# Patient Record
Sex: Female | Born: 1945
Health system: Southern US, Community
[De-identification: ages and names within clinical notes are randomized; demographics above are authoritative.]

## PROBLEM LIST (undated history)

## (undated) DIAGNOSIS — I10 Essential (primary) hypertension: Secondary | ICD-10-CM

## (undated) DIAGNOSIS — I2510 Atherosclerotic heart disease of native coronary artery without angina pectoris: Secondary | ICD-10-CM

## (undated) DIAGNOSIS — E785 Hyperlipidemia, unspecified: Secondary | ICD-10-CM

---

## 2013-09-20 DIAGNOSIS — J019 Acute sinusitis, unspecified: Secondary | ICD-10-CM | POA: Diagnosis not present

## 2014-11-05 DIAGNOSIS — Z08 Encounter for follow-up examination after completed treatment for malignant neoplasm: Secondary | ICD-10-CM | POA: Diagnosis not present

## 2014-11-05 DIAGNOSIS — Z85828 Personal history of other malignant neoplasm of skin: Secondary | ICD-10-CM | POA: Diagnosis not present

## 2015-05-12 DIAGNOSIS — H25043 Posterior subcapsular polar age-related cataract, bilateral: Secondary | ICD-10-CM | POA: Diagnosis not present

## 2019-11-11 DIAGNOSIS — M109 Gout, unspecified: Secondary | ICD-10-CM | POA: Diagnosis not present

## 2020-07-16 ENCOUNTER — Emergency Department (HOSPITAL_COMMUNITY): Payer: Medicare HMO

## 2020-07-16 ENCOUNTER — Other Ambulatory Visit: Payer: Self-pay

## 2020-07-16 ENCOUNTER — Inpatient Hospital Stay (HOSPITAL_COMMUNITY)
Admission: EM | Admit: 2020-07-16 | Discharge: 2020-07-25 | DRG: 232 | Disposition: A | Discharge status: Home / Self Care | Payer: Medicare HMO | Attending: Surgery | Admitting: Surgery

## 2020-07-16 ENCOUNTER — Encounter (HOSPITAL_COMMUNITY): Payer: Self-pay | Admitting: Emergency Medicine

## 2020-07-16 DIAGNOSIS — I248 Other forms of acute ischemic heart disease: Secondary | ICD-10-CM | POA: Diagnosis not present

## 2020-07-16 DIAGNOSIS — Z8249 Family history of ischemic heart disease and other diseases of the circulatory system: Secondary | ICD-10-CM

## 2020-07-16 DIAGNOSIS — E7800 Pure hypercholesterolemia, unspecified: Secondary | ICD-10-CM | POA: Diagnosis present

## 2020-07-16 DIAGNOSIS — Z4682 Encounter for fitting and adjustment of non-vascular catheter: Secondary | ICD-10-CM | POA: Diagnosis not present

## 2020-07-16 DIAGNOSIS — I2109 ST elevation (STEMI) myocardial infarction involving other coronary artery of anterior wall: Secondary | ICD-10-CM | POA: Diagnosis not present

## 2020-07-16 DIAGNOSIS — D62 Acute posthemorrhagic anemia: Secondary | ICD-10-CM | POA: Diagnosis not present

## 2020-07-16 DIAGNOSIS — I25118 Atherosclerotic heart disease of native coronary artery with other forms of angina pectoris: Secondary | ICD-10-CM | POA: Diagnosis present

## 2020-07-16 DIAGNOSIS — E8770 Fluid overload, unspecified: Secondary | ICD-10-CM | POA: Diagnosis not present

## 2020-07-16 DIAGNOSIS — I10 Essential (primary) hypertension: Secondary | ICD-10-CM | POA: Diagnosis not present

## 2020-07-16 DIAGNOSIS — E785 Hyperlipidemia, unspecified: Secondary | ICD-10-CM | POA: Diagnosis not present

## 2020-07-16 DIAGNOSIS — R079 Chest pain, unspecified: Secondary | ICD-10-CM | POA: Diagnosis not present

## 2020-07-16 DIAGNOSIS — I2510 Atherosclerotic heart disease of native coronary artery without angina pectoris: Secondary | ICD-10-CM | POA: Diagnosis not present

## 2020-07-16 DIAGNOSIS — E119 Type 2 diabetes mellitus without complications: Secondary | ICD-10-CM | POA: Diagnosis not present

## 2020-07-16 DIAGNOSIS — D72829 Elevated white blood cell count, unspecified: Secondary | ICD-10-CM | POA: Diagnosis not present

## 2020-07-16 DIAGNOSIS — Z951 Presence of aortocoronary bypass graft: Secondary | ICD-10-CM | POA: Diagnosis not present

## 2020-07-16 DIAGNOSIS — E7801 Familial hypercholesterolemia: Secondary | ICD-10-CM | POA: Diagnosis not present

## 2020-07-16 DIAGNOSIS — Z20822 Contact with and (suspected) exposure to covid-19: Secondary | ICD-10-CM | POA: Diagnosis present

## 2020-07-16 DIAGNOSIS — I214 Non-ST elevation (NSTEMI) myocardial infarction: Secondary | ICD-10-CM

## 2020-07-16 DIAGNOSIS — R0789 Other chest pain: Secondary | ICD-10-CM | POA: Diagnosis not present

## 2020-07-16 DIAGNOSIS — J90 Pleural effusion, not elsewhere classified: Secondary | ICD-10-CM | POA: Diagnosis not present

## 2020-07-16 DIAGNOSIS — J9811 Atelectasis: Secondary | ICD-10-CM | POA: Diagnosis not present

## 2020-07-16 DIAGNOSIS — I25119 Atherosclerotic heart disease of native coronary artery with unspecified angina pectoris: Secondary | ICD-10-CM | POA: Diagnosis not present

## 2020-07-16 DIAGNOSIS — I088 Other rheumatic multiple valve diseases: Secondary | ICD-10-CM | POA: Diagnosis not present

## 2020-07-16 DIAGNOSIS — I517 Cardiomegaly: Secondary | ICD-10-CM | POA: Diagnosis not present

## 2020-07-16 DIAGNOSIS — I959 Hypotension, unspecified: Secondary | ICD-10-CM | POA: Diagnosis not present

## 2020-07-16 DIAGNOSIS — I25110 Atherosclerotic heart disease of native coronary artery with unstable angina pectoris: Secondary | ICD-10-CM | POA: Diagnosis not present

## 2020-07-16 DIAGNOSIS — E781 Pure hyperglyceridemia: Secondary | ICD-10-CM | POA: Diagnosis present

## 2020-07-16 DIAGNOSIS — Z09 Encounter for follow-up examination after completed treatment for conditions other than malignant neoplasm: Secondary | ICD-10-CM

## 2020-07-16 DIAGNOSIS — I2102 ST elevation (STEMI) myocardial infarction involving left anterior descending coronary artery: Secondary | ICD-10-CM | POA: Diagnosis not present

## 2020-07-16 HISTORY — DX: Hyperlipidemia, unspecified: E78.5

## 2020-07-16 HISTORY — DX: Non-ST elevation (NSTEMI) myocardial infarction: I21.4

## 2020-07-16 LAB — COMPREHENSIVE METABOLIC PANEL
ALT: 30 U/L (ref 0–44)
AST: 30 U/L (ref 15–41)
Albumin: 3.7 g/dL (ref 3.5–5.0)
Alkaline Phosphatase: 117 U/L (ref 38–126)
Anion gap: 11 (ref 5–15)
BUN: 12 mg/dL (ref 8–23)
CO2: 21 mmol/L — ABNORMAL LOW (ref 22–32)
Calcium: 8.9 mg/dL (ref 8.9–10.3)
Chloride: 108 mmol/L (ref 98–111)
Creatinine, Ser: 0.94 mg/dL (ref 0.44–1.00)
GFR, Estimated: >60 mL/min (ref >60)
Glucose, Bld: 98 mg/dL (ref 70–99)
Potassium: 3.9 mmol/L (ref 3.5–5.1)
Sodium: 140 mmol/L (ref 135–145)
Total Bilirubin: 0.7 mg/dL (ref 0.3–1.2)
Total Protein: 7.2 g/dL (ref 6.5–8.1)

## 2020-07-16 LAB — TROPONIN I (HIGH SENSITIVITY)
Troponin I (High Sensitivity): 136 ng/L (ref <18)
Troponin I (High Sensitivity): 227 ng/L (ref <18)

## 2020-07-16 LAB — CBC WITH DIFFERENTIAL/PLATELET
Abs Immature Granulocytes: 0.02 10*3/uL (ref 0.00–0.07)
Basophils Absolute: 0 10*3/uL (ref 0.0–0.1)
Basophils Relative: 0 %
Eosinophils Absolute: 0.1 10*3/uL (ref 0.0–0.5)
Eosinophils Relative: 1 %
HCT: 42.9 % (ref 36.0–46.0)
Hemoglobin: 14.5 g/dL (ref 12.0–15.0)
Immature Granulocytes: 0 %
Lymphocytes Relative: 16 %
Lymphs Abs: 1.6 10*3/uL (ref 0.7–4.0)
MCH: 30.2 pg (ref 26.0–34.0)
MCHC: 33.8 g/dL (ref 30.0–36.0)
MCV: 89.4 fL (ref 80.0–100.0)
Monocytes Absolute: 0.6 10*3/uL (ref 0.1–1.0)
Monocytes Relative: 6 %
Neutro Abs: 7.2 10*3/uL (ref 1.7–7.7)
Neutrophils Relative %: 77 %
Platelets: 252 10*3/uL (ref 150–400)
RBC: 4.8 MIL/uL (ref 3.87–5.11)
RDW: 12.9 % (ref 11.5–15.5)
WBC: 9.5 10*3/uL (ref 4.0–10.5)
nRBC: 0 % (ref 0.0–0.2)

## 2020-07-16 LAB — RESP PANEL BY RT-PCR (FLU A&B, COVID) ARPGX2
Influenza A by PCR: NEGATIVE
Influenza B by PCR: NEGATIVE
SARS Coronavirus 2 by RT PCR: NEGATIVE

## 2020-07-16 MED ORDER — NITROGLYCERIN 0.4 MG SL SUBL
0.4000 mg | SUBLINGUAL_TABLET | SUBLINGUAL | Status: DC | PRN
  Administered 2020-07-17 – 2020-07-18 (×6): 0.4 mg via SUBLINGUAL
  Filled 2020-07-16 (×3): qty 1

## 2020-07-16 MED ORDER — NITROGLYCERIN 0.4 MG SL SUBL
0.4000 mg | SUBLINGUAL_TABLET | Freq: Once | SUBLINGUAL | Status: AC
  Administered 2020-07-16: 20:00:00 0.4 mg via SUBLINGUAL
  Filled 2020-07-16: qty 1

## 2020-07-16 MED ORDER — HEPARIN (PORCINE) 25000 UT/250ML-% IV SOLN
900.0000 [IU]/h | INTRAVENOUS | Status: DC
  Administered 2020-07-16 – 2020-07-17 (×2): 900 [IU]/h via INTRAVENOUS
  Filled 2020-07-16 (×2): qty 250

## 2020-07-16 MED ORDER — ACETAMINOPHEN 325 MG PO TABS
650.0000 mg | ORAL_TABLET | ORAL | Status: DC | PRN
  Administered 2020-07-17 (×2): 650 mg via ORAL
  Filled 2020-07-16 (×3): qty 2

## 2020-07-16 MED ORDER — HEPARIN BOLUS VIA INFUSION
4000.0000 [IU] | Freq: Once | INTRAVENOUS | Status: AC
  Administered 2020-07-16: 21:00:00 4000 [IU] via INTRAVENOUS
  Filled 2020-07-16: qty 4000

## 2020-07-16 MED ORDER — METOPROLOL TARTRATE 25 MG PO TABS
25.0000 mg | ORAL_TABLET | Freq: Two times a day (BID) | ORAL | Status: DC
  Administered 2020-07-17 – 2020-07-18 (×3): 25 mg via ORAL
  Filled 2020-07-16 (×4): qty 1

## 2020-07-16 MED ORDER — NITROGLYCERIN 2 % TD OINT
1.0000 [in_us] | TOPICAL_OINTMENT | Freq: Once | TRANSDERMAL | Status: AC
  Administered 2020-07-16: 18:00:00 1 [in_us] via TOPICAL
  Filled 2020-07-16: qty 1

## 2020-07-16 MED ORDER — ATORVASTATIN CALCIUM 80 MG PO TABS
80.0000 mg | ORAL_TABLET | Freq: Every day | ORAL | Status: DC
  Administered 2020-07-16 – 2020-07-25 (×10): 80 mg via ORAL
  Filled 2020-07-16: qty 1
  Filled 2020-07-16: qty 8
  Filled 2020-07-16 (×8): qty 1

## 2020-07-16 MED ORDER — ONDANSETRON HCL 4 MG/2ML IJ SOLN: 4.0000 mg | Freq: Four times a day (QID) | INTRAMUSCULAR | Status: DC | PRN

## 2020-07-16 MED ORDER — ASPIRIN EC 81 MG PO TBEC
81.0000 mg | DELAYED_RELEASE_TABLET | Freq: Every day | ORAL | Status: DC
  Administered 2020-07-17: 10:00:00 81 mg via ORAL
  Filled 2020-07-16: qty 1

## 2020-07-16 NOTE — ED Provider Notes (Signed)
[facility] EMERGENCY DEPARTMENT Provider Note   CSN: [id number] Arrival date & time: 07/16/20  1647     History Chief Complaint  Patient presents with  . Chest Pain    Brenda Wilkins is a 74 y.o. female who presents to the ED today via EMS with complaint of sudden onset, intermittent, left sided chest pain x 2-3 days with persistent chest pain that began around 2 PM today.  Patient reports that for the last 2 to 3 days she has noticed left-sided chest pain with exertion.  She states she went for a walk 2 to 3 days ago and was having some chest pain that dissipated minutes after she stopped walking.  Has pain has become more persistent with exertion however she did not think much of it until today.  She states that while at the grocery store she began having some chest pain and then while taking her groceries to her house the chest pain worsened.  She states that she did feel short of breath when the chest pain for started and had an episode of dry heaving prompting her to call her daughter who then called 911.  She also states she took 2 aspirin prior to EMS getting to her house.  She is unsure if they were baby aspirin or full drinks aspirin.  She was given 2 sublingual nitroglycerin with this and her pain improved to about a 1 out of 10.  Patient has never had issues with chest pain in the past.  She does report that her father passed away of an MI at the age of 42.  Patient is a never smoker.  She denies any history of DVT/PE.  No recent prolonged travel or immobilization.  No hemoptysis.  No active malignancy.  No exogenous hormone use.  Without any other complaints at this time.   The history is provided by the patient, medical records and the EMS personnel.       History reviewed. No pertinent past medical history.  There are no problems to display for this patient.    OB History   No obstetric history on file.     No family history on file.  Social History    Tobacco Use  . Smoking status: Never Smoker  . Smokeless tobacco: Never Used  Substance Use Topics  . Alcohol use: Never  . Drug use: Never    Home Medications Prior to Admission medications   Not on File    Allergies    Patient has no allergy information on record.  Review of Systems   Review of Systems  Constitutional: Negative for chills and fever.  Respiratory: Positive for shortness of breath. Negative for cough.   Cardiovascular: Positive for chest pain. Negative for palpitations and leg swelling.  Gastrointestinal: Positive for nausea.  All other systems reviewed and are negative.   Physical Exam Updated Vital Signs BP (!) 148/73 (BP Location: Right Arm)   Pulse 80   Temp 98.2 F (36.8 C) (Oral)   Resp 17   SpO2 98%   Physical Exam Vitals and nursing note reviewed.  Constitutional:      Appearance: She is not ill-appearing or diaphoretic.  HENT:     Head: Normocephalic and atraumatic.  Eyes:     Conjunctiva/sclera: Conjunctivae normal.  Cardiovascular:     Rate and Rhythm: Normal rate and regular rhythm.     Pulses:          Radial pulses are 2+ on the  right side and 2+ on the left side.       Dorsalis pedis pulses are 2+ on the right side and 2+ on the left side.  Pulmonary:     Effort: Pulmonary effort is normal.     Breath sounds: Normal breath sounds. No decreased breath sounds, wheezing, rhonchi or rales.  Chest:     Chest wall: No tenderness.  Abdominal:     Palpations: Abdomen is soft.     Tenderness: There is no abdominal tenderness. There is no guarding or rebound.  Musculoskeletal:     Cervical back: Neck supple.     Right lower leg: No edema.     Left lower leg: No edema.  Skin:    General: Skin is warm and dry.  Neurological:     Mental Status: She is alert.     ED Results / Procedures / Treatments   Labs (all labs ordered are listed, but only abnormal results are displayed) Labs Reviewed  COMPREHENSIVE METABOLIC PANEL -  Abnormal; Notable for the following components:      Result Value   CO2 21 (*)    All other components within normal limits  TROPONIN I (HIGH SENSITIVITY) - Abnormal; Notable for the following components:   Troponin I (High Sensitivity) 136 (*)    All other components within normal limits  RESP PANEL BY RT-PCR (FLU A&B, COVID) ARPGX2  CBC WITH DIFFERENTIAL/PLATELET  URINALYSIS, ROUTINE W REFLEX MICROSCOPIC  TROPONIN I (HIGH SENSITIVITY)    EKG EKG Interpretation  Date/Time:  Saturday July 16 2020 19:32:49 EST Ventricular Rate:  87 PR Interval:    QRS Duration: 83 QT Interval:  369 QTC Calculation: 444 R Axis:   -49 Text Interpretation: Sinus rhythm Probable left atrial enlargement Left anterior fascicular block Low voltage, precordial leads RSR' in V1 or V2, right VCD or RVH Consider anterior infarct No significant change since last tracing Confirmed by Gwyneth Sprout ([id number]) on 07/16/2020 7:37:05 PM   Radiology DG Chest Port 1 View  Result Date: 07/16/2020 CLINICAL DATA:  Chest pain. EXAM: PORTABLE CHEST 1 VIEW COMPARISON:  None. FINDINGS: The cardiomediastinal contours are normal. There is mild interstitial coarsening of unknown chronicity. Pulmonary vasculature is normal. No consolidation, pleural effusion, or pneumothorax. No acute osseous abnormalities are seen. IMPRESSION: Mild interstitial coarsening of unknown chronicity. This may be chronic or represent atypical infection. Electronically Signed   By: Narda Rutherford M.D.   On: 07/16/2020 17:58    Procedures .Critical Care Performed by: Tanda Rockers, PA-C Authorized by: Tanda Rockers, PA-C   Critical care provider statement:    Critical care time (minutes):  40   Critical care was necessary to treat or prevent imminent or life-threatening deterioration of the following conditions:  Cardiac failure   Critical care was time spent personally by me on the following activities:  Discussions with consultants,  evaluation of patient's response to treatment, examination of patient, ordering and performing treatments and interventions, ordering and review of laboratory studies, ordering and review of radiographic studies, pulse oximetry, re-evaluation of patient's condition, obtaining history from patient or surrogate and review of old charts   (including critical care time)  Medications Ordered in ED Medications  nitroGLYCERIN (NITROGLYN) 2 % ointment 1 inch (1 inch Topical Given 07/16/20 1811)  nitroGLYCERIN (NITROSTAT) SL tablet 0.4 mg (0.4 mg Sublingual Given 07/16/20 1951)    ED Course  I have reviewed the triage vital signs and the nursing notes.  Pertinent labs & imaging results that were  available during my care of the patient were reviewed by me and considered in my medical decision making (see chart for details).  Clinical Course as of 07/16/20 1953  Sat Jul 16, 2020  1930 Troponin I (High Sensitivity)(!!): 136 [MV]    Clinical Course User Index [MV] Tanda Rockers, [place]   MDM Rules/Calculators/A&P                          74 year old female presents to the ED via EMS for intermittent left-sided chest pain for the past 2 to 3 days with exertion, persistent since 2 PM today.  It took 2 aspirin at home without relief, she is unsure if they were baby aspirin or full-strength aspirin.  She was also given 2 sublingual nitroglycerin with relief from an 8 out of 10 to about a 1 out of 10.  No previous history of CAD.  Patient does report family history of CAD with her father passing away at the age of 70.  On arrival to the ED vitals are stable.  Patient is afebrile, nontachycardic nontachypneic and appears to be in no acute distress however she does have a concerning story at this time..  She is still having some pain will apply Nitropaste at this time.  She did not present with an EKG with EMS however an EKG was obtained here which does show some T wave inversions.  We unfortunately do not have  a previous to compare to.  Will work-up for ACS at this time with CBC, CMP, troponin, chest x-ray.    CXR c;ear CBC without leukocytosis. Hgb stable at 14.5.   CMP and troponin delayed - lab called and reports it should result soon. Will await.   Troponin has returned elevated at 136. On reevaluation pt reports her chest pain has increased to a 4/10. Repeat EKG essentially unchanged besides an increase in HR in the 80s. Will provide additional NTG at this time and consult cardiology for admission.   Discussed case with cardiology who will come evaluate patient and admit to their service. Heparin ordered.   This note was prepared using Dragon voice recognition software and may include unintentional dictation errors due to the inherent limitations of voice recognition software.  Final Clinical Impression(s) / ED Diagnoses Final diagnoses:  NSTEMI (non-ST elevated myocardial infarction) [facility])    Rx / DC Orders ED Discharge Orders    None       Tanda Rockers, PA-C 07/16/20 1953    Gwyneth Sprout, MD 07/16/20 2028

## 2020-07-16 NOTE — ED Notes (Signed)
admitting at bedside.  

## 2020-07-16 NOTE — ED Notes (Signed)
Provider at bedside

## 2020-07-16 NOTE — ED Triage Notes (Signed)
Pt to ED via [facility] from home with c/o left chest pain radiating into back with exertion x's 2-3 days.  St's she had pain this am that subsided but returned 3 hours ago and has been constant. Pt took ASA today at home.  EMS gave pt NTG SL x's 2 with relief.

## 2020-07-16 NOTE — ED Notes (Signed)
Attempted to give report, asked to call back.  

## 2020-07-16 NOTE — ED Notes (Signed)
At change of shift, pt teary eyed at bedside, calm, sinus rhythm on ccm. Pt reporting nitro not improving pain, otherwise stable. Call bell in reach, family at bedside, side rails up.

## 2020-07-16 NOTE — H&P (Signed)
Cardiology Admission History and Physical:   Patient ID: Brenda Wilkins MRN: [id number]; DOB: [birth year]   Admission date: 07/16/2020  Primary Care Provider: No primary care provider on file. Primary Cardiologist: No primary care provider on file.  Primary Electrophysiologist:  None   Chief Complaint:  Chest pain  Patient Profile:   Brenda Wilkins is a 74 y.o. female with no known past medical history who presents with typical angina and found to have NSTEMI.  History of Present Illness:   Brenda Wilkins is a 74 yo female with low medical contact in several years and no known medical history who presents today with progressive angina. She was brought in by EMS. Over the last 2-3 days she has developed chest pain that has worsened. It was very typical exertional left sided pain radiating to back and left shoulder. Nonsmoker and doesn't drink. Hasn't seen an MD in some years. Was told in her 35s that she had high cholesterol and was started on statin but made her legs weak and she stopped. Has strong family history of MI and CAD.  In the workup in the ED, she was found to have NSTEMI with 136 -> 227   History reviewed. No pertinent past medical history.  History reviewed. No pertinent surgical history.   Medications Prior to Admission: Prior to Admission medications   Not on File     Allergies:   Not on File  Social History:   Social History   Socioeconomic History  . Marital status: Unknown    Spouse name: Not on file  . Number of children: Not on file  . Years of education: Not on file  . Highest education level: Not on file  Occupational History  . Not on file  Tobacco Use  . Smoking status: Never Smoker  . Smokeless tobacco: Never Used  Substance and Sexual Activity  . Alcohol use: Never  . Drug use: Never  . Sexual activity: Not on file  Other Topics Concern  . Not on file  Social History Narrative  . Not on file   Social Determinants of Health   Financial  Resource Strain: Not on file  Food Insecurity: Not on file  Transportation Needs: Not on file  Physical Activity: Not on file  Stress: Not on file  Social Connections: Not on file  Intimate Partner Violence: Not on file    Family History:   The patient's father, brother, sister all with MI and CAD    Review of Systems: [y] = yes, [ ]  = no     General: Weight gain [ ] ; Weight loss [ ] ; Anorexia [ ] ; Fatigue [ ] ; Fever [ ] ; Chills [ ] ; Weakness [ ]    Cardiac: Chest pain/pressure [x ]; Resting SOB [x ]; Exertional SOB [x ]; Orthopnea [ ] ; Pedal Edema [ ] ; Palpitations [ ] ; Syncope [ ] ; Presyncope [ ] ; Paroxysmal nocturnal dyspnea[ ]    Pulmonary: Cough [ ] ; Wheezing[ ] ; Hemoptysis[ ] ; Sputum [ ] ; Snoring [ ]    GI: Vomiting[ ] ; Dysphagia[ ] ; Melena[ ] ; Hematochezia [ ] ; Heartburn[ ] ; Abdominal pain [ ] ; Constipation [ ] ; Diarrhea [ ] ; BRBPR [ ]    GU: Hematuria[ ] ; Dysuria [ ] ; Nocturia[ ]    Vascular: Pain in legs with walking [ ] ; Pain in feet with lying flat [ ] ; Non-healing sores [ ] ; Stroke [ ] ; TIA [ ] ; Slurred speech [ ] ;   Neuro: Headaches[ ] ; Vertigo[ ] ; Seizures[ ] ; Paresthesias[ ] ;Blurred vision [ ] ; Diplopia [ ] ;  Vision changes [ ]    Ortho/Skin: Arthritis [ ] ; Joint pain [ ] ; Muscle pain [ ] ; Joint swelling [ ] ; Back Pain [ ] ; Rash [ ]    Psych: Depression[ ] ; Anxiety[ ]    Heme: Bleeding problems [ ] ; Clotting disorders [ ] ; Anemia [ ]    Endocrine: Diabetes [ ] ; Thyroid dysfunction[ ]   Physical Exam/Data:   Vitals:   07/16/20 1745 07/16/20 1845 07/16/20 1900 07/16/20 1915  BP: 134/65 (!) 148/75 (!) 155/71 (!) 155/87  Pulse: 69 79 68 72  Resp: 20 19 18 17   Temp:      TempSrc:      SpO2: 97% 97% 96% 97%   No intake or output data in the 24 hours ending 07/16/20 1951 There were no vitals filed for this visit. There is no height or weight on file to calculate BMI.  General:  Elderly female nad HEENT: normal Lymph: no adenopathy Neck: no JVD Endocrine:  No  thryomegaly Vascular: No carotid bruits; FA pulses 2+ bilaterally without bruits  Cardiac:  normal S1, S2; RRR; no murmur  Lungs:  clear to auscultation bilaterally, no wheezing, rhonchi or rales  Abd: soft, nontender, no hepatomegaly  Ext: no edema Musculoskeletal:  No deformities, BUE and BLE strength normal and equal Skin: warm and dry  Neuro:  CNs 2-12 intact, no focal abnormalities noted Psych:  Normal affect    EKG:  The ECG that was done  was personally reviewed and demonstrates nonspecific ST-T wave changes   Relevant CV Studies: none  Laboratory Data:  Chemistry Recent Labs  Lab 07/16/20 1807  NA 140  K 3.9  CL 108  CO2 21*  GLUCOSE 98  BUN 12  CREATININE 0.94  CALCIUM 8.9  GFRNONAA >60  ANIONGAP 11    Recent Labs  Lab 07/16/20 1807  PROT 7.2  ALBUMIN 3.7  AST 30  ALT 30  ALKPHOS 117  BILITOT 0.7   Hematology Recent Labs  Lab 07/16/20 1807  WBC 9.5  RBC 4.80  HGB 14.5  HCT 42.9  MCV 89.4  MCH 30.2  MCHC 33.8  RDW 12.9  PLT 252   Cardiac EnzymesNo results for input(s): TROPONINI in the last 168 hours. No results for input(s): TROPIPOC in the last 168 hours.  BNPNo results for input(s): BNP, PROBNP in the last 168 hours.  DDimer No results for input(s): DDIMER in the last 168 hours.  Radiology/Studies:  DG Chest Port 1 View  Result Date: 07/16/2020 CLINICAL DATA:  Chest pain. EXAM: PORTABLE CHEST 1 VIEW COMPARISON:  None. FINDINGS: The cardiomediastinal contours are normal. There is mild interstitial coarsening of unknown chronicity. Pulmonary vasculature is normal. No consolidation, pleural effusion, or pneumothorax. No acute osseous abnormalities are seen. IMPRESSION: Mild interstitial coarsening of unknown chronicity. This may be chronic or represent atypical infection. Electronically Signed   By: M.D.   On: 07/16/2020 17:58    Assessment and Plan:   1. NSTEMI. Very typical angina that is progressive with elevated,  rising troponin. History of untreated hyperlipidemia and strong family history of CAD. Non smoker. Loaded with asa. Start statin, heparin. Metoprolol. Send lipid panel, a1c, tsh. Ordered echo. Plan for cath Monday.   Severity of Illness: The appropriate patient status for this patient is INPATIENT. Inpatient status is judged to be reasonable and necessary in order to provide the required intensity of service to ensure the patient's safety. The patient's presenting symptoms, physical exam findings, and initial radiographic and laboratory data in the  context of their chronic comorbidities is felt to place them at high risk for further clinical deterioration. Furthermore, it is not anticipated that the patient will be medically stable for discharge from the hospital within 2 midnights of admission. The following factors support the patient status of inpatient.   " The patient's presenting symptoms include chest pain. " The worrisome physical exam findings include chest pain. " The initial radiographic and laboratory data are worrisome because of nstemi. " The chronic co-morbidities include hld.   * I certify that at the point of admission it is my clinical judgment that the patient will require inpatient hospital care spanning beyond 2 midnights from the point of admission due to high intensity of service, high risk for further deterioration and high frequency of surveillance required.*    For questions or updates, please contact [facility] Please consult [email or web address] for contact info under        Signed, Joellen [place], MD  07/16/2020 7:51 PM

## 2020-07-16 NOTE — Progress Notes (Signed)
ANTICOAGULATION CONSULT NOTE - Initial Consult  Pharmacy Consult for heparin  Indication: chest pain/ACS   Vital Signs: Temp: 98.2 F (36.8 C) (12/11 1726) Temp Source: Oral (12/11 1726) BP: 131/63 (12/11 1951) Pulse Rate: 70 (12/11 1951)  Labs: Recent Labs    07/16/20 1807  HGB 14.5  HCT 42.9  PLT 252  CREATININE 0.94  TROPONINIHS 136*    CrCl cannot be calculated (Unknown ideal weight.).   Medical History: History reviewed. No pertinent past medical history.    Assessment: 74 yo female with CP to begin heparin for r/o ACS. No oral anticoagulants noted PTA.    Goal of Therapy:  Heparin level 0.3-0.7 units/ml Monitor platelets by anticoagulation protocol: Yes   Plan:  -Heparin bolus 4000 units IV followed by 900 units/hr  -Heparin level in 8 hours and daily wth CBC daily  Harland German, PharmD Clinical Pharmacist **Pharmacist phone directory can now be found on [email or web address] ([email or web address]).  Listed under [facility].

## 2020-07-17 ENCOUNTER — Inpatient Hospital Stay (HOSPITAL_COMMUNITY): Payer: Medicare HMO

## 2020-07-17 ENCOUNTER — Other Ambulatory Visit: Payer: Self-pay

## 2020-07-17 DIAGNOSIS — I214 Non-ST elevation (NSTEMI) myocardial infarction: Secondary | ICD-10-CM

## 2020-07-17 DIAGNOSIS — I248 Other forms of acute ischemic heart disease: Secondary | ICD-10-CM

## 2020-07-17 LAB — BASIC METABOLIC PANEL
Anion gap: 11 (ref 5–15)
BUN: 15 mg/dL (ref 8–23)
CO2: 21 mmol/L — ABNORMAL LOW (ref 22–32)
Calcium: 8.7 mg/dL — ABNORMAL LOW (ref 8.9–10.3)
Chloride: 107 mmol/L (ref 98–111)
Creatinine, Ser: 0.96 mg/dL (ref 0.44–1.00)
GFR, Estimated: >60 mL/min (ref >60)
Glucose, Bld: 119 mg/dL — ABNORMAL HIGH (ref 70–99)
Potassium: 4 mmol/L (ref 3.5–5.1)
Sodium: 139 mmol/L (ref 135–145)

## 2020-07-17 LAB — CBC
HCT: 42.3 % (ref 36.0–46.0)
Hemoglobin: 13.6 g/dL (ref 12.0–15.0)
MCH: 29 pg (ref 26.0–34.0)
MCHC: 32.2 g/dL (ref 30.0–36.0)
MCV: 90.2 fL (ref 80.0–100.0)
Platelets: 248 10*3/uL (ref 150–400)
RBC: 4.69 MIL/uL (ref 3.87–5.11)
RDW: 13.2 % (ref 11.5–15.5)
WBC: 10.1 10*3/uL (ref 4.0–10.5)
nRBC: 0 % (ref 0.0–0.2)

## 2020-07-17 LAB — LIPID PANEL
Cholesterol: 254 mg/dL — ABNORMAL HIGH (ref 0–200)
HDL: 40 mg/dL — ABNORMAL LOW (ref >40)
LDL Cholesterol: 183 mg/dL — ABNORMAL HIGH (ref 0–99)
Total CHOL/HDL Ratio: 6.4 RATIO
Triglycerides: 153 mg/dL — ABNORMAL HIGH (ref <150)
VLDL: 31 mg/dL (ref 0–40)

## 2020-07-17 LAB — HEMOGLOBIN A1C
Hgb A1c MFr Bld: 5.6 % (ref 4.8–5.6)
Mean Plasma Glucose: 114.02 mg/dL

## 2020-07-17 LAB — ECHOCARDIOGRAM COMPLETE
Area-P 1/2: 2.34 cm2
Height: 60 in
S' Lateral: 2.5 cm
Weight: 2452.8 oz

## 2020-07-17 LAB — HEPARIN LEVEL (UNFRACTIONATED): Heparin Unfractionated: 0.38 IU/mL (ref 0.30–0.70)

## 2020-07-17 LAB — PROTIME-INR
INR: 1 (ref 0.8–1.2)
Prothrombin Time: 12.9 seconds (ref 11.4–15.2)

## 2020-07-17 LAB — TSH: TSH: 2.699 u[IU]/mL (ref 0.350–4.500)

## 2020-07-17 LAB — TROPONIN I (HIGH SENSITIVITY)
Troponin I (High Sensitivity): 321 ng/L (ref <18)
Troponin I (High Sensitivity): 290 ng/L (ref <18)

## 2020-07-17 MED ORDER — SODIUM CHLORIDE 0.9 % WEIGHT BASED INFUSION
1.0000 mL/kg/h | INTRAVENOUS | Status: DC
  Administered 2020-07-18: 06:00:00 1 mL/kg/h via INTRAVENOUS

## 2020-07-17 MED ORDER — SODIUM CHLORIDE 0.9 % WEIGHT BASED INFUSION
3.0000 mL/kg/h | INTRAVENOUS | Status: AC
  Administered 2020-07-18: 04:00:00 3 mL/kg/h via INTRAVENOUS

## 2020-07-17 MED ORDER — ASPIRIN 81 MG PO CHEW
81.0000 mg | CHEWABLE_TABLET | ORAL | Status: AC
  Administered 2020-07-18: 06:00:00 81 mg via ORAL
  Filled 2020-07-17: qty 1

## 2020-07-17 MED ORDER — SODIUM CHLORIDE 0.9 % IV SOLN: 250.0000 mL | INTRAVENOUS | Status: DC | PRN

## 2020-07-17 NOTE — Progress Notes (Signed)
ANTICOAGULATION CONSULT NOTE  Pharmacy Consult for heparin  Indication: chest pain/ACS   Vital Signs: Temp: 97.8 F (36.6 C) (12/12 0234) Temp Source: Oral (12/12 0234) BP: 112/66 (12/12 0234) Pulse Rate: 78 (12/12 0234)  Labs: Recent Labs    07/16/20 1807 07/16/20 1934 07/17/20 1015  HGB 14.5  --  13.6  HCT 42.9  --  42.3  PLT 252  --  248  LABPROT  --   --  12.9  INR  --   --  1.0  HEPARINUNFRC  --   --  0.38  CREATININE 0.94  --  0.96  TROPONINIHS 136* 227*  --     Estimated Creatinine Clearance: 47.1 mL/min (by C-G formula based on SCr of 0.94 mg/dL).   Medical History: Past Medical History:  Diagnosis Date  . Hyperlipidemia       Assessment: 74 yo female with CP to begin heparin for r/o ACS. No oral anticoagulants noted PTA.   Heparin level is therapeutic at 0.38 on 900 units/hr. CBC WNL. No issues with infusion.    Goal of Therapy:  Heparin level 0.3-0.7 units/ml Monitor platelets by anticoagulation protocol: Yes   Plan:  Heparin 900 units/hr  Daily heparin level, CBC  Kinnie Feil, PharmD PGY1 Acute Care Pharmacy Resident Phone: [phone] 07/17/2020 8:23 AM  Please check [email or web address] for unit specific pharmacy phone numbers.

## 2020-07-17 NOTE — Significant Event (Signed)
  Patient is experiencing Back pain radiation to left arm and chest rating pain as 4 on a scale of 1-10. Gave  Nitro times one with pain subsiding within .

## 2020-07-17 NOTE — Progress Notes (Signed)
°   07/17/20 1820  Vitals  BP (!) 144/71  MAP (mmHg) 93  BP Method Automatic  Pulse Rate 75  Pulse Rate Source Monitor  MEWS COLOR  MEWS Score Color Green  Oxygen Therapy  SpO2 98 %  MEWS Score  MEWS Temp 0  MEWS Systolic 0  MEWS Pulse 0  MEWS RR 0  MEWS LOC 0  MEWS Score 0

## 2020-07-17 NOTE — Progress Notes (Signed)
  Echocardiogram 2D Echocardiogram has been performed.  Brenda Wilkins 07/17/2020, 11:51 AM

## 2020-07-17 NOTE — Progress Notes (Signed)
Progress Note  Patient Name: Brenda Wilkins Date of Encounter: 07/17/2020  Primary Cardiologist: New to [facility]  Subjective   74 yo F with remote history of HLD with a strong family history of MI who presents with NSTEMI 07/16/20.  ASA loaded and heparinized.  Patient notes that she is doing well presently with resolution of her angina; but with residual headache.  Relevant interval testing or therapy include an echocardiogram that is pending completion.  No chest pain or pressure.  No SOB and no PND.  No weight gain.  No palpitations.  Inpatient Medications    Scheduled Meds: . aspirin EC  81 mg Oral Daily  . atorvastatin  80 mg Oral Daily  . metoprolol tartrate  25 mg Oral BID   Continuous Infusions: . heparin 900 Units/hr (07/16/20 2100)   PRN Meds: acetaminophen, nitroGLYCERIN, ondansetron (ZOFRAN) IV   Vital Signs    Vitals:   07/16/20 2256 07/16/20 2256 07/17/20 0234 07/17/20 0654  BP:  134/74 112/66   Pulse:  77 78   Resp:  18 17   Temp:  98 F (36.7 C) 97.8 F (36.6 C)   TempSrc:  Oral Oral   SpO2:  100% 97%   Weight: 69 kg   69.5 kg  Height: 5' (1.524 m)       Intake/Output Summary (Last 24 hours) at 07/17/2020 1016 Last data filed at 07/17/2020 0343 Gross per 24 hour  Intake 337.49 ml  Output --  Net 337.49 ml   Filed Weights   07/16/20 2027 07/16/20 2256 07/17/20 0654  Weight: 70.3 kg 69 kg 69.5 kg    Telemetry    SR - Personally Reviewed  ECG    SR 87 LAE - Personally Reviewed  Physical Exam   GEN: No acute distress.   Neck: No JVD Cardiac: RRR, no murmurs, rubs, or gallops; bilateral radial and femoral +2 Respiratory: Clear to auscultation bilaterally. GI: Soft, nontender, non-distended  MS: No edema; No deformity. Neuro:  Nonfocal  Psych: Normal affect   Labs    Chemistry Recent Labs  Lab 07/16/20 1807  NA 140  K 3.9  CL 108  CO2 21*  GLUCOSE 98  BUN 12  CREATININE 0.94  CALCIUM 8.9  PROT 7.2  ALBUMIN 3.7  AST 30   ALT 30  ALKPHOS 117  BILITOT 0.7  GFRNONAA >60  ANIONGAP 11     Hematology Recent Labs  Lab 07/16/20 1807  WBC 9.5  RBC 4.80  HGB 14.5  HCT 42.9  MCV 89.4  MCH 30.2  MCHC 33.8  RDW 12.9  PLT 252   Cardiac Enzymes 136-> 227-> Pending  BNPNo results for input(s): BNP, PROBNP in the last 168 hours.   DDimer No results for input(s): DDIMER in the last 168 hours.   Radiology    DG Chest Port 1 View  Result Date: 07/16/2020 CLINICAL DATA:  Chest pain. EXAM: PORTABLE CHEST 1 VIEW COMPARISON:  None. FINDINGS: The cardiomediastinal contours are normal. There is mild interstitial coarsening of unknown chronicity. Pulmonary vasculature is normal. No consolidation, pleural effusion, or pneumothorax. No acute osseous abnormalities are seen. IMPRESSION: Mild interstitial coarsening of unknown chronicity. This may be chronic or represent atypical infection. Electronically Signed   By: Narda Rutherford M.D.   On: 07/16/2020 17:58    Cardiac Studies   None presently  Patient Profile     74 y.o. female HLD and NSTEMI  Assessment & Plan   NSTEMI  Coronary Artery Disease HLD -  symptomatic  - anatomy: unknown - continue ASA 81 mg; Continue heparin - continue statin, goal LDL < 70; pending TSH, A1c, and Lipids - continue BB  - continue nitrates; if repeated use in hospital, will start Imdur 30 mg PO Daily - post cath will consider low dose ACEi - discussed cardiac rehab  Risks and benefits of cardiac catheterization have been discussed with the patient.  These include bleeding, infection, kidney damage, stroke, heart attack, death.  The patient understands these risks and is willing to proceed.  For questions or updates, please contact [facility] Please consult [email or web address] for contact info under Cardiology/STEMI.      Signed, Christell Constant, MD  07/17/2020, 10:16 AM

## 2020-07-18 ENCOUNTER — Encounter (HOSPITAL_COMMUNITY): Payer: Self-pay | Admitting: Cardiovascular Disease

## 2020-07-18 ENCOUNTER — Inpatient Hospital Stay (HOSPITAL_COMMUNITY)
Admission: EM | Disposition: A | Discharge status: Home / Self Care | Payer: Self-pay | Source: Home / Self Care | Attending: Surgery

## 2020-07-18 ENCOUNTER — Inpatient Hospital Stay (HOSPITAL_COMMUNITY): Payer: Medicare HMO

## 2020-07-18 ENCOUNTER — Inpatient Hospital Stay (HOSPITAL_COMMUNITY): Payer: Medicare HMO | Admitting: Anesthesiology

## 2020-07-18 ENCOUNTER — Encounter (HOSPITAL_COMMUNITY)
Admission: EM | Disposition: A | Discharge status: Home / Self Care | Payer: Self-pay | Source: Home / Self Care | Attending: Surgery

## 2020-07-18 DIAGNOSIS — I2102 ST elevation (STEMI) myocardial infarction involving left anterior descending coronary artery: Secondary | ICD-10-CM

## 2020-07-18 DIAGNOSIS — Z951 Presence of aortocoronary bypass graft: Secondary | ICD-10-CM

## 2020-07-18 DIAGNOSIS — E7801 Familial hypercholesterolemia: Secondary | ICD-10-CM

## 2020-07-18 DIAGNOSIS — I2510 Atherosclerotic heart disease of native coronary artery without angina pectoris: Secondary | ICD-10-CM

## 2020-07-18 DIAGNOSIS — I25110 Atherosclerotic heart disease of native coronary artery with unstable angina pectoris: Secondary | ICD-10-CM

## 2020-07-18 HISTORY — PX: TEE WITHOUT CARDIOVERSION: SHX5443

## 2020-07-18 HISTORY — PX: CORONARY ARTERY BYPASS GRAFT: SHX141

## 2020-07-18 HISTORY — DX: Presence of aortocoronary bypass graft: Z95.1

## 2020-07-18 HISTORY — PX: CORONARY/GRAFT ACUTE MI REVASCULARIZATION: CATH118305

## 2020-07-18 HISTORY — PX: LEFT HEART CATH AND CORONARY ANGIOGRAPHY: CATH118249

## 2020-07-18 LAB — PROTIME-INR
INR: 1.3 — ABNORMAL HIGH (ref 0.8–1.2)
Prothrombin Time: 16.1 seconds — ABNORMAL HIGH (ref 11.4–15.2)

## 2020-07-18 LAB — POCT I-STAT 7, (LYTES, BLD GAS, ICA,H+H)
Acid-Base Excess: 1 mmol/L (ref 0.0–2.0)
Acid-Base Excess: 2 mmol/L (ref 0.0–2.0)
Acid-base deficit: 14 mmol/L — ABNORMAL HIGH (ref 0.0–2.0)
Acid-base deficit: 4 mmol/L — ABNORMAL HIGH (ref 0.0–2.0)
Acid-base deficit: 9 mmol/L — ABNORMAL HIGH (ref 0.0–2.0)
Acid-base deficit: 3 mmol/L — ABNORMAL HIGH (ref 0.0–2.0)
Acid-base deficit: 2 mmol/L (ref 0.0–2.0)
Acid-base deficit: 4 mmol/L — ABNORMAL HIGH (ref 0.0–2.0)
Bicarbonate: 14.5 mmol/L — ABNORMAL LOW (ref 20.0–28.0)
Bicarbonate: 22.1 mmol/L (ref 20.0–28.0)
Bicarbonate: 17.4 mmol/L — ABNORMAL LOW (ref 20.0–28.0)
Bicarbonate: 21.8 mmol/L (ref 20.0–28.0)
Bicarbonate: 22.6 mmol/L (ref 20.0–28.0)
Bicarbonate: 24.2 mmol/L (ref 20.0–28.0)
Bicarbonate: 27.6 mmol/L (ref 20.0–28.0)
Bicarbonate: 27.5 mmol/L (ref 20.0–28.0)
Calcium, Ion: 0.98 mmol/L — ABNORMAL LOW (ref 1.15–1.40)
Calcium, Ion: 1.19 mmol/L (ref 1.15–1.40)
Calcium, Ion: 1.32 mmol/L (ref 1.15–1.40)
Calcium, Ion: 1.03 mmol/L — ABNORMAL LOW (ref 1.15–1.40)
Calcium, Ion: 1.43 mmol/L — ABNORMAL HIGH (ref 1.15–1.40)
Calcium, Ion: 1.34 mmol/L (ref 1.15–1.40)
Calcium, Ion: 1.2 mmol/L (ref 1.15–1.40)
Calcium, Ion: 1.17 mmol/L (ref 1.15–1.40)
HCT: 45 % (ref 36.0–46.0)
HCT: 38 % (ref 36.0–46.0)
HCT: 24 % — ABNORMAL LOW (ref 36.0–46.0)
HCT: 25 % — ABNORMAL LOW (ref 36.0–46.0)
HCT: 26 % — ABNORMAL LOW (ref 36.0–46.0)
HCT: 36 % (ref 36.0–46.0)
HCT: 34 % — ABNORMAL LOW (ref 36.0–46.0)
HCT: 33 % — ABNORMAL LOW (ref 36.0–46.0)
Hemoglobin: 15.3 g/dL — ABNORMAL HIGH (ref 12.0–15.0)
Hemoglobin: 12.9 g/dL (ref 12.0–15.0)
Hemoglobin: 8.2 g/dL — ABNORMAL LOW (ref 12.0–15.0)
Hemoglobin: 8.5 g/dL — ABNORMAL LOW (ref 12.0–15.0)
Hemoglobin: 8.8 g/dL — ABNORMAL LOW (ref 12.0–15.0)
Hemoglobin: 12.2 g/dL (ref 12.0–15.0)
Hemoglobin: 11.6 g/dL — ABNORMAL LOW (ref 12.0–15.0)
Hemoglobin: 11.2 g/dL — ABNORMAL LOW (ref 12.0–15.0)
O2 Saturation: 91 %
O2 Saturation: 95 %
O2 Saturation: 100 %
O2 Saturation: 100 %
O2 Saturation: 100 %
O2 Saturation: 96 %
O2 Saturation: 99 %
O2 Saturation: 100 %
Patient temperature: 36.2
Patient temperature: 37.4
Patient temperature: 38.3
Potassium: 2.8 mmol/L — ABNORMAL LOW (ref 3.5–5.1)
Potassium: 4.2 mmol/L (ref 3.5–5.1)
Potassium: 3.9 mmol/L (ref 3.5–5.1)
Potassium: 4.3 mmol/L (ref 3.5–5.1)
Potassium: 4.1 mmol/L (ref 3.5–5.1)
Potassium: 4 mmol/L (ref 3.5–5.1)
Potassium: 4.9 mmol/L (ref 3.5–5.1)
Potassium: 5.1 mmol/L (ref 3.5–5.1)
Sodium: 103 mmol/L — CL (ref 135–145)
Sodium: 140 mmol/L (ref 135–145)
Sodium: 135 mmol/L (ref 135–145)
Sodium: 138 mmol/L (ref 135–145)
Sodium: 141 mmol/L (ref 135–145)
Sodium: 143 mmol/L (ref 135–145)
Sodium: 143 mmol/L (ref 135–145)
Sodium: 142 mmol/L (ref 135–145)
TCO2: 16 mmol/L — ABNORMAL LOW (ref 22–32)
TCO2: 24 mmol/L (ref 22–32)
TCO2: 19 mmol/L — ABNORMAL LOW (ref 22–32)
TCO2: 23 mmol/L (ref 22–32)
TCO2: 24 mmol/L (ref 22–32)
TCO2: 26 mmol/L (ref 22–32)
TCO2: 29 mmol/L (ref 22–32)
TCO2: 29 mmol/L (ref 22–32)
pCO2 arterial: 40.4 mmHg (ref 32.0–48.0)
pCO2 arterial: 45.7 mmHg (ref 32.0–48.0)
pCO2 arterial: 38.5 mmHg (ref 32.0–48.0)
pCO2 arterial: 36.8 mmHg (ref 32.0–48.0)
pCO2 arterial: 37.5 mmHg (ref 32.0–48.0)
pCO2 arterial: 58.6 mmHg — ABNORMAL HIGH (ref 32.0–48.0)
pCO2 arterial: 55.7 mmHg — ABNORMAL HIGH (ref 32.0–48.0)
pCO2 arterial: 50.4 mmHg — ABNORMAL HIGH (ref 32.0–48.0)
pH, Arterial: 7.164 — CL (ref 7.350–7.450)
pH, Arterial: 7.293 — ABNORMAL LOW (ref 7.350–7.450)
pH, Arterial: 7.263 — ABNORMAL LOW (ref 7.350–7.450)
pH, Arterial: 7.381 (ref 7.350–7.450)
pH, Arterial: 7.388 (ref 7.350–7.450)
pH, Arterial: 7.22 — ABNORMAL LOW (ref 7.350–7.450)
pH, Arterial: 7.305 — ABNORMAL LOW (ref 7.350–7.450)
pH, Arterial: 7.35 (ref 7.350–7.450)
pO2, Arterial: 78 mmHg — ABNORMAL LOW (ref 83.0–108.0)
pO2, Arterial: 82 mmHg — ABNORMAL LOW (ref 83.0–108.0)
pO2, Arterial: 357 mmHg — ABNORMAL HIGH (ref 83.0–108.0)
pO2, Arterial: 355 mmHg — ABNORMAL HIGH (ref 83.0–108.0)
pO2, Arterial: 343 mmHg — ABNORMAL HIGH (ref 83.0–108.0)
pO2, Arterial: 100 mmHg (ref 83.0–108.0)
pO2, Arterial: 145 mmHg — ABNORMAL HIGH (ref 83.0–108.0)
pO2, Arterial: 230 mmHg — ABNORMAL HIGH (ref 83.0–108.0)

## 2020-07-18 LAB — CBC
HCT: 40.6 % (ref 36.0–46.0)
HCT: 36.8 % (ref 36.0–46.0)
Hemoglobin: 13.4 g/dL (ref 12.0–15.0)
Hemoglobin: 11.6 g/dL — ABNORMAL LOW (ref 12.0–15.0)
MCH: 29.8 pg (ref 26.0–34.0)
MCH: 29.1 pg (ref 26.0–34.0)
MCHC: 33 g/dL (ref 30.0–36.0)
MCHC: 31.5 g/dL (ref 30.0–36.0)
MCV: 90.4 fL (ref 80.0–100.0)
MCV: 92.5 fL (ref 80.0–100.0)
Platelets: 251 10*3/uL (ref 150–400)
Platelets: 163 10*3/uL (ref 150–400)
RBC: 4.49 MIL/uL (ref 3.87–5.11)
RBC: 3.98 MIL/uL (ref 3.87–5.11)
RDW: 13.2 % (ref 11.5–15.5)
RDW: 13.1 % (ref 11.5–15.5)
WBC: 8.5 10*3/uL (ref 4.0–10.5)
WBC: 23.1 10*3/uL — ABNORMAL HIGH (ref 4.0–10.5)
nRBC: 0 % (ref 0.0–0.2)
nRBC: 0 % (ref 0.0–0.2)

## 2020-07-18 LAB — POCT I-STAT, CHEM 8
BUN: 14 mg/dL (ref 8–23)
BUN: 13 mg/dL (ref 8–23)
BUN: 13 mg/dL (ref 8–23)
BUN: 12 mg/dL (ref 8–23)
BUN: 12 mg/dL (ref 8–23)
Calcium, Ion: 1.17 mmol/L (ref 1.15–1.40)
Calcium, Ion: 1.05 mmol/L — ABNORMAL LOW (ref 1.15–1.40)
Calcium, Ion: 1.15 mmol/L (ref 1.15–1.40)
Calcium, Ion: 1.08 mmol/L — ABNORMAL LOW (ref 1.15–1.40)
Calcium, Ion: 1.41 mmol/L — ABNORMAL HIGH (ref 1.15–1.40)
Chloride: 106 mmol/L (ref 98–111)
Chloride: 107 mmol/L (ref 98–111)
Chloride: 102 mmol/L (ref 98–111)
Chloride: 103 mmol/L (ref 98–111)
Chloride: 105 mmol/L (ref 98–111)
Creatinine, Ser: 0.8 mg/dL (ref 0.44–1.00)
Creatinine, Ser: 0.6 mg/dL (ref 0.44–1.00)
Creatinine, Ser: 0.8 mg/dL (ref 0.44–1.00)
Creatinine, Ser: 0.7 mg/dL (ref 0.44–1.00)
Creatinine, Ser: 0.7 mg/dL (ref 0.44–1.00)
Glucose, Bld: 129 mg/dL — ABNORMAL HIGH (ref 70–99)
Glucose, Bld: 120 mg/dL — ABNORMAL HIGH (ref 70–99)
Glucose, Bld: 221 mg/dL — ABNORMAL HIGH (ref 70–99)
Glucose, Bld: 163 mg/dL — ABNORMAL HIGH (ref 70–99)
Glucose, Bld: 176 mg/dL — ABNORMAL HIGH (ref 70–99)
HCT: 42 % (ref 36.0–46.0)
HCT: 30 % — ABNORMAL LOW (ref 36.0–46.0)
HCT: 28 % — ABNORMAL LOW (ref 36.0–46.0)
HCT: 27 % — ABNORMAL LOW (ref 36.0–46.0)
HCT: 29 % — ABNORMAL LOW (ref 36.0–46.0)
Hemoglobin: 14.3 g/dL (ref 12.0–15.0)
Hemoglobin: 10.2 g/dL — ABNORMAL LOW (ref 12.0–15.0)
Hemoglobin: 9.5 g/dL — ABNORMAL LOW (ref 12.0–15.0)
Hemoglobin: 9.2 g/dL — ABNORMAL LOW (ref 12.0–15.0)
Hemoglobin: 9.9 g/dL — ABNORMAL LOW (ref 12.0–15.0)
Potassium: 4.2 mmol/L (ref 3.5–5.1)
Potassium: 4.2 mmol/L (ref 3.5–5.1)
Potassium: 4.5 mmol/L (ref 3.5–5.1)
Potassium: 4.1 mmol/L (ref 3.5–5.1)
Potassium: 4.4 mmol/L (ref 3.5–5.1)
Sodium: 141 mmol/L (ref 135–145)
Sodium: 141 mmol/L (ref 135–145)
Sodium: 137 mmol/L (ref 135–145)
Sodium: 139 mmol/L (ref 135–145)
Sodium: 140 mmol/L (ref 135–145)
TCO2: 21 mmol/L — ABNORMAL LOW (ref 22–32)
TCO2: 19 mmol/L — ABNORMAL LOW (ref 22–32)
TCO2: 23 mmol/L (ref 22–32)
TCO2: 21 mmol/L — ABNORMAL LOW (ref 22–32)
TCO2: 22 mmol/L (ref 22–32)

## 2020-07-18 LAB — TROPONIN I (HIGH SENSITIVITY): Troponin I (High Sensitivity): 174 ng/L (ref <18)

## 2020-07-18 LAB — POCT I-STAT EG7
Acid-base deficit: 5 mmol/L — ABNORMAL HIGH (ref 0.0–2.0)
Bicarbonate: 21.4 mmol/L (ref 20.0–28.0)
Calcium, Ion: 1.23 mmol/L (ref 1.15–1.40)
HCT: 24 % — ABNORMAL LOW (ref 36.0–46.0)
Hemoglobin: 8.2 g/dL — ABNORMAL LOW (ref 12.0–15.0)
O2 Saturation: 73 %
Potassium: 3.7 mmol/L (ref 3.5–5.1)
Sodium: 138 mmol/L (ref 135–145)
TCO2: 23 mmol/L (ref 22–32)
pCO2, Ven: 46.5 mmHg (ref 44.0–60.0)
pH, Ven: 7.27 (ref 7.250–7.430)
pO2, Ven: 44 mmHg (ref 32.0–45.0)

## 2020-07-18 LAB — POCT ACTIVATED CLOTTING TIME
Activated Clotting Time: 142 seconds
Activated Clotting Time: 226 seconds
Activated Clotting Time: 273 seconds

## 2020-07-18 LAB — GLUCOSE, CAPILLARY
Glucose-Capillary: 146 mg/dL — ABNORMAL HIGH (ref 70–99)
Glucose-Capillary: 134 mg/dL — ABNORMAL HIGH (ref 70–99)

## 2020-07-18 LAB — ABO/RH: ABO/RH(D): B POS

## 2020-07-18 LAB — PREPARE RBC (CROSSMATCH)

## 2020-07-18 LAB — APTT: aPTT: 37 seconds — ABNORMAL HIGH (ref 24–36)

## 2020-07-18 LAB — PLATELET COUNT: Platelets: 109 10*3/uL — ABNORMAL LOW (ref 150–400)

## 2020-07-18 LAB — HEMOGLOBIN AND HEMATOCRIT, BLOOD
HCT: 27.1 % — ABNORMAL LOW (ref 36.0–46.0)
Hemoglobin: 8.7 g/dL — ABNORMAL LOW (ref 12.0–15.0)

## 2020-07-18 LAB — MRSA PCR SCREENING: MRSA by PCR: NEGATIVE

## 2020-07-18 LAB — HEPARIN LEVEL (UNFRACTIONATED): Heparin Unfractionated: 0.32 IU/mL (ref 0.30–0.70)

## 2020-07-18 SURGERY — CORONARY ARTERY BYPASS GRAFTING (CABG)
Anesthesia: General | Site: Chest

## 2020-07-18 SURGERY — LEFT HEART CATH AND CORONARY ANGIOGRAPHY
Anesthesia: LOCAL

## 2020-07-18 SURGERY — CORONARY/GRAFT ACUTE MI REVASCULARIZATION
Anesthesia: LOCAL

## 2020-07-18 MED ORDER — MILRINONE LACTATE IN DEXTROSE 20-5 MG/100ML-% IV SOLN
0.3000 ug/kg/min | INTRAVENOUS | Status: DC
  Filled 2020-07-18: qty 100

## 2020-07-18 MED ORDER — MORPHINE SULFATE (PF) 2 MG/ML IV SOLN
1.0000 mg | INTRAVENOUS | Status: DC | PRN
  Administered 2020-07-18 – 2020-07-19 (×2): 2 mg via INTRAVENOUS
  Filled 2020-07-18 (×2): qty 1

## 2020-07-18 MED ORDER — VASOPRESSIN 20 UNIT/ML IV SOLN
INTRAVENOUS | Status: AC
  Filled 2020-07-18: qty 1

## 2020-07-18 MED ORDER — LABETALOL HCL 5 MG/ML IV SOLN: 10.0000 mg | INTRAVENOUS | Status: DC | PRN

## 2020-07-18 MED ORDER — 0.9 % SODIUM CHLORIDE (POUR BTL) OPTIME
TOPICAL | Status: DC | PRN
  Administered 2020-07-18: 16:00:00 5000 mL

## 2020-07-18 MED ORDER — SODIUM CHLORIDE 0.9 % IV SOLN
1.5000 g | INTRAVENOUS | Status: AC
  Administered 2020-07-18: 16:00:00 1.5 g via INTRAVENOUS
  Filled 2020-07-18: qty 1.5

## 2020-07-18 MED ORDER — SODIUM CHLORIDE 0.9 % IV SOLN
1.5000 g | Freq: Two times a day (BID) | INTRAVENOUS | Status: AC
  Administered 2020-07-18 – 2020-07-20 (×4): 1.5 g via INTRAVENOUS
  Filled 2020-07-18 (×4): qty 1.5

## 2020-07-18 MED ORDER — MIDAZOLAM HCL 2 MG/2ML IJ SOLN
2.0000 mg | INTRAMUSCULAR | Status: DC | PRN
  Administered 2020-07-18: 23:00:00 1 mg via INTRAVENOUS
  Filled 2020-07-18: qty 2

## 2020-07-18 MED ORDER — DEXTROSE 50 % IV SOLN: 0.0000 mL | INTRAVENOUS | Status: DC | PRN

## 2020-07-18 MED ORDER — SODIUM CHLORIDE 0.9 % WEIGHT BASED INFUSION
1.0000 mL/kg/h | INTRAVENOUS | Status: DC
  Administered 2020-07-18: 11:00:00 1 mL/kg/h via INTRAVENOUS

## 2020-07-18 MED ORDER — BISACODYL 5 MG PO TBEC
10.0000 mg | DELAYED_RELEASE_TABLET | Freq: Every day | ORAL | Status: DC
  Administered 2020-07-19 – 2020-07-22 (×4): 10 mg via ORAL
  Filled 2020-07-18 (×4): qty 2

## 2020-07-18 MED ORDER — MORPHINE SULFATE (PF) 2 MG/ML IV SOLN
1.0000 mg | Freq: Once | INTRAVENOUS | Status: AC
  Administered 2020-07-18: 1 mg via INTRAVENOUS

## 2020-07-18 MED ORDER — EPINEPHRINE HCL 5 MG/250ML IV SOLN IN NS
0.0000 ug/min | INTRAVENOUS | Status: DC
  Filled 2020-07-18: qty 250

## 2020-07-18 MED ORDER — CHLORHEXIDINE GLUCONATE 0.12 % MT SOLN
15.0000 mL | OROMUCOSAL | Status: AC
  Administered 2020-07-18: 21:00:00 15 mL via OROMUCOSAL

## 2020-07-18 MED ORDER — THROMBIN 20000 UNITS EX SOLR
OROMUCOSAL | Status: DC | PRN
  Administered 2020-07-18: 16:00:00 12 mL via TOPICAL

## 2020-07-18 MED ORDER — MORPHINE SULFATE (PF) 4 MG/ML IV SOLN: 4.0000 mg | Freq: Once | INTRAVENOUS | Status: AC

## 2020-07-18 MED ORDER — MAGNESIUM SULFATE 4 GM/100ML IV SOLN
4.0000 g | Freq: Once | INTRAVENOUS | Status: AC
  Administered 2020-07-18: 21:00:00 4 g via INTRAVENOUS
  Filled 2020-07-18: qty 100

## 2020-07-18 MED ORDER — NITROGLYCERIN IN D5W 200-5 MCG/ML-% IV SOLN
INTRAVENOUS | Status: AC
  Filled 2020-07-18: qty 250

## 2020-07-18 MED ORDER — FENTANYL CITRATE (PF) 250 MCG/5ML IJ SOLN
INTRAMUSCULAR | Status: AC
  Filled 2020-07-18: qty 30

## 2020-07-18 MED ORDER — SODIUM CHLORIDE 0.9 % IV SOLN: 250.0000 mL | INTRAVENOUS | Status: DC

## 2020-07-18 MED ORDER — METOPROLOL TARTRATE 25 MG/10 ML ORAL SUSPENSION: 12.5000 mg | Freq: Two times a day (BID) | ORAL | Status: DC

## 2020-07-18 MED ORDER — THROMBIN 20000 UNITS EX SOLR
CUTANEOUS | Status: DC | PRN
  Administered 2020-07-18: 16:00:00 20000 [IU] via TOPICAL

## 2020-07-18 MED ORDER — LACTATED RINGERS IV SOLN: INTRAVENOUS | Status: DC | PRN

## 2020-07-18 MED ORDER — CALCIUM CHLORIDE 10 % IV SOLN
INTRAVENOUS | Status: DC | PRN
  Administered 2020-07-18 (×4): 100 mg via INTRAVENOUS

## 2020-07-18 MED ORDER — DOPAMINE-DEXTROSE 3.2-5 MG/ML-% IV SOLN
INTRAVENOUS | Status: DC | PRN
  Administered 2020-07-18: 2 ug/kg/min via INTRAVENOUS

## 2020-07-18 MED ORDER — PROPOFOL 10 MG/ML IV BOLUS
INTRAVENOUS | Status: DC | PRN
  Administered 2020-07-18: 50 mg via INTRAVENOUS

## 2020-07-18 MED ORDER — PANTOPRAZOLE SODIUM 40 MG PO TBEC
40.0000 mg | DELAYED_RELEASE_TABLET | Freq: Every day | ORAL | Status: DC
  Administered 2020-07-20 – 2020-07-22 (×3): 40 mg via ORAL
  Filled 2020-07-18 (×4): qty 1

## 2020-07-18 MED ORDER — POTASSIUM CHLORIDE 2 MEQ/ML IV SOLN
80.0000 meq | INTRAVENOUS | Status: DC
  Filled 2020-07-18: qty 40

## 2020-07-18 MED ORDER — MIDAZOLAM HCL (PF) 10 MG/2ML IJ SOLN
INTRAMUSCULAR | Status: AC
  Filled 2020-07-18: qty 2

## 2020-07-18 MED ORDER — EPINEPHRINE 1 MG/10ML IJ SOSY
PREFILLED_SYRINGE | INTRAMUSCULAR | Status: DC | PRN
  Administered 2020-07-18 (×4): .1 mg via INTRAVENOUS

## 2020-07-18 MED ORDER — METOPROLOL TARTRATE 5 MG/5ML IV SOLN: 2.5000 mg | INTRAVENOUS | Status: DC | PRN

## 2020-07-18 MED ORDER — VERAPAMIL HCL 2.5 MG/ML IV SOLN
INTRAVENOUS | Status: DC | PRN
  Administered 2020-07-18: 09:00:00 10 mL via INTRA_ARTERIAL

## 2020-07-18 MED ORDER — SODIUM CHLORIDE 0.9 % IV SOLN
750.0000 mg | INTRAVENOUS | Status: AC
  Administered 2020-07-18: 19:00:00 750 mg via INTRAVENOUS
  Filled 2020-07-18: qty 750

## 2020-07-18 MED ORDER — PHENYLEPHRINE HCL-NACL 20-0.9 MG/250ML-% IV SOLN
30.0000 ug/min | INTRAVENOUS | Status: AC
  Administered 2020-07-18 (×2): 25 ug/min via INTRAVENOUS
  Filled 2020-07-18: qty 250

## 2020-07-18 MED ORDER — MORPHINE SULFATE (PF) 2 MG/ML IV SOLN
1.0000 mg | Freq: Once | INTRAVENOUS | Status: AC
  Administered 2020-07-18: 14:00:00 1 mg via INTRAVENOUS
  Filled 2020-07-18: qty 1

## 2020-07-18 MED ORDER — SODIUM CHLORIDE 0.9% FLUSH: 3.0000 mL | INTRAVENOUS | Status: DC | PRN

## 2020-07-18 MED ORDER — SODIUM CHLORIDE 0.45 % IV SOLN: INTRAVENOUS | Status: DC | PRN

## 2020-07-18 MED ORDER — IOHEXOL 350 MG/ML SOLN
INTRAVENOUS | Status: DC | PRN
  Administered 2020-07-18: 10:00:00 35 mL

## 2020-07-18 MED ORDER — VANCOMYCIN HCL 1250 MG/250ML IV SOLN
1250.0000 mg | INTRAVENOUS | Status: AC
  Administered 2020-07-18: 16:00:00 1500 mg via INTRAVENOUS
  Filled 2020-07-18: qty 250

## 2020-07-18 MED ORDER — VERAPAMIL HCL 2.5 MG/ML IV SOLN
INTRAVENOUS | Status: AC
  Filled 2020-07-18: qty 2

## 2020-07-18 MED ORDER — ONDANSETRON HCL 4 MG/2ML IJ SOLN
4.0000 mg | Freq: Four times a day (QID) | INTRAMUSCULAR | Status: DC | PRN
  Administered 2020-07-20: 13:00:00 4 mg via INTRAVENOUS
  Filled 2020-07-18 (×2): qty 2

## 2020-07-18 MED ORDER — PLASMA-LYTE 148 IV SOLN
INTRAVENOUS | Status: DC
  Filled 2020-07-18: qty 2.5

## 2020-07-18 MED ORDER — LIDOCAINE HCL (PF) 1 % IJ SOLN
INTRAMUSCULAR | Status: DC | PRN
  Administered 2020-07-18: 2 mL

## 2020-07-18 MED ORDER — THROMBIN (RECOMBINANT) 20000 UNITS EX SOLR
CUTANEOUS | Status: AC
  Filled 2020-07-18: qty 20000

## 2020-07-18 MED ORDER — DOCUSATE SODIUM 100 MG PO CAPS
200.0000 mg | ORAL_CAPSULE | Freq: Every day | ORAL | Status: DC
  Administered 2020-07-19 – 2020-07-22 (×4): 200 mg via ORAL
  Filled 2020-07-18 (×4): qty 2

## 2020-07-18 MED ORDER — SODIUM CHLORIDE 0.9 % IV SOLN
250.0000 mL | INTRAVENOUS | Status: DC | PRN
Start: 2020-07-18 — End: 2020-07-18

## 2020-07-18 MED ORDER — IOHEXOL 350 MG/ML SOLN
INTRAVENOUS | Status: DC | PRN
  Administered 2020-07-18: 15:00:00 100 mL

## 2020-07-18 MED ORDER — HEPARIN SODIUM (PORCINE) 1000 UNIT/ML IJ SOLN
INTRAMUSCULAR | Status: DC | PRN
  Administered 2020-07-18: 5000 [IU] via INTRAVENOUS
  Administered 2020-07-18: 3000 [IU] via INTRAVENOUS

## 2020-07-18 MED ORDER — HYDRALAZINE HCL 20 MG/ML IJ SOLN
10.0000 mg | INTRAMUSCULAR | Status: DC | PRN
Start: 2020-07-18 — End: 2020-07-18

## 2020-07-18 MED ORDER — MIDAZOLAM HCL 2 MG/2ML IJ SOLN
INTRAMUSCULAR | Status: AC
  Filled 2020-07-18: qty 2

## 2020-07-18 MED ORDER — SODIUM CHLORIDE 0.9% FLUSH: 3.0000 mL | Freq: Two times a day (BID) | INTRAVENOUS | Status: DC

## 2020-07-18 MED ORDER — MILRINONE LACTATE IN DEXTROSE 20-5 MG/100ML-% IV SOLN: 0.1250 ug/kg/min | INTRAVENOUS | Status: DC

## 2020-07-18 MED ORDER — ACETAMINOPHEN 650 MG RE SUPP: 650.0000 mg | Freq: Once | RECTAL | Status: AC

## 2020-07-18 MED ORDER — INSULIN REGULAR(HUMAN) IN NACL 100-0.9 UT/100ML-% IV SOLN: INTRAVENOUS | Status: DC

## 2020-07-18 MED ORDER — FENTANYL CITRATE (PF) 100 MCG/2ML IJ SOLN
INTRAMUSCULAR | Status: DC | PRN
  Administered 2020-07-18: 25 ug via INTRAVENOUS

## 2020-07-18 MED ORDER — TRAMADOL HCL 50 MG PO TABS: 50.0000 mg | ORAL_TABLET | ORAL | Status: DC | PRN

## 2020-07-18 MED ORDER — PHENYLEPHRINE HCL-NACL 20-0.9 MG/250ML-% IV SOLN
0.0000 ug/min | INTRAVENOUS | Status: DC
  Administered 2020-07-19: 12:00:00 25 ug/min via INTRAVENOUS
  Administered 2020-07-19: 03:00:00 40 ug/min via INTRAVENOUS
  Filled 2020-07-18 (×2): qty 250

## 2020-07-18 MED ORDER — METOPROLOL TARTRATE 12.5 MG HALF TABLET: 12.5000 mg | ORAL_TABLET | Freq: Two times a day (BID) | ORAL | Status: DC

## 2020-07-18 MED ORDER — HEPARIN SODIUM (PORCINE) 1000 UNIT/ML IJ SOLN
INTRAMUSCULAR | Status: DC | PRN
  Administered 2020-07-18: 4000 [IU] via INTRAVENOUS

## 2020-07-18 MED ORDER — INSULIN REGULAR(HUMAN) IN NACL 100-0.9 UT/100ML-% IV SOLN
INTRAVENOUS | Status: AC
  Administered 2020-07-18: 16:00:00 1.1 [IU]/h via INTRAVENOUS
  Filled 2020-07-18: qty 100

## 2020-07-18 MED ORDER — NOREPINEPHRINE 4 MG/250ML-% IV SOLN
0.0000 ug/min | INTRAVENOUS | Status: DC
  Filled 2020-07-18: qty 250

## 2020-07-18 MED ORDER — DEXMEDETOMIDINE HCL IN NACL 400 MCG/100ML IV SOLN
0.0000 ug/kg/h | INTRAVENOUS | Status: DC
  Administered 2020-07-19: 0.7 ug/kg/h via INTRAVENOUS
  Filled 2020-07-18: qty 100

## 2020-07-18 MED ORDER — LACTATED RINGERS IV SOLN: INTRAVENOUS | Status: DC

## 2020-07-18 MED ORDER — SODIUM CHLORIDE 0.9 % IV SOLN: INTRAVENOUS | Status: DC

## 2020-07-18 MED ORDER — NITROGLYCERIN 1 MG/10 ML FOR IR/CATH LAB
INTRA_ARTERIAL | Status: AC
  Filled 2020-07-18: qty 10

## 2020-07-18 MED ORDER — ALBUMIN HUMAN 5 % IV SOLN: INTRAVENOUS | Status: DC | PRN

## 2020-07-18 MED ORDER — FAMOTIDINE IN NACL 20-0.9 MG/50ML-% IV SOLN
20.0000 mg | Freq: Two times a day (BID) | INTRAVENOUS | Status: AC
  Administered 2020-07-18 – 2020-07-19 (×2): 20 mg via INTRAVENOUS
  Filled 2020-07-18 (×4): qty 50

## 2020-07-18 MED ORDER — SODIUM CHLORIDE 0.9% FLUSH
3.0000 mL | Freq: Two times a day (BID) | INTRAVENOUS | Status: DC
  Administered 2020-07-19 – 2020-07-22 (×6): 3 mL via INTRAVENOUS

## 2020-07-18 MED ORDER — SODIUM CHLORIDE 0.9 % IV SOLN: INTRAVENOUS | Status: DC | PRN

## 2020-07-18 MED ORDER — PHENYLEPHRINE 40 MCG/ML (10ML) SYRINGE FOR IV PUSH (FOR BLOOD PRESSURE SUPPORT)
PREFILLED_SYRINGE | INTRAVENOUS | Status: DC | PRN
  Administered 2020-07-18 (×3): 200 ug via INTRAVENOUS

## 2020-07-18 MED ORDER — ETOMIDATE 2 MG/ML IV SOLN
INTRAVENOUS | Status: AC
  Filled 2020-07-18: qty 10

## 2020-07-18 MED ORDER — OXYCODONE HCL 5 MG PO TABS
5.0000 mg | ORAL_TABLET | ORAL | Status: DC | PRN
  Administered 2020-07-19 – 2020-07-21 (×2): 10 mg via ORAL
  Filled 2020-07-18 (×2): qty 2

## 2020-07-18 MED ORDER — MIDAZOLAM HCL 5 MG/5ML IJ SOLN
INTRAMUSCULAR | Status: DC | PRN
  Administered 2020-07-18 (×3): 2 mg via INTRAVENOUS

## 2020-07-18 MED ORDER — CHLORHEXIDINE GLUCONATE CLOTH 2 % EX PADS
6.0000 | MEDICATED_PAD | Freq: Every day | CUTANEOUS | Status: DC
  Administered 2020-07-18 – 2020-07-21 (×4): 6 via TOPICAL

## 2020-07-18 MED ORDER — HEPARIN (PORCINE) IN NACL 1000-0.9 UT/500ML-% IV SOLN
INTRAVENOUS | Status: AC
  Filled 2020-07-18: qty 1000

## 2020-07-18 MED ORDER — TRANEXAMIC ACID 1000 MG/10ML IV SOLN
1.5000 mg/kg/h | INTRAVENOUS | Status: AC
  Administered 2020-07-18: 16:00:00 1.5 mg/kg/h via INTRAVENOUS
  Filled 2020-07-18: qty 25

## 2020-07-18 MED ORDER — HEPARIN (PORCINE) IN NACL 1000-0.9 UT/500ML-% IV SOLN
INTRAVENOUS | Status: DC | PRN
  Administered 2020-07-18 (×2): 500 mL

## 2020-07-18 MED ORDER — PLASMA-LYTE 148 IV SOLN
INTRAVENOUS | Status: DC | PRN
  Administered 2020-07-18: 16:00:00 500 mL

## 2020-07-18 MED ORDER — ROCURONIUM BROMIDE 10 MG/ML (PF) SYRINGE
PREFILLED_SYRINGE | INTRAVENOUS | Status: DC | PRN
  Administered 2020-07-18: 100 mg via INTRAVENOUS
  Administered 2020-07-18: 50 mg via INTRAVENOUS

## 2020-07-18 MED ORDER — FENTANYL CITRATE (PF) 100 MCG/2ML IJ SOLN
INTRAMUSCULAR | Status: AC
  Filled 2020-07-18: qty 2

## 2020-07-18 MED ORDER — LIDOCAINE HCL (PF) 1 % IJ SOLN
INTRAMUSCULAR | Status: DC | PRN
  Administered 2020-07-18: 15 mL

## 2020-07-18 MED ORDER — MORPHINE SULFATE (PF) 2 MG/ML IV SOLN
INTRAVENOUS | Status: AC
  Administered 2020-07-18: 02:00:00 2 mg via INTRAVENOUS
  Filled 2020-07-18: qty 1

## 2020-07-18 MED ORDER — NITROGLYCERIN IN D5W 200-5 MCG/ML-% IV SOLN: 0.0000 ug/min | INTRAVENOUS | Status: DC

## 2020-07-18 MED ORDER — VANCOMYCIN HCL IN DEXTROSE 1-5 GM/200ML-% IV SOLN
1000.0000 mg | Freq: Once | INTRAVENOUS | Status: AC
  Administered 2020-07-19: 07:00:00 1000 mg via INTRAVENOUS
  Filled 2020-07-18: qty 200

## 2020-07-18 MED ORDER — POTASSIUM CHLORIDE 10 MEQ/50ML IV SOLN
10.0000 meq | INTRAVENOUS | Status: AC
  Filled 2020-07-18: qty 50

## 2020-07-18 MED ORDER — LACTATED RINGERS IV SOLN: 500.0000 mL | Freq: Once | INTRAVENOUS | Status: DC | PRN

## 2020-07-18 MED ORDER — SODIUM CHLORIDE 0.9 % IV SOLN
INTRAVENOUS | Status: DC
  Filled 2020-07-18: qty 30

## 2020-07-18 MED ORDER — TRANEXAMIC ACID (OHS) PUMP PRIME SOLUTION
2.0000 mg/kg | INTRAVENOUS | Status: DC
  Filled 2020-07-18: qty 1.4

## 2020-07-18 MED ORDER — EPHEDRINE SULFATE-NACL 50-0.9 MG/10ML-% IV SOSY
PREFILLED_SYRINGE | INTRAVENOUS | Status: DC | PRN
  Administered 2020-07-18 (×3): 10 mg via INTRAVENOUS
  Administered 2020-07-18: 5 mg via INTRAVENOUS

## 2020-07-18 MED ORDER — HEPARIN SODIUM (PORCINE) 1000 UNIT/ML IJ SOLN
INTRAMUSCULAR | Status: AC
  Filled 2020-07-18: qty 1

## 2020-07-18 MED ORDER — ALBUMIN HUMAN 5 % IV SOLN
250.0000 mL | INTRAVENOUS | Status: AC | PRN
  Administered 2020-07-18 – 2020-07-19 (×2): 12.5 g via INTRAVENOUS

## 2020-07-18 MED ORDER — ROCURONIUM BROMIDE 10 MG/ML (PF) SYRINGE
PREFILLED_SYRINGE | INTRAVENOUS | Status: AC
  Filled 2020-07-18: qty 20

## 2020-07-18 MED ORDER — HEMOSTATIC AGENTS (NO CHARGE) OPTIME
TOPICAL | Status: DC | PRN
  Administered 2020-07-18: 1 via TOPICAL

## 2020-07-18 MED ORDER — NITROGLYCERIN IN D5W 200-5 MCG/ML-% IV SOLN
2.0000 ug/min | INTRAVENOUS | Status: AC
  Administered 2020-07-18: 15:00:00 30 ug/min via INTRAVENOUS

## 2020-07-18 MED ORDER — NITROGLYCERIN IN D5W 200-5 MCG/ML-% IV SOLN
0.0000 ug/min | INTRAVENOUS | Status: DC
  Administered 2020-07-18: 02:00:00 5 ug/min via INTRAVENOUS

## 2020-07-18 MED ORDER — FENTANYL CITRATE (PF) 250 MCG/5ML IJ SOLN
INTRAMUSCULAR | Status: DC | PRN
  Administered 2020-07-18: 250 ug via INTRAVENOUS
  Administered 2020-07-18: 100 ug via INTRAVENOUS
  Administered 2020-07-18: 150 ug via INTRAVENOUS
  Administered 2020-07-18: 300 ug via INTRAVENOUS
  Administered 2020-07-18: 200 ug via INTRAVENOUS

## 2020-07-18 MED ORDER — LIDOCAINE HCL (PF) 1 % IJ SOLN
INTRAMUSCULAR | Status: AC
  Filled 2020-07-18: qty 30

## 2020-07-18 MED ORDER — LIDOCAINE 2% (20 MG/ML) 5 ML SYRINGE
INTRAMUSCULAR | Status: DC | PRN
  Administered 2020-07-18: 100 mg via INTRAVENOUS

## 2020-07-18 MED ORDER — PHENYLEPHRINE 40 MCG/ML (10ML) SYRINGE FOR IV PUSH (FOR BLOOD PRESSURE SUPPORT)
PREFILLED_SYRINGE | INTRAVENOUS | Status: AC
  Filled 2020-07-18: qty 20

## 2020-07-18 MED ORDER — HEPARIN SODIUM (PORCINE) 1000 UNIT/ML IJ SOLN
INTRAMUSCULAR | Status: DC | PRN
  Administered 2020-07-18: 19000 [IU] via INTRAVENOUS

## 2020-07-18 MED ORDER — ONDANSETRON HCL 4 MG/2ML IJ SOLN
INTRAMUSCULAR | Status: DC | PRN
  Administered 2020-07-18: 4 mg via INTRAVENOUS

## 2020-07-18 MED ORDER — MILRINONE LACTATE IN DEXTROSE 20-5 MG/100ML-% IV SOLN
INTRAVENOUS | Status: DC | PRN
  Administered 2020-07-18: .125 ug/kg/min via INTRAVENOUS

## 2020-07-18 MED ORDER — PROTAMINE SULFATE 10 MG/ML IV SOLN
INTRAVENOUS | Status: DC | PRN
  Administered 2020-07-18: 160 mg via INTRAVENOUS
  Administered 2020-07-18: 30 mg via INTRAVENOUS

## 2020-07-18 MED ORDER — PROTAMINE SULFATE 10 MG/ML IV SOLN
INTRAVENOUS | Status: AC
  Filled 2020-07-18: qty 25

## 2020-07-18 MED ORDER — ARTIFICIAL TEARS OPHTHALMIC OINT
TOPICAL_OINTMENT | OPHTHALMIC | Status: AC
  Filled 2020-07-18: qty 3.5

## 2020-07-18 MED ORDER — ETOMIDATE 2 MG/ML IV SOLN
INTRAVENOUS | Status: DC | PRN
  Administered 2020-07-18: 16 mg via INTRAVENOUS

## 2020-07-18 MED ORDER — TRANEXAMIC ACID (OHS) BOLUS VIA INFUSION
15.0000 mg/kg | INTRAVENOUS | Status: AC
  Administered 2020-07-18: 16:00:00 1053 mg via INTRAVENOUS
  Filled 2020-07-18: qty 1053

## 2020-07-18 MED ORDER — SODIUM BICARBONATE 8.4 % IV SOLN
50.0000 meq | Freq: Once | INTRAVENOUS | Status: AC
  Administered 2020-07-18: 21:00:00 50 meq via INTRAVENOUS

## 2020-07-18 MED ORDER — MIDAZOLAM HCL 2 MG/2ML IJ SOLN
INTRAMUSCULAR | Status: DC | PRN
  Administered 2020-07-18: 2 mg via INTRAVENOUS

## 2020-07-18 MED ORDER — PROPOFOL 10 MG/ML IV BOLUS
INTRAVENOUS | Status: AC
  Filled 2020-07-18: qty 20

## 2020-07-18 MED ORDER — BISACODYL 10 MG RE SUPP: 10.0000 mg | Freq: Every day | RECTAL | Status: DC

## 2020-07-18 MED ORDER — ASPIRIN EC 325 MG PO TBEC
325.0000 mg | DELAYED_RELEASE_TABLET | Freq: Every day | ORAL | Status: DC
  Administered 2020-07-19 – 2020-07-22 (×3): 325 mg via ORAL
  Filled 2020-07-18 (×3): qty 1

## 2020-07-18 MED ORDER — SUCCINYLCHOLINE CHLORIDE 200 MG/10ML IV SOSY
PREFILLED_SYRINGE | INTRAVENOUS | Status: DC | PRN
  Administered 2020-07-18: 140 mg via INTRAVENOUS

## 2020-07-18 MED ORDER — ASPIRIN 81 MG PO CHEW
324.0000 mg | CHEWABLE_TABLET | Freq: Every day | ORAL | Status: DC
  Administered 2020-07-21: 11:00:00 324 mg
  Filled 2020-07-18: qty 4

## 2020-07-18 MED ORDER — EPHEDRINE 5 MG/ML INJ
INTRAVENOUS | Status: AC
  Filled 2020-07-18: qty 10

## 2020-07-18 MED ORDER — ACETAMINOPHEN 160 MG/5ML PO SOLN
650.0000 mg | Freq: Once | ORAL | Status: AC
  Administered 2020-07-18: 650 mg
  Filled 2020-07-18: qty 20.3

## 2020-07-18 MED ORDER — MAGNESIUM SULFATE 50 % IJ SOLN
40.0000 meq | INTRAMUSCULAR | Status: DC
  Filled 2020-07-18: qty 9.85

## 2020-07-18 MED ORDER — ONDANSETRON HCL 4 MG/2ML IJ SOLN
INTRAMUSCULAR | Status: AC
  Filled 2020-07-18: qty 2

## 2020-07-18 MED ORDER — DEXMEDETOMIDINE HCL IN NACL 400 MCG/100ML IV SOLN
0.1000 ug/kg/h | INTRAVENOUS | Status: AC
  Administered 2020-07-18: 16:00:00 .7 ug/kg/h via INTRAVENOUS
  Filled 2020-07-18: qty 100

## 2020-07-18 MED ORDER — DOPAMINE-DEXTROSE 3.2-5 MG/ML-% IV SOLN: 0.0000 ug/kg/min | INTRAVENOUS | Status: DC

## 2020-07-18 SURGICAL SUPPLY — 116 items
ADAPTER CARDIO PERF ANTE/RETRO (ADAPTER) ×4 IMPLANT
BAG DECANTER FOR FLEXI CONT (MISCELLANEOUS) ×4 IMPLANT
BLADE CLIPPER SURG (BLADE) ×4 IMPLANT
BLADE STERNUM SYSTEM 6 (BLADE) ×4 IMPLANT
BNDG ELASTIC 4X5.8 VLCR STR LF (GAUZE/BANDAGES/DRESSINGS) ×4 IMPLANT
BNDG ELASTIC 6X5.8 VLCR STR LF (GAUZE/BANDAGES/DRESSINGS) ×4 IMPLANT
BNDG GAUZE ELAST 4 BULKY (GAUZE/BANDAGES/DRESSINGS) ×4 IMPLANT
CANISTER SUCT 3000ML PPV (MISCELLANEOUS) ×4 IMPLANT
CANNULA GUNDRY RCSP 15FR (MISCELLANEOUS) ×4 IMPLANT
CATH HEART VENT LEFT (CATHETERS) ×2 IMPLANT
CATH ROBINSON RED A/P 18FR (CATHETERS) ×16 IMPLANT
CATH THORACIC 28FR (CATHETERS) ×4 IMPLANT
CATH THORACIC 36FR (CATHETERS) ×4 IMPLANT
CATH THORACIC 36FR RT ANG (CATHETERS) ×4 IMPLANT
CLIP VESOCCLUDE MED 24/CT (CLIP) IMPLANT
CLIP VESOCCLUDE SM WIDE 24/CT (CLIP) IMPLANT
CONN 3/8X3/8 GISH STERILE (MISCELLANEOUS) ×4 IMPLANT
DERMABOND ADVANCED (GAUZE/BANDAGES/DRESSINGS) ×2
DERMABOND ADVANCED .7 DNX12 (GAUZE/BANDAGES/DRESSINGS) ×2 IMPLANT
DRAPE CARDIOVASCULAR INCISE (DRAPES) ×2
DRAPE SLUSH/WARMER DISC (DRAPES) ×4 IMPLANT
DRAPE SRG 135X102X78XABS (DRAPES) ×2 IMPLANT
DRSG COVADERM 4X14 (GAUZE/BANDAGES/DRESSINGS) ×4 IMPLANT
DRSG TEGADERM 4X4.75 (GAUZE/BANDAGES/DRESSINGS) ×12 IMPLANT
ELECT CAUTERY BLADE 6.4 (BLADE) ×4 IMPLANT
ELECT REM PT RETURN 9FT ADLT (ELECTROSURGICAL) ×8
ELECTRODE REM PT RTRN 9FT ADLT (ELECTROSURGICAL) ×4 IMPLANT
FELT TEFLON 1X6 (MISCELLANEOUS) ×4 IMPLANT
GAUZE SPONGE 4X4 12PLY STRL (GAUZE/BANDAGES/DRESSINGS) ×8 IMPLANT
GAUZE SPONGE 4X4 12PLY STRL LF (GAUZE/BANDAGES/DRESSINGS) ×12 IMPLANT
GLOVE BIO SURGEON STRL SZ 6 (GLOVE) IMPLANT
GLOVE BIO SURGEON STRL SZ 6.5 (GLOVE) ×6 IMPLANT
GLOVE BIO SURGEON STRL SZ7 (GLOVE) IMPLANT
GLOVE BIO SURGEON STRL SZ7.5 (GLOVE) ×4 IMPLANT
GLOVE BIO SURGEONS STRL SZ 6.5 (GLOVE) ×2
GLOVE BIOGEL PI IND STRL 6 (GLOVE) IMPLANT
GLOVE BIOGEL PI IND STRL 6.5 (GLOVE) IMPLANT
GLOVE BIOGEL PI IND STRL 7.0 (GLOVE) IMPLANT
GLOVE BIOGEL PI IND STRL 7.5 (GLOVE) ×2 IMPLANT
GLOVE BIOGEL PI INDICATOR 6 (GLOVE)
GLOVE BIOGEL PI INDICATOR 6.5 (GLOVE)
GLOVE BIOGEL PI INDICATOR 7.0 (GLOVE)
GLOVE BIOGEL PI INDICATOR 7.5 (GLOVE) ×2
GLOVE ORTHO TXT STRL SZ7.5 (GLOVE) IMPLANT
GLOVE SURG UNDER POLY LF SZ6 (GLOVE) ×4 IMPLANT
GLOVE SURG UNDER POLY LF SZ6.5 (GLOVE) ×8 IMPLANT
GLOVE TRIUMPH SURG SIZE 7.0 (KITS) ×8 IMPLANT
GOWN STRL REUS W/ TWL LRG LVL3 (GOWN DISPOSABLE) ×8 IMPLANT
GOWN STRL REUS W/ TWL XL LVL3 (GOWN DISPOSABLE) ×2 IMPLANT
GOWN STRL REUS W/TWL LRG LVL3 (GOWN DISPOSABLE) ×8
GOWN STRL REUS W/TWL XL LVL3 (GOWN DISPOSABLE) ×2
HEMOSTAT POWDER SURGIFOAM 1G (HEMOSTASIS) ×12 IMPLANT
HEMOSTAT SURGICEL 2X14 (HEMOSTASIS) ×4 IMPLANT
INSERT FOGARTY 61MM (MISCELLANEOUS) IMPLANT
INSERT FOGARTY XLG (MISCELLANEOUS) IMPLANT
KIT BASIN OR (CUSTOM PROCEDURE TRAY) ×4 IMPLANT
KIT CATH CPB BARTLE (MISCELLANEOUS) ×4 IMPLANT
KIT SUCTION CATH 14FR (SUCTIONS) ×4 IMPLANT
KIT TURNOVER KIT B (KITS) ×4 IMPLANT
KIT VASOVIEW HEMOPRO 2 VH 4000 (KITS) ×4 IMPLANT
LINE VENT (MISCELLANEOUS) ×4 IMPLANT
NDL SUT 1 .5 CRC FRENCH EYE (NEEDLE) ×2 IMPLANT
NEEDLE FRENCH EYE (NEEDLE) ×2
NS IRRIG 1000ML POUR BTL (IV SOLUTION) ×20 IMPLANT
PACK E OPEN HEART (SUTURE) ×4 IMPLANT
PACK OPEN HEART (CUSTOM PROCEDURE TRAY) ×4 IMPLANT
PAD ARMBOARD 7.5X6 YLW CONV (MISCELLANEOUS) ×8 IMPLANT
PAD ELECT DEFIB RADIOL ZOLL (MISCELLANEOUS) ×4 IMPLANT
PENCIL BUTTON HOLSTER BLD 10FT (ELECTRODE) ×4 IMPLANT
POSITIONER HEAD DONUT 9IN (MISCELLANEOUS) ×4 IMPLANT
PUNCH AORTIC ROTATE 4.0MM (MISCELLANEOUS) IMPLANT
PUNCH AORTIC ROTATE 4.5MM 8IN (MISCELLANEOUS) ×4 IMPLANT
PUNCH AORTIC ROTATE 5MM 8IN (MISCELLANEOUS) IMPLANT
SET CARDIOPLEGIA MPS 5001102 (MISCELLANEOUS) ×4 IMPLANT
SPONGE INTESTINAL PEANUT (DISPOSABLE) IMPLANT
SPONGE LAP 18X18 RF (DISPOSABLE) ×4 IMPLANT
SPONGE LAP 4X18 RFD (DISPOSABLE) ×8 IMPLANT
SUPPORT HEART JANKE-BARRON (MISCELLANEOUS) ×4 IMPLANT
SUT BONE WAX W31G (SUTURE) ×4 IMPLANT
SUT ETHIBOND 2 0 SH (SUTURE) ×2
SUT ETHIBOND 2 0 SH 36X2 (SUTURE) ×2 IMPLANT
SUT MNCRL AB 4-0 PS2 18 (SUTURE) IMPLANT
SUT PROLENE 3 0 SH DA (SUTURE) IMPLANT
SUT PROLENE 3 0 SH1 36 (SUTURE) ×8 IMPLANT
SUT PROLENE 4 0 RB 1 (SUTURE) ×2
SUT PROLENE 4 0 SH DA (SUTURE) IMPLANT
SUT PROLENE 4-0 RB1 .5 CRCL 36 (SUTURE) ×2 IMPLANT
SUT PROLENE 5 0 C 1 36 (SUTURE) IMPLANT
SUT PROLENE 6 0 C 1 30 (SUTURE) ×4 IMPLANT
SUT PROLENE 7 0 BV 1 (SUTURE) IMPLANT
SUT PROLENE 7 0 BV1 MDA (SUTURE) ×8 IMPLANT
SUT SILK  1 MH (SUTURE)
SUT SILK 1 MH (SUTURE) IMPLANT
SUT SILK 2 0 SH (SUTURE) IMPLANT
SUT STEEL STERNAL CCS#1 18IN (SUTURE) IMPLANT
SUT STEEL SZ 6 DBL 3X14 BALL (SUTURE) ×12 IMPLANT
SUT VIC AB 1 CTX 36 (SUTURE) ×4
SUT VIC AB 1 CTX36XBRD ANBCTR (SUTURE) ×4 IMPLANT
SUT VIC AB 2-0 CT1 27 (SUTURE) ×2
SUT VIC AB 2-0 CT1 TAPERPNT 27 (SUTURE) ×2 IMPLANT
SUT VIC AB 2-0 CTX 27 (SUTURE) IMPLANT
SUT VIC AB 3-0 SH 27 (SUTURE)
SUT VIC AB 3-0 SH 27X BRD (SUTURE) IMPLANT
SUT VIC AB 3-0 X1 27 (SUTURE) IMPLANT
SUT VICRYL 4-0 PS2 18IN ABS (SUTURE) IMPLANT
SYSTEM SAHARA CHEST DRAIN ATS (WOUND CARE) ×4 IMPLANT
TAPE CLOTH SURG 4X10 WHT LF (GAUZE/BANDAGES/DRESSINGS) ×4 IMPLANT
TAPE PAPER 2X10 WHT MICROPORE (GAUZE/BANDAGES/DRESSINGS) ×4 IMPLANT
TOWEL GREEN STERILE (TOWEL DISPOSABLE) ×4 IMPLANT
TOWEL GREEN STERILE FF (TOWEL DISPOSABLE) ×4 IMPLANT
TRAY FOLEY SLVR 14FR TEMP STAT (SET/KITS/TRAYS/PACK) ×4 IMPLANT
TRAY FOLEY SLVR 16FR TEMP STAT (SET/KITS/TRAYS/PACK) IMPLANT
TUBING LAP HI FLOW INSUFFLATIO (TUBING) ×4 IMPLANT
UNDERPAD 30X36 HEAVY ABSORB (UNDERPADS AND DIAPERS) ×4 IMPLANT
VENT LEFT HEART 12002 (CATHETERS) ×4
WATER STERILE IRR 1000ML POUR (IV SOLUTION) ×8 IMPLANT

## 2020-07-18 SURGICAL SUPPLY — 16 items
BALLN SAPPHIRE 2.0X12 (BALLOONS) ×2
BALLOON SAPPHIRE 2.0X12 (BALLOONS) ×1 IMPLANT
CATH LAUNCHER 6FR EBU3.5 (CATHETERS) ×2 IMPLANT
ELECT DEFIB PAD ADLT CADENCE (PAD) ×2 IMPLANT
KIT ENCORE 26 ADVANTAGE (KITS) ×2 IMPLANT
KIT ESSENTIALS PG (KITS) ×2 IMPLANT
KIT HEART LEFT (KITS) ×2 IMPLANT
PACK CARDIAC CATHETERIZATION (CUSTOM PROCEDURE TRAY) ×2 IMPLANT
SHEATH PINNACLE 6F 10CM (SHEATH) ×2 IMPLANT
SHEATH PROBE COVER 6X72 (BAG) ×2 IMPLANT
TRANSDUCER W/STOPCOCK (MISCELLANEOUS) ×2 IMPLANT
TUBING CIL FLEX 10 FLL-RA (TUBING) ×2 IMPLANT
WIRE ASAHI PROWATER 180CM (WIRE) ×6 IMPLANT
WIRE EMERALD 3MM-J .035X150CM (WIRE) ×2 IMPLANT
WIRE HI TORQ WHISPER MS 190CM (WIRE) ×2 IMPLANT
WIRE PT2 MS 185 (WIRE) ×2 IMPLANT

## 2020-07-18 SURGICAL SUPPLY — 10 items
CATH 5FR JL3.5 JR4 ANG PIG MP (CATHETERS) ×1 IMPLANT
DEVICE RAD COMP TR BAND LRG (VASCULAR PRODUCTS) ×1 IMPLANT
ELECT DEFIB PAD ADLT CADENCE (PAD) ×1 IMPLANT
GLIDESHEATH SLEND SS 6F .021 (SHEATH) ×1 IMPLANT
GUIDEWIRE INQWIRE 1.5J.035X260 (WIRE) IMPLANT
INQWIRE 1.5J .035X260CM (WIRE) ×2
KIT HEART LEFT (KITS) ×2 IMPLANT
PACK CARDIAC CATHETERIZATION (CUSTOM PROCEDURE TRAY) ×2 IMPLANT
TRANSDUCER W/STOPCOCK (MISCELLANEOUS) ×2 IMPLANT
TUBING CIL FLEX 10 FLL-RA (TUBING) ×2 IMPLANT

## 2020-07-18 NOTE — Progress Notes (Signed)
Progress Note  Patient Name: Brenda Wilkins Date of Encounter: 07/18/2020  [facility] Cardiologist: No primary care provider on file. New  Subjective   Patient had recurrent chest pain last night radiating to back and into shoulder. Associated with SOB, sweating, and dry heaves. Pain is much better now but still slight tightness.   Inpatient Medications    Scheduled Meds: . aspirin EC  81 mg Oral Daily  . atorvastatin  80 mg Oral Daily  . metoprolol tartrate  25 mg Oral BID  . sodium chloride flush  3 mL Intravenous Q12H   Continuous Infusions: . sodium chloride    . sodium chloride 1 mL/kg/hr (07/18/20 0535)  . heparin 900 Units/hr (07/17/20 1656)  . nitroGLYCERIN 5 mcg/min (07/18/20 0207)   PRN Meds: sodium chloride, acetaminophen, nitroGLYCERIN, ondansetron (ZOFRAN) IV, sodium chloride flush   Vital Signs    Vitals:   07/18/20 0131 07/18/20 0147 07/18/20 0152 07/18/20 0400  BP: 134/75 (!) 144/80 139/70 116/63  Pulse: (!) 59 (!) 55 (!) 53 (!) 59  Resp:    15  Temp:    98.6 F (37 C)  TempSrc:    Oral  SpO2: 100% 100% 100% 96%  Weight:    70.2 kg  Height:        Intake/Output Summary (Last 24 hours) at 07/18/2020 0738 Last data filed at 07/18/2020 0700 Gross per 24 hour  Intake 123.45 ml  Output 1450 ml  Net -1326.55 ml   Last 3 Weights 07/18/2020 07/17/2020 07/16/2020  Weight (lbs) 154 lb 12.8 oz 153 lb 4.8 oz 152 lb 1.9 oz  Weight (kg) 70.217 kg 69.536 kg 69 kg      Telemetry    NSR - Personally Reviewed  ECG    Sinus brady. No acute change - Personally Reviewed  Physical Exam   GEN: No acute distress.   Neck: No JVD Cardiac: RRR, no murmurs, rubs, or gallops. No edema. Pulses 2+ throughout. Respiratory: Clear to auscultation bilaterally. GI: Soft, nontender, non-distended  MS: No edema; No deformity. Neuro:  Nonfocal  Psych: Normal affect   Labs    High Sensitivity Troponin:   Recent Labs  Lab 07/16/20 1807 07/16/20 1934  07/17/20 1015 07/17/20 1326 07/18/20 0235  TROPONINIHS 136* 227* 321* 290* 174*      Chemistry Recent Labs  Lab 07/16/20 1807 07/17/20 1015  NA 140 139  K 3.9 4.0  CL 108 107  CO2 21* 21*  GLUCOSE 98 119*  BUN 12 15  CREATININE 0.94 0.96  CALCIUM 8.9 8.7*  PROT 7.2  --   ALBUMIN 3.7  --   AST 30  --   ALT 30  --   ALKPHOS 117  --   BILITOT 0.7  --   GFRNONAA >60 >60  ANIONGAP 11 11     Hematology Recent Labs  Lab 07/16/20 1807 07/17/20 1015 07/18/20 0235  WBC 9.5 10.1 8.5  RBC 4.80 4.69 4.49  HGB 14.5 13.6 13.4  HCT 42.9 42.3 40.6  MCV 89.4 90.2 90.4  MCH 30.2 29.0 29.8  MCHC 33.8 32.2 33.0  RDW 12.9 13.2 13.2  PLT 252 248 251    BNPNo results for input(s): BNP, PROBNP in the last 168 hours.   DDimer No results for input(s): DDIMER in the last 168 hours.   Radiology    DG Chest Port 1 View  Result Date: 07/16/2020 CLINICAL DATA:  Chest pain. EXAM: PORTABLE CHEST 1 VIEW COMPARISON:  None. FINDINGS: The cardiomediastinal  contours are normal. There is mild interstitial coarsening of unknown chronicity. Pulmonary vasculature is normal. No consolidation, pleural effusion, or pneumothorax. No acute osseous abnormalities are seen. IMPRESSION: Mild interstitial coarsening of unknown chronicity. This may be chronic or represent atypical infection. Electronically Signed   By: Narda Rutherford M.D.   On: 07/16/2020 17:58   ECHOCARDIOGRAM COMPLETE  Result Date: 07/17/2020    ECHOCARDIOGRAM REPORT   Patient Name:   Brenda Wilkins Date of Exam: 07/17/2020 Medical Rec #:  [id number]     Height:       60.0 in Accession #:    [id number]    Weight:       153.3 lb Date of Birth:  [birth year]      BSA:          1.667 m Patient Age:    74 years      BP:           112/66 mmHg Patient Gender: F             HR:           71 bpm. Exam Location:  Inpatient Procedure: 2D Echo, Cardiac Doppler and Color Doppler Indications:    Acute Coronary Syndrome I24.9  History:        Patient has no  prior history of Echocardiogram examinations.                 Risk Factors:Dyslipidemia.  Sonographer:    Elmarie Shiley Dance Referring Phys: [id number] DENNIS NARCISSE JR IMPRESSIONS  1. Left ventricular ejection fraction, by estimation, is 55 to 60%. The left ventricle has normal function. The left ventricle has no regional wall motion abnormalities. There is moderate asymmetric left ventricular hypertrophy. Left ventricular diastolic parameters are consistent with Grade I diastolic dysfunction (impaired relaxation).  2. Right ventricular systolic function is normal. The right ventricular size is normal.  3. The mitral valve is grossly normal. No evidence of mitral valve regurgitation.  4. The aortic valve was not well visualized. Aortic valve regurgitation is not visualized. No aortic stenosis is present. Comparison(s): No prior Echocardiogram. Conclusion(s)/Recommendation(s): Normal biventricular function without evidence of hemodynamically significant valvular heart disease. FINDINGS  Left Ventricle: Left ventricular ejection fraction, by estimation, is 55 to 60%. The left ventricle has normal function. The left ventricle has no regional wall motion abnormalities. The left ventricular internal cavity size was small. There is moderate  asymmetric left ventricular hypertrophy. Left ventricular diastolic parameters are consistent with Grade I diastolic dysfunction (impaired relaxation). Right Ventricle: The right ventricular size is normal. No increase in right ventricular wall thickness. Right ventricular systolic function is normal. Left Atrium: Left atrial size was normal in size. Right Atrium: Right atrial size was normal in size. Pericardium: There is no evidence of pericardial effusion. Mitral Valve: The mitral valve is grossly normal. No evidence of mitral valve regurgitation. Tricuspid Valve: The tricuspid valve is grossly normal. Tricuspid valve regurgitation is not demonstrated. Aortic Valve: The aortic valve was  not well visualized. Aortic valve regurgitation is not visualized. No aortic stenosis is present. Pulmonic Valve: The pulmonic valve was grossly normal. Pulmonic valve regurgitation is not visualized. Aorta: The aortic root and ascending aorta are structurally normal, with no evidence of dilitation. IAS/Shunts: The atrial septum is grossly normal.  LEFT VENTRICLE PLAX 2D LVIDd:         3.60 cm  Diastology LVIDs:         2.50 cm  LV e' medial:    5.00  cm/s LV PW:         1.50 cm  LV E/e' medial:  12.0 LV IVS:        1.00 cm  LV e' lateral:   6.42 cm/s LVOT diam:     2.00 cm  LV E/e' lateral: 9.3 LV SV:         61 LV SV Index:   37 LVOT Area:     3.14 cm  RIGHT VENTRICLE             IVC RV Basal diam:  2.30 cm     IVC diam: 1.50 cm RV S prime:     14.10 cm/s TAPSE (M-mode): 1.7 cm LEFT ATRIUM             Index       RIGHT ATRIUM           Index LA diam:        2.80 cm 1.68 cm/m  RA Area:     10.80 cm LA Vol (A2C):   33.5 ml 20.09 ml/m RA Volume:   22.50 ml  13.50 ml/m LA Vol (A4C):   23.1 ml 13.86 ml/m LA Biplane Vol: 28.1 ml 16.86 ml/m  AORTIC VALVE LVOT Vmax:   90.50 cm/s LVOT Vmean:  60.700 cm/s LVOT VTI:    0.194 m  AORTA Ao Root diam: 2.70 cm Ao Asc diam:  3.20 cm MITRAL VALVE MV Area (PHT): 2.34 cm     SHUNTS MV Decel Time: 324 msec     Systemic VTI:  0.19 m MV E velocity: 60.00 cm/s   Systemic Diam: 2.00 cm MV A velocity: 105.00 cm/s MV E/A ratio:  0.57 Riley Lam MD Electronically signed by Riley Lam MD Signature Date/Time: 07/17/2020/12:09:10 PM    Final     Cardiac Studies   Echo see above  Patient Profile     74 y.o. female with HLD and family history of CAD presents with typical angina and NSTEMI  Assessment & Plan    1. NSTEMI. Peak troponin 321 with fairly flat trend. Ecg without acute ST-T changes. Echo showed normal LV function. Patient with recurrent pain last pm despite good medical therapy. Continue ASA, statin, metoprolol and IV Ntg. Plan cardiac cath this  am. The procedure and risks were reviewed including but not limited to death, myocardial infarction, stroke, arrythmias, bleeding, transfusion, emergency surgery, dye allergy, or renal dysfunction. The patient voices understanding and is agreeable to proceed.. 2. Hypercholesterolemia. Total cholesterol 254 with LDL 183. ? Familial hypercholesterolemia. On high dose statin now.  3. Family history of CAD  For questions or updates, please contact [facility] Please consult [email or web address] for contact info under        Signed, Kimberlea Schlag [place], MD  07/18/2020, 7:38 AM

## 2020-07-18 NOTE — Anesthesia Postprocedure Evaluation (Signed)
Anesthesia Post Note  Patient: Brenda Wilkins  Procedure(s) Performed: CORONARY ARTERY BYPASS GRAFTING (CABG), ON PUMP, TIMES THREE, USING ENDOSCOPICALLY HARVESTED RIGHT GREATER SAPHENOUS VEIN (N/A Chest) TRANSESOPHAGEAL ECHOCARDIOGRAM (TEE) (N/A )     Patient location during evaluation: SICU Anesthesia Type: General Level of consciousness: sedated Pain management: pain level controlled Vital Signs Assessment: post-procedure vital signs reviewed and stable Respiratory status: patient remains intubated per anesthesia plan Cardiovascular status: stable Postop Assessment: no apparent nausea or vomiting Anesthetic complications: no   No complications documented.  Last Vitals:  Vitals:   07/18/20 1455 07/18/20 1500  BP: (!) 150/84 (!) 151/86  Pulse: (!) 51 (!) 104  Resp: (!) 22 (!) 8  Temp:    SpO2: 95% 98%    Last Pain:  Vitals:   07/18/20 1501  TempSrc:   PainSc: 6                  Shelton Silvas

## 2020-07-18 NOTE — Anesthesia Preprocedure Evaluation (Addendum)
Anesthesia Evaluation  Patient identified by MRN, date of birth, ID band Patient awake    Reviewed: Allergy & Precautions, NPO status , Patient's Chart, lab work & pertinent test resultsPreop documentation limited or incomplete due to emergent nature of procedure.  History of Anesthesia Complications Negative for: history of anesthetic complications  Airway Mallampati: III  TM Distance: >3 FB Neck ROM: Full    Dental   Pulmonary neg pulmonary ROS,    breath sounds clear to auscultation       Cardiovascular + angina + Past MI   Rhythm:Regular Rate:Tachycardia  IMPRESSIONS    1. Left ventricular ejection fraction, by estimation, is 55 to 60%. The  left ventricle has normal function. The left ventricle has no regional  wall motion abnormalities. There is moderate asymmetric left ventricular  hypertrophy. Left ventricular  diastolic parameters are consistent with Grade I diastolic dysfunction  (impaired relaxation).  2. Right ventricular systolic function is normal. The right ventricular  size is normal.  3. The mitral valve is grossly normal. No evidence of mitral valve  regurgitation.  4. The aortic valve was not well visualized. Aortic valve regurgitation  is not visualized. No aortic stenosis is present.    Neuro/Psych negative neurological ROS     GI/Hepatic negative GI ROS, Neg liver ROS,   Endo/Other  negative endocrine ROS  Renal/GU negative Renal ROS     Musculoskeletal negative musculoskeletal ROS (+)   Abdominal   Peds  Hematology negative hematology ROS (+)   Anesthesia Other Findings   Reproductive/Obstetrics                            Anesthesia Physical Anesthesia Plan  ASA: IV and emergent  Anesthesia Plan: General   Post-op Pain Management:    Induction: Intravenous  PONV Risk Score and Plan: Ondansetron and Dexamethasone  Airway Management Planned: Oral  ETT  Additional Equipment: Arterial line, PA Cath, 3D TEE and Ultrasound Guidance Line Placement  Intra-op Plan:   Post-operative Plan: Post-operative intubation/ventilation  Informed Consent: I have reviewed the patients History and Physical, chart, labs and discussed the procedure including the risks, benefits and alternatives for the proposed anesthesia with the patient or authorized representative who has indicated his/her understanding and acceptance.       Plan Discussed with: Anesthesiologist, CRNA and Surgeon  Anesthesia Plan Comments:        Anesthesia Quick Evaluation

## 2020-07-18 NOTE — Brief Op Note (Addendum)
07/16/2020 - 07/18/2020  9:08 AM  PATIENT:  Brenda Wilkins  74 y.o. female  PRE-OPERATIVE DIAGNOSIS:  CHEST PAIN  POST-OPERATIVE DIAGNOSIS:  CHEST PAIN  PROCEDURE:  Procedure(s): CORONARY ARTERY BYPASS GRAFTING (CABG), ON PUMP, TIMES THREE, USING ENDOSCOPICALLY HARVESTED RIGHT GREATER SAPHENOUS VEIN (N/A) TRANSESOPHAGEAL ECHOCARDIOGRAM (TEE) (N/A)  SVG-DIAG SVG- DIAG-OM SEQ EVH 55 MINUTES SURGEON:  Surgeon(s) and Role:    * Bartle, Payton Doughty, MD - Primary  PHYSICIAN ASSISTANT: Myasia Sinatra PA-C  ANESTHESIA:   general  EBL:  766 mL   BLOOD ADMINISTERED:none  DRAINS: LEFT PLEURAL AND PERICARDIAL CHEST DRAINS   LOCAL MEDICATIONS USED:  NONE  SPECIMEN:  No Specimen  DISPOSITION OF SPECIMEN:  N/A  COUNTS:  YES  TOURNIQUET:  * No tourniquets in log *  DICTATION: .Dragon Dictation  PLAN OF CARE: Admit to inpatient   PATIENT DISPOSITION:  ICU - intubated and hemodynamically stable.   Delay start of Pharmacological VTE agent (>24hrs) due to surgical blood loss or risk of bleeding: yes

## 2020-07-18 NOTE — Consult Note (Signed)
[address]       [place] [address]             [phone]        Brenda Wilkins [facility] Medical Record #[id number] Date of Birth: 29-Aug-1945  Referring: No ref. provider found Primary Care: Patient, No Pcp Per Primary Cardiologist:No primary care provider on file.  Chief Complaint:    Chief Complaint  Patient presents with  . Chest Pain    History of Present Illness:    The patient is a 74 year old female who presented to the emergency department by EMS 07/17/2019 due  to a 2 to 3-day history of worsening chest pain.  The pain was exertional in nature and there was some radiation to the back and left shoulder.  The patient has not seen a medical care provider in several years.  She does reportedly have a history of hypercholesterolemia basis.  And at one time was on a statin but stopped this due to lower extremity muscular weakness.  She reportedly does have a strong family history of coronary artery disease.  In the emergency room she did rule in for non-STEMI.  Peak troponin was 321.  Her calculated LDL was 183 and she is also noted elevated triglycerides of 153.  Cardiology consultation was obtained and she was admitted for further evaluation and treatment.  She was started on aspirin and statin as well as heparin and metoprolol.  Cardiac catheterization was done on today's date and the full report is listed below.  She has significant LAD/diagonal as well as circumflex disease.  An echocardiogram has also been obtained and this shows normal left ejection fraction with estimated values at 55 to 60%.  Right ventricular function is normal and there is no noted significant valvular disease.  We are asked to see the patient in cardiothoracic surgical consultation for consideration of coronary artery surgical revascularization.  Current Activity/ Functional Status: Patient is independent with mobility/ambulation, transfers, ADL's, IADL's.   Zubrod Score: At the  time of surgery this patient's most appropriate activity status/level should be described as:     0    Normal activity, no symptoms     1    Restricted in physical strenuous activity but ambulatory, able to do out light work     2    Ambulatory and capable of self care, unable to do work activities, up and about                 more than 50%  Of the time                                3    Only limited self care, in bed greater than 50% of waking hours     4    Completely disabled, no self care, confined to bed or chair     5    Moribund  Past Medical History:  Diagnosis Date  . Hyperlipidemia     History reviewed. No pertinent surgical history.  Social History   Tobacco Use  Smoking Status Never Smoker  Smokeless Tobacco Never Used    Social History   Substance and Sexual Activity  Alcohol Use Never   PSH: basal cell CA from face Breast (L) lumpectomy    No Known Allergies  Current Facility-Administered Medications  Medication Dose Route Frequency Provider Last Rate Last Admin  .  0.9 %  sodium chloride infusion  250 mL Intravenous PRN Tonny Bollman, MD      . 0.9% sodium chloride infusion  1 mL/kg/hr Intravenous Continuous Tonny Bollman, MD 70.2 mL/hr at 07/18/20 1031 1 mL/kg/hr at 07/18/20 1031  . acetaminophen (TYLENOL) tablet 650 mg  650 mg Oral Q4H PRN Joellen Jersey., MD   650 mg at 07/17/20 1007  . aspirin EC tablet 81 mg  81 mg Oral Daily Joellen Jersey., MD   81 mg at 07/17/20 1007  . atorvastatin (LIPITOR) tablet 80 mg  80 mg Oral Daily Joellen Jersey., MD   80 mg at 07/18/20 1008  . heparin ADULT infusion 100 units/mL (25000 units/268mL sodium chloride 0.45%)  900 Units/hr Intravenous Continuous Joellen Jersey., MD   Stopped at 07/18/20 [phone]  . hydrALAZINE (APRESOLINE) injection 10 mg  10 mg Intravenous Q20 Min PRN Tonny Bollman, MD      . labetalol (NORMODYNE) injection 10 mg  10 mg Intravenous Q10 min PRN Tonny Bollman, MD       . metoprolol tartrate (LOPRESSOR) tablet 25 mg  25 mg Oral BID Joellen Jersey., MD   25 mg at 07/18/20 1008  . nitroGLYCERIN (NITROSTAT) SL tablet 0.4 mg  0.4 mg Sublingual Q5 Min x 3 PRN Joellen Jersey., MD   0.4 mg at 07/18/20 0118  . nitroGLYCERIN 50 mg in dextrose 5 % 250 mL (0.2 mg/mL) infusion  0-100 mcg/min Intravenous Titrated Clerance Lav, MD 3 mL/hr at 07/18/20 0942 10 mcg/min at 07/18/20 0942  . ondansetron (ZOFRAN) injection 4 mg  4 mg Intravenous Q6H PRN Joellen Jersey., MD      . sodium chloride flush (NS) 0.9 % injection 3 mL  3 mL Intravenous Q12H Tonny Bollman, MD      . sodium chloride flush (NS) 0.9 % injection 3 mL  3 mL Intravenous PRN Tonny Bollman, MD        Medications Prior to Admission  Medication Sig Dispense Refill Last Dose  . aspirin 325 MG EC tablet Take 650 mg by mouth once as needed (for sudden onset of chest pain).   07/16/2020 at 1400  . acetaminophen (TYLENOL) 325 MG tablet Take 325-650 mg by mouth every 6 (six) hours as needed for mild pain (or headaches).   More than a month at Unknown time    No family history on file.   Review of Systems:   Review of Systems  Constitutional: Positive for diaphoresis. Negative for chills, fever, malaise/fatigue and weight loss.  HENT: Negative for congestion, ear discharge, ear pain, hearing loss, nosebleeds, sinus pain, sore throat and tinnitus.   Eyes: Negative for blurred vision, double vision, photophobia, pain and discharge.  Respiratory: Negative for cough, hemoptysis, sputum production, shortness of breath, wheezing and stridor.   Cardiovascular: Positive for chest pain. Negative for palpitations, orthopnea, claudication, leg swelling and PND.  Gastrointestinal: Positive for abdominal pain. Negative for blood in stool, constipation, diarrhea, heartburn, melena, nausea and vomiting.  Genitourinary: Negative.   Musculoskeletal: Negative for back pain, falls, joint pain, myalgias and neck  pain.       A bout with gout 3-4 months ago  Skin: Negative.   Neurological: Negative for dizziness, tingling, tremors, sensory change, speech change, focal weakness, seizures, loss of consciousness, weakness and headaches.  Endo/Heme/Allergies: Negative for environmental allergies and polydipsia. Does not bruise/bleed easily.  Psychiatric/Behavioral: Negative for depression, hallucinations, memory loss, substance abuse and suicidal ideas. The patient is not  nervous/anxious and does not have insomnia.         Physical Exam: BP 134/75 (BP Location: Left Arm)   Pulse 76   Temp 98.6 F (37 C) (Oral)   Resp 17   Ht 5' (1.524 m)   Wt 70.2 kg   SpO2 96%   BMI 30.23 kg/m    Physical Exam  Constitutional: She appears healthy. No distress.  HENT:  Nose: No nasal discharge.  Mouth/Throat: No dental caries. Oropharynx is clear.  Eyes: Pupils are equal, round, and reactive to light.  Neck: Thyroid normal. No JVD present. No neck adenopathy. No thyromegaly present.  Cardiovascular: Regular rhythm, S1 normal, S2 normal, normal heart sounds, intact distal pulses and normal pulses. Exam reveals no gallop.  No murmur heard. Pulmonary/Chest: Effort normal and breath sounds normal. She has no wheezes. She has no rales. She exhibits no tenderness.  Abdominal: Soft. Bowel sounds are normal. She exhibits no distension and no mass. There is no hepatomegaly. There is no abdominal tenderness.  Musculoskeletal:        General: No tenderness, deformity or edema. Normal range of motion.     Cervical back: Normal range of motion and neck supple.  Neurological: She is alert and oriented to person, place, and time. She has normal motor skills.  Skin: Skin is warm and dry. Rash noted. No jaundice, pallor or plethora.    Diagnostic Studies & Laboratory data:     Recent Radiology Findings:   CARDIAC CATHETERIZATION  Result Date: 07/18/2020 1.  Critical stenosis of the proximal LAD/first diagonal that a  major bifurcation point 2.  Severe eccentric proximal left circumflex stenosis 3.  Patent right coronary artery with no significant stenosis (dominant vessel) 4.  Normal LV function with LVEF 55 to 65% Recommend: Cardiac surgical consultation for consideration of CABG.  If patient felt to be a poor candidate for CABG, would attempt bifurcation PCI of the LAD diagonal and PCI the proximal circumflex.  With normal LV function, good angiographic targets, and no major comorbidities I suspect she will be a suitable candidate for coronary bypass surgery.  DG Chest Port 1 View  Result Date: 07/16/2020 CLINICAL DATA:  Chest pain. EXAM: PORTABLE CHEST 1 VIEW COMPARISON:  None. FINDINGS: The cardiomediastinal contours are normal. There is mild interstitial coarsening of unknown chronicity. Pulmonary vasculature is normal. No consolidation, pleural effusion, or pneumothorax. No acute osseous abnormalities are seen. IMPRESSION: Mild interstitial coarsening of unknown chronicity. This may be chronic or represent atypical infection. Electronically Signed   By: Narda Rutherford M.D.   On: 07/16/2020 17:58   ECHOCARDIOGRAM COMPLETE  Result Date: 07/17/2020    ECHOCARDIOGRAM REPORT   Patient Name:   [facility] Date of Exam: 07/17/2020 Medical Rec #:  [id number]     Height:       60.0 in Accession #:    [id number]    Weight:       153.3 lb Date of Birth:  08/14/1945      BSA:          1.667 m Patient Age:    74 years      BP:           112/66 mmHg Patient Gender: F             HR:           71 bpm. Exam Location:  Inpatient Procedure: 2D Echo, Cardiac Doppler and Color Doppler Indications:    Acute Coronary Syndrome I24.9  History:        Patient has no prior history of Echocardiogram examinations.                 Risk Factors:Dyslipidemia.  Sonographer:    Elmarie Shileyiffany Dance Referring Phys: [id number] DENNIS NARCISSE JR IMPRESSIONS  1. Left ventricular ejection fraction, by estimation, is 55 to 60%. The left ventricle has normal  function. The left ventricle has no regional wall motion abnormalities. There is moderate asymmetric left ventricular hypertrophy. Left ventricular diastolic parameters are consistent with Grade I diastolic dysfunction (impaired relaxation).  2. Right ventricular systolic function is normal. The right ventricular size is normal.  3. The mitral valve is grossly normal. No evidence of mitral valve regurgitation.  4. The aortic valve was not well visualized. Aortic valve regurgitation is not visualized. No aortic stenosis is present. Comparison(s): No prior Echocardiogram. Conclusion(s)/Recommendation(s): Normal biventricular function without evidence of hemodynamically significant valvular heart disease. FINDINGS  Left Ventricle: Left ventricular ejection fraction, by estimation, is 55 to 60%. The left ventricle has normal function. The left ventricle has no regional wall motion abnormalities. The left ventricular internal cavity size was small. There is moderate  asymmetric left ventricular hypertrophy. Left ventricular diastolic parameters are consistent with Grade I diastolic dysfunction (impaired relaxation). Right Ventricle: The right ventricular size is normal. No increase in right ventricular wall thickness. Right ventricular systolic function is normal. Left Atrium: Left atrial size was normal in size. Right Atrium: Right atrial size was normal in size. Pericardium: There is no evidence of pericardial effusion. Mitral Valve: The mitral valve is grossly normal. No evidence of mitral valve regurgitation. Tricuspid Valve: The tricuspid valve is grossly normal. Tricuspid valve regurgitation is not demonstrated. Aortic Valve: The aortic valve was not well visualized. Aortic valve regurgitation is not visualized. No aortic stenosis is present. Pulmonic Valve: The pulmonic valve was grossly normal. Pulmonic valve regurgitation is not visualized. Aorta: The aortic root and ascending aorta are structurally normal, with  no evidence of dilitation. IAS/Shunts: The atrial septum is grossly normal.  LEFT VENTRICLE PLAX 2D LVIDd:         3.60 cm  Diastology LVIDs:         2.50 cm  LV e' medial:    5.00 cm/s LV PW:         1.50 cm  LV E/e' medial:  12.0 LV IVS:        1.00 cm  LV e' lateral:   6.42 cm/s LVOT diam:     2.00 cm  LV E/e' lateral: 9.3 LV SV:         61 LV SV Index:   37 LVOT Area:     3.14 cm  RIGHT VENTRICLE             IVC RV Basal diam:  2.30 cm     IVC diam: 1.50 cm RV S prime:     14.10 cm/s TAPSE (M-mode): 1.7 cm LEFT ATRIUM             Index       RIGHT ATRIUM           Index LA diam:        2.80 cm 1.68 cm/m  RA Area:     10.80 cm LA Vol (A2C):   33.5 ml 20.09 ml/m RA Volume:   22.50 ml  13.50 ml/m LA Vol (A4C):   23.1 ml 13.86 ml/m LA Biplane Vol: 28.1 ml 16.86 ml/m  AORTIC VALVE LVOT  Vmax:   90.50 cm/s LVOT Vmean:  60.700 cm/s LVOT VTI:    0.194 m  AORTA Ao Root diam: 2.70 cm Ao Asc diam:  3.20 cm MITRAL VALVE MV Area (PHT): 2.34 cm     SHUNTS MV Decel Time: 324 msec     Systemic VTI:  0.19 m MV E velocity: 60.00 cm/s   Systemic Diam: 2.00 cm MV A velocity: 105.00 cm/s MV E/A ratio:  0.57 Riley Lam MD Electronically signed by Riley Lam MD Signature Date/Time: 07/17/2020/12:09:10 PM    Final      I have independently reviewed the above radiologic studies and discussed with the patient   Recent Lab Findings: Lab Results  Component Value Date   WBC 8.5 07/18/2020   HGB 13.4 07/18/2020   HCT 40.6 07/18/2020   PLT 251 07/18/2020   GLUCOSE 119 (H) 07/17/2020   CHOL 254 (H) 07/17/2020   TRIG 153 (H) 07/17/2020   HDL 40 (L) 07/17/2020   LDLCALC 183 (H) 07/17/2020   ALT 30 07/16/2020   AST 30 07/16/2020   NA 139 07/17/2020   K 4.0 07/17/2020   CL 107 07/17/2020   CREATININE 0.96 07/17/2020   BUN 15 07/17/2020   CO2 21 (L) 07/17/2020   TSH 2.699 07/17/2020   INR 1.0 07/17/2020   HGBA1C 5.6 07/17/2020   Procedures  LEFT HEART CATH AND CORONARY ANGIOGRAPHY     Conclusion  1.  Critical stenosis of the proximal LAD/first diagonal that a major bifurcation point 2.  Severe eccentric proximal left circumflex stenosis 3.  Patent right coronary artery with no significant stenosis (dominant vessel) 4.  Normal LV function with LVEF 55 to 65%  Recommend: Cardiac surgical consultation for consideration of CABG.  If patient felt to be a poor candidate for CABG, would attempt bifurcation PCI of the LAD diagonal and PCI the proximal circumflex.  With normal LV function, good angiographic targets, and no major comorbidities I suspect she will be a suitable candidate for coronary bypass surgery.  Indications  Non-ST elevation (NSTEMI) myocardial infarction (HCC) [I21.4 (ICD-10-CM)]   Procedural Details  Technical Details INDICATION: NSTEMI  PROCEDURAL DETAILS: The right wrist is prepped, draped, and anesthetized with 1% lidocaine. Using the modified Seldinger technique, a 5/6 French Slender sheath is introduced into the right radial artery. 3 mg of verapamil is administered through the sheath, weight-based unfractionated heparin was administered intravenously. Standard Judkins catheters are used for selective coronary angiography. LV pressure is recorded and an aortic valve pullback gradient is measured.  Left ventriculography is performed.  Catheter exchanges are performed over an exchange length guidewire. There are no immediate procedural complications. A TR band is used for radial hemostasis at the completion of the procedure.  The patient was transferred to the post catheterization recovery area for further monitoring.    Estimated blood loss <50 mL.   During this procedure medications were administered to achieve and maintain moderate conscious sedation while the patient's heart rate, blood pressure, and oxygen saturation were continuously monitored and I was present face-to-face 100% of this time.   Medications (Filter: Administrations occurring  from [phone] to 0945 on 07/18/20) (important) Continuous medications are totaled by the amount administered until 07/18/20 0945.    midazolam (VERSED) injection (mg) Total dose:  2 mg  Date/Time Rate/Dose/Volume Action   07/18/20 0857 2 mg Given    fentaNYL (SUBLIMAZE) injection (mcg) Total dose:  25 mcg  Date/Time Rate/Dose/Volume Action   07/18/20 0857 25 mcg Given  lidocaine (PF) (XYLOCAINE) 1 % injection (mL) Total volume:  2 mL  Date/Time Rate/Dose/Volume Action   07/18/20 0911 2 mL Given    Heparin (Porcine) in NaCl 1000-0.9 UT/500ML-% SOLN (mL) Total volume:  1,000 mL  Date/Time Rate/Dose/Volume Action   07/18/20 0911 500 mL Given   0911 500 mL Given    Radial Cocktail/Verapamil only (mL) Total volume:  10 mL  Date/Time Rate/Dose/Volume Action   07/18/20 0912 10 mL Given    heparin sodium (porcine) injection (Units) Total dose:  4,000 Units  Date/Time Rate/Dose/Volume Action   07/18/20 0916 4,000 Units Given    iohexol (OMNIPAQUE) 350 MG/ML injection (mL) Total volume:  35 mL  Date/Time Rate/Dose/Volume Action   07/18/20 0936 35 mL Given    acetaminophen (TYLENOL) tablet 650 mg (mg) Total dose:  Cannot be calculated* Dosing weight:  70.3  *Administration dose not documented Date/Time Rate/Dose/Volume Action   07/18/20 0854 *Not included in total MAR Hold    aspirin EC tablet 81 mg (mg) Total dose:  Cannot be calculated* Dosing weight:  70.3  *Administration dose not documented Date/Time Rate/Dose/Volume Action   07/18/20 0854 *Not included in total MAR Hold    atorvastatin (LIPITOR) tablet 80 mg (mg) Total dose:  Cannot be calculated* Dosing weight:  70.3  *Administration dose not documented Date/Time Rate/Dose/Volume Action   07/18/20 0854 *Not included in total MAR Hold    metoprolol tartrate (LOPRESSOR) tablet 25 mg (mg) Total dose:  Cannot be calculated* Dosing weight:  70.3  *Administration dose not documented Date/Time  Rate/Dose/Volume Action   07/18/20 0854 *Not included in total MAR Hold    nitroGLYCERIN (NITROSTAT) SL tablet 0.4 mg (mg) Total dose:  Cannot be calculated* Dosing weight:  70.3  *Administration dose not documented Date/Time Rate/Dose/Volume Action   07/18/20 0854 *Not included in total MAR Hold    nitroGLYCERIN 50 mg in dextrose 5 % 250 mL (0.2 mg/mL) infusion (mcg/min) Total dose:  30.67 mcg Dosing weight:  69.5  Date/Time Rate/Dose/Volume Action   07/18/20 0854 *Not included in total MAR Hold   0907  Stopping Infusion hung by another clincian   0942 10 mcg/min - 3 mL/hr Restarted    ondansetron (ZOFRAN) injection 4 mg (mg) Total dose:  Cannot be calculated* Dosing weight:  70.3  *Administration dose not documented Date/Time Rate/Dose/Volume Action   07/18/20 0854 *Not included in total MAR Hold    Sedation Time  Sedation Time Physician-1: 36 minutes 31 seconds   Contrast  Medication Name Total Dose  iohexol (OMNIPAQUE) 350 MG/ML injection 35 mL    Radiation/Fluoro  Fluoro time: 2.7 (min) DAP: 10532 (mGycm2) Cumulative Air Kerma: 188 (mGy)   Coronary Findings   Diagnostic Dominance: Right  Left Anterior Descending  Prox LAD lesion is 95% stenosed with 95% stenosed side branch in 1st Diag. There is a complex lesion in the proximal LAD that involves the first diagonal branch. The first diagonal branch is large in caliber and supplies a large amount of myocardium. The proximal lesion is eccentric and 95% stenosed. It extends into the LAD beyond the diagonal branch with an acute angulation present  First Diagonal Branch  1st Diag lesion is 90% stenosed.  Left Circumflex  Prox Cx lesion is 75% stenosed. The lesion is eccentric. There is an eccentric 75% stenosis in the proximal circumflex. The first obtuse marginal branch is large in caliber and appears to be a suitable target for bypass grafting.  Right Coronary Artery  The vessel exhibits minimal  luminal  irregularities. Dominant vessel, no obstructive disease, patent throughout its course with minimal irregularity.   Intervention   No interventions have been documented.  Wall Motion  Resting                Left Heart  Left Ventricle The left ventricular size is normal. The left ventricular systolic function is normal. LV end diastolic pressure is normal. The left ventricular ejection fraction is 55-65% by visual estimate. No regional wall motion abnormalities.   Coronary Diagrams   Diagnostic Dominance: Right       Assessment / Plan: Severe two-vessel coronary artery disease. Non-STEMI Hyperlipidemia  She appears to be a suitable candidate to proceed with CABG.  Routine preoperative testing will be done.        I  spent 60 minutes counseling the patient face to face.   Rowe Clack, PA-C 07/18/2020 10:56 AM

## 2020-07-18 NOTE — Progress Notes (Signed)
Patient bagged to room after surgery by CRNA.  Upon arrival I placed patient on the ventilator and secured ET tube with a commercial tube holder for secure placement.

## 2020-07-18 NOTE — Anesthesia Procedure Notes (Signed)
Arterial Line Insertion Start/End12/13/2021 3:24 PM Performed by: Drema Pry, CRNA, CRNA  Patient location: OR. Preanesthetic checklist: patient identified, IV checked, site marked, risks and benefits discussed, surgical consent, monitors and equipment checked, pre-op evaluation, timeout performed and anesthesia consent Left, radial was placed Catheter size: 20 G Hand hygiene performed  and maximum sterile barriers used   Attempts: 1 Procedure performed without using ultrasound guided technique. Following insertion, dressing applied and Biopatch. Post procedure assessment: normal and unchanged  Patient tolerated the procedure well with no immediate complications.

## 2020-07-18 NOTE — Interval H&P Note (Signed)
Cath Lab Visit (complete for each Cath Lab visit)  Clinical Evaluation Leading to the Procedure:   ACS: Yes.    Non-ACS:    Anginal Classification: CCS IV  Anti-ischemic medical therapy: Minimal Therapy (1 class of medications)  Non-Invasive Test Results: No non-invasive testing performed  Prior CABG: No previous CABG      History and Physical Interval Note:  07/18/2020 8:51 AM  [name]  has presented today for surgery, with the diagnosis of NSTEMI.  The various methods of treatment have been discussed with the patient and family. After consideration of risks, benefits and other options for treatment, the patient has consented to  Procedure(s): LEFT HEART CATH AND CORONARY ANGIOGRAPHY (N/A) as a surgical intervention.  The patient's history has been reviewed, patient examined, no change in status, stable for surgery.  I have reviewed the patient's chart and labs.  Questions were answered to the patient's satisfaction.     Tonny Bollman

## 2020-07-18 NOTE — Progress Notes (Signed)
ANTICOAGULATION CONSULT NOTE  Pharmacy Consult for heparin  Indication: chest pain/ACS   Vital Signs: Temp: 98.6 F (37 C) (12/13 1000) Temp Source: Oral (12/13 1000) BP: 134/75 (12/13 1010) Pulse Rate: 76 (12/13 1010)  Labs: Recent Labs    07/16/20 1807 07/16/20 1934 07/17/20 1015 07/17/20 1326 07/18/20 0235  HGB 14.5  --  13.6  --  13.4  HCT 42.9  --  42.3  --  40.6  PLT 252  --  248  --  251  LABPROT  --   --  12.9  --   --   INR  --   --  1.0  --   --   HEPARINUNFRC  --   --  0.38  --  0.32  CREATININE 0.94  --  0.96  --   --   TROPONINIHS 136*   < > 321* 290* 174*   < > = values in this interval not displayed.    Estimated Creatinine Clearance: 46.3 mL/min (by C-G formula based on SCr of 0.96 mg/dL).   Medical History: Past Medical History:  Diagnosis Date  . Hyperlipidemia     Assessment: 74 yo female with CP on heparin for r/o ACS. No oral anticoagulants noted PTA.   Heparin level is therapeutic at 0.32 on 900 units/hr. CBC WNL.   Heparin turned off this AM for cath - now planning CABG consult.  Pharmacy asked to resume IV heparin 2 hrs after TR band removed.  Goal of Therapy:  Heparin level 0.3-0.7 units/ml Monitor platelets by anticoagulation protocol: Yes   Plan:  Resume heparin 2 hrs after TR band - just came back from cath lab with 10 cc air in band, will check back in a couple of hours to see when/if removed yet.  Reece Leader, Colon Flattery, [facility] Clinical Pharmacist  07/18/2020 10:49 AM   [facility] pharmacy phone numbers are listed on [email or web address]

## 2020-07-18 NOTE — H&P (View-Only) (Signed)
Progress Note  Patient Name: Brenda Wilkins Date of Encounter: 07/18/2020  [facility] Cardiologist: No primary care provider on file. New  Subjective   Patient had recurrent chest pain last night radiating to back and into shoulder. Associated with SOB, sweating, and dry heaves. Pain is much better now but still slight tightness.   Inpatient Medications    Scheduled Meds: . aspirin EC  81 mg Oral Daily  . atorvastatin  80 mg Oral Daily  . metoprolol tartrate  25 mg Oral BID  . sodium chloride flush  3 mL Intravenous Q12H   Continuous Infusions: . sodium chloride    . sodium chloride 1 mL/kg/hr (07/18/20 0535)  . heparin 900 Units/hr (07/17/20 1656)  . nitroGLYCERIN 5 mcg/min (07/18/20 0207)   PRN Meds: sodium chloride, acetaminophen, nitroGLYCERIN, ondansetron (ZOFRAN) IV, sodium chloride flush   Vital Signs    Vitals:   07/18/20 0131 07/18/20 0147 07/18/20 0152 07/18/20 0400  BP: 134/75 (!) 144/80 139/70 116/63  Pulse: (!) 59 (!) 55 (!) 53 (!) 59  Resp:    15  Temp:    98.6 F (37 C)  TempSrc:    Oral  SpO2: 100% 100% 100% 96%  Weight:    70.2 kg  Height:        Intake/Output Summary (Last 24 hours) at 07/18/2020 0738 Last data filed at 07/18/2020 0700 Gross per 24 hour  Intake 123.45 ml  Output 1450 ml  Net -1326.55 ml   Last 3 Weights 07/18/2020 07/17/2020 07/16/2020  Weight (lbs) 154 lb 12.8 oz 153 lb 4.8 oz 152 lb 1.9 oz  Weight (kg) 70.217 kg 69.536 kg 69 kg      Telemetry    NSR - Personally Reviewed  ECG    Sinus brady. No acute change - Personally Reviewed  Physical Exam   GEN: No acute distress.   Neck: No JVD Cardiac: RRR, no murmurs, rubs, or gallops. No edema. Pulses 2+ throughout. Respiratory: Clear to auscultation bilaterally. GI: Soft, nontender, non-distended  MS: No edema; No deformity. Neuro:  Nonfocal  Psych: Normal affect   Labs    High Sensitivity Troponin:   Recent Labs  Lab 07/16/20 1807 07/16/20 1934  07/17/20 1015 07/17/20 1326 07/18/20 0235  TROPONINIHS 136* 227* 321* 290* 174*      Chemistry Recent Labs  Lab 07/16/20 1807 07/17/20 1015  NA 140 139  K 3.9 4.0  CL 108 107  CO2 21* 21*  GLUCOSE 98 119*  BUN 12 15  CREATININE 0.94 0.96  CALCIUM 8.9 8.7*  PROT 7.2  --   ALBUMIN 3.7  --   AST 30  --   ALT 30  --   ALKPHOS 117  --   BILITOT 0.7  --   GFRNONAA >60 >60  ANIONGAP 11 11     Hematology Recent Labs  Lab 07/16/20 1807 07/17/20 1015 07/18/20 0235  WBC 9.5 10.1 8.5  RBC 4.80 4.69 4.49  HGB 14.5 13.6 13.4  HCT 42.9 42.3 40.6  MCV 89.4 90.2 90.4  MCH 30.2 29.0 29.8  MCHC 33.8 32.2 33.0  RDW 12.9 13.2 13.2  PLT 252 248 251    BNPNo results for input(s): BNP, PROBNP in the last 168 hours.   DDimer No results for input(s): DDIMER in the last 168 hours.   Radiology    DG Chest Port 1 View  Result Date: 07/16/2020 CLINICAL DATA:  Chest pain. EXAM: PORTABLE CHEST 1 VIEW COMPARISON:  None. FINDINGS: The cardiomediastinal  contours are normal. There is mild interstitial coarsening of unknown chronicity. Pulmonary vasculature is normal. No consolidation, pleural effusion, or pneumothorax. No acute osseous abnormalities are seen. IMPRESSION: Mild interstitial coarsening of unknown chronicity. This may be chronic or represent atypical infection. Electronically Signed   By: Narda Rutherford M.D.   On: 07/16/2020 17:58   ECHOCARDIOGRAM COMPLETE  Result Date: 07/17/2020    ECHOCARDIOGRAM REPORT   Patient Name:   Brenda Wilkins Date of Exam: 07/17/2020 Medical Rec #:  [id number]     Height:       60.0 in Accession #:    [id number]    Weight:       153.3 lb Date of Birth:  [birth year]      BSA:          1.667 m Patient Age:    74 years      BP:           112/66 mmHg Patient Gender: F             HR:           71 bpm. Exam Location:  Inpatient Procedure: 2D Echo, Cardiac Doppler and Color Doppler Indications:    Acute Coronary Syndrome I24.9  History:        Patient has no  prior history of Echocardiogram examinations.                 Risk Factors:Dyslipidemia.  Sonographer:    Elmarie Shiley Dance Referring Phys: [id number] DENNIS NARCISSE JR IMPRESSIONS  1. Left ventricular ejection fraction, by estimation, is 55 to 60%. The left ventricle has normal function. The left ventricle has no regional wall motion abnormalities. There is moderate asymmetric left ventricular hypertrophy. Left ventricular diastolic parameters are consistent with Grade I diastolic dysfunction (impaired relaxation).  2. Right ventricular systolic function is normal. The right ventricular size is normal.  3. The mitral valve is grossly normal. No evidence of mitral valve regurgitation.  4. The aortic valve was not well visualized. Aortic valve regurgitation is not visualized. No aortic stenosis is present. Comparison(s): No prior Echocardiogram. Conclusion(s)/Recommendation(s): Normal biventricular function without evidence of hemodynamically significant valvular heart disease. FINDINGS  Left Ventricle: Left ventricular ejection fraction, by estimation, is 55 to 60%. The left ventricle has normal function. The left ventricle has no regional wall motion abnormalities. The left ventricular internal cavity size was small. There is moderate  asymmetric left ventricular hypertrophy. Left ventricular diastolic parameters are consistent with Grade I diastolic dysfunction (impaired relaxation). Right Ventricle: The right ventricular size is normal. No increase in right ventricular wall thickness. Right ventricular systolic function is normal. Left Atrium: Left atrial size was normal in size. Right Atrium: Right atrial size was normal in size. Pericardium: There is no evidence of pericardial effusion. Mitral Valve: The mitral valve is grossly normal. No evidence of mitral valve regurgitation. Tricuspid Valve: The tricuspid valve is grossly normal. Tricuspid valve regurgitation is not demonstrated. Aortic Valve: The aortic valve was  not well visualized. Aortic valve regurgitation is not visualized. No aortic stenosis is present. Pulmonic Valve: The pulmonic valve was grossly normal. Pulmonic valve regurgitation is not visualized. Aorta: The aortic root and ascending aorta are structurally normal, with no evidence of dilitation. IAS/Shunts: The atrial septum is grossly normal.  LEFT VENTRICLE PLAX 2D LVIDd:         3.60 cm  Diastology LVIDs:         2.50 cm  LV e' medial:    5.00  cm/s LV PW:         1.50 cm  LV E/e' medial:  12.0 LV IVS:        1.00 cm  LV e' lateral:   6.42 cm/s LVOT diam:     2.00 cm  LV E/e' lateral: 9.3 LV SV:         61 LV SV Index:   37 LVOT Area:     3.14 cm  RIGHT VENTRICLE             IVC RV Basal diam:  2.30 cm     IVC diam: 1.50 cm RV S prime:     14.10 cm/s TAPSE (M-mode): 1.7 cm LEFT ATRIUM             Index       RIGHT ATRIUM           Index LA diam:        2.80 cm 1.68 cm/m  RA Area:     10.80 cm LA Vol (A2C):   33.5 ml 20.09 ml/m RA Volume:   22.50 ml  13.50 ml/m LA Vol (A4C):   23.1 ml 13.86 ml/m LA Biplane Vol: 28.1 ml 16.86 ml/m  AORTIC VALVE LVOT Vmax:   90.50 cm/s LVOT Vmean:  60.700 cm/s LVOT VTI:    0.194 m  AORTA Ao Root diam: 2.70 cm Ao Asc diam:  3.20 cm MITRAL VALVE MV Area (PHT): 2.34 cm     SHUNTS MV Decel Time: 324 msec     Systemic VTI:  0.19 m MV E velocity: 60.00 cm/s   Systemic Diam: 2.00 cm MV A velocity: 105.00 cm/s MV E/A ratio:  0.57 Riley Lam MD Electronically signed by Riley Lam MD Signature Date/Time: 07/17/2020/12:09:10 PM    Final     Cardiac Studies   Echo see above  Patient Profile     74 y.o. female with HLD and family history of CAD presents with typical angina and NSTEMI  Assessment & Plan    1. NSTEMI. Peak troponin 321 with fairly flat trend. Ecg without acute ST-T changes. Echo showed normal LV function. Patient with recurrent pain last pm despite good medical therapy. Continue ASA, statin, metoprolol and IV Ntg. Plan cardiac cath this  am. The procedure and risks were reviewed including but not limited to death, myocardial infarction, stroke, arrythmias, bleeding, transfusion, emergency surgery, dye allergy, or renal dysfunction. The patient voices understanding and is agreeable to proceed.. 2. Hypercholesterolemia. Total cholesterol 254 with LDL 183. ? Familial hypercholesterolemia. On high dose statin now.  3. Family history of CAD  For questions or updates, please contact [facility] Please consult [email or web address] for contact info under        Signed, Davie Claud [place], MD  07/18/2020, 7:38 AM

## 2020-07-18 NOTE — Transfer of Care (Signed)
Immediate Anesthesia Transfer of Care Note  Patient: Brenda Wilkins  Procedure(s) Performed: CORONARY ARTERY BYPASS GRAFTING (CABG), ON PUMP, TIMES THREE, USING ENDOSCOPICALLY HARVESTED RIGHT GREATER SAPHENOUS VEIN (N/A Chest) TRANSESOPHAGEAL ECHOCARDIOGRAM (TEE) (N/A )  Patient Location: ICU  Anesthesia Type:General  Level of Consciousness: Patient remains intubated per anesthesia plan  Airway & Oxygen Therapy: Patient remains intubated per anesthesia plan and Patient placed on Ventilator (see vital sign flow sheet for setting)  Post-op Assessment: Report given to RN and Post -op Vital signs reviewed and stable  Post vital signs: Reviewed and stable  Last Vitals:  Vitals Value Taken Time  BP 98/68   Temp    Pulse 114   Resp 15   SpO2 100     Last Pain:  Vitals:   07/18/20 1501  TempSrc:   PainSc: 6          Complications: No complications documented.

## 2020-07-18 NOTE — Progress Notes (Signed)
At 0105 patient c/o chest pain at level 9. Nitro Sl given x 3 with slight relief level 5. Ekg obtained. On call cardiology notified. Nitro gtt, 4mg  of MSO4 IV and repeat EKG ordered. Chest pain resolved and patient resting quietly.

## 2020-07-18 NOTE — Progress Notes (Signed)
Echocardiogram Echocardiogram Transesophageal has been performed.  Brenda Wilkins Brenda Wilkins 07/18/2020, 3:54 PM

## 2020-07-18 NOTE — Op Note (Addendum)
CARDIOVASCULAR SURGERY OPERATIVE NOTE  07/18/2020  Surgeon:  Alleen Borne, MD  First Assistant: Gershon Crane,  PA-C   Preoperative Diagnosis:  Severe two-vessel coronary artery disease with acute anterior STEMI due to LAD occlusion.   Postoperative Diagnosis:  Same   Procedure:  1. Emergent Median Sternotomy 2. Extracorporeal circulation 3.   Coronary artery bypass grafting x 3   Saphenous vein graft to the LAD  Sequential SVG to diagonal and OM.   4.   Endoscopic vein harvest from the right leg   Anesthesia:  General Endotracheal   Clinical History/Surgical Indication:  The patient is a 74 year old female who presented to emergency room on 07/17/2019 with a 2 to 3-day history of worsening chest pain.  She ruled in for non-ST segment elevation MI with a peak troponin of 321.  An echocardiogram showed normal left ventricular ejection fraction of 55 to 60%.  There was no significant valvular disease.  She was seen in the Cath Lab this morning which 95% LAD bifurcation stenosis at the takeoff of a large diagonal branch.  There is also about 75% proximal left circumflex stenosis.  The right coronary artery had no significant disease.  She was seen in consultation by one of our PAs earlier today and was doing well with no chest pain.  This afternoon she developed acute 10 out of 10 chest pain with acute EKG changes and was taken to Cath Lab urgently.  Cardiac catheterization showed occlusion of the LAD diagonal bifurcation.  It was difficult to get a wire to pass down through the stenosis but Dr. [place] was able to get a wire into the diagonal branch and restore antegrade flow down the LAD and diagonal branch.  The patient made hemodynamically stable and was taken the operating room with a wire down the diagonal branch.  I discussed the operative procedure with the patient and her daughter  including alternatives, benefits and risks; including but not limited to bleeding, blood transfusion, infection, stroke, myocardial infarction, graft failure, heart block requiring a permanent pacemaker, organ dysfunction, and death.  [name] understands and agrees to proceed.     Preparation:  The patient was taken directly to the operating room.  Preoperative antibiotics were given.  The patient was placed under general endotracheal anesthesia.  A pulmonary arterial line and radial arterial line were placed by the anesthesia team.  A foley catheter was placed. The neck, chest, abdomen, and both legs were prepped with betadine soap and solution and draped in the usual sterile manner. A surgical time-out was taken and the correct patient and operative procedure were confirmed with the nursing and anesthesia staff.  TEE: performed by Dr. Adonis Huguenin. This showed an EF of 40% with hypokinesis of the anteroseptal and anterior/apical walls. There was no significant valvular disease.   Cardiopulmonary Bypass:  A median sternotomy was performed. The pericardium was opened in the midline. Right ventricular function appeared normal. The ascending aorta was of normal size and had no palpable plaque. There were no contraindications to aortic cannulation or cross-clamping.  The left internal mammary artery and right greater saphenous vein were harvested as noted below.  At the conclusion of the left internal mammary harvest the patient was developing some hypotension and a widening of her ECG complexes.  This progressed to profound hypotension despite a bolus of epinephrine.  TEE showed the heart was not moving much at all.  The patient was fully systemically heparinized and the ACT was maintained > 400 sec.  The proximal aortic arch was quickly cannulated with a 20 F aortic cannula for arterial inflow. Venous cannulation was performed via the right atrial appendage using a two-staged venous cannula.  The  patient was placed on cardiopulmonary bypass.  I had to place cannulas and crash on pump as quickly as possible due to nonpulsatile blood pressure and no cardiac contraction seen on TEE.  An antegrade cardioplegia/vent cannula was inserted into the mid-ascending aorta.  A left ventricular vent was placed via the right superior pulmonary vein to decompress left ventricle since the PA pressures went up to properly.  A retrograde cardioplegia cannula was inserted through the right atrium into the coronary sinus.  Aortic occlusion was performed with a single cross-clamp. Systemic cooling to 32 degrees Centigrade and topical cooling of the heart with iced saline were used. Hyperkalemic antegrade cold blood cardioplegia was used to induce diastolic arrest followed by cold blood retrograde cardioplegia which was then given at about 20 minute intervals throughout the period of arrest to maintain myocardial temperature at or below 10 degrees centigrade. A temperature probe was inserted into the interventricular septum and an insulating pad was placed in the pericardium.   Left internal mammary artery harvest:  The left side of the sternum was retracted using the Rultract retractor. The left internal mammary artery was harvested as a pedicle graft. All side branches were clipped. It was a small-sized vessel of good quality with suboptimal blood flow.  I did not feel it was suitable to revascularize the LAD particularly under these adverse circumstances.  It was ligated distally and divided.  The proximal end was suture ligated and divided.   Endoscopic vein harvest:  The right greater saphenous vein was harvested endoscopically through a 2 cm incision medial to the right knee. It was harvested from the upper thigh to below the knee. It was a medium-sized vein of good quality. The side branches were all ligated with 4-0 silk ties.    Coronary arteries:  The coronary arteries were examined.   LAD: There was a  significant amount of ecchymosis over the course of the proximal and mid LAD which I suspect was due to the attempt to cross the complex LAD stenosis.  The vessel beyond this was a medium caliber vessel with no significant disease.  The diagonal branch was a medium caliber vessel with no distal disease.  LCX: The large obtuse marginal branch had no distal disease.  RCA: No disease on cath.   Grafts:  1. SVG to the LAD: 1.75 mm. It was sewn end to side using 7-0 prolene continuous suture. 2. Sequential SVG to diagonal:  1.75 mm. It was sewn sequential side to side using 7-0 prolene continuous suture. 3. Sequential SVG to OM:  2.0 mm. It was sewn end to side using 7-0 prolene continuous suture.   The proximal vein graft anastomoses were performed to the mid-ascending aorta using continuous 6-0 prolene suture. Graft markers were placed around the proximal anastomoses.   Completion:  The patient was rewarmed to 37 degrees Centigrade. The crossclamp was removed with a time of 68 minutes. There was spontaneous return of sinus rhythm. The distal and proximal anastomoses were checked for hemostasis. The position of the grafts was satisfactory. Two temporary epicardial pacing wires were placed on the right atrium and two on the right ventricle. The patient was weaned from CPB without difficulty on milrinone 0.25 and dop 3. CPB time was 103 minutes. Cardiac output was 5 LPM. TEE showed improved  LV systolic function. Heparin was fully reversed with protamine and the aortic and venous cannulas removed. Hemostasis was achieved. Mediastinal and left pleural drainage tubes were placed. The sternum was closed with double #6 stainless steel wires. The fascia was closed with continuous # 1 vicryl suture. The subcutaneous tissue was closed with 2-0 vicryl continuous suture. The skin was closed with 3-0 vicryl subcuticular suture. All sponge, needle, and instrument counts were reported correct at the end of the case.  Dry sterile dressings were placed over the incisions and around the chest tubes which were connected to pleurevac suction. The patient was then transported to the surgical intensive care unit in critical but stable condition.

## 2020-07-18 NOTE — Progress Notes (Signed)
    Called by RN with patient experiencing 7/10 chest pain while on IV nitro. Had also been given SL nitro. I came to the bedside and patient reporting worsening chest pain and diaphoresis, despite increasing IV nitro. EKG done per order showing subtle changes with slight ST elevation in v2-v3 and TWI with depression in inferior leads. Given IV morphine 1mg  x2. Attending called to the bedside. Dr. at the bedside with decision to take back to the cath lab.   Signed[place], NP-C 07/18/2020, 2:45 PM Pager: [phone]

## 2020-07-18 NOTE — Anesthesia Procedure Notes (Addendum)
Central Venous Catheter Insertion Performed by: Duane Boston, MD, anesthesiologist Start/End12/13/2021 3:26 PM, 07/18/2020 3:36 PM Patient location: Pre-op. Preanesthetic checklist: patient identified, IV checked, site marked, risks and benefits discussed, surgical consent, monitors and equipment checked, pre-op evaluation, timeout performed and anesthesia consent Position: Trendelenburg Lidocaine 1% used for infiltration and patient sedated Hand hygiene performed , maximum sterile barriers used  and Seldinger technique used Catheter size: 9 Fr Total catheter length 8. PA cath was placed.MAC introducer Swan type:thermodilution PA Cath depth:50 Procedure performed using ultrasound guided technique. Ultrasound Notes:anatomy identified, needle tip was noted to be adjacent to the nerve/plexus identified, no ultrasound evidence of intravascular and/or intraneural injection and image(s) printed for medical record Attempts: 1 Following insertion, line sutured and dressing applied. Post procedure assessment: free fluid flow, blood return through all ports and no air  Patient tolerated the procedure well with no immediate complications.

## 2020-07-18 NOTE — Anesthesia Procedure Notes (Addendum)
Procedure Name: Intubation Date/Time: 07/18/2020 3:23 PM Performed by: Rosiland Oz, CRNA Pre-anesthesia Checklist: Patient identified, Emergency Drugs available, Suction available and Patient being monitored Patient Re-evaluated:Patient Re-evaluated prior to induction Oxygen Delivery Method: Circle System Utilized Preoxygenation: Pre-oxygenation with 100% oxygen Induction Type: IV induction, Rapid sequence and Cricoid Pressure applied Laryngoscope Size: Miller and 2 Grade View: Grade II Tube type: Oral Tube size: 7.5 mm Number of attempts: 1 Airway Equipment and Method: Stylet Placement Confirmation: ETT inserted through vocal cords under direct vision,  positive ETCO2 and breath sounds checked- equal and bilateral Secured at: 22 cm Tube secured with: Tape Dental Injury: Teeth and Oropharynx as per pre-operative assessment

## 2020-07-19 ENCOUNTER — Encounter (HOSPITAL_COMMUNITY): Payer: Self-pay | Admitting: Cardiology

## 2020-07-19 ENCOUNTER — Inpatient Hospital Stay (HOSPITAL_COMMUNITY): Payer: Medicare HMO

## 2020-07-19 DIAGNOSIS — Z951 Presence of aortocoronary bypass graft: Secondary | ICD-10-CM

## 2020-07-19 LAB — GLUCOSE, CAPILLARY
Glucose-Capillary: 100 mg/dL — ABNORMAL HIGH (ref 70–99)
Glucose-Capillary: 101 mg/dL — ABNORMAL HIGH (ref 70–99)
Glucose-Capillary: 118 mg/dL — ABNORMAL HIGH (ref 70–99)
Glucose-Capillary: 119 mg/dL — ABNORMAL HIGH (ref 70–99)
Glucose-Capillary: 128 mg/dL — ABNORMAL HIGH (ref 70–99)
Glucose-Capillary: 128 mg/dL — ABNORMAL HIGH (ref 70–99)
Glucose-Capillary: 130 mg/dL — ABNORMAL HIGH (ref 70–99)
Glucose-Capillary: 132 mg/dL — ABNORMAL HIGH (ref 70–99)
Glucose-Capillary: 133 mg/dL — ABNORMAL HIGH (ref 70–99)
Glucose-Capillary: 137 mg/dL — ABNORMAL HIGH (ref 70–99)
Glucose-Capillary: 138 mg/dL — ABNORMAL HIGH (ref 70–99)
Glucose-Capillary: 138 mg/dL — ABNORMAL HIGH (ref 70–99)
Glucose-Capillary: 164 mg/dL — ABNORMAL HIGH (ref 70–99)
Glucose-Capillary: 164 mg/dL — ABNORMAL HIGH (ref 70–99)

## 2020-07-19 LAB — CBC
HCT: 31 % — ABNORMAL LOW (ref 36.0–46.0)
HCT: 30.3 % — ABNORMAL LOW (ref 36.0–46.0)
Hemoglobin: 10.3 g/dL — ABNORMAL LOW (ref 12.0–15.0)
Hemoglobin: 9.6 g/dL — ABNORMAL LOW (ref 12.0–15.0)
MCH: 29.9 pg (ref 26.0–34.0)
MCH: 29.3 pg (ref 26.0–34.0)
MCHC: 33.2 g/dL (ref 30.0–36.0)
MCHC: 31.7 g/dL (ref 30.0–36.0)
MCV: 90.1 fL (ref 80.0–100.0)
MCV: 92.4 fL (ref 80.0–100.0)
Platelets: 165 10*3/uL (ref 150–400)
Platelets: 157 10*3/uL (ref 150–400)
RBC: 3.44 MIL/uL — ABNORMAL LOW (ref 3.87–5.11)
RBC: 3.28 MIL/uL — ABNORMAL LOW (ref 3.87–5.11)
RDW: 13.2 % (ref 11.5–15.5)
RDW: 13.4 % (ref 11.5–15.5)
WBC: 17.1 10*3/uL — ABNORMAL HIGH (ref 4.0–10.5)
WBC: 14.9 10*3/uL — ABNORMAL HIGH (ref 4.0–10.5)
nRBC: 0 % (ref 0.0–0.2)
nRBC: 0 % (ref 0.0–0.2)

## 2020-07-19 LAB — POCT I-STAT 7, (LYTES, BLD GAS, ICA,H+H)
Acid-Base Excess: 2 mmol/L (ref 0.0–2.0)
Acid-Base Excess: 1 mmol/L (ref 0.0–2.0)
Acid-Base Excess: 0 mmol/L (ref 0.0–2.0)
Acid-base deficit: 1 mmol/L (ref 0.0–2.0)
Bicarbonate: 28 mmol/L (ref 20.0–28.0)
Bicarbonate: 26.2 mmol/L (ref 20.0–28.0)
Bicarbonate: 25.2 mmol/L (ref 20.0–28.0)
Bicarbonate: 24.7 mmol/L (ref 20.0–28.0)
Calcium, Ion: 1.15 mmol/L (ref 1.15–1.40)
Calcium, Ion: 1.14 mmol/L — ABNORMAL LOW (ref 1.15–1.40)
Calcium, Ion: 1.15 mmol/L (ref 1.15–1.40)
Calcium, Ion: 1.14 mmol/L — ABNORMAL LOW (ref 1.15–1.40)
HCT: 29 % — ABNORMAL LOW (ref 36.0–46.0)
HCT: 29 % — ABNORMAL LOW (ref 36.0–46.0)
HCT: 27 % — ABNORMAL LOW (ref 36.0–46.0)
HCT: 28 % — ABNORMAL LOW (ref 36.0–46.0)
Hemoglobin: 9.9 g/dL — ABNORMAL LOW (ref 12.0–15.0)
Hemoglobin: 9.9 g/dL — ABNORMAL LOW (ref 12.0–15.0)
Hemoglobin: 9.2 g/dL — ABNORMAL LOW (ref 12.0–15.0)
Hemoglobin: 9.5 g/dL — ABNORMAL LOW (ref 12.0–15.0)
O2 Saturation: 98 %
O2 Saturation: 98 %
O2 Saturation: 97 %
O2 Saturation: 96 %
Patient temperature: 37.8
Patient temperature: 37.9
Patient temperature: 37.9
Patient temperature: 37.8
Potassium: 4.2 mmol/L (ref 3.5–5.1)
Potassium: 4.4 mmol/L (ref 3.5–5.1)
Potassium: 4.2 mmol/L (ref 3.5–5.1)
Potassium: 4.2 mmol/L (ref 3.5–5.1)
Sodium: 144 mmol/L (ref 135–145)
Sodium: 144 mmol/L (ref 135–145)
Sodium: 142 mmol/L (ref 135–145)
Sodium: 142 mmol/L (ref 135–145)
TCO2: 29 mmol/L (ref 22–32)
TCO2: 27 mmol/L (ref 22–32)
TCO2: 26 mmol/L (ref 22–32)
TCO2: 26 mmol/L (ref 22–32)
pCO2 arterial: 48.5 mmHg — ABNORMAL HIGH (ref 32.0–48.0)
pCO2 arterial: 45.1 mmHg (ref 32.0–48.0)
pCO2 arterial: 42.2 mmHg (ref 32.0–48.0)
pCO2 arterial: 44.8 mmHg (ref 32.0–48.0)
pH, Arterial: 7.372 (ref 7.350–7.450)
pH, Arterial: 7.376 (ref 7.350–7.450)
pH, Arterial: 7.387 (ref 7.350–7.450)
pH, Arterial: 7.353 (ref 7.350–7.450)
pO2, Arterial: 116 mmHg — ABNORMAL HIGH (ref 83.0–108.0)
pO2, Arterial: 120 mmHg — ABNORMAL HIGH (ref 83.0–108.0)
pO2, Arterial: 97 mmHg (ref 83.0–108.0)
pO2, Arterial: 88 mmHg (ref 83.0–108.0)

## 2020-07-19 LAB — BASIC METABOLIC PANEL
Anion gap: 10 (ref 5–15)
BUN: 11 mg/dL (ref 8–23)
CO2: 24 mmol/L (ref 22–32)
Calcium: 8.1 mg/dL — ABNORMAL LOW (ref 8.9–10.3)
Chloride: 108 mmol/L (ref 98–111)
Creatinine, Ser: 0.95 mg/dL (ref 0.44–1.00)
GFR, Estimated: >60 mL/min (ref >60)
Glucose, Bld: 127 mg/dL — ABNORMAL HIGH (ref 70–99)
Potassium: 4.1 mmol/L (ref 3.5–5.1)
Sodium: 142 mmol/L (ref 135–145)

## 2020-07-19 LAB — BASIC METABOLIC PANEL WITH GFR
Anion gap: 8 (ref 5–15)
BUN: 11 mg/dL (ref 8–23)
CO2: 23 mmol/L (ref 22–32)
Calcium: 7.6 mg/dL — ABNORMAL LOW (ref 8.9–10.3)
Chloride: 108 mmol/L (ref 98–111)
Creatinine, Ser: 0.95 mg/dL (ref 0.44–1.00)
GFR, Estimated: >60 mL/min
Glucose, Bld: 152 mg/dL — ABNORMAL HIGH (ref 70–99)
Potassium: 4.2 mmol/L (ref 3.5–5.1)
Sodium: 139 mmol/L (ref 135–145)

## 2020-07-19 LAB — MAGNESIUM
Magnesium: 2.8 mg/dL — ABNORMAL HIGH (ref 1.7–2.4)
Magnesium: 2.3 mg/dL (ref 1.7–2.4)

## 2020-07-19 LAB — SURGICAL PCR SCREEN
MRSA, PCR: NEGATIVE
Staphylococcus aureus: NEGATIVE

## 2020-07-19 MED ORDER — MUPIROCIN 2 % EX OINT: 1.0000 "application " | TOPICAL_OINTMENT | Freq: Two times a day (BID) | CUTANEOUS | Status: DC

## 2020-07-19 MED ORDER — NOREPINEPHRINE 16 MG/250ML-% IV SOLN: 0.0000 ug/min | INTRAVENOUS | Status: DC

## 2020-07-19 MED ORDER — ORAL CARE MOUTH RINSE
15.0000 mL | OROMUCOSAL | Status: DC
  Administered 2020-07-19 (×2): 15 mL via OROMUCOSAL

## 2020-07-19 MED ORDER — ENOXAPARIN SODIUM 40 MG/0.4ML ~~LOC~~ SOLN
40.0000 mg | Freq: Every day | SUBCUTANEOUS | Status: DC
  Administered 2020-07-19: 21:00:00 40 mg via SUBCUTANEOUS
  Filled 2020-07-19: qty 0.4

## 2020-07-19 MED ORDER — SODIUM CHLORIDE 0.9% FLUSH
10.0000 mL | Freq: Two times a day (BID) | INTRAVENOUS | Status: DC
  Administered 2020-07-19 – 2020-07-21 (×6): 10 mL

## 2020-07-19 MED ORDER — ORAL CARE MOUTH RINSE
15.0000 mL | Freq: Two times a day (BID) | OROMUCOSAL | Status: DC
  Administered 2020-07-19 – 2020-07-25 (×7): 15 mL via OROMUCOSAL

## 2020-07-19 MED ORDER — CHLORHEXIDINE GLUCONATE 0.12% ORAL RINSE (MEDLINE KIT)
15.0000 mL | Freq: Two times a day (BID) | OROMUCOSAL | Status: DC
  Administered 2020-07-19: 08:00:00 15 mL via OROMUCOSAL

## 2020-07-19 MED ORDER — INSULIN ASPART 100 UNIT/ML ~~LOC~~ SOLN
0.0000 [IU] | SUBCUTANEOUS | Status: DC
  Administered 2020-07-19 – 2020-07-20 (×2): 2 [IU] via SUBCUTANEOUS

## 2020-07-19 MED ORDER — NOREPINEPHRINE 4 MG/250ML-% IV SOLN
INTRAVENOUS | Status: AC
  Filled 2020-07-19: qty 250

## 2020-07-19 MED ORDER — ATROPINE SULFATE 1 MG/10ML IJ SOSY
PREFILLED_SYRINGE | INTRAMUSCULAR | Status: AC
  Filled 2020-07-19: qty 10

## 2020-07-19 MED ORDER — NOREPINEPHRINE 4 MG/250ML-% IV SOLN
0.0000 ug/min | INTRAVENOUS | Status: DC
  Administered 2020-07-19 (×2): 1 ug/min via INTRAVENOUS
  Administered 2020-07-19: 07:00:00 3 ug/min via INTRAVENOUS
  Administered 2020-07-21: 03:00:00 2 ug/min via INTRAVENOUS
  Filled 2020-07-19 (×2): qty 250

## 2020-07-19 MED ORDER — SODIUM CHLORIDE 0.9% FLUSH: 10.0000 mL | INTRAVENOUS | Status: DC | PRN

## 2020-07-19 MED ORDER — INSULIN DETEMIR 100 UNIT/ML ~~LOC~~ SOLN
20.0000 [IU] | Freq: Every day | SUBCUTANEOUS | Status: DC
  Filled 2020-07-19: qty 0.2

## 2020-07-19 MED ORDER — TRAMADOL HCL 50 MG PO TABS
50.0000 mg | ORAL_TABLET | ORAL | Status: DC | PRN
Start: 2020-07-19 — End: 2020-07-22
  Administered 2020-07-19: 50 mg via ORAL
  Filled 2020-07-19: qty 1

## 2020-07-19 MED ORDER — INSULIN DETEMIR 100 UNIT/ML ~~LOC~~ SOLN
20.0000 [IU] | Freq: Every day | SUBCUTANEOUS | Status: DC
  Administered 2020-07-19: 11:00:00 20 [IU] via SUBCUTANEOUS
  Filled 2020-07-19 (×2): qty 0.2

## 2020-07-19 MED FILL — Magnesium Sulfate Inj 50%: INTRAMUSCULAR | Qty: 10 | Status: AC

## 2020-07-19 MED FILL — Lidocaine HCl Local Soln Prefilled Syringe 100 MG/5ML (2%): INTRAMUSCULAR | Qty: 5 | Status: AC

## 2020-07-19 MED FILL — Sodium Bicarbonate IV Soln 8.4%: INTRAVENOUS | Qty: 100 | Status: AC

## 2020-07-19 MED FILL — Potassium Chloride Inj 2 mEq/ML: INTRAVENOUS | Qty: 40 | Status: AC

## 2020-07-19 MED FILL — Albumin, Human Inj 5%: INTRAVENOUS | Qty: 250 | Status: AC

## 2020-07-19 MED FILL — Electrolyte-R (PH 7.4) Solution: INTRAVENOUS | Qty: 4000 | Status: AC

## 2020-07-19 MED FILL — Calcium Chloride Inj 10%: INTRAVENOUS | Qty: 10 | Status: AC

## 2020-07-19 MED FILL — Mannitol IV Soln 20%: INTRAVENOUS | Qty: 500 | Status: AC

## 2020-07-19 MED FILL — Thrombin (Recombinant) For Soln 20000 Unit: CUTANEOUS | Qty: 1 | Status: AC

## 2020-07-19 MED FILL — Heparin Sodium (Porcine) Inj 1000 Unit/ML: INTRAMUSCULAR | Qty: 30 | Status: AC

## 2020-07-19 MED FILL — Heparin Sodium (Porcine) Inj 1000 Unit/ML: INTRAMUSCULAR | Qty: 20 | Status: AC

## 2020-07-19 MED FILL — Sodium Chloride IV Soln 0.9%: INTRAVENOUS | Qty: 2000 | Status: AC

## 2020-07-19 NOTE — Progress Notes (Signed)
[facility] Evening Rounds  POD #1 s/p CABG Remains on DA, milrinone, NE. Stable hemodyn PE: BP (!) 90/47   Pulse 95   Temp 98.8 F (37.1 C) (Oral)   Resp 16   Ht 5' (1.524 m)   Wt 76.4 kg   SpO2 100%   BMI 32.89 kg/m  Alert CTA RRR   Intake/Output Summary (Last 24 hours) at 07/19/2020 1631 Last data filed at 07/19/2020 1500 Gross per 24 hour  Intake 6240.53 ml  Output 6146 ml  Net 94.53 ml    A/p: continue present management. Brenda Wilkins Z. Vickey Sages, MD [phone]

## 2020-07-19 NOTE — Hospital Course (Addendum)
History of Present Illness:    The patient is a 74 year old female who presented to the emergency department by EMS 07/17/2019 due  to a 2 to 3-day history of worsening chest pain.  The pain was exertional in nature and there was some radiation to the back and left shoulder.  The patient has not seen a medical care provider in several years.  She does reportedly have a history of hypercholesterolemia basis.  And at one time was on a statin but stopped this due to lower extremity muscular weakness.  She reportedly does have a strong family history of coronary artery disease.  In the emergency room she did rule in for non-STEMI.  Peak troponin was 321.  Her calculated LDL was 183 and she is also noted elevated triglycerides of 153.  Cardiology consultation was obtained and she was admitted for further evaluation and treatment.  She was started on aspirin and statin as well as heparin and metoprolol.  Cardiac catheterization was done on today's date and the full report is listed below.  She has significant LAD/diagonal as well as circumflex disease.  An echocardiogram has also been obtained and this shows normal left ejection fraction with estimated values at 55 to 60%.  Right ventricular function is normal and there is no noted significant valvular disease.  We are asked to see the patient in cardiothoracic surgical consultation for consideration of coronary artery surgical revascularization.   Hospital course: On the afternoon of 07/18/2020 the patient developed increased chest pain despite heparin and nitroglycerin.  He was also given additional sublingual nitroglycerin.  There were some new subtle changes on the EKG in V2 and V3 with T wave inversion as well as depression in the inferior leads.  Dr. [place] saw the patient at the bedside and a decision was made to take her back to the Cath Lab.  While in the Cath Lab she remained relatively unstable and recommendations were to proceed with emergent CABG.  She  was transferred to the operating room for the procedure.At the conclusion of the left internal mammary harvest the patient was developing some hypotension and a widening of her ECG complexes.  This progressed to profound hypotension despite a bolus of epinephrine.  TEE showed the heart was not moving much at all.  Dr. Laneta Simmers quickly placed the patient on cardiopulmonary bypass and proceed with the operation.  It is noted that the mammary artery was not a suitable bypass conduit as it was quite small.  At the conclusion of the procedure the patient was transferred to the surgical ICU in critical but stable condition.  Postoperative hospital course:  The patient has required inotropic support initially.  She was weaned from the ventilator on the morning of postoperative day #1.  PA pressures and cardiac indices are adequate and inotropes/vasopressors will be weaned over time.  She remains neurologically intact. She progressed fairly quickly in regard to weaning of vasosupportive agents and was able to be transferred to the 4 E. telemetry unit on 07/22/2020. All routine lines, monitors and drainage devices have been discontinued in standard fashion. Incisions are noted to be healing well without evidence of infection. She has an expected acute blood loss anemia and values are stabilized. Most recent hemoglobin hematocrit dated 07/24/2020 are 9.5 and 29.7 respectively. Renal function is within normal limits. She did have a mild postoperative leukocytosis but this has resolved and she is having no fevers. She remains in normal sinus rhythm. Blood pressure appears to be somewhat on  the low side and ACE inhibitor/ARB are not initiated as of yet. She continues aggressive pulmonary toilet and routine rehab modalities.

## 2020-07-19 NOTE — Progress Notes (Signed)
Progress Note  Patient Name: Brenda Wilkins Date of Encounter: 07/19/2020  [facility] Cardiologist: No primary care provider on file. New  Subjective   Intubated on vent but awake.   Inpatient Medications    Scheduled Meds: . aspirin EC  325 mg Oral Daily   Or  . aspirin  324 mg Per Tube Daily  . atorvastatin  80 mg Oral Daily  . bisacodyl  10 mg Oral Daily   Or  . bisacodyl  10 mg Rectal Daily  . chlorhexidine gluconate (MEDLINE KIT)  15 mL Mouth Rinse BID  . Chlorhexidine Gluconate Cloth  6 each Topical Daily  . docusate sodium  200 mg Oral Daily  . enoxaparin (LOVENOX) injection  40 mg Subcutaneous QHS  . insulin aspart  0-24 Units Subcutaneous Q4H  . insulin detemir  20 Units Subcutaneous Daily  . [START ON 07/20/2020] insulin detemir  20 Units Subcutaneous Daily  . mouth rinse  15 mL Mouth Rinse 10 times per day  . mupirocin ointment  1 application Nasal BID  . [START ON 07/20/2020] pantoprazole  40 mg Oral Daily  . sodium chloride flush  10-40 mL Intracatheter Q12H  . sodium chloride flush  3 mL Intravenous Q12H   Continuous Infusions: . sodium chloride 20 mL/hr at 07/18/20 2028  . sodium chloride    . sodium chloride 10 mL/hr at 07/18/20 2054  . albumin human 12.5 g (07/19/20 0512)  . cefUROXime (ZINACEF)  IV 1.5 g (07/18/20 2154)  . DOPamine 3 mcg/kg/min (07/18/20 2048)  . famotidine (PEPCID) IV 20 mg (07/18/20 2033)  . lactated ringers    . lactated ringers 20 mL/hr at 07/18/20 2052  . milrinone 0.125 mcg/kg/min (07/18/20 2050)  . nitroGLYCERIN 0 mcg/min (07/18/20 2047)  . norepinephrine    . norepinephrine (LEVOPHED) Adult infusion 3 mcg/min (07/19/20 0700)  . phenylephrine (NEO-SYNEPHRINE) Adult infusion 40 mcg/min (07/19/20 0230)  . vancomycin 1,000 mg (07/19/20 0702)   PRN Meds: sodium chloride, albumin human, morphine injection, ondansetron (ZOFRAN) IV, oxyCODONE, sodium chloride flush, sodium chloride flush, traMADol   Vital Signs     Vitals:   07/19/20 0535 07/19/20 0600 07/19/20 0700 07/19/20 0726  BP:  (!) 95/59 (!) 103/59   Pulse:      Resp: _0 Temp: 99.86 F (37.7 C) 99.68 F (37.6 C) 100.22 F (37.9 C)   TempSrc:      SpO2:    99%  Weight:      Height:        Intake/Output Summary (Last 24 hours) at 07/19/2020 0737 Last data filed at 07/19/2020 0700 Gross per 24 hour  Intake 5538.08 ml  Output 6681 ml  Net -1142.92 ml   Last 3 Weights 07/19/2020 07/18/2020 07/17/2020  Weight (lbs) 168 lb 6.9 oz 154 lb 12.8 oz 153 lb 4.8 oz  Weight (kg) 76.4 kg 70.217 kg 69.536 kg      Telemetry    NSR - Personally Reviewed  ECG    NSR with Q waves V1-2 and some persistent ST elevation in V2 - Personally Reviewed  Physical Exam   GEN: No acute distress. Intubated but awake and responsive   Neck: No JVD, IJ support lines in place Cardiac: RRR, no murmurs, rubs, or gallops.  Respiratory: Clear to auscultation anteriorly GI: Soft, nontender, non-distended  MS: No edema; No deformity. Right groin cath site without hematoma Neuro:  Nonfocal    Labs    High Sensitivity Troponin:   Recent  Labs  Lab 07/16/20 1807 07/16/20 1934 07/17/20 1015 07/17/20 1326 07/18/20 0235  TROPONINIHS 136* 227* 321* 290* 174*      Chemistry Recent Labs  Lab 07/16/20 1807 07/17/20 1015 07/18/20 1438 07/18/20 1830 07/18/20 1847 07/18/20 1850 07/18/20 2034 07/18/20 2315 07/19/20 0305 07/19/20 0358  NA 140 139   < > 139   < > 140   < > 142 144 142  K 3.9 4.0   < > 4.4   < > 4.1   < > 5.1 4.2 4.1  CL 108 107   < > 103  --  105  --   --   --  108  CO2 21* 21*  --   --   --   --   --   --   --  24  GLUCOSE 98 119*   < > 176*  --  163*  --   --   --  127*  BUN 12 15   < > 12  --  12  --   --   --  11  CREATININE 0.94 0.96   < > 0.70  --  0.70  --   --   --  0.95  CALCIUM 8.9 8.7*  --   --   --   --   --   --   --  8.1*  PROT 7.2  --   --   --   --   --   --   --   --   --   ALBUMIN 3.7  --   --   --   --    --   --   --   --   --   AST 30  --   --   --   --   --   --   --   --   --   ALT 30  --   --   --   --   --   --   --   --   --   ALKPHOS 117  --   --   --   --   --   --   --   --   --   BILITOT 0.7  --   --   --   --   --   --   --   --   --   GFRNONAA >60 >60  --   --   --   --   --   --   --  >60  ANIONGAP 11 11  --   --   --   --   --   --   --  10   < > = values in this interval not displayed.     Hematology Recent Labs  Lab 07/18/20 0235 07/18/20 1438 07/18/20 1805 07/18/20 1830 07/18/20 2036 07/18/20 2158 07/18/20 2315 07/19/20 0305 07/19/20 0358  WBC 8.5  --   --   --  23.1*  --   --   --  17.1*  RBC 4.49  --   --   --  3.98  --   --   --  3.44*  HGB 13.4   < > 8.7*   < > 11.6*   < > 11.2* 9.9* 10.3*  HCT 40.6   < > 27.1*   < > 36.8   < > 33.0* 29.0* 31.0*  MCV 90.4  --   --   --  92.5  --   --   --  90.1  MCH 29.8  --   --   --  29.1  --   --   --  29.9  MCHC 33.0  --   --   --  31.5  --   --   --  33.2  RDW 13.2  --   --   --  13.1  --   --   --  13.2  PLT 251  --  109*  --  163  --   --   --  165   < > = values in this interval not displayed.    BNPNo results for input(s): BNP, PROBNP in the last 168 hours.   DDimer No results for input(s): DDIMER in the last 168 hours.   Radiology    CARDIAC CATHETERIZATION  Result Date: 07/18/2020  1st Diag lesion is 90% stenosed.  Prox Cx lesion is 75% stenosed.  Prox LAD-1 lesion is 100% stenosed.  Prox LAD-2 lesion is 95% stenosed with 95% stenosed side branch in 1st Diag.  Post intervention, there is a 95% residual stenosis.  Post intervention, there is a 95% residual stenosis.  Post intervention, the side branch was reduced to 95% residual stenosis.  1. Acute occlusion of the proximal LAD at site of very complex bifurcation lesion. Successful restoration of antegrade flow with POBA into the first diagonal branch. Unable to cross the lesion in the LAD distal to the diagonal with a wire. Plan: emergent CABG.    CARDIAC CATHETERIZATION  Result Date: 07/18/2020 1.  Critical stenosis of the proximal LAD/first diagonal that a major bifurcation point 2.  Severe eccentric proximal left circumflex stenosis 3.  Patent right coronary artery with no significant stenosis (dominant vessel) 4.  Normal LV function with LVEF 55 to 65% Recommend: Cardiac surgical consultation for consideration of CABG.  If patient felt to be a poor candidate for CABG, would attempt bifurcation PCI of the LAD diagonal and PCI the proximal circumflex.  With normal LV function, good angiographic targets, and no major comorbidities I suspect she will be a suitable candidate for coronary bypass surgery.  DG Chest Port 1 View  Result Date: 07/19/2020 CLINICAL DATA:  Intubated.  Status post CABG. EXAM: PORTABLE CHEST 1 VIEW COMPARISON:  07/18/2020 FINDINGS: Sequelae of CABG are again identified. Endotracheal tube terminates just below the level of the clavicles, unchanged. An enteric tube terminates at the level of the GE junction, unchanged. A left chest tube and mediastinal drains remain in place. A right jugular Swan-Ganz catheter terminates over the main pulmonary artery, unchanged. Widened appearance of the mediastinum is unchanged as is asymmetric mild pleural/extrapleural density at the left lung apex. Left greater than right basilar lung opacities are unchanged. No sizable pleural effusion or pneumothorax is identified. IMPRESSION: 1. Unchanged support devices.  No pneumothorax. 2. Unchanged bibasilar atelectasis, likely atelectasis. Electronically Signed   By: Logan Bores M.D.   On: 07/19/2020 06:56   DG Chest Port 1 View  Result Date: 07/18/2020 CLINICAL DATA:  Status post coronary bypass graft. EXAM: PORTABLE CHEST 1 VIEW COMPARISON:  July 16, 2020. FINDINGS: Endotracheal tube is in good position. Distal tip of nasogastric tube is seen at the gastroesophageal junction. Right internal jugular Swan-Ganz catheter is noted with tip  in expected position of main pulmonary artery. Left-sided chest tube is noted without pneumothorax. Right lung is clear. Bony thorax is unremarkable. IMPRESSION: Left-sided chest tube is noted without pneumothorax. Distal  tip of nasogastric tube is seen at the gastroesophageal junction and advancement is recommended. Right internal jugular Swan-Ganz catheter tip is in expected position of main pulmonary artery. Electronically Signed   By: Marijo Conception M.D.   On: 07/18/2020 20:31   ECHOCARDIOGRAM COMPLETE  Result Date: 07/17/2020    ECHOCARDIOGRAM REPORT   Patient Name:   [facility] Date of Exam: 07/17/2020 Medical Rec #:  [id number]     Height:       60.0 in Accession #:    [id number]    Weight:       153.3 lb Date of Birth:  February 10, 1946      BSA:          1.667 m Patient Age:    24 years      BP:           112/66 mmHg Patient Gender: F             HR:           71 bpm. Exam Location:  Inpatient Procedure: 2D Echo, Cardiac Doppler and Color Doppler Indications:    Acute Coronary Syndrome I24.9  History:        Patient has no prior history of Echocardiogram examinations.                 Risk Factors:Dyslipidemia.  Sonographer:    Jonelle Sidle Dance Referring Phys: [id number] [place]  1. Left ventricular ejection fraction, by estimation, is 55 to 60%. The left ventricle has normal function. The left ventricle has no regional wall motion abnormalities. There is moderate asymmetric left ventricular hypertrophy. Left ventricular diastolic parameters are consistent with Grade I diastolic dysfunction (impaired relaxation).  2. Right ventricular systolic function is normal. The right ventricular size is normal.  3. The mitral valve is grossly normal. No evidence of mitral valve regurgitation.  4. The aortic valve was not well visualized. Aortic valve regurgitation is not visualized. No aortic stenosis is present. Comparison(s): No prior Echocardiogram. Conclusion(s)/Recommendation(s): Normal  biventricular function without evidence of hemodynamically significant valvular heart disease. FINDINGS  Left Ventricle: Left ventricular ejection fraction, by estimation, is 55 to 60%. The left ventricle has normal function. The left ventricle has no regional wall motion abnormalities. The left ventricular internal cavity size was small. There is moderate  asymmetric left ventricular hypertrophy. Left ventricular diastolic parameters are consistent with Grade I diastolic dysfunction (impaired relaxation). Right Ventricle: The right ventricular size is normal. No increase in right ventricular wall thickness. Right ventricular systolic function is normal. Left Atrium: Left atrial size was normal in size. Right Atrium: Right atrial size was normal in size. Pericardium: There is no evidence of pericardial effusion. Mitral Valve: The mitral valve is grossly normal. No evidence of mitral valve regurgitation. Tricuspid Valve: The tricuspid valve is grossly normal. Tricuspid valve regurgitation is not demonstrated. Aortic Valve: The aortic valve was not well visualized. Aortic valve regurgitation is not visualized. No aortic stenosis is present. Pulmonic Valve: The pulmonic valve was grossly normal. Pulmonic valve regurgitation is not visualized. Aorta: The aortic root and ascending aorta are structurally normal, with no evidence of dilitation. IAS/Shunts: The atrial septum is grossly normal.  LEFT VENTRICLE PLAX 2D LVIDd:         3.60 cm  Diastology LVIDs:         2.50 cm  LV e' medial:    5.00 cm/s LV PW:         1.50 cm  LV E/e' medial:  12.0 LV IVS:        1.00 cm  LV e' lateral:   6.42 cm/s LVOT diam:     2.00 cm  LV E/e' lateral: 9.3 LV SV:         61 LV SV Index:   37 LVOT Area:     3.14 cm  RIGHT VENTRICLE             IVC RV Basal diam:  2.30 cm     IVC diam: 1.50 cm RV S prime:     14.10 cm/s TAPSE (M-mode): 1.7 cm LEFT ATRIUM             Index       RIGHT ATRIUM           Index LA diam:        2.80 cm 1.68 cm/m   RA Area:     10.80 cm LA Vol (A2C):   33.5 ml 20.09 ml/m RA Volume:   22.50 ml  13.50 ml/m LA Vol (A4C):   23.1 ml 13.86 ml/m LA Biplane Vol: 28.1 ml 16.86 ml/m  AORTIC VALVE LVOT Vmax:   90.50 cm/s LVOT Vmean:  60.700 cm/s LVOT VTI:    0.194 m  AORTA Ao Root diam: 2.70 cm Ao Asc diam:  3.20 cm MITRAL VALVE MV Area (PHT): 2.34 cm     SHUNTS MV Decel Time: 324 msec     Systemic VTI:  0.19 m MV E velocity: 60.00 cm/s   Systemic Diam: 2.00 cm MV A velocity: 105.00 cm/s MV E/A ratio:  0.57 Rudean Haskell MD Electronically signed by Rudean Haskell MD Signature Date/Time: 07/17/2020/12:09:10 PM    Final     Cardiac Studies   Echo 07/17/20: IMPRESSIONS    1. Left ventricular ejection fraction, by estimation, is 55 to 60%. The  left ventricle has normal function. The left ventricle has no regional  wall motion abnormalities. There is moderate asymmetric left ventricular  hypertrophy. Left ventricular  diastolic parameters are consistent with Grade I diastolic dysfunction  (impaired relaxation).  2. Right ventricular systolic function is normal. The right ventricular  size is normal.  3. The mitral valve is grossly normal. No evidence of mitral valve  regurgitation.  4. The aortic valve was not well visualized. Aortic valve regurgitation  is not visualized. No aortic stenosis is present.   Comparison(s): No prior Echocardiogram.   Conclusion(s)/Recommendation(s): Normal biventricular function without  evidence of hemodynamically significant valvular heart disease.   Cardiac cath 07/19/11:  LEFT HEART CATH AND CORONARY ANGIOGRAPHY    Conclusion  1.  Critical stenosis of the proximal LAD/first diagonal that a major bifurcation point 2.  Severe eccentric proximal left circumflex stenosis 3.  Patent right coronary artery with no significant stenosis (dominant vessel) 4.  Normal LV function with LVEF 55 to 65%  Recommend: Cardiac surgical consultation for consideration  of CABG.  If patient felt to be a poor candidate for CABG, would attempt bifurcation PCI of the LAD diagonal and PCI the proximal circumflex.  With normal LV function, good angiographic targets, and no major comorbidities I suspect she will be a suitable candidate for coronary bypass surgery.  PCI 07/18/20:  Coronary/Graft Acute MI Revascularization    Conclusion    1st Diag lesion is 90% stenosed.  Prox Cx lesion is 75% stenosed.  Prox LAD-1 lesion is 100% stenosed.  Prox LAD-2 lesion is 95% stenosed with 95% stenosed side branch in 1st Diag.  Post intervention, there is a 95% residual stenosis.  Post intervention, there is a 95% residual stenosis.  Post intervention, the side branch was reduced to 95% residual stenosis.   1. Acute occlusion of the proximal LAD at site of very complex bifurcation lesion. Successful restoration of antegrade flow with POBA into the first diagonal branch. Unable to cross the lesion in the LAD distal to the diagonal with a wire.   Plan: emergent CABG.     Patient Profile     74 y.o. female 74 y.o. female with HLD and family history of CAD presents with typical angina and NSTEMI. Post cardiac cath progressed to STEMI with occlusion of the proximal LAD. Flow restored to LAD/diagonal with POBA of the proximal LAD into the diagonal. Unable to cross the mid LAD stenosis with wire. Emergent CABG on 07/18/20.  Assessment & Plan    1. NSTEMI progressing to STEMI post cardiac cath. Patient with critical bifurcation stenosis of LAD/first diagonal and 75% proximal LCx. Emergent POBA of diagonal with restoration of flow but unable to cross the LAD stenosis with a wire. Patient underwent emergent CABG by Dr Cyndia Bent. LIMA graft small with poor flow- not suitable for grafting. Had SVG to LAD and sequential SVG to diagonal and OM. Initial TEE intraop showed poor contractility of the anterior wall and septum but LV function had largely returned to normal by the end  of the case. Patient still on pressors with dopamine, neo, and milrinone. PA pressures are good and cardiac output is good. On ASA and high dose statin. Plan to wean pressors as tolerated today and wean vent.  2. Hypercholesterolemia. Possibly familial. Now on high dose statin.  3. Family history of CAD.       For questions or updates, please contact [place] Please consult [email or web address] for contact info under        Signed, Peter Martinique, MD  07/19/2020, 7:37 AM

## 2020-07-19 NOTE — Procedures (Signed)
Extubation Procedure Note  Patient Details:   Name: Brenda Wilkins DOB: 05/23/1946 MRN: [id number]   Airway Documentation:    Vent end date: 07/19/20 Vent end time: 0816   Evaluation  O2 sats: stable throughout Complications: No apparent complications Patient did tolerate procedure well. Bilateral Breath Sounds: Clear,Diminished   Yes, pt could speak post extubation.  Pt extubated to 4 l/m [place] per protocol.  Audrie Lia 07/19/2020, 8:17 AM

## 2020-07-19 NOTE — Plan of Care (Signed)
  Problem: Education: Goal: Will demonstrate proper wound care and an understanding of methods to prevent future damage Outcome: Progressing   Problem: Cardiac: Goal: Will achieve and/or maintain hemodynamic stability Outcome: Progressing   Problem: Respiratory: Goal: Respiratory status will improve Outcome: Progressing

## 2020-07-19 NOTE — Progress Notes (Signed)
1 Day Post-Op Procedure(s) (LRB): CORONARY ARTERY BYPASS GRAFTING (CABG), ON PUMP, TIMES THREE, USING ENDOSCOPICALLY HARVESTED RIGHT GREATER SAPHENOUS VEIN (N/A) TRANSESOPHAGEAL ECHOCARDIOGRAM (TEE) (N/A) Subjective: Intubated but alert and following commands  Objective: Vital signs in last 24 hours: Temp:  [96.8 F (36 C)-100.94 F (38.3 C)] 99.86 F (37.7 C) (12/14 0535) Pulse Rate:  [51-114] 95 (12/14 0431) Cardiac Rhythm: Sinus tachycardia (12/14 0100) Resp:  [7-22] 16 (12/14 0535) BP: (83-161)/(54-91) 90/59 (12/14 0431) SpO2:  [50 %-100 %] 100 % (12/14 0431) Arterial Line BP: (83-107)/(56-73) 89/58 (12/14 0535) FiO2 (%):  [50 %-100 %] 50 % (12/14 0431) Weight:  [76.4 kg] 76.4 kg (12/14 0526)  Hemodynamic parameters for last 24 hours: PAP: (27-35)/(15-22) 34/20 CVP:  [15 mmHg] 15 mmHg CO:  [3 L/min-3.7 L/min] 3.1 L/min CI:  [1.8 L/min/m2-2.2 L/min/m2] 1.8 L/min/m2  Intake/Output from previous day: 12/13 0701 - 12/14 0700 In: 5449.9 [P.O.:240; I.V.:3642.5; Blood:485; IV Piggyback:1082.4] Out: 4270 [WCBJS:2831; Blood:766; Chest Tube:250] Intake/Output this shift: Total I/O In: 3209.9 [I.V.:1742.5; Blood:485; IV Piggyback:982.4] Out: 4531 [Urine:3515; Blood:766; Chest Tube:250]  General appearance: alert and cooperative Neurologic: intact Heart: regular rate and rhythm, S1, S2 normal, no murmur Lungs: clear to auscultation bilaterally Extremities: edema mild, right groin site ok  Wound: dressing dry  Lab Results: Recent Labs    07/18/20 2036 07/18/20 2158 07/19/20 0305 07/19/20 0358  WBC 23.1*  --   --  17.1*  HGB 11.6*   < > 9.9* 10.3*  HCT 36.8   < > 29.0* 31.0*  PLT 163  --   --  165   < > = values in this interval not displayed.   BMET:  Recent Labs    07/17/20 1015 07/18/20 1438 07/18/20 1850 07/18/20 2034 07/19/20 0305 07/19/20 0358  NA 139   < > 140   < > 144 142  K 4.0   < > 4.1   < > 4.2 4.1  CL 107   < > 105  --   --  108  CO2 21*  --    --   --   --  24  GLUCOSE 119*   < > 163*  --   --  127*  BUN 15   < > 12  --   --  11  CREATININE 0.96   < > 0.70  --   --  0.95  CALCIUM 8.7*  --   --   --   --  8.1*   < > = values in this interval not displayed.    PT/INR:  Recent Labs    07/18/20 2036  LABPROT 16.1*  INR 1.3*   ABG    Component Value Date/Time   PHART 7.372 07/19/2020 0305   HCO3 28.0 07/19/2020 0305   TCO2 29 07/19/2020 0305   ACIDBASEDEF 4.0 (H) 07/18/2020 2034   O2SAT 98.0 07/19/2020 0305   CBG (last 3)  Recent Labs    07/19/20 0109 07/19/20 0207 07/19/20 0303  GLUCAP 128* 138* 128*   CXR: ok  ECG: sinus, anteroseptal infarct  Assessment/Plan: S/P Procedure(s) (LRB): CORONARY ARTERY BYPASS GRAFTING (CABG), ON PUMP, TIMES THREE, USING ENDOSCOPICALLY HARVESTED RIGHT GREATER SAPHENOUS VEIN (N/A) TRANSESOPHAGEAL ECHOCARDIOGRAM (TEE) (N/A)  POD 1 Hemodynamically stable but requiring 50 mcg neo to support BP. On milrinone 0.125 and dop 3 with adequate CI. Will add levophed for BP instead of going up on neo since she has not responded to that or volume. I suspect once she get extubated this  may not be a problem. Hold BB until off pressors.  Oxygen requirement improved and pulmonary edema resolved quickly on CXR. Will wean to extubate this am.  DC chest tubes later today after dangle.  DM: preop Hgb A1c was 5.6 on no meds. Will give some Levemir this am to get off insulin drip and start SSI.   LOS: 3 days    Brenda Wilkins 07/19/2020

## 2020-07-19 NOTE — Progress Notes (Signed)
ACT drawn prior to sheath pull was 142. Procedure was explained to patient and reinforced through out the procedure. Patient unable to learn now due to sedation. Sheath was pulled at 0114 and pressure held for to 0134. Site is a level 0 with a pressure dressing in place.

## 2020-07-19 NOTE — Discharge Instructions (Signed)
[facility] office [phone]   Endoscopic Saphenous Vein Harvesting, Care After This sheet gives you information about how to care for yourself after your procedure. Your health care provider may also give you more specific instructions. If you have problems or questions, contact your health care provider. What can I expect after the procedure? After the procedure, it is common to have:  Pain.  Bruising.  Swelling.  Numbness. Follow these instructions at home: Incision care   Follow instructions from your health care provider about how to take care of your incisions. Make sure you: ? Wash your hands with soap and water before and after you change your bandages (dressings). If soap and water are not available, use hand sanitizer. ? Change your dressings as told by your health care provider. ? Leave stitches (sutures), skin glue, or adhesive strips in place. These skin closures may need to stay in place for 2 weeks or longer. If adhesive strip edges start to loosen and curl up, you may trim the loose edges. Do not remove adhesive strips completely unless your health care provider tells you to do that.  Check your incision areas every day for signs of infection. Check for: ? More redness, swelling, or pain. ? Fluid or blood. ? Warmth. ? Pus or a bad smell. Medicines  Take over-the-counter and prescription medicines only as told by your health care provider.  Ask your health care provider if the medicine prescribed to you requires you to avoid driving or using heavy machinery. General instructions  Raise (elevate) your legs above the level of your heart while you are sitting or lying down.  Avoid crossing your legs.  Avoid sitting for long periods of time. Change positions every 30 minutes.  Do any exercises your health care providers have given you. These may include deep breathing, coughing, and walking exercises.  Do not take baths, swim, or use a hot tub until your health care  provider approves. Ask your health care provider if you may take showers. You may only be allowed to take sponge baths.  Wear compression stockings as told by your health care provider. These stockings help to prevent blood clots and reduce swelling in your legs.  Keep all follow-up visits as told by your health care provider. This is important. Contact a health care provider if:  Medicine does not help your pain.  Your pain gets worse.  You have new leg bruises or your leg bruises get bigger.  Your leg feels numb.  You have more redness, swelling, or pain around your incision.  You have fluid or blood coming from your incision.  Your incision feels warm to the touch.  You have pus or a bad smell coming from your incision.  You have a fever. Get help right away if:  Your pain is severe.  You develop pain, tenderness, warmth, redness, or swelling in any part of your leg.  You have chest pain.  You have trouble breathing. Summary  Raise (elevate) your legs above the level of your heart while you are sitting or lying down.  Wear compression stockings as told by your health care provider.  Make sure you know which symptoms should prompt you to contact your health care provider.  Keep all follow-up visits as told by your health care provider. This information is not intended to replace advice given to you by your health care provider. Make sure you discuss any questions you have with your health care provider. Document Revised: 06/30/2018 Document   Reviewed: 06/30/2018 Elsevier Patient Education  2020 [employer] Coronary Artery Bypass Grafting, Care After This sheet gives you information about how to care for yourself after your procedure. Your doctor may also give you more specific instructions. If you have problems or questions, call your doctor. What can I expect after the procedure? After the procedure, it is common to:  Feel sick to your stomach (nauseous).  Not  want to eat as much as normal (lack of appetite).  Have trouble pooping (constipation).  Have weakness and tiredness (fatigue).  Feel sad (depressed) or grouchy (irritable).  Have pain or discomfort around the cuts from surgery (incisions). Follow these instructions at home: Medicines  Take over-the-counter and prescription medicines only as told by your doctor. Do not stop taking medicines or start any new medicines unless your doctor says it is okay.  If you were prescribed an antibiotic medicine, take it as told by your doctor. Do not stop taking the antibiotic even if you start to feel better. Incision care   Follow instructions from your doctor about how to take care of your cuts from surgery. Make sure you: ? Wash your hands with soap and water before and after you change your bandage (dressing). If you cannot use soap and water, use hand sanitizer. ? Change your bandage as told by your doctor. ? Leave stitches (sutures), skin glue, or skin tape (adhesive) strips in place. They may need to stay in place for 2 weeks or longer. If tape strips get loose and curl up, you may trim the loose edges. Do not remove tape strips completely unless your doctor says it is okay.  Make sure the surgery cuts are clean, dry, and protected.  Check your cut areas every day for signs of infection. Check for: ? More redness, swelling, or pain. ? More fluid or blood. ? Warmth. ? Pus or a bad smell.  If cuts were made in your legs: ? Avoid crossing your legs. ? Avoid sitting for long periods of time. Change positions every 30 minutes. ? Raise (elevate) your legs when you are sitting. Bathing  Do not take baths, swim, or use a hot tub until your doctor says it is okay.  You may shower . Pat the surgery cuts dry. Do not rub the cuts to dry. Eating and drinking   Eat foods that are high in fiber, such as beans, nuts, whole grains, and raw fruits and vegetables. Any meats you eat should be lean  cut. Avoid canned, processed, and fried foods. This can help prevent trouble pooping. This is also a part of a heart-healthy diet.  Drink enough fluid to keep your pee (urine) pale yellow.  Do not drink alcohol until you are fully recovered. Ask your doctor when it is safe to drink alcohol. Activity  Rest and limit your activity as told by your doctor. You may be told to: ? Stop any activity right away if you have chest pain, shortness of breath, irregular heartbeats, or dizziness. Get help right away if you have any of these symptoms. ? Move around often for short periods or take short walks as told by your doctor. Slowly increase your activities. ? Avoid lifting, pushing, or pulling anything that is heavier than 10 lb (4.5 kg) for at least 6 weeks or as told by your doctor.  Do physical therapy or a cardiac rehab (cardiac rehabilitation) program as told by your doctor. ? Physical therapy involves doing exercises to maintain movement and build strength   and endurance. ? A cardiac rehab program includes:  Exercise training.  Education.  Counseling.  Do not drive until your doctor says it is okay.  Ask your doctor when you can go back to work.  Ask your doctor when you can be sexually active. General instructions  Do not drive or use heavy machinery while taking prescription pain medicine.  Do not use any products that contain nicotine or tobacco. These include cigarettes, e-cigarettes, and chewing tobacco. If you need help quitting, ask your doctor.  Take 2-3 deep breaths every few hours during the day while you get better. This helps expand your lungs and prevent problems.  If you were given a device called an incentive spirometer, use it several times a day to practice deep breathing. Support your chest with a pillow or your arms when you take deep breaths or cough.  Wear compression stockings as told by your doctor.  Weigh yourself every day. This helps to see if your body is  holding (retaining) fluid that may make your heart and lungs work harder.  Keep all follow-up visits as told by your doctor. This is important. Contact a doctor if:  You have more redness, swelling, or pain around any cut.  You have more fluid or blood coming from any cut.  Any cut feels warm to the touch.  You have pus or a bad smell coming from any cut.  You have a fever.  You have swelling in your ankles or legs.  You have pain in your legs.  You gain 2 lb (0.9 kg) or more a day.  You feel sick to your stomach or you throw up (vomit).  You have watery poop (diarrhea). Get help right away if:  You have chest pain that goes to your jaw or arms.  You are short of breath.  You have a fast or irregular heartbeat.  You notice a "clicking" in your breastbone (sternum) when you move.  You have any signs of a stroke. "BE FAST" is an easy way to remember the main warning signs: ? B - Balance. Signs are dizziness, sudden trouble walking, or loss of balance. ? E - Eyes. Signs are trouble seeing or a change in how you see. ? F - Face. Signs are sudden weakness or loss of feeling of the face, or the face or eyelid drooping on one side. ? A - Arms. Signs are weakness or loss of feeling in an arm. This happens suddenly and usually on one side of the body. ? S - Speech. Signs are sudden trouble speaking, slurred speech, or trouble understanding what people say. ? T - Time. Time to call emergency services. Write down what time symptoms started.  You have other signs of a stroke, such as: ? A sudden, very bad headache with no known cause. ? Feeling sick to your stomach. ? Throwing up. ? Jerky movements you cannot control (seizure). These symptoms may be an emergency. Do not wait to see if the symptoms will go away. Get medical help right away. Call your local emergency services (911 in the [place] Do not drive yourself to the hospital. Summary  After the procedure, it is common to  have pain or discomfort in the cuts from surgery (incisions).  Do not take baths, swim, or use a hot tub until your doctor says it is okay.  Slowly increase your activities. You may need physical therapy or cardiac rehab.  Weigh yourself every day. This helps to see if   your body is holding fluid. This information is not intended to replace advice given to you by your health care provider. Make sure you discuss any questions you have with your health care provider. Document Revised: 04/01/2018 Document Reviewed: 04/01/2018 Elsevier Patient Education  2020 Elsevier Inc[employer]  

## 2020-07-20 ENCOUNTER — Inpatient Hospital Stay (HOSPITAL_COMMUNITY): Payer: Medicare HMO

## 2020-07-20 ENCOUNTER — Encounter (HOSPITAL_COMMUNITY): Payer: Self-pay | Admitting: Surgery

## 2020-07-20 LAB — BASIC METABOLIC PANEL
Anion gap: 8 (ref 5–15)
BUN: 11 mg/dL (ref 8–23)
CO2: 25 mmol/L (ref 22–32)
Calcium: 7.7 mg/dL — ABNORMAL LOW (ref 8.9–10.3)
Chloride: 104 mmol/L (ref 98–111)
Creatinine, Ser: 0.9 mg/dL (ref 0.44–1.00)
GFR, Estimated: >60 mL/min (ref >60)
Glucose, Bld: 118 mg/dL — ABNORMAL HIGH (ref 70–99)
Potassium: 4.1 mmol/L (ref 3.5–5.1)
Sodium: 137 mmol/L (ref 135–145)

## 2020-07-20 LAB — GLUCOSE, CAPILLARY
Glucose-Capillary: 104 mg/dL — ABNORMAL HIGH (ref 70–99)
Glucose-Capillary: 107 mg/dL — ABNORMAL HIGH (ref 70–99)
Glucose-Capillary: 110 mg/dL — ABNORMAL HIGH (ref 70–99)
Glucose-Capillary: 114 mg/dL — ABNORMAL HIGH (ref 70–99)
Glucose-Capillary: 120 mg/dL — ABNORMAL HIGH (ref 70–99)
Glucose-Capillary: 124 mg/dL — ABNORMAL HIGH (ref 70–99)
Glucose-Capillary: 96 mg/dL (ref 70–99)

## 2020-07-20 LAB — CBC
HCT: 27.9 % — ABNORMAL LOW (ref 36.0–46.0)
Hemoglobin: 8.7 g/dL — ABNORMAL LOW (ref 12.0–15.0)
MCH: 29.4 pg (ref 26.0–34.0)
MCHC: 31.2 g/dL (ref 30.0–36.0)
MCV: 94.3 fL (ref 80.0–100.0)
Platelets: 100 10*3/uL — ABNORMAL LOW (ref 150–400)
RBC: 2.96 MIL/uL — ABNORMAL LOW (ref 3.87–5.11)
RDW: 13.5 % (ref 11.5–15.5)
WBC: 10.7 10*3/uL — ABNORMAL HIGH (ref 4.0–10.5)
nRBC: 0 % (ref 0.0–0.2)

## 2020-07-20 MED ORDER — FUROSEMIDE 10 MG/ML IJ SOLN
40.0000 mg | Freq: Once | INTRAMUSCULAR | Status: AC
  Administered 2020-07-20: 10:00:00 40 mg via INTRAVENOUS
  Filled 2020-07-20: qty 4

## 2020-07-20 MED ORDER — INSULIN ASPART 100 UNIT/ML ~~LOC~~ SOLN: 0.0000 [IU] | Freq: Three times a day (TID) | SUBCUTANEOUS | Status: DC

## 2020-07-20 MED ORDER — POTASSIUM CHLORIDE CRYS ER 20 MEQ PO TBCR
20.0000 meq | EXTENDED_RELEASE_TABLET | Freq: Two times a day (BID) | ORAL | Status: AC
  Administered 2020-07-20 (×2): 20 meq via ORAL
  Filled 2020-07-20 (×2): qty 1

## 2020-07-20 MED ORDER — MIDODRINE HCL 5 MG PO TABS
10.0000 mg | ORAL_TABLET | Freq: Three times a day (TID) | ORAL | Status: DC
  Administered 2020-07-20 – 2020-07-23 (×11): 10 mg via ORAL
  Filled 2020-07-20 (×12): qty 2

## 2020-07-20 MED ORDER — FE FUMARATE-B12-VIT C-FA-IFC PO CAPS
1.0000 | ORAL_CAPSULE | Freq: Two times a day (BID) | ORAL | Status: DC
  Administered 2020-07-20 – 2020-07-25 (×11): 1 via ORAL
  Filled 2020-07-20 (×11): qty 1

## 2020-07-20 NOTE — Plan of Care (Signed)

## 2020-07-20 NOTE — Progress Notes (Signed)
Progress Note  Patient Name: Brenda Wilkins Date of Encounter: 07/20/2020  [facility] Cardiologist: No primary care provider on file. New  Subjective   Feels sleepy. No pain. Breathing is OK  Inpatient Medications    Scheduled Meds: . aspirin EC  325 mg Oral Daily   Or  . aspirin  324 mg Per Tube Daily  . atorvastatin  80 mg Oral Daily  . bisacodyl  10 mg Oral Daily   Or  . bisacodyl  10 mg Rectal Daily  . Chlorhexidine Gluconate Cloth  6 each Topical Daily  . docusate sodium  200 mg Oral Daily  . ferrous fumarate-b12-vitamic C-folic acid  1 capsule Oral BID PC  . furosemide  40 mg Intravenous Once  . insulin aspart  0-24 Units Subcutaneous TID WC  . mouth rinse  15 mL Mouth Rinse BID  . midodrine  10 mg Oral TID WC  . pantoprazole  40 mg Oral Daily  . potassium chloride  20 mEq Oral BID  . sodium chloride flush  10-40 mL Intracatheter Q12H  . sodium chloride flush  3 mL Intravenous Q12H   Continuous Infusions: . sodium chloride Stopped (07/19/20 1132)  . sodium chloride    . cefUROXime (ZINACEF)  IV Stopped (07/19/20 2219)  . DOPamine 2.5 mcg/kg/min (07/20/20 0800)  . lactated ringers    . lactated ringers 20 mL/hr at 07/20/20 0800  . nitroGLYCERIN 0 mcg/min (07/18/20 2047)  . norepinephrine (LEVOPHED) Adult infusion Stopped (07/20/20 0655)   PRN Meds: sodium chloride, ondansetron (ZOFRAN) IV, oxyCODONE, sodium chloride flush, sodium chloride flush, traMADol   Vital Signs    Vitals:   07/20/20 0745 07/20/20 0800 07/20/20 0815 07/20/20 0830  BP:  (!) 99/50  (!) 96/49  Pulse: 91 95 95 93  Resp: 20 20 20 19   Temp:      TempSrc:      SpO2: 94% 91% 95% 95%  Weight:      Height:        Intake/Output Summary (Last 24 hours) at 07/20/2020 0905 Last data filed at 07/20/2020 0800 Gross per 24 hour  Intake 1184.75 ml  Output 748 ml  Net 436.75 ml   Last 3 Weights 07/20/2020 07/19/2020 07/18/2020  Weight (lbs) 165 lb 1.6 oz 168 lb 6.9 oz 154 lb 12.8 oz   Weight (kg) 74.889 kg 76.4 kg 70.217 kg      Telemetry    NSR - Personally Reviewed  ECG    NSR yesterday with Q waves V1-2 and some persistent ST elevation in V2 - Personally Reviewed  Physical Exam   GEN: No acute distress. Alert   Neck: No JVD, IJ support lines in place Cardiac: RRR, no murmurs, rubs, or gallops.  Respiratory: Clear to auscultation anteriorly GI: Soft, nontender, non-distended  MS: No edema; No deformity. Right groin cath site without hematoma Neuro:  Nonfocal    Labs    High Sensitivity Troponin:   Recent Labs  Lab 07/16/20 1807 07/16/20 1934 07/17/20 1015 07/17/20 1326 07/18/20 0235  TROPONINIHS 136* 227* 321* 290* 174*      Chemistry Recent Labs  Lab 07/16/20 1807 07/17/20 1015 07/19/20 0358 07/19/20 0707 07/19/20 0913 07/19/20 1636 07/20/20 0329  NA 140   < > 142   < > 142 139 137  K 3.9   < > 4.1   < > 4.2 4.2 4.1  CL 108   < > 108  --   --  108 104  CO2 21*   < >  24  --   --  23 25  GLUCOSE 98   < > 127*  --   --  152* 118*  BUN 12   < > 11  --   --  11 11  CREATININE 0.94   < > 0.95  --   --  0.95 0.90  CALCIUM 8.9   < > 8.1*  --   --  7.6* 7.7*  PROT 7.2  --   --   --   --   --   --   ALBUMIN 3.7  --   --   --   --   --   --   AST 30  --   --   --   --   --   --   ALT 30  --   --   --   --   --   --   ALKPHOS 117  --   --   --   --   --   --   BILITOT 0.7  --   --   --   --   --   --   GFRNONAA >60   < > >60  --   --  >60 >60  ANIONGAP 11   < > 10  --   --  8 8   < > = values in this interval not displayed.     Hematology Recent Labs  Lab 07/19/20 0358 07/19/20 0707 07/19/20 0913 07/19/20 1636 07/20/20 0329  WBC 17.1*  --   --  14.9* 10.7*  RBC 3.44*  --   --  3.28* 2.96*  HGB 10.3*   < > 9.5* 9.6* 8.7*  HCT 31.0*   < > 28.0* 30.3* 27.9*  MCV 90.1  --   --  92.4 94.3  MCH 29.9  --   --  29.3 29.4  MCHC 33.2  --   --  31.7 31.2  RDW 13.2  --   --  13.4 13.5  PLT 165  --   --  157 100*   < > = values in this  interval not displayed.    BNPNo results for input(s): BNP, PROBNP in the last 168 hours.   DDimer No results for input(s): DDIMER in the last 168 hours.   Radiology    CARDIAC CATHETERIZATION  Result Date: 07/18/2020  1st Diag lesion is 90% stenosed.  Prox Cx lesion is 75% stenosed.  Prox LAD-1 lesion is 100% stenosed.  Prox LAD-2 lesion is 95% stenosed with 95% stenosed side branch in 1st Diag.  Post intervention, there is a 95% residual stenosis.  Post intervention, there is a 95% residual stenosis.  Post intervention, the side branch was reduced to 95% residual stenosis.  1. Acute occlusion of the proximal LAD at site of very complex bifurcation lesion. Successful restoration of antegrade flow with POBA into the first diagonal branch. Unable to cross the lesion in the LAD distal to the diagonal with a wire. Plan: emergent CABG.   CARDIAC CATHETERIZATION  Result Date: 07/18/2020 1.  Critical stenosis of the proximal LAD/first diagonal that a major bifurcation point 2.  Severe eccentric proximal left circumflex stenosis 3.  Patent right coronary artery with no significant stenosis (dominant vessel) 4.  Normal LV function with LVEF 55 to 65% Recommend: Cardiac surgical consultation for consideration of CABG.  If patient felt to be a poor candidate for CABG, would attempt bifurcation PCI of the LAD diagonal and  PCI the proximal circumflex.  With normal LV function, good angiographic targets, and no major comorbidities I suspect she will be a suitable candidate for coronary bypass surgery.  DG Chest Port 1 View  Result Date: 07/20/2020 CLINICAL DATA:  Status post coronary bypass grafting EXAM: PORTABLE CHEST 1 VIEW COMPARISON:  07/19/2020 FINDINGS: Endotracheal tube and gastric catheter have been removed in the interval. Swan-Ganz catheter has also been removed. Right jugular sheath remains in place left-sided thoracostomy catheter, mediastinal drain and pericardial drain have all been  removed. No recurrent pneumothorax is noted. Minimal left basilar atelectasis is noted similar to that seen on the prior study. IMPRESSION: Left basilar atelectasis. Tubes and lines removed in the interval. Electronically Signed   By: Alcide CleverMark  Lukens M.D.   On: 07/20/2020 08:02   DG Chest Port 1 View  Result Date: 07/19/2020 CLINICAL DATA:  Intubated.  Status post CABG. EXAM: PORTABLE CHEST 1 VIEW COMPARISON:  07/18/2020 FINDINGS: Sequelae of CABG are again identified. Endotracheal tube terminates just below the level of the clavicles, unchanged. An enteric tube terminates at the level of the GE junction, unchanged. A left chest tube and mediastinal drains remain in place. A right jugular Swan-Ganz catheter terminates over the main pulmonary artery, unchanged. Widened appearance of the mediastinum is unchanged as is asymmetric mild pleural/extrapleural density at the left lung apex. Left greater than right basilar lung opacities are unchanged. No sizable pleural effusion or pneumothorax is identified. IMPRESSION: 1. Unchanged support devices.  No pneumothorax. 2. Unchanged bibasilar atelectasis, likely atelectasis. Electronically Signed   By: Sebastian AcheAllen  Grady M.D.   On: 07/19/2020 06:56   DG Chest Port 1 View  Result Date: 07/18/2020 CLINICAL DATA:  Status post coronary bypass graft. EXAM: PORTABLE CHEST 1 VIEW COMPARISON:  July 16, 2020. FINDINGS: Endotracheal tube is in good position. Distal tip of nasogastric tube is seen at the gastroesophageal junction. Right internal jugular Swan-Ganz catheter is noted with tip in expected position of main pulmonary artery. Left-sided chest tube is noted without pneumothorax. Right lung is clear. Bony thorax is unremarkable. IMPRESSION: Left-sided chest tube is noted without pneumothorax. Distal tip of nasogastric tube is seen at the gastroesophageal junction and advancement is recommended. Right internal jugular Swan-Ganz catheter tip is in expected position of main  pulmonary artery. Electronically Signed   By: Lupita RaiderJames  Green Jr M.D.   On: 07/18/2020 20:31    Cardiac Studies   Echo 07/17/20: IMPRESSIONS    1. Left ventricular ejection fraction, by estimation, is 55 to 60%. The  left ventricle has normal function. The left ventricle has no regional  wall motion abnormalities. There is moderate asymmetric left ventricular  hypertrophy. Left ventricular  diastolic parameters are consistent with Grade I diastolic dysfunction  (impaired relaxation).  2. Right ventricular systolic function is normal. The right ventricular  size is normal.  3. The mitral valve is grossly normal. No evidence of mitral valve  regurgitation.  4. The aortic valve was not well visualized. Aortic valve regurgitation  is not visualized. No aortic stenosis is present.   Comparison(s): No prior Echocardiogram.   Conclusion(s)/Recommendation(s): Normal biventricular function without  evidence of hemodynamically significant valvular heart disease.   Cardiac cath 07/19/11:  LEFT HEART CATH AND CORONARY ANGIOGRAPHY    Conclusion  1.  Critical stenosis of the proximal LAD/first diagonal that a major bifurcation point 2.  Severe eccentric proximal left circumflex stenosis 3.  Patent right coronary artery with no significant stenosis (dominant vessel) 4.  Normal LV function  with LVEF 55 to 65%  Recommend: Cardiac surgical consultation for consideration of CABG.  If patient felt to be a poor candidate for CABG, would attempt bifurcation PCI of the LAD diagonal and PCI the proximal circumflex.  With normal LV function, good angiographic targets, and no major comorbidities I suspect she will be a suitable candidate for coronary bypass surgery.  PCI 07/18/20:  Coronary/Graft Acute MI Revascularization    Conclusion    1st Diag lesion is 90% stenosed.  Prox Cx lesion is 75% stenosed.  Prox LAD-1 lesion is 100% stenosed.  Prox LAD-2 lesion is 95% stenosed with 95%  stenosed side branch in 1st Diag.  Post intervention, there is a 95% residual stenosis.  Post intervention, there is a 95% residual stenosis.  Post intervention, the side branch was reduced to 95% residual stenosis.   1. Acute occlusion of the proximal LAD at site of very complex bifurcation lesion. Successful restoration of antegrade flow with POBA into the first diagonal branch. Unable to cross the lesion in the LAD distal to the diagonal with a wire.   Plan: emergent CABG.     Patient Profile     74 y.o. female 74 y.o. female with HLD and family history of CAD presents with typical angina and NSTEMI. Post cardiac cath progressed to STEMI with occlusion of the proximal LAD. Flow restored to LAD/diagonal with POBA of the proximal LAD into the diagonal. Unable to cross the mid LAD stenosis with wire. Emergent CABG on 07/18/20.  Assessment & Plan    1. NSTEMI progressing to STEMI post cardiac cath. Patient with critical bifurcation stenosis of LAD/first diagonal and 75% proximal LCx. Emergent POBA of diagonal with restoration of flow but unable to cross the LAD stenosis with a wire. Patient underwent emergent CABG by Dr Laneta Simmers. LIMA graft small with poor flow- not suitable for grafting. Had SVG to LAD and sequential SVG to diagonal and OM. Initial TEE intraop showed poor contractility of the anterior wall and septum but LV function had largely returned to normal by the end of the case. Patient extubated yesterday am. Patient still on pressors with dopamine. BP soft. PA pressures are good and cardiac output is good. On ASA and high dose statin. Plan to wean pressors as tolerated today. Volume overloaded with increase weight. Diuresis as tolerated.   2. Hypercholesterolemia. Possibly familial. Now on high dose statin.  3. Family history of CAD.       For questions or updates, please contact [facility] Please consult [email or web address] for contact info under        Signed, Cody Albus [place],  MD  07/20/2020, 9:05 AM

## 2020-07-20 NOTE — Progress Notes (Signed)
[facility] BRIEF SICU PROGRESS NOTE  2 Days Post-Op  S/P Procedure(s) (LRB): CORONARY ARTERY BYPASS GRAFTING (CABG), ON PUMP, TIMES THREE, USING ENDOSCOPICALLY HARVESTED RIGHT GREATER SAPHENOUS VEIN (N/A) TRANSESOPHAGEAL ECHOCARDIOGRAM (TEE) (N/A)   Stable day NSR w/ stable BP Breathing comfortably on nasal cannula Diuresing well  Plan: Continue current plan  Purcell Nails, MD 07/20/2020 5:24 PM

## 2020-07-20 NOTE — Progress Notes (Addendum)
2 Days Post-Op Procedure(s) (LRB): CORONARY ARTERY BYPASS GRAFTING (CABG), ON PUMP, TIMES THREE, USING ENDOSCOPICALLY HARVESTED RIGHT GREATER SAPHENOUS VEIN (N/A) TRANSESOPHAGEAL ECHOCARDIOGRAM (TEE) (N/A) Subjective:  Only complaint is being sleepy. She ambulated 2 squares this am.  Objective: Vital signs in last 24 hours: Temp:  [98.8 F (37.1 C)-100.22 F (37.9 C)] 99 F (37.2 C) (12/15 0400) Pulse Rate:  [91-104] 93 (12/15 0545) Cardiac Rhythm: Atrial paced (12/15 0400) Resp:  [16-35] 20 (12/15 0545) BP: (81-106)/(39-68) 100/54 (12/15 0530) SpO2:  [90 %-100 %] 94 % (12/15 0545) Arterial Line BP: (49-122)/(43-74) 49/46 (12/14 2230) Weight:  [74.9 kg] 74.9 kg (12/15 0500)  Hemodynamic parameters for last 24 hours: PAP: (26-44)/(12-31) 33/17  Intake/Output from previous day: 12/14 0701 - 12/15 0700 In: 1498.6 [I.V.:1042.1; IV Piggyback:456.5] Out: 903 [Urine:753; Chest Tube:150] Intake/Output this shift: Total I/O In: -  Out: 25 [Urine:25]  General appearance: alert and cooperative Neurologic: intact Heart: regular rate and rhythm, S1, S2 normal, no murmur Lungs: clear to auscultation bilaterally Extremities: edema moderate Wound: dressing dry  Lab Results: Recent Labs    07/19/20 1636 07/20/20 0329  WBC 14.9* 10.7*  HGB 9.6* 8.7*  HCT 30.3* 27.9*  PLT 157 100*   BMET:  Recent Labs    07/19/20 1636 07/20/20 0329  NA 139 137  K 4.2 4.1  CL 108 104  CO2 23 25  GLUCOSE 152* 118*  BUN 11 11  CREATININE 0.95 0.90  CALCIUM 7.6* 7.7*    PT/INR:  Recent Labs    07/18/20 2036  LABPROT 16.1*  INR 1.3*   ABG    Component Value Date/Time   PHART 7.353 07/19/2020 0913   HCO3 24.7 07/19/2020 0913   TCO2 26 07/19/2020 0913   ACIDBASEDEF 1.0 07/19/2020 0913   O2SAT 96.0 07/19/2020 0913   CBG (last 3)  Recent Labs    07/20/20 0002 07/20/20 0438 07/20/20 0627  GLUCAP 124* 114* 110*   CXR: clear  Assessment/Plan: S/P Procedure(s)  (LRB): CORONARY ARTERY BYPASS GRAFTING (CABG), ON PUMP, TIMES THREE, USING ENDOSCOPICALLY HARVESTED RIGHT GREATER SAPHENOUS VEIN (N/A) TRANSESOPHAGEAL ECHOCARDIOGRAM (TEE) (N/A)  POD 2 Hemodynamically stable on dop 2.5. SBP 90's. Will wean dop as tolerated. Hold off on beta blocker. Start midodrine to support BP for a while.   Volume excess: Wt 10 lbs over preop and edematous. Start diuresis as tolerated.  DM: no hx and Hgb A1c normal, will stop Levemir and continue SSI today.  Ambulate, IS.   Plts dropped to 100 so will stop lovenox.   Expected acute postop blood loss anemia: Start iron.  Plan to send home on ASA and Plavix.  Discussed status and plans with son at bedside.   LOS: 4 days    Alleen Borne 07/20/2020

## 2020-07-20 NOTE — Plan of Care (Signed)
  Problem: Skin Integrity: Goal: Wound healing without signs and symptoms of infection Outcome: Progressing   Problem: Cardiovascular: Goal: Ability to achieve and maintain adequate cardiovascular perfusion will improve Outcome: Progressing Goal: Vascular access site(s) Level 0-1 will be maintained Outcome: Progressing   Problem: Education: Goal: Understanding of CV disease, CV risk reduction, and recovery process will improve Outcome: Progressing  Problem: Activity: Goal: Risk for activity intolerance will decrease Outcome: Progressing   Problem: Clinical Measurements: Goal: Ability to maintain clinical measurements within normal limits will improve Outcome: Progressing Goal: Will remain free from infection Outcome: Progressing Goal: Respiratory complications will improve Outcome: Progressing   Problem: Health Behavior/Discharge Planning: Goal: Ability to manage health-related needs will improve Outcome: Progressing

## 2020-07-21 LAB — GLUCOSE, CAPILLARY
Glucose-Capillary: 93 mg/dL (ref 70–99)
Glucose-Capillary: 100 mg/dL — ABNORMAL HIGH (ref 70–99)
Glucose-Capillary: 95 mg/dL (ref 70–99)
Glucose-Capillary: 89 mg/dL (ref 70–99)

## 2020-07-21 LAB — BASIC METABOLIC PANEL
Anion gap: 8 (ref 5–15)
BUN: 14 mg/dL (ref 8–23)
CO2: 25 mmol/L (ref 22–32)
Calcium: 8 mg/dL — ABNORMAL LOW (ref 8.9–10.3)
Chloride: 106 mmol/L (ref 98–111)
Creatinine, Ser: 1.02 mg/dL — ABNORMAL HIGH (ref 0.44–1.00)
GFR, Estimated: 58 mL/min — ABNORMAL LOW (ref >60)
Glucose, Bld: 104 mg/dL — ABNORMAL HIGH (ref 70–99)
Potassium: 4.5 mmol/L (ref 3.5–5.1)
Sodium: 139 mmol/L (ref 135–145)

## 2020-07-21 LAB — CBC
HCT: 29.5 % — ABNORMAL LOW (ref 36.0–46.0)
Hemoglobin: 9.1 g/dL — ABNORMAL LOW (ref 12.0–15.0)
MCH: 29.1 pg (ref 26.0–34.0)
MCHC: 30.8 g/dL (ref 30.0–36.0)
MCV: 94.2 fL (ref 80.0–100.0)
Platelets: 150 10*3/uL (ref 150–400)
RBC: 3.13 MIL/uL — ABNORMAL LOW (ref 3.87–5.11)
RDW: 13.5 % (ref 11.5–15.5)
WBC: 12 10*3/uL — ABNORMAL HIGH (ref 4.0–10.5)
nRBC: 0 % (ref 0.0–0.2)

## 2020-07-21 LAB — ECHO INTRAOPERATIVE TEE
Height: 60 in
S' Lateral: 2.45 cm
Weight: 2476.8 oz

## 2020-07-21 NOTE — Progress Notes (Signed)
CT surgery p.m. rounds  Patient feeling well sitting up in chair Walked in hallway earlier Sinus rhythm Stable day Blood pressure (!) 106/51, pulse 88, temperature 97.7 F (36.5 C), resp. rate (!) 23, height 5' (1.524 m), weight 73.1 kg, SpO2 95 %.

## 2020-07-21 NOTE — Plan of Care (Signed)
  Problem: Education: Goal: Knowledge of General Education information will improve Description: Including pain rating scale, medication(s)/side effects and non-pharmacologic comfort measures Outcome: Progressing   Problem: Health Behavior/Discharge Planning: Goal: Ability to manage health-related needs will improve Outcome: Progressing   Problem: Clinical Measurements: Goal: Ability to maintain clinical measurements within normal limits will improve Outcome: Progressing Goal: Will remain free from infection Outcome: Progressing Goal: Diagnostic test results will improve Outcome: Progressing Goal: Respiratory complications will improve Outcome: Progressing Goal: Cardiovascular complication will be avoided Outcome: Progressing   Problem: Activity: Goal: Risk for activity intolerance will decrease Outcome: Progressing   Problem: Nutrition: Goal: Adequate nutrition will be maintained Outcome: Progressing   Problem: Coping: Goal: Level of anxiety will decrease Outcome: Progressing   Problem: Elimination: Goal: Will not experience complications related to bowel motility Outcome: Progressing Goal: Will not experience complications related to urinary retention Outcome: Progressing   Problem: Pain Managment: Goal: General experience of comfort will improve Outcome: Progressing   Problem: Safety: Goal: Ability to remain free from injury will improve Outcome: Progressing   Problem: Skin Integrity: Goal: Risk for impaired skin integrity will decrease Outcome: Progressing   Problem: Education: Goal: Understanding of CV disease, CV risk reduction, and recovery process will improve Outcome: Progressing Goal: Individualized Educational Video(s) Outcome: Progressing   Problem: Activity: Goal: Ability to return to baseline activity level will improve Outcome: Progressing   Problem: Cardiovascular: Goal: Ability to achieve and maintain adequate cardiovascular perfusion  will improve Outcome: Progressing Goal: Vascular access site(s) Level 0-1 will be maintained Outcome: Progressing   Problem: Health Behavior/Discharge Planning: Goal: Ability to safely manage health-related needs after discharge will improve Outcome: Progressing   Problem: Education: Goal: Will demonstrate proper wound care and an understanding of methods to prevent future damage Outcome: Progressing Goal: Knowledge of disease or condition will improve Outcome: Progressing Goal: Knowledge of the prescribed therapeutic regimen will improve Outcome: Progressing Goal: Individualized Educational Video(s) Outcome: Progressing   Problem: Activity: Goal: Risk for activity intolerance will decrease Outcome: Progressing   Problem: Cardiac: Goal: Will achieve and/or maintain hemodynamic stability Outcome: Progressing   Problem: Clinical Measurements: Goal: Postoperative complications will be avoided or minimized Outcome: Progressing   Problem: Respiratory: Goal: Respiratory status will improve Outcome: Progressing   Problem: Skin Integrity: Goal: Wound healing without signs and symptoms of infection Outcome: Progressing Goal: Risk for impaired skin integrity will decrease Outcome: Progressing   Problem: Urinary Elimination: Goal: Ability to achieve and maintain adequate renal perfusion and functioning will improve Outcome: Progressing   

## 2020-07-21 NOTE — Progress Notes (Signed)
3 Days Post-Op Procedure(s) (LRB): CORONARY ARTERY BYPASS GRAFTING (CABG), ON PUMP, TIMES THREE, USING ENDOSCOPICALLY HARVESTED RIGHT GREATER SAPHENOUS VEIN (N/A) TRANSESOPHAGEAL ECHOCARDIOGRAM (TEE) (N/A) Subjective:  No complaints. Ambulated this am.  Objective: Vital signs in last 24 hours: Temp:  [97.4 F (36.3 C)-98.6 F (37 C)] 98.6 F (37 C) (12/16 0400) Pulse Rate:  [83-108] 96 (12/16 0700) Cardiac Rhythm: Normal sinus rhythm (12/16 0400) Resp:  [15-25] 21 (12/16 0700) BP: (89-114)/(45-64) 108/59 (12/16 0700) SpO2:  [90 %-100 %] 93 % (12/16 0700) Weight:  [73.1 kg] 73.1 kg (12/16 0615)  Hemodynamic parameters for last 24 hours:    Intake/Output from previous day: 12/15 0701 - 12/16 0700 In: 375.7 [I.V.:274.8; IV Piggyback:100.8] Out: 2550 [Urine:2550] Intake/Output this shift: No intake/output data recorded.  General appearance: alert and cooperative Neurologic: intact Heart: regular rate and rhythm, S1, S2 normal, no murmur Lungs: clear to auscultation bilaterally Extremities: edema mild Wound: dressings dry  Lab Results: Recent Labs    07/20/20 0329 07/21/20 0510  WBC 10.7* 12.0*  HGB 8.7* 9.1*  HCT 27.9* 29.5*  PLT 100* 150   BMET:  Recent Labs    07/20/20 0329 07/21/20 0510  NA 137 139  K 4.1 4.5  CL 104 106  CO2 25 25  GLUCOSE 118* 104*  BUN 11 14  CREATININE 0.90 1.02*  CALCIUM 7.7* 8.0*    PT/INR:  Recent Labs    07/18/20 2036  LABPROT 16.1*  INR 1.3*   ABG    Component Value Date/Time   PHART 7.353 07/19/2020 0913   HCO3 24.7 07/19/2020 0913   TCO2 26 07/19/2020 0913   ACIDBASEDEF 1.0 07/19/2020 0913   O2SAT 96.0 07/19/2020 0913   CBG (last 3)  Recent Labs    07/20/20 1543 07/20/20 1942 07/21/20 0652  GLUCAP 107* 104* 93    Assessment/Plan: S/P Procedure(s) (LRB): CORONARY ARTERY BYPASS GRAFTING (CABG), ON PUMP, TIMES THREE, USING ENDOSCOPICALLY HARVESTED RIGHT GREATER SAPHENOUS VEIN (N/A) TRANSESOPHAGEAL  ECHOCARDIOGRAM (TEE) (N/A)  POD 3 Hemodynamically stable with low normal BP on dop 2.5 and NE restarted overnight. On midodrine 10 tid. Will dc dopamine and wean levophed for SBP>95. Continue midodrine. No beta blocker at this time.  Volume excess: diuresed -2174 cc yesterday and wt down. Wt still 6 lbs over preop but will wait on further diuresis until off pressors.  Glucose under good control. DC CBG's and SSI.   DC foley.  Continue ambulation and IS.   LOS: 5 days    Alleen Borne 07/21/2020

## 2020-07-21 NOTE — Plan of Care (Signed)

## 2020-07-21 NOTE — Discharge Summary (Signed)
Physician Discharge Summary  Patient ID: Brenda Wilkins MRN: [id number] DOB/AGE: 07-Feb-1946 74 y.o.  Admit date: 07/16/2020 Discharge date: 07/25/2020  Admission Diagnoses:  Patient Active Problem List   Diagnosis Date Noted  . NSTEMI (non-ST elevated myocardial infarction) (HCC) 07/16/2020  . Hyperlipidemia 07/16/2020    Discharge Diagnoses:  Principal Problem:   NSTEMI (non-ST elevated myocardial infarction) [name]) Active Problems:   Hyperlipidemia   S/P CABG x 3  Patient Active Problem List   Diagnosis Date Noted  . 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 NSTEMI (non-ST elevated myocardial infarction) (HCC) 07/16/2020  . Hyperlipidemia 07/16/2020    History of Present Illness:    The patient is a 74 year old female who presented to the emergency department by EMS 07/17/2019 due  to a 2 to 3-day history of worsening chest pain.  The pain was exertional in nature and there was some radiation to the back and left shoulder.  The patient has not seen a medical care provider in several years.  She does reportedly have a history of hypercholesterolemia basis.  And at one time was on a statin but stopped this due to lower extremity muscular weakness.  She reportedly does have a strong family history of coronary artery disease.  In the emergency room she did rule in for non-STEMI.  Peak troponin was 321.  Her calculated LDL was 183 and she is also noted elevated triglycerides of 153.  Cardiology consultation was obtained and she was admitted for further evaluation and treatment.  She was started on aspirin and statin as well as heparin and metoprolol.  Cardiac catheterization was done on today's date and the full report is listed below.  She has significant LAD/diagonal as well as circumflex disease.  An echocardiogram has also been obtained and this shows normal left ejection fraction with estimated values at 55 to 60%.  Right ventricular function is normal and there is no noted significant valvular disease.   We are asked to see the patient in cardiothoracic surgical consultation for consideration of coronary artery surgical revascularization.   Hospital Course: On the afternoon of 07/18/2020 the patient developed increased chest pain despite heparin and nitroglycerin.  He was also given additional sublingual nitroglycerin.  There were some new subtle changes on the EKG in V2 and V3 with T wave inversion as well as depression in the inferior leads.  Dr. Swaziland saw the patient at the bedside and a decision was made to take her back to the Cath Lab.  While in the Cath Lab she remained relatively unstable and recommendations were to proceed with emergent CABG.  She was transferred to the operating room for the procedure.At the conclusion of the left internal mammary harvest the patient was developing some hypotension and a widening of her ECG complexes.  This progressed to profound hypotension despite a bolus of epinephrine.  TEE showed the heart was not moving much at all.  Dr. Laneta Simmers quickly placed the patient on cardiopulmonary bypass and proceed with the operation.  It is noted that the mammary artery was not a suitable bypass conduit as it was quite small.  At the conclusion of the procedure the patient was transferred to the surgical ICU in critical but stable condition.  Postoperative Hospital Course:  The patient has required inotropic support initially.  She was weaned from the ventilator on the morning of postoperative day #1.  PA pressures and cardiac indices are adequate and inotropes/vasopressors will be weaned over time.  She remains neurologically intact.  On postoperative day #2 she remained hemodynamically stable on low-dose dopamine with systolic blood pressure in the 90s.  We have held off on starting beta-blocker.  She was started on midodrine to support blood pressure.  She was noted to be volume overloaded with her weight approximately 10 pounds over her preoperative level and noted edema.   She was started on a course of diuretics.  Norepinephrine was needed to be restarted overnight due to persistent low blood pressure with plans to wean with systolic greater than 95.  She has responded well to diuretics thus far but are now on hold until off of pressor support.  She is tolerating gradually increasing activities using standard cardiac rehab protocols. She required Midodrine for a few days to support her blood pressure but this was stopped prior to discharge. Pacer wires were removed without complication. She regained independent mobility with no problems by the time of discharge.  Her incision healed with no sign of complication. She complained of toe pain on the morning of discharge that was similar to previous episodes of gout. She was given a prescription for colchicine 0.6mg  po BID for 3 days if needed for gout flare. Today, she is determined stable for discharge.    Discharged Condition: stable    Consults: None  Significant Diagnostic Studies:   CLINICAL DATA:  History of CABG  EXAM: CHEST - 2 VIEW  COMPARISON:  07/20/2020 chest radiograph.  FINDINGS: Intact sternotomy wires. Stable cardiomediastinal silhouette with mild cardiomegaly. No pneumothorax. Small bilateral pleural effusions, stable. No pulmonary edema. Mild bibasilar atelectasis, similar.  IMPRESSION: 1. Stable mild cardiomegaly without pulmonary edema. 2. Stable small bilateral pleural effusions and mild bibasilar atelectasis.   Electronically Signed   By: Delbert Phenix M.D.   On: 07/23/2020 10:04  Treatments:  CARDIOVASCULAR SURGERY OPERATIVE NOTE  07/18/2020  Surgeon:  Alleen Borne, MD  First Assistant: Gershon Crane,  PA-C   Preoperative Diagnosis:  Severe two-vessel coronary artery disease with acute anterior STEMI due to LAD occlusion.   Postoperative Diagnosis:  Same   Procedure:  1. Emergent Median Sternotomy 2. Extracorporeal circulation 3.   Coronary  artery bypass grafting x 3   Saphenous vein graft to the LAD  Sequential SVG to diagonal and OM.   4.   Endoscopic vein harvest from the right leg   Anesthesia:  General Endotracheal  Discharge Exam: Blood pressure 117/61, pulse 92, temperature 98.7 F (37.1 C), temperature source Oral, resp. rate 18, height 5' (1.524 m), weight 69.9 kg, SpO2 95 %.    General appearance: alert, cooperative and no distress Heart: regular rate and rhythm, S1, S2 normal, no murmur, click, rub or gallop Lungs: clear to auscultation bilaterally Abdomen: soft, non-tender; bowel sounds normal; no masses,  no organomegaly Extremities: extremities normal, atraumatic, no cyanosis or edema Wound: clean and dry  Disposition: Discharge disposition: 01-Home or Self Care       Discharge Instructions    Amb Referral to Cardiac Rehabilitation   Complete by: As directed    Diagnosis:  CABG Valve Replacement     Valve: Pulmonic   CABG X ___: 3 Comment - 07/18/2020   After initial evaluation and assessments completed: Virtual Based Care may be provided alone or in conjunction with Phase 2 Cardiac Rehab based on patient barriers.: Yes     Allergies as of 07/25/2020   No Known Allergies     Medication List    TAKE these medications   acetaminophen 325 MG tablet Commonly  known as: TYLENOL Take 325-650 mg by mouth every 6 (six) hours as needed for mild pain (or headaches).   aspirin 325 MG EC tablet Take 1 tablet (325 mg total) by mouth daily. What changed:   how much to take  when to take this  reasons to take this   atorvastatin 80 MG tablet Commonly known as: LIPITOR Take 1 tablet (80 mg total) by mouth daily.   ferrous fumarate-b12-vitamic C-folic acid capsule Commonly known as: TRINSICON / FOLTRIN Take 1 capsule by mouth 2 (two) times daily after a meal.   metoprolol tartrate 25 MG tablet Commonly known as: LOPRESSOR Take 0.5 tablets (12.5 mg total) by mouth 2 (two) times  daily.   Mitigare 0.6 MG Caps Generic drug: Colchicine Take 0.6 mg by mouth 2 (two) times daily as needed for up to 3 days (For symptoms of gout).   traMADol 50 MG tablet Commonly known as: ULTRAM Take 1 tablet (50 mg total) by mouth every 6 (six) hours as needed for up to 7 days for moderate pain. Notes to patient: As needed       Follow-up Information    Swaziland, Peter M, MD Follow up.   Specialty: Cardiology Why: Please see discharge paperwork for follow-up appointment with cardiology Contact information: [address] [place] [address] [phone]        Alleen Borne, MD Follow up.   Specialty: Cardiothoracic Surgery Why: Please see discharge paperwork for follow-up appointment for suture removal as well as appointment with surgeon.  On the date you see the surgeon please obtain a chest x-ray at [facility] 1/2-hour prior to appointment (in same office complex). Contact information: [address] [employer] [address] [place] [address] [phone]              The patient has been discharged on:   1.Beta Blocker:  Yes Mahler.Beck   ]                              No   [   ]                              If No, reason:  2.Ace Inhibitor/ARB: Yes [   ]                                     No  [ no   ]                                     If No, reason: on midodrine  3.Statin:   Yes [ yes  ]                  No  [   ]                  If No, reason:  4.Ecasa:  Yes  Mahler.Beck   ]                  No   [   ]                  If No, reason:  Signed: Sharlene Dory 07/25/2020,  1:00 PM

## 2020-07-22 LAB — BPAM RBC
Blood Product Expiration Date: 202201122359
Blood Product Expiration Date: 202201122359
Blood Product Expiration Date: 202201132359
Blood Product Expiration Date: 202201132359
Blood Product Expiration Date: 202201132359
Blood Product Expiration Date: 202201132359
ISSUE DATE / TIME: 202112131631
ISSUE DATE / TIME: 202112131631
ISSUE DATE / TIME: 202112131631
ISSUE DATE / TIME: 202112131631
Unit Type and Rh: 7300
Unit Type and Rh: 7300
Unit Type and Rh: 7300
Unit Type and Rh: 7300
Unit Type and Rh: 7300
Unit Type and Rh: 7300

## 2020-07-22 LAB — TYPE AND SCREEN
ABO/RH(D): B POS
Antibody Screen: NEGATIVE
Unit division: 0
Unit division: 0
Unit division: 0
Unit division: 0
Unit division: 0
Unit division: 0

## 2020-07-22 MED ORDER — TRAMADOL HCL 50 MG PO TABS
50.0000 mg | ORAL_TABLET | ORAL | Status: DC | PRN
  Administered 2020-07-25: 50 mg via ORAL
  Filled 2020-07-22: qty 1

## 2020-07-22 MED ORDER — SODIUM CHLORIDE 0.9 % IV SOLN: 250.0000 mL | INTRAVENOUS | Status: DC | PRN

## 2020-07-22 MED ORDER — CHLORHEXIDINE GLUCONATE CLOTH 2 % EX PADS
6.0000 | MEDICATED_PAD | Freq: Every day | CUTANEOUS | Status: DC
  Administered 2020-07-22 – 2020-07-25 (×3): 6 via TOPICAL

## 2020-07-22 MED ORDER — ~~LOC~~ CARDIAC SURGERY, PATIENT & FAMILY EDUCATION: Freq: Once | Status: AC

## 2020-07-22 MED ORDER — ACETAMINOPHEN 325 MG PO TABS: 650.0000 mg | ORAL_TABLET | Freq: Four times a day (QID) | ORAL | Status: DC | PRN

## 2020-07-22 MED ORDER — POTASSIUM CHLORIDE CRYS ER 20 MEQ PO TBCR
20.0000 meq | EXTENDED_RELEASE_TABLET | Freq: Every day | ORAL | Status: DC
  Administered 2020-07-22 – 2020-07-23 (×2): 20 meq via ORAL
  Filled 2020-07-22 (×2): qty 1

## 2020-07-22 MED ORDER — SODIUM CHLORIDE 0.9% FLUSH: 3.0000 mL | INTRAVENOUS | Status: DC | PRN

## 2020-07-22 MED ORDER — ONDANSETRON HCL 4 MG PO TABS: 4.0000 mg | ORAL_TABLET | Freq: Four times a day (QID) | ORAL | Status: DC | PRN

## 2020-07-22 MED ORDER — SODIUM CHLORIDE 0.9% FLUSH
3.0000 mL | Freq: Two times a day (BID) | INTRAVENOUS | Status: DC
  Administered 2020-07-23 (×2): 3 mL via INTRAVENOUS

## 2020-07-22 MED ORDER — SENNOSIDES-DOCUSATE SODIUM 8.6-50 MG PO TABS: 1.0000 | ORAL_TABLET | Freq: Two times a day (BID) | ORAL | Status: DC | PRN

## 2020-07-22 MED ORDER — ONDANSETRON HCL 4 MG/2ML IJ SOLN: 4.0000 mg | Freq: Four times a day (QID) | INTRAMUSCULAR | Status: DC | PRN

## 2020-07-22 MED ORDER — FUROSEMIDE 40 MG PO TABS
40.0000 mg | ORAL_TABLET | Freq: Every day | ORAL | Status: DC
  Administered 2020-07-22 – 2020-07-23 (×2): 40 mg via ORAL
  Filled 2020-07-22 (×2): qty 1

## 2020-07-22 MED ORDER — ASPIRIN EC 325 MG PO TBEC
325.0000 mg | DELAYED_RELEASE_TABLET | Freq: Every day | ORAL | Status: DC
  Administered 2020-07-23 – 2020-07-25 (×3): 325 mg via ORAL
  Filled 2020-07-22 (×3): qty 1

## 2020-07-22 MED ORDER — PANTOPRAZOLE SODIUM 40 MG PO TBEC
40.0000 mg | DELAYED_RELEASE_TABLET | Freq: Every day | ORAL | Status: DC
  Administered 2020-07-23 – 2020-07-25 (×3): 40 mg via ORAL
  Filled 2020-07-22 (×3): qty 1

## 2020-07-22 MED FILL — Gelatin Absorbable MT Powder: OROMUCOSAL | Qty: 1 | Status: AC

## 2020-07-22 NOTE — Plan of Care (Signed)
  Problem: Health Behavior/Discharge Planning: Goal: Ability to manage health-related needs will improve Outcome: Progressing   Problem: Clinical Measurements: Goal: Will remain free from infection Outcome: Progressing Goal: Respiratory complications will improve Outcome: Progressing Goal: Cardiovascular complication will be avoided Outcome: Progressing   

## 2020-07-22 NOTE — Progress Notes (Signed)
Patient ID: Brenda Wilkins, female   DOB: [birth year], 74 y.o.   MRN: [id number] EVENING ROUNDS NOTE :     [address]       [place] [address]             [phone]                 4 Days Post-Op Procedure(s) (LRB): CORONARY ARTERY BYPASS GRAFTING (CABG), ON PUMP, TIMES THREE, USING ENDOSCOPICALLY HARVESTED RIGHT GREATER SAPHENOUS VEIN (N/A) TRANSESOPHAGEAL ECHOCARDIOGRAM (TEE) (N/A)  Total Length of Stay:  LOS: 6 days  BP (!) 98/46   Pulse 87   Temp 98.5 F (36.9 C) (Oral)   Resp 17   Ht 5' (1.524 m)   Wt 72.7 kg   SpO2 93%   BMI 31.30 kg/m   .Intake/Output      12/16 0701 12/17 0700 12/17 0701 12/18 0700   P.O. 627 120   I.V. (mL/kg) 32.8 (0.5)    IV Piggyback     Total Intake(mL/kg) 659.8 (9.1) 120 (1.7)   Urine (mL/kg/hr) 625 (0.4)    Total Output 625    Net +34.8 +120        Urine Occurrence 1 x 1 x   Stool Occurrence  1 x     . sodium chloride       Lab Results  Component Value Date   WBC 12.0 (H) 07/21/2020   HGB 9.1 (L) 07/21/2020   HCT 29.5 (L) 07/21/2020   PLT 150 07/21/2020   GLUCOSE 104 (H) 07/21/2020   CHOL 254 (H) 07/17/2020   TRIG 153 (H) 07/17/2020   HDL 40 (L) 07/17/2020   LDLCALC 183 (H) 07/17/2020   ALT 30 07/16/2020   AST 30 07/16/2020   NA 139 07/21/2020   K 4.5 07/21/2020   CL 106 07/21/2020   CREATININE 1.02 (H) 07/21/2020   BUN 14 07/21/2020   CO2 25 07/21/2020   TSH 2.699 07/17/2020   INR 1.3 (H) 07/18/2020   HGBA1C 5.6 07/17/2020   Stable day Sinus  waiting for transfer to 4e   Delight Ovens MD  [address]eeper [phone] Office [phone] 07/22/2020 4:21 PM

## 2020-07-22 NOTE — Progress Notes (Signed)
4 Days Post-Op Procedure(s) (LRB): CORONARY ARTERY BYPASS GRAFTING (CABG), ON PUMP, TIMES THREE, USING ENDOSCOPICALLY HARVESTED RIGHT GREATER SAPHENOUS VEIN (N/A) TRANSESOPHAGEAL ECHOCARDIOGRAM (TEE) (N/A) Subjective: Feels better. Ambulated this am.  Objective: Vital signs in last 24 hours: Temp:  [97.7 F (36.5 C)-100 F (37.8 C)] 98.1 F (36.7 C) (12/17 0742) Pulse Rate:  [84-98] 89 (12/17 0700) Cardiac Rhythm: Normal sinus rhythm (12/17 0400) Resp:  [14-25] 19 (12/17 0700) BP: (93-121)/(44-66) 94/66 (12/17 0700) SpO2:  [88 %-100 %] 100 % (12/17 0700) Weight:  [72.7 kg] 72.7 kg (12/17 0600)  Hemodynamic parameters for last 24 hours:    Intake/Output from previous day: 12/16 0701 - 12/17 0700 In: 659.8 [P.O.:627; I.V.:32.8] Out: 625 [Urine:625] Intake/Output this shift: No intake/output data recorded.  General appearance: alert and cooperative Neurologic: intact Heart: regular rate and rhythm, S1, S2 normal, no murmur Lungs: clear to auscultation bilaterally Extremities: edema mild Wound: incision ok  Lab Results: Recent Labs    07/20/20 0329 07/21/20 0510  WBC 10.7* 12.0*  HGB 8.7* 9.1*  HCT 27.9* 29.5*  PLT 100* 150   BMET:  Recent Labs    07/20/20 0329 07/21/20 0510  NA 137 139  K 4.1 4.5  CL 104 106  CO2 25 25  GLUCOSE 118* 104*  BUN 11 14  CREATININE 0.90 1.02*  CALCIUM 7.7* 8.0*    PT/INR: No results for input(s): LABPROT, INR in the last 72 hours. ABG    Component Value Date/Time   PHART 7.353 07/19/2020 0913   HCO3 24.7 07/19/2020 0913   TCO2 26 07/19/2020 0913   ACIDBASEDEF 1.0 07/19/2020 0913   O2SAT 96.0 07/19/2020 0913   CBG (last 3)  Recent Labs    07/21/20 1125 07/21/20 1200 07/21/20 1544  GLUCAP 100* 95 89    Assessment/Plan: S/P Procedure(s) (LRB): CORONARY ARTERY BYPASS GRAFTING (CABG), ON PUMP, TIMES THREE, USING ENDOSCOPICALLY HARVESTED RIGHT GREATER SAPHENOUS VEIN (N/A) TRANSESOPHAGEAL ECHOCARDIOGRAM (TEE)  (N/A)  POD 4 Hemodynamically stable off IV pressors. Still on midodrine 10 tid with SBP 100-105. Decrease to 5 tid tomorrow if BP stable and home on 5 bid. I will stop in the office if BP remains stable. No beta blocker for now.  Volume excess: wt gradually coming down but 5 lbs over preop. Will give daily lasix for 3 days with K+.  DC pacing wires.  Plan ASA and Plavix at discharge.  Transfer to 4E and continue ambulation and IS.   Probably home Sunday if no changes.  Discussed status at bedside with son.   LOS: 6 days    Alleen Borne 07/22/2020

## 2020-07-22 NOTE — Progress Notes (Signed)
CARDIAC REHAB PHASE I   PRE:  Rate/Rhythm: 92 SR    BP: sitting 95/46    SaO2: 97 RA  MODE:  Ambulation: 370 ft   POST:  Rate/Rhythm: 110 ST    BP: sitting 121/57     SaO2: 97 RA  Pt able to get out of bed fairly independent with head of bed in upright position. Walked with RW in hall, min assist to help steer RW. Some SOB but no major c/o. Son present and supportive, will be staying in town for a while. Daughter is also in town. Pt return to bed as recliner hurts her rear. Practiced IS, 750 mL. Encouraged more IS and walking. Pt receptive. [phone]   Harriet Masson CES, ACSM 07/22/2020 3:43 PM

## 2020-07-23 ENCOUNTER — Inpatient Hospital Stay (HOSPITAL_COMMUNITY): Payer: Medicare HMO

## 2020-07-23 MED ORDER — METOPROLOL TARTRATE 12.5 MG HALF TABLET
12.5000 mg | ORAL_TABLET | Freq: Two times a day (BID) | ORAL | Status: DC
  Administered 2020-07-23 – 2020-07-25 (×5): 12.5 mg via ORAL
  Filled 2020-07-23 (×5): qty 1

## 2020-07-23 NOTE — Progress Notes (Signed)
11:15 - 12:18  Provided OHS/CABG education to patient and daughter. Pt has OHS booklet. Reviewed "move in the tube" concept for sternal precautions. Discussed bathing, wound care, activity restrictions, precautions, daily weights, S/S to report to MD, activity progression, continued use of I/S, heart healthy and low Na diet, medication compliance, follow-up with MD(s), and the psychological/emotional impact of surgery. Pt will be referred to the [facility] CRP2 program. Stressed mask use and good hand hygiene s/p surgery in the context of rising COVID-19 cases.  Pt's daughter and son will care for her upon discharge home. Pt and daughter verbalized understanding of the discharge education.   Lorin Picket MS, ACSM-EP-C, CCRP

## 2020-07-23 NOTE — Progress Notes (Signed)
Patient ambulated in hallway with nursing staff and front wheel walker. Patient standby assist. Ambulated 450 feet. Back in bed call bell within reach. Will monitor.Koree Staheli, Randall An RN

## 2020-07-23 NOTE — Progress Notes (Signed)
Mobility Specialist - Progress Note   07/23/20 1311  Mobility  Activity Ambulated in hall  Level of Assistance Minimal assist, patient does 75% or more  Assistive Device Front wheel walker  Distance Ambulated (ft) 450 ft  Mobility Response Tolerated well  Mobility performed by Mobility specialist  $Mobility charge 1 Mobility   Pre-mobility: 88 HR During mobility: 108 HR Post-mobility: 84 HR  Pt walked 225 ft w/ RW and 225 ft w/o an AD. Asx throughout. To recliner after walk to eat lunch.   Mamie Levers Mobility Specialist Mobility Specialist Phone: [phone]

## 2020-07-23 NOTE — Progress Notes (Addendum)
[address]       [place] [address]             [phone]      5 Days Post-Op Procedure(s) (LRB): CORONARY ARTERY BYPASS GRAFTING (CABG), ON PUMP, TIMES THREE, USING ENDOSCOPICALLY HARVESTED RIGHT GREATER SAPHENOUS VEIN (N/A) TRANSESOPHAGEAL ECHOCARDIOGRAM (TEE) (N/A) Subjective: Doing well  Objective: Vital signs in last 24 hours: Temp:  [98.2 F (36.8 C)-99.4 F (37.4 C)] 98.5 F (36.9 C) (12/18 0349) Pulse Rate:  [87-98] 93 (12/18 0349) Cardiac Rhythm: Normal sinus rhythm (12/17 1900) Resp:  [17-24] 19 (12/18 0349) BP: (95-117)/(46-64) 107/55 (12/18 0349) SpO2:  [92 %-100 %] 95 % (12/18 0349) FiO2 (%):  [22 %] 22 % (12/17 1026) Weight:  [70.4 kg] 70.4 kg (12/18 0500)  Hemodynamic parameters for last 24 hours:    Intake/Output from previous day: 12/17 0701 - 12/18 0700 In: 120 [P.O.:120] Out: -  Intake/Output this shift: No intake/output data recorded.  General appearance: alert, cooperative and no distress Heart: regular rate and rhythm Lungs: clear to auscultation bilaterally Abdomen: benign Extremities: minor edema Wound: incis healing well  Lab Results: Recent Labs    07/21/20 0510  WBC 12.0*  HGB 9.1*  HCT 29.5*  PLT 150   BMET:  Recent Labs    07/21/20 0510  NA 139  K 4.5  CL 106  CO2 25  GLUCOSE 104*  BUN 14  CREATININE 1.02*  CALCIUM 8.0*    PT/INR: No results for input(s): LABPROT, INR in the last 72 hours. ABG    Component Value Date/Time   PHART 7.353 07/19/2020 0913   HCO3 24.7 07/19/2020 0913   TCO2 26 07/19/2020 0913   ACIDBASEDEF 1.0 07/19/2020 0913   O2SAT 96.0 07/19/2020 0913   CBG (last 3)  Recent Labs    07/21/20 1125 07/21/20 1200 07/21/20 1544  GLUCAP 100* 95 89    Meds Scheduled Meds: . aspirin EC  325 mg Oral Daily  . atorvastatin  80 mg Oral Daily  . Chlorhexidine Gluconate Cloth  6 each Topical Daily  . ferrous fumarate-b12-vitamic C-folic acid  1 capsule Oral BID PC  .  furosemide  40 mg Oral Daily  . mouth rinse  15 mL Mouth Rinse BID  . midodrine  10 mg Oral TID WC  . pantoprazole  40 mg Oral QAC breakfast  . potassium chloride  20 mEq Oral Daily  . sodium chloride flush  3 mL Intravenous Q12H   Continuous Infusions: . sodium chloride     PRN Meds:.sodium chloride, acetaminophen, ondansetron **OR** ondansetron (ZOFRAN) IV, senna-docusate, sodium chloride flush, traMADol  Xrays No results found.  Assessment/Plan: S/P Procedure(s) (LRB): CORONARY ARTERY BYPASS GRAFTING (CABG), ON PUMP, TIMES THREE, USING ENDOSCOPICALLY HARVESTED RIGHT GREATER SAPHENOUS VEIN (N/A) TRANSESOPHAGEAL ECHOCARDIOGRAM (TEE) (N/A)  1 afeb, VSS, some low BP readings- on midodrine 2 sinus rhythm, very short burst of afib, will add low dose beta blocker 3 sats good on 1 liter/RA currently 4 good UOP, weight at about preop, cont gentle diuresis- stop soon 5 BS ok- no new labs, very stable yesterday 6 CXR -very small effus L>R 7 cont rehab and pulm toilet 8 poss home 1-2 days  LOS: 7 days    Rowe Clack PA-C Pager [phone] 07/23/2020 Progressing well Home poss Monday I have seen and examined Brenda Wilkins and agree with the above assessment  and plan.  Delight Ovens MD Beeper [phone] Office [phone] 07/23/2020 1:22  PM   

## 2020-07-24 LAB — CBC
HCT: 29.7 % — ABNORMAL LOW (ref 36.0–46.0)
Hemoglobin: 9.5 g/dL — ABNORMAL LOW (ref 12.0–15.0)
MCH: 29.6 pg (ref 26.0–34.0)
MCHC: 32 g/dL (ref 30.0–36.0)
MCV: 92.5 fL (ref 80.0–100.0)
Platelets: 285 10*3/uL (ref 150–400)
RBC: 3.21 MIL/uL — ABNORMAL LOW (ref 3.87–5.11)
RDW: 14.4 % (ref 11.5–15.5)
WBC: 10 10*3/uL (ref 4.0–10.5)
nRBC: 0 % (ref 0.0–0.2)

## 2020-07-24 LAB — BASIC METABOLIC PANEL
Anion gap: 8 (ref 5–15)
BUN: 14 mg/dL (ref 8–23)
CO2: 24 mmol/L (ref 22–32)
Calcium: 7.8 mg/dL — ABNORMAL LOW (ref 8.9–10.3)
Chloride: 106 mmol/L (ref 98–111)
Creatinine, Ser: 0.92 mg/dL (ref 0.44–1.00)
GFR, Estimated: >60 mL/min (ref >60)
Glucose, Bld: 106 mg/dL — ABNORMAL HIGH (ref 70–99)
Potassium: 3.6 mmol/L (ref 3.5–5.1)
Sodium: 138 mmol/L (ref 135–145)

## 2020-07-24 MED ORDER — MIDODRINE HCL 5 MG PO TABS
5.0000 mg | ORAL_TABLET | Freq: Three times a day (TID) | ORAL | Status: DC
Start: 2020-07-24 — End: 2020-07-25
  Filled 2020-07-24: qty 1

## 2020-07-24 NOTE — Progress Notes (Addendum)
[address]       [place] [address]             [phone]      6 Days Post-Op Procedure(s) (LRB): CORONARY ARTERY BYPASS GRAFTING (CABG), ON PUMP, TIMES THREE, USING ENDOSCOPICALLY HARVESTED RIGHT GREATER SAPHENOUS VEIN (N/A) TRANSESOPHAGEAL ECHOCARDIOGRAM (TEE) (N/A) Subjective: Feels well, no specific c/o  Objective: Vital signs in last 24 hours: Temp:  [97.7 F (36.5 C)-99.7 F (37.6 C)] 98.7 F (37.1 C) (12/19 0752) Pulse Rate:  [86-100] 100 (12/19 0752) Cardiac Rhythm: Normal sinus rhythm (12/19 0701) Resp:  [16-19] 16 (12/19 0752) BP: (101-132)/(50-69) 132/69 (12/19 0752) SpO2:  [91 %-99 %] 91 % (12/19 0752) Weight:  [70.3 kg] 70.3 kg (12/19 0542)  Hemodynamic parameters for last 24 hours:    Intake/Output from previous day: 12/18 0701 - 12/19 0700 In: 480 [P.O.:480] Out: 2 [Urine:2] Intake/Output this shift: No intake/output data recorded.  General appearance: alert, cooperative and no distress Heart: regular rate and rhythm Lungs: clear to auscultation bilaterally Abdomen: benign Extremities: no edema Wound: incis healing well  Lab Results: Recent Labs    07/24/20 0048  WBC 10.0  HGB 9.5*  HCT 29.7*  PLT 285   BMET:  Recent Labs    07/24/20 0048  NA 138  K 3.6  CL 106  CO2 24  GLUCOSE 106*  BUN 14  CREATININE 0.92  CALCIUM 7.8*    PT/INR: No results for input(s): LABPROT, INR in the last 72 hours. ABG    Component Value Date/Time   PHART 7.353 07/19/2020 0913   HCO3 24.7 07/19/2020 0913   TCO2 26 07/19/2020 0913   ACIDBASEDEF 1.0 07/19/2020 0913   O2SAT 96.0 07/19/2020 0913   CBG (last 3)  Recent Labs    07/21/20 1125 07/21/20 1200 07/21/20 1544  GLUCAP 100* 95 89    Meds Scheduled Meds: . aspirin EC  325 mg Oral Daily  . atorvastatin  80 mg Oral Daily  . Chlorhexidine Gluconate Cloth  6 each Topical Daily  . ferrous fumarate-b12-vitamic C-folic acid  1 capsule Oral BID PC  . furosemide  40  mg Oral Daily  . mouth rinse  15 mL Mouth Rinse BID  . metoprolol tartrate  12.5 mg Oral BID  . midodrine  10 mg Oral TID WC  . pantoprazole  40 mg Oral QAC breakfast  . potassium chloride  20 mEq Oral Daily  . sodium chloride flush  3 mL Intravenous Q12H   Continuous Infusions: . sodium chloride     PRN Meds:.sodium chloride, acetaminophen, ondansetron **OR** ondansetron (ZOFRAN) IV, senna-docusate, sodium chloride flush, traMADol  Xrays DG Chest 2 View  Result Date: 07/23/2020 CLINICAL DATA:  History of CABG EXAM: CHEST - 2 VIEW COMPARISON:  07/20/2020 chest radiograph. FINDINGS: Intact sternotomy wires. Stable cardiomediastinal silhouette with mild cardiomegaly. No pneumothorax. Small bilateral pleural effusions, stable. No pulmonary edema. Mild bibasilar atelectasis, similar. IMPRESSION: 1. Stable mild cardiomegaly without pulmonary edema. 2. Stable small bilateral pleural effusions and mild bibasilar atelectasis. Electronically Signed   By: Delbert Phenix M.D.   On: 07/23/2020 10:04    Assessment/Plan: S/P Procedure(s) (LRB): CORONARY ARTERY BYPASS GRAFTING (CABG), ON PUMP, TIMES THREE, USING ENDOSCOPICALLY HARVESTED RIGHT GREATER SAPHENOUS VEIN (N/A) TRANSESOPHAGEAL ECHOCARDIOGRAM (TEE) (N/A)  1 Tmax 99.7, VSS, sinus rhythm, BO adeq controlled- prob too low to initiate ACE-I/ARB - cont to monitor 2 sats ok on RA 3 leukocytosis resolved 4 ABLA stable 5 renal fxn  normal, weight at preop, will d/c lasix 6 cont rehab modalities/pulm  toilet 7 prob home in am   LOS: 8 days    Rowe Clack PA-C Pager [phone] 07/24/2020  continues to improve poss home in am Decrease midodrine to 5 mg tid  I have seen and examined Brenda Wilkins and agree with the above assessment  and plan.  Delight Ovens MD Beeper [phone] Office [phone] 07/24/2020 12:41 PM

## 2020-07-24 NOTE — Progress Notes (Signed)
Mobility Specialist - Progress Note   07/24/20 1129  Mobility  Activity Ambulated in hall  Level of Assistance Standby assist, set-up cues, supervision of patient - no hands on  Assistive Device Front wheel walker  Distance Ambulated (ft) 470 ft  Mobility Response Tolerated well  Mobility performed by Mobility specialist;Family member  $Mobility charge 1 Mobility   Pre-mobility: 90 HR During mobility: 106 HR Post-mobility: 89 HR  Accompanied by pt's son. She walked to the end of the hallway w/ the RW and walked back w/o it. Pt lost balance once when ambulating w/o the RW but was able to correct herself w/o assistance. Sitting on edge of bed after walk, encouraged continued ambulation with her son.   Mamie Levers Mobility Specialist Mobility Specialist Phone: [phone]

## 2020-07-24 NOTE — Plan of Care (Signed)
  Problem: Clinical Measurements: Goal: Will remain free from infection Outcome: Progressing   Problem: Education: Goal: Knowledge of General Education information will improve Description: Including pain rating scale, medication(s)/side effects and non-pharmacologic comfort measures Outcome: Adequate for Discharge   Problem: Clinical Measurements: Goal: Ability to maintain clinical measurements within normal limits will improve Outcome: Adequate for Discharge Goal: Diagnostic test results will improve Outcome: Adequate for Discharge

## 2020-07-24 NOTE — Plan of Care (Signed)
  Problem: Education: Goal: Knowledge of General Education information will improve Description: Including pain rating scale, medication(s)/side effects and non-pharmacologic comfort measures Outcome: Progressing   Problem: Health Behavior/Discharge Planning: Goal: Ability to manage health-related needs will improve Outcome: Progressing   Problem: Clinical Measurements: Goal: Will remain free from infection Outcome: Progressing   

## 2020-07-25 MED ORDER — TRAMADOL HCL 50 MG PO TABS: 50.0000 mg | ORAL_TABLET | Freq: Four times a day (QID) | ORAL | 0 refills | Status: AC | PRN

## 2020-07-25 MED ORDER — FE FUMARATE-B12-VIT C-FA-IFC PO CAPS: 1.0000 | ORAL_CAPSULE | Freq: Two times a day (BID) | ORAL | 1 refills | Status: DC

## 2020-07-25 MED ORDER — ASPIRIN 325 MG PO TBEC: 325.0000 mg | DELAYED_RELEASE_TABLET | Freq: Every day | ORAL | Status: DC

## 2020-07-25 MED ORDER — MIDODRINE HCL 5 MG PO TABS: 5.0000 mg | ORAL_TABLET | Freq: Three times a day (TID) | ORAL | 1 refills | Status: DC

## 2020-07-25 MED ORDER — METOPROLOL TARTRATE 25 MG PO TABS: 12.5000 mg | ORAL_TABLET | Freq: Two times a day (BID) | ORAL | 1 refills | Status: DC

## 2020-07-25 MED ORDER — MITIGARE 0.6 MG PO CAPS: 0.6000 mg | ORAL_CAPSULE | Freq: Two times a day (BID) | ORAL | 0 refills | Status: DC | PRN

## 2020-07-25 MED ORDER — ATORVASTATIN CALCIUM 80 MG PO TABS: 80.0000 mg | ORAL_TABLET | Freq: Every day | ORAL | 1 refills | Status: DC

## 2020-07-25 NOTE — Progress Notes (Addendum)
      [address]       [place] [address]             [phone]      7 Days Post-Op Procedure(s) (LRB): CORONARY ARTERY BYPASS GRAFTING (CABG), ON PUMP, TIMES THREE, USING ENDOSCOPICALLY HARVESTED RIGHT GREATER SAPHENOUS VEIN (N/A) TRANSESOPHAGEAL ECHOCARDIOGRAM (TEE) (N/A) Subjective: Feels good this morning. Ready to go home.   Objective: Vital signs in last 24 hours: Temp:  [98 F (36.7 C)-99.2 F (37.3 C)] 98.2 F (36.8 C) (12/20 0453) Pulse Rate:  [83-100] 88 (12/20 0450) Cardiac Rhythm: Normal sinus rhythm (12/19 1916) Resp:  [16-18] 18 (12/19 2330) BP: (111-132)/(56-69) 114/63 (12/20 0450) SpO2:  [91 %-99 %] 98 % (12/20 0450) Weight:  [69.9 kg] 69.9 kg (12/20 0450)    Intake/Output from previous day: 12/19 0701 - 12/20 0700 In: 800 [P.O.:800] Out: 4 [Urine:4] Intake/Output this shift: No intake/output data recorded.  General appearance: alert, cooperative and no distress Heart: regular rate and rhythm, S1, S2 normal, no murmur, click, rub or gallop Lungs: clear to auscultation bilaterally Abdomen: soft, non-tender; bowel sounds normal; no masses,  no organomegaly Extremities: extremities normal, atraumatic, no cyanosis or edema Wound: clean and dry  Lab Results: Recent Labs    07/24/20 0048  WBC 10.0  HGB 9.5*  HCT 29.7*  PLT 285   BMET:  Recent Labs    07/24/20 0048  NA 138  K 3.6  CL 106  CO2 24  GLUCOSE 106*  BUN 14  CREATININE 0.92  CALCIUM 7.8*    PT/INR: No results for input(s): LABPROT, INR in the last 72 hours. ABG    Component Value Date/Time   PHART 7.353 07/19/2020 0913   HCO3 24.7 07/19/2020 0913   TCO2 26 07/19/2020 0913   ACIDBASEDEF 1.0 07/19/2020 0913   O2SAT 96.0 07/19/2020 0913   CBG (last 3)  No results for input(s): GLUCAP in the last 72 hours.  Assessment/Plan: S/P Procedure(s) (LRB): CORONARY ARTERY BYPASS GRAFTING (CABG), ON PUMP, TIMES THREE, USING ENDOSCOPICALLY HARVESTED RIGHT GREATER  SAPHENOUS VEIN (N/A) TRANSESOPHAGEAL ECHOCARDIOGRAM (TEE) (N/A)  1. CV-NSR in the 80s, BP well controlled. Contine asa, statin, and BB 2. Pulm-CXR over the weekend shows small bilateral pleural effusions and mild bibasilar atelectasis.  3. Renal- creatinine 0.92, Potassium 3.6 yesterday and does not seem like it was replaced. Lasix discontinued.  4. H and H stable 9.5/29.7 5. Pain is well controlled  Plan: Feels okay this morning. Hopefully can go home today. Rhythm has been stable, off oxygen.    LOS: 9 days    Sharlene Dory 07/25/2020   Chart reviewed, patient examined, agree with above. She looks good. Only complaint is of some pain in great toe that just started today and is mild with a hx of a prior gout episode in that toe treated with colchicine. Her toes looks fine but will send her home with a prescription for a few doses of colchicine to take if she has gout develop. She looks good for discharge today with son.

## 2020-07-25 NOTE — Progress Notes (Signed)
Checked with pt. Doing well, no questions regarding education. Eager to d/c. Ethelda Chick CES, ACSM 10:04 AM 07/25/2020 [phone]

## 2020-07-25 NOTE — Progress Notes (Signed)
D/C instructions given to patient. Medications and wound care reviewed. All questions answered. IV removed, clean and intact. Son to escort pt home.  Versie Starks, RN

## 2020-07-27 ENCOUNTER — Telehealth (HOSPITAL_COMMUNITY): Payer: Self-pay

## 2020-07-27 NOTE — Telephone Encounter (Signed)
Attempted to call patient in regards to Cardiac Rehab - LM on VM 

## 2020-07-28 ENCOUNTER — Other Ambulatory Visit: Payer: Self-pay

## 2020-07-28 ENCOUNTER — Ambulatory Visit (INDEPENDENT_AMBULATORY_CARE_PROVIDER_SITE_OTHER): Payer: Self-pay

## 2020-07-28 DIAGNOSIS — Z4802 Encounter for removal of sutures: Secondary | ICD-10-CM

## 2020-07-28 NOTE — Progress Notes (Signed)
Patient arrived for nurse visit to remove 3 sutures post- procedure CABG with Dr. Laneta Simmers 07/18/20.  Sutures removed with no signs/ symptoms of infection noted.  Patient tolerated procedure well.  Patient/ family instructed to keep the incision sites clean and dry.  Patient/ family acknowledged instructions given.   Patient stated that she has not taken any pain medication since she has been home from the hospital.  Seems to be recovering well. She is aware of her follow-up appointments and knows she needs to call her Cardiology office for a follow-up after discharge from the hospital.

## 2020-08-07 ENCOUNTER — Encounter (HOSPITAL_COMMUNITY): Payer: Self-pay | Admitting: Emergency Medicine

## 2020-08-07 ENCOUNTER — Inpatient Hospital Stay (HOSPITAL_COMMUNITY)
Admission: EM | Admit: 2020-08-07 | Discharge: 2020-08-09 | DRG: 281 | Disposition: A | Discharge status: Home / Self Care | Payer: Medicare HMO | Attending: Cardiology | Admitting: Cardiology

## 2020-08-07 ENCOUNTER — Other Ambulatory Visit: Payer: Self-pay

## 2020-08-07 ENCOUNTER — Inpatient Hospital Stay (HOSPITAL_COMMUNITY): Payer: Medicare HMO

## 2020-08-07 DIAGNOSIS — Y832 Surgical operation with anastomosis, bypass or graft as the cause of abnormal reaction of the patient, or of later complication, without mention of misadventure at the time of the procedure: Secondary | ICD-10-CM | POA: Diagnosis present

## 2020-08-07 DIAGNOSIS — I2510 Atherosclerotic heart disease of native coronary artery without angina pectoris: Secondary | ICD-10-CM | POA: Diagnosis not present

## 2020-08-07 DIAGNOSIS — Z951 Presence of aortocoronary bypass graft: Secondary | ICD-10-CM

## 2020-08-07 DIAGNOSIS — T82868A Thrombosis of vascular prosthetic devices, implants and grafts, initial encounter: Secondary | ICD-10-CM | POA: Diagnosis not present

## 2020-08-07 DIAGNOSIS — I222 Subsequent non-ST elevation (NSTEMI) myocardial infarction: Secondary | ICD-10-CM | POA: Diagnosis not present

## 2020-08-07 DIAGNOSIS — Z7982 Long term (current) use of aspirin: Secondary | ICD-10-CM

## 2020-08-07 DIAGNOSIS — Z20822 Contact with and (suspected) exposure to covid-19: Secondary | ICD-10-CM | POA: Diagnosis not present

## 2020-08-07 DIAGNOSIS — I214 Non-ST elevation (NSTEMI) myocardial infarction: Principal | ICD-10-CM | POA: Diagnosis present

## 2020-08-07 DIAGNOSIS — E785 Hyperlipidemia, unspecified: Secondary | ICD-10-CM | POA: Diagnosis not present

## 2020-08-07 DIAGNOSIS — I25810 Atherosclerosis of coronary artery bypass graft(s) without angina pectoris: Secondary | ICD-10-CM | POA: Diagnosis not present

## 2020-08-07 DIAGNOSIS — Z79899 Other long term (current) drug therapy: Secondary | ICD-10-CM | POA: Diagnosis not present

## 2020-08-07 HISTORY — DX: Atherosclerotic heart disease of native coronary artery without angina pectoris: I25.10

## 2020-08-07 LAB — CBC
HCT: 39.5 % (ref 36.0–46.0)
Hemoglobin: 12 g/dL (ref 12.0–15.0)
MCH: 28.5 pg (ref 26.0–34.0)
MCHC: 30.4 g/dL (ref 30.0–36.0)
MCV: 93.8 fL (ref 80.0–100.0)
Platelets: 400 10*3/uL (ref 150–400)
RBC: 4.21 MIL/uL (ref 3.87–5.11)
RDW: 13.9 % (ref 11.5–15.5)
WBC: 9.1 10*3/uL (ref 4.0–10.5)
nRBC: 0 % (ref 0.0–0.2)

## 2020-08-07 LAB — RESP PANEL BY RT-PCR (FLU A&B, COVID) ARPGX2
Influenza A by PCR: NEGATIVE
Influenza B by PCR: NEGATIVE
SARS Coronavirus 2 by RT PCR: NEGATIVE

## 2020-08-07 LAB — TROPONIN I (HIGH SENSITIVITY)
Troponin I (High Sensitivity): 31 ng/L — ABNORMAL HIGH (ref <18)
Troponin I (High Sensitivity): 101 ng/L (ref <18)

## 2020-08-07 LAB — BASIC METABOLIC PANEL
Anion gap: 11 (ref 5–15)
BUN: 18 mg/dL (ref 8–23)
CO2: 19 mmol/L — ABNORMAL LOW (ref 22–32)
Calcium: 9 mg/dL (ref 8.9–10.3)
Chloride: 109 mmol/L (ref 98–111)
Creatinine, Ser: 0.83 mg/dL (ref 0.44–1.00)
GFR, Estimated: >60 mL/min (ref >60)
Glucose, Bld: 131 mg/dL — ABNORMAL HIGH (ref 70–99)
Potassium: 4.2 mmol/L (ref 3.5–5.1)
Sodium: 139 mmol/L (ref 135–145)

## 2020-08-07 MED ORDER — FE FUMARATE-B12-VIT C-FA-IFC PO CAPS
1.0000 | ORAL_CAPSULE | Freq: Two times a day (BID) | ORAL | Status: DC
  Administered 2020-08-09: 1 via ORAL
  Filled 2020-08-07 (×4): qty 1

## 2020-08-07 MED ORDER — ACETAMINOPHEN 325 MG PO TABS: 650.0000 mg | ORAL_TABLET | ORAL | Status: DC | PRN

## 2020-08-07 MED ORDER — ASPIRIN EC 81 MG PO TBEC: 81.0000 mg | DELAYED_RELEASE_TABLET | Freq: Every day | ORAL | Status: DC

## 2020-08-07 MED ORDER — HEPARIN BOLUS VIA INFUSION
3650.0000 [IU] | Freq: Once | INTRAVENOUS | Status: AC
  Administered 2020-08-07: 3650 [IU] via INTRAVENOUS
  Filled 2020-08-07: qty 3650

## 2020-08-07 MED ORDER — HEPARIN (PORCINE) 25000 UT/250ML-% IV SOLN
950.0000 [IU]/h | INTRAVENOUS | Status: DC
  Administered 2020-08-07: 750 [IU]/h via INTRAVENOUS
  Filled 2020-08-07: qty 250

## 2020-08-07 MED ORDER — NITROGLYCERIN 0.4 MG SL SUBL: 0.4000 mg | SUBLINGUAL_TABLET | SUBLINGUAL | Status: DC | PRN

## 2020-08-07 MED ORDER — ONDANSETRON HCL 4 MG/2ML IJ SOLN: 4.0000 mg | Freq: Four times a day (QID) | INTRAMUSCULAR | Status: DC | PRN

## 2020-08-07 MED ORDER — ASPIRIN EC 325 MG PO TBEC: 325.0000 mg | DELAYED_RELEASE_TABLET | Freq: Every day | ORAL | Status: DC

## 2020-08-07 MED ORDER — IOHEXOL 350 MG/ML SOLN: 75.0000 mL | Freq: Once | INTRAVENOUS | Status: DC | PRN

## 2020-08-07 MED ORDER — ACETAMINOPHEN 325 MG PO TABS: 650.0000 mg | ORAL_TABLET | Freq: Four times a day (QID) | ORAL | Status: DC | PRN

## 2020-08-07 MED ORDER — ATORVASTATIN CALCIUM 80 MG PO TABS
80.0000 mg | ORAL_TABLET | Freq: Every day | ORAL | Status: DC
  Administered 2020-08-08 – 2020-08-09 (×2): 80 mg via ORAL
  Filled 2020-08-07 (×2): qty 1

## 2020-08-07 MED ORDER — METOPROLOL TARTRATE 12.5 MG HALF TABLET
12.5000 mg | ORAL_TABLET | Freq: Two times a day (BID) | ORAL | Status: DC
Start: 2020-08-07 — End: 2020-08-09
  Administered 2020-08-07 – 2020-08-09 (×4): 12.5 mg via ORAL
  Filled 2020-08-07 (×5): qty 1

## 2020-08-07 NOTE — ED Provider Notes (Signed)
[facility] EMERGENCY DEPARTMENT Provider Note   CSN: [id number] Arrival date & time: 08/07/20  1529     History Chief Complaint  Patient presents with  . Chest Pain    Brenda Wilkins is a 75 y.o. female.  Presents to ER with concern for episode of chest pain.  Patient reports around 2:00 this afternoon she had an episode of pain in her upper back as well as her center of her chest.  Mild to moderate.  Not associated with exertion, no shortness of breath.  Felt like indigestion.  Lasted approximately 1 hour.  No recurrence since that time.  No missed doses of her medications, took full aspirin this morning.  States that she had been doing very well ever since discharge.  Per chart review, recent admission for NSTEMI, underwent CABG x3.  HPI     Past Medical History:  Diagnosis Date  . Hyperlipidemia   . NSTEMI (non-ST elevated myocardial infarction) (HCC) 07/16/2020  . 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 NSTEMI (non-ST elevated myocardial infarction) (HCC) 07/16/2020  . Hyperlipidemia 07/16/2020    Past Surgical History:  Procedure Laterality Date  . CORONARY ARTERY BYPASS GRAFT N/A 07/18/2020   Procedure: CORONARY ARTERY BYPASS GRAFTING (CABG), ON PUMP, TIMES THREE, USING ENDOSCOPICALLY HARVESTED RIGHT GREATER SAPHENOUS VEIN;  Surgeon: Alleen Borne, MD;  Location: [facility] OR;  Service: Open Heart Surgery;  Laterality: N/A;  . CORONARY/GRAFT ACUTE MI REVASCULARIZATION N/A 07/18/2020   Procedure: Coronary/Graft Acute MI Revascularization;  Surgeon: Swazil[place], Peter M, MD;  Location: [facility] INVASIVE CV LAB;  Service: Cardiovascular;  Laterality: N/A;  . LEFT HEART CATH AND CORONARY ANGIOGRAPHY N/A 07/18/2020   Procedure: LEFT HEART CATH AND CORONARY ANGIOGRAPHY;  Surgeon: Tonny Bollman, MD;  Location: [facility] INVASIVE CV LAB;  Service: Cardiovascular;  Laterality: N/A;  . TEE WITHOUT CARDIOVERSION  N/A 07/18/2020   Procedure: TRANSESOPHAGEAL ECHOCARDIOGRAM (TEE);  Surgeon: Alleen Borne, MD;  Location: [facility] OR;  Service: Open Heart Surgery;  Laterality: N/A;     OB History   No obstetric history on file.     No family history on file.  Social History   Tobacco Use  . Smoking status: Never Smoker  . Smokeless tobacco: Never Used  Substance Use Topics  . Alcohol use: Never  . Drug use: Never    Home Medications Prior to Admission medications   Medication Sig Start Date End Date Taking? Authorizing Provider  acetaminophen (TYLENOL) 325 MG tablet Take 325-650 mg by mouth every 6 (six) hours as needed for mild pain (or headaches).    [provider]  aspirin EC 325 MG EC tablet Take 1 tablet (325 mg total) by mouth daily. 07/25/20   Sharlene Dory, PA-C  atorvastatin (LIPITOR) 80 MG tablet Take 1 tablet (80 mg total) by mouth daily. 07/25/20   Sharlene Dory, PA-C  Colchicine (MITIGARE) 0.6 MG CAPS Take 0.6 mg by mouth 2 (two) times daily as needed for up to 3 days (For symptoms of gout). 07/25/20 07/28/20  Leary Roca, PA-C  ferrous fumarate-b12-vitamic C-folic acid (TRINSICON / FOLTRIN) capsule Take 1 capsule by mouth 2 (two) times daily after a meal. 07/25/20   Sharlene Dory, PA-C  metoprolol tartrate (LOPRESSOR) 25 MG tablet Take 0.5 tablets (12.5 mg total) by mouth 2 (two) times daily. 07/25/20   Sharlene Dory, PA-C  Allergies    Patient has no known allergies.  Review of Systems   Review of Systems  Constitutional: Negative for chills and fever.  HENT: Negative for ear pain and sore throat.   Eyes: Negative for pain and visual disturbance.  Respiratory: Negative for cough and shortness of breath.   Cardiovascular: Positive for chest pain. Negative for palpitations.  Gastrointestinal: Negative for abdominal pain and vomiting.  Genitourinary: Negative for dysuria and hematuria.  Musculoskeletal: Positive for back pain. Negative for arthralgias.   Skin: Negative for color change and rash.  Neurological: Negative for seizures and syncope.  All other systems reviewed and are negative.   Physical Exam Updated Vital Signs BP (!) 126/59   Pulse 81   Temp 97.7 F (36.5 C) (Oral)   Resp 13   SpO2 99%   Physical Exam Vitals and nursing note reviewed.  Constitutional:      General: She is not in acute distress.    Appearance: She is well-developed and well-nourished.  HENT:     Head: Normocephalic and atraumatic.  Eyes:     Conjunctiva/sclera: Conjunctivae normal.  Cardiovascular:     Rate and Rhythm: Normal rate and regular rhythm.     Heart sounds: No murmur heard.   Pulmonary:     Effort: Pulmonary effort is normal. No respiratory distress.     Breath sounds: Normal breath sounds.  Abdominal:     Palpations: Abdomen is soft.     Tenderness: There is no abdominal tenderness.  Musculoskeletal:        General: No edema.     Cervical back: Neck supple.  Skin:    General: Skin is warm and dry.  Neurological:     Mental Status: She is alert.  Psychiatric:        Mood and Affect: Mood and affect normal.     ED Results / Procedures / Treatments   Labs (all labs ordered are listed, but only abnormal results are displayed) Labs Reviewed  BASIC METABOLIC PANEL - Abnormal; Notable for the following components:      Result Value   CO2 19 (*)    Glucose, Bld 131 (*)    All other components within normal limits  TROPONIN I (HIGH SENSITIVITY) - Abnormal; Notable for the following components:   Troponin I (High Sensitivity) 31 (*)    All other components within normal limits  TROPONIN I (HIGH SENSITIVITY) - Abnormal; Notable for the following components:   Troponin I (High Sensitivity) 101 (*)    All other components within normal limits  RESP PANEL BY RT-PCR (FLU A&B, COVID) [id number]  CBC    EKG EKG Interpretation  Date/Time:  Sunday August 07 2020 19:00:32 EST Ventricular Rate:  79 PR Interval:  142 QRS  Duration: 87 QT Interval:  404 QTC Calculation: 464 R Axis:   100 Text Interpretation: Sinus rhythm Anteroseptal infarct, age indeterminate Lateral leads are also involved Confirmed by Marianna Fuss ([id number]) on 08/07/2020 8:13:48 PM   Radiology No results found.  Procedures .Critical Care Performed by: Milagros Loll, MD Authorized by: Milagros Loll, MD   Critical care provider statement:    Critical care time (minutes):  43   Critical care was necessary to treat or prevent imminent or life-threatening deterioration of the following conditions:  Cardiac failure   Critical care was time spent personally by me on the following activities:  Discussions with consultants, evaluation of patient's response to treatment, examination of patient, ordering and performing treatments  and interventions, ordering and review of laboratory studies, ordering and review of radiographic studies, pulse oximetry, re-evaluation of patient's condition, obtaining history from patient or surrogate and review of old charts   (including critical care time)  Medications Ordered in ED Medications - No data to display  ED Course  I have reviewed the triage vital signs and the nursing notes.  Pertinent labs & imaging results that were available during my care of the patient were reviewed by me and considered in my medical decision making (see chart for details).    MDM Rules/Calculators/A&P                          75 year old lady presents to ER with concern for episode of chest/back pain.  History of coronary artery disease, recently underwent coronary artery bypass surgery on 12/13.  On my evaluation, patient was well-appearing and is currently pain-free.  No acute ST elevation MI on my review of EKG.  Her initial troponin was mildly elevated but repeat troponin was more elevated.  Raises concern for possible acute coronary syndrome, NSTEMI.  Reviewed with cardiology.  They will admit, recommended  starting heparin drip for now.  Final Clinical Impression(s) / ED Diagnoses Final diagnoses:  NSTEMI (non-ST elevated myocardial infarction) [facility])    Rx / DC Orders ED Discharge Orders    None       Lucrezia Starch, MD 08/07/20 2018

## 2020-08-07 NOTE — H&P (Signed)
CARDIOLOGY ADMISSION NOTE  Patient ID: Brenda Wilkins MRN: [id number] DOB/AGE: Aug 14, 1945 75 y.o.  Admit date: 08/07/2020 Primary Physician   None Primary Cardiologist  None Chief Complaint    Chest Pain  HPI:   Ms. Welcome is a 75 year old female with CAD s/p recent 3V-CABG on 07/18/20 (SVG to LAD, sequential SVG to Diag and OM) and HLD who presents to the ED with acute onset chest pain and was found to have an NSTEMI (troponin 31 > 101).   Patient was discharged on 07/25/20 from her CABG and reports feeling overall well since then. She had been slowly regaining her strength and ambulating for 15 min at a time twice per day without issue. On the day of her admission, she reports developing sudden onset back pain with radiation to her chest around 1500. She thought that it was perhaps a muscle spasm and so went for her 15 minute walk, but all the while her pain persisted. She rates the pain as 4-5/10 in severity. There were no clear exacerbating or alleviating factors that she noticed. Aside from a strong pulsing sensation in her head, she denies any other associated symptoms.  Given her continued discomfort, she had her son drive her to the ED for further evaluation. Patient was hemodynamically stable and afebrile on arrival. Labs were notable for uptrending troponin elevation. At the time of interview in the ED, patient states that she is comfortable and denies any active chest pain, exertional chest pressure/discomfort, dyspnea/tachypnea, paroxysmal nocturnal dyspnea/orthopnea, irregular heart beat/palpitations, presyncope/syncope, or lower extremity edema.     Past Medical History:  Diagnosis Date  . Hyperlipidemia   . NSTEMI (non-ST elevated myocardial infarction) (HCC) 07/16/2020  . 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 CORONARY ARTERY BYPASS GRAFT N/A 07/18/2020   Procedure: CORONARY ARTERY BYPASS GRAFTING (CABG), ON PUMP, TIMES THREE, USING  ENDOSCOPICALLY HARVESTED RIGHT GREATER SAPHENOUS VEIN;  Surgeon: Alleen Borne, MD;  Location: [facility] OR;  Service: Open Heart Surgery;  Laterality: N/A;  . CORONARY/GRAFT ACUTE MI REVASCULARIZATION N/A 07/18/2020   Procedure: Coronary/Graft Acute MI Revascularization;  Surgeon: Swaz[place], Peter M, MD;  Location: [facility] INVASIVE CV LAB;  Service: Cardiovascular;  Laterality: N/A;  . LEFT HEART CATH AND CORONARY ANGIOGRAPHY N/A 07/18/2020   Procedure: LEFT HEART CATH AND CORONARY ANGIOGRAPHY;  Surgeon: Tonny Bollman, MD;  Location: [facility] INVASIVE CV LAB;  Service: Cardiovascular;  Laterality: N/A;  . TEE WITHOUT CARDIOVERSION N/A 07/18/2020   Procedure: TRANSESOPHAGEAL ECHOCARDIOGRAM (TEE);  Surgeon: Alleen Borne, MD;  Location: [facility] OR;  Service: Open Heart Surgery;  Laterality: N/A;    No Known Allergies No current facility-administered medications on file prior to encounter.   Current Outpatient Medications on File Prior to Encounter  Medication Sig Dispense Refill  . acetaminophen (TYLENOL) 325 MG tablet Take 325-650 mg by mouth every 6 (six) hours as needed for mild pain (or headaches).    Marland Kitchen aspirin EC 325 MG EC tablet Take 1 tablet (325 mg total) by mouth daily.    Marland Kitchen atorvastatin (LIPITOR) 80 MG tablet Take 1 tablet (80 mg total) by mouth daily. 30 tablet 1  . Colchicine (MITIGARE) 0.6 MG CAPS Take 0.6 mg by mouth 2 (two) times daily as needed for up to 3 days (For symptoms of gout). 6 capsule 0  . ferrous fumarate-b12-vitamic C-folic acid (TRINSICON / FOLTRIN) capsule Take 1 capsule by mouth 2 (two) times daily after a meal. 60 capsule 1  .  metoprolol tartrate (LOPRESSOR) 25 MG tablet Take 0.5 tablets (12.5 mg total) by mouth 2 (two) times daily. 30 tablet 1   Social History   Socioeconomic History  . Marital status: Unknown    Spouse name: Not on file  . Number of children: Not on file  . Years of education: Not on file  . Highest education level: Not on file  Occupational History  . Not  on file  Tobacco Use  . Smoking status: Never Smoker  . Smokeless tobacco: Never Used  Substance and Sexual Activity  . Alcohol use: Never  . Drug use: Never  . Sexual activity: Not on file  Other Topics Concern  . Not on file  Social History Narrative  . Not on file   Social Determinants of Health   Financial Resource Strain: Not on file  Food Insecurity: Not on file  Transportation Needs: Not on file  Physical Activity: Not on file  Stress: Not on file  Social Connections: Not on file  Intimate Partner Violence: Not on file    No family history on file.   Review of Systems: [y] = yes, [ ]  = no       General: Weight gain [] ; Weight loss [ ] ; Anorexia [ ] ; Fatigue [ ] ; Fever [ ] ; Chills [ ] ; Weakness [ ]     Cardiac: as reported in HPI  Pulmonary: Cough [ ] ; Wheezing[ ] ; Hemoptysis[ ] ; Sputum [ ] ; Snoring [ ]     GI: Vomiting[ ] ; Dysphagia[ ] ; Melena[ ] ; Hematochezia [ ] ; Heartburn[ ] ; Abdominal pain [ ] ; Constipation [ ] ; Diarrhea [ ] ; BRBPR [ ]     GU: Hematuria[ ] ; Dysuria [ ] ; Nocturia[ ]   Vascular: Pain in legs with walking [ ] ; Pain in feet with lying flat [ ] ; Non-healing sores [ ] ; Stroke [ ] ; TIA [ ] ; Slurred speech [ ] ;    Neuro: Headaches[ ] ; Vertigo[ ] ; Seizures[ ] ; Paresthesias[ ] ;Blurred vision [ ] ; Diplopia [ ] ; Vision changes [ ]     Ortho/Skin: Arthritis [ ] ; Joint pain [ ] ; Muscle pain [ ] ; Joint swelling [ ] ; Back Pain [ ] ; Rash [ ]     Psych: Depression[ ] ; Anxiety[ ]     Heme: Bleeding problems [ ] ; Clotting disorders [ ] ; Anemia [ ]     Endocrine: Diabetes [ ] ; Thyroid dysfunction[ ]   Physical Exam: Blood pressure (!) 138/58, pulse 81, temperature 97.7 F (36.5 C), temperature source Oral, resp. rate 17, height 5' (1.524 m), weight 69.9 kg, SpO2 97 %.   GENERAL: Patient is afebrile, Vital signs reviewed, Well appearing, Patient appears comfortable, Alert and lucid. EYES: Normal inspection. HEENT:  normocephalic, atraumatic , normal ENT  inspection. ORAL:  Moist NECK:  supple , normal inspection. CARD:  regular rate and rhythm, heart sounds normal. RESP:  no respiratory distress, breath sounds normal. ABD: soft, nontender to palpation, nondistended . BACK: non-tender. No CVA tenderness. MUSC:  normal ROM, non-tender , no pedal edema . SKIN: color normal, no rash, warm, dry . NEURO: awake & alert, lucid, no motor/sensory deficit. Gait stable. PSYCH: mood/affect normal.   Labs: Lab Results  Component Value Date   BUN 18 08/07/2020   Lab Results  Component Value Date   CREATININE 0.83 08/07/2020   Lab Results  Component Value Date   NA 139 08/07/2020   K 4.2 08/07/2020   CL 109 08/07/2020   CO2 19 (L) 08/07/2020   No results found for: TROPONINI Lab Results  Component Value Date  WBC 9.1 08/07/2020   HGB 12.0 08/07/2020   HCT 39.5 08/07/2020   MCV 93.8 08/07/2020   PLT 400 08/07/2020   Lab Results  Component Value Date   CHOL 254 (H) 07/17/2020   HDL 40 (L) 07/17/2020   LDLCALC 183 (H) 07/17/2020   TRIG 153 (H) 07/17/2020   CHOLHDL 6.4 07/17/2020   Lab Results  Component Value Date   ALT 30 07/16/2020   AST 30 07/16/2020   ALKPHOS 117 07/16/2020   BILITOT 0.7 07/16/2020      Radiology:  CT-PE pending  EKG:  NSR, stable Q waves in the anteroseptal leads, no dynamic ST-T changes  ASSESSMENT AND PLAN:   Ms. Mccomber is a 75 year old female with CAD s/p recent 3V-CABG on 07/18/20 (SVG to LAD, sequential SVG to Diag and OM) and HLD who presents to the ED with acute onset chest pain and was found to have an NSTEMI (troponin 31 > 101).    # NSTEMI # HLD - Tentative plan for diagnostic LHC in AM  - Patient to be NPO except for meds - Start heparin IV infusion protocol - Symptom management with SLNTG - Trend trop and serial ECGs; monitor on telemetry - Continue aggressive risk factor modification   - ASA 325mg  daily per discharge recommendations  - Metoprolol 12.5mg  BID  - Atorvastatin 80mg    - Low risk group for PE (Wells score: 1.3% chance of PE in an ED population.)    Signed: 08/07/2020, 9:30 PM

## 2020-08-07 NOTE — ED Notes (Signed)
Cards provider called back and informed RN that he had cancelled test due to low risk and no need for additional radiation.  RN updated both pt and son.  They were satisfied w/ answer.    Pt placed on hospital bed for comfort and son given a recliner to sleep in.

## 2020-08-07 NOTE — Progress Notes (Signed)
ANTICOAGULATION CONSULT NOTE  Pharmacy Consult for Heparin Indication: chest pain/ACS  No Known Allergies  Patient Measurements: Height: 5' (152.4 cm) Weight: 69.9 kg (154 lb 1.6 oz) IBW/kg (Calculated) : 45.5 Heparin Dosing Weight: 60.8 kg  Vital Signs: Temp: 97.7 F (36.5 C) (01/02 1712) Temp Source: Oral (01/02 1712) BP: 126/59 (01/02 2000) Pulse Rate: 81 (01/02 2000)  Labs: Recent Labs    08/07/20 1731 08/07/20 1918  HGB 12.0  --   HCT 39.5  --   PLT 400  --   CREATININE 0.83  --   TROPONINIHS 31* 101*    Estimated Creatinine Clearance: 53.5 mL/min (by C-G formula based on SCr of 0.83 mg/dL).   Medical History: Past Medical History:  Diagnosis Date  . Hyperlipidemia   . NSTEMI (non-ST elevated myocardial infarction) (HCC) 07/16/2020  . 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 aspirin  325 mg Oral Daily  . atorvastatin  80 mg Oral Daily  . [START ON 08/08/2020] ferrous fumarate-b12-vitamic C-folic acid  1 capsule Oral BID PC  . heparin  3,650 Units Intravenous Once  . metoprolol tartrate  12.5 mg Oral BID    Assessment: Patient is a 74 yomf that presents to the ED with CP. The patient was found to have elevated trops that are trending upward. Pharmacy has been asked to dose heparin at this time for ACS.  Goal of Therapy:  Heparin level 0.3-0.7 units/ml Monitor platelets by anticoagulation protocol: Yes   Plan:  - Heparin bolus 3600 units IV x 1 dose - Heparin drip @ 750  units/hr - Heparin level in ~ 8 hours  - Monitor patient for s/s of bleeding and CBC while on heparin   Joaquim Lai PharmD. BCPS  08/07/2020,8:21 PM

## 2020-08-07 NOTE — ED Notes (Signed)
Paged Cardiology at 10:37PM for RN Victorino Dike

## 2020-08-07 NOTE — ED Notes (Signed)
Pt brought back from CT w/o getting CT.  Tec stated that Cards provider called while pt was on table canceling test.  Pt has questions why.  RN requested that provider be paged.

## 2020-08-07 NOTE — ED Notes (Signed)
Paged [facility] (floor coverage) for RN Victorino Dike at 10:33 pm

## 2020-08-07 NOTE — ED Triage Notes (Signed)
Pt reports thoracic back pain since 2pm this afternoon that resolved.  Reports pain to L chest that started just PTA.  Denies SOB, nausea, and vomiting.  CABG 07/18/20.

## 2020-08-08 ENCOUNTER — Other Ambulatory Visit (HOSPITAL_COMMUNITY): Payer: Medicare HMO

## 2020-08-08 ENCOUNTER — Encounter (HOSPITAL_COMMUNITY)
Admission: EM | Disposition: A | Discharge status: Home / Self Care | Payer: Self-pay | Source: Home / Self Care | Attending: Cardiology

## 2020-08-08 ENCOUNTER — Encounter (HOSPITAL_COMMUNITY): Payer: Self-pay | Admitting: Internal Medicine

## 2020-08-08 ENCOUNTER — Inpatient Hospital Stay (HOSPITAL_COMMUNITY): Payer: Medicare HMO

## 2020-08-08 DIAGNOSIS — I214 Non-ST elevation (NSTEMI) myocardial infarction: Secondary | ICD-10-CM | POA: Diagnosis not present

## 2020-08-08 DIAGNOSIS — I25810 Atherosclerosis of coronary artery bypass graft(s) without angina pectoris: Secondary | ICD-10-CM | POA: Diagnosis not present

## 2020-08-08 DIAGNOSIS — I2510 Atherosclerotic heart disease of native coronary artery without angina pectoris: Secondary | ICD-10-CM

## 2020-08-08 HISTORY — PX: CARDIAC CATHETERIZATION: SHX172

## 2020-08-08 HISTORY — PX: LEFT HEART CATH AND CORS/GRAFTS ANGIOGRAPHY: CATH118250

## 2020-08-08 LAB — ECHOCARDIOGRAM LIMITED
Area-P 1/2: 4.39 cm2
Calc EF: 68.8 %
Height: 60 in
S' Lateral: 2.7 cm
Single Plane A2C EF: 79.8 %
Single Plane A4C EF: 43 %
Weight: 2336 oz

## 2020-08-08 LAB — TROPONIN I (HIGH SENSITIVITY)
Troponin I (High Sensitivity): 570 ng/L (ref <18)
Troponin I (High Sensitivity): 358 ng/L (ref <18)

## 2020-08-08 LAB — HEPARIN LEVEL (UNFRACTIONATED): Heparin Unfractionated: <0.1 IU/mL — ABNORMAL LOW (ref 0.30–0.70)

## 2020-08-08 LAB — BASIC METABOLIC PANEL
Anion gap: 10 (ref 5–15)
BUN: 14 mg/dL (ref 8–23)
CO2: 23 mmol/L (ref 22–32)
Calcium: 8.8 mg/dL — ABNORMAL LOW (ref 8.9–10.3)
Chloride: 108 mmol/L (ref 98–111)
Creatinine, Ser: 0.83 mg/dL (ref 0.44–1.00)
GFR, Estimated: >60 mL/min (ref >60)
Glucose, Bld: 97 mg/dL (ref 70–99)
Potassium: 4.3 mmol/L (ref 3.5–5.1)
Sodium: 141 mmol/L (ref 135–145)

## 2020-08-08 SURGERY — LEFT HEART CATH AND CORS/GRAFTS ANGIOGRAPHY
Anesthesia: LOCAL

## 2020-08-08 MED ORDER — LIDOCAINE HCL (PF) 1 % IJ SOLN
INTRAMUSCULAR | Status: AC
  Filled 2020-08-08: qty 30

## 2020-08-08 MED ORDER — MIDAZOLAM HCL 2 MG/2ML IJ SOLN
INTRAMUSCULAR | Status: DC | PRN
  Administered 2020-08-08: 2 mg via INTRAVENOUS

## 2020-08-08 MED ORDER — ACETAMINOPHEN 325 MG PO TABS
ORAL_TABLET | ORAL | Status: AC
  Filled 2020-08-08: qty 2

## 2020-08-08 MED ORDER — ASPIRIN EC 81 MG PO TBEC
81.0000 mg | DELAYED_RELEASE_TABLET | Freq: Every day | ORAL | Status: DC
  Administered 2020-08-08 – 2020-08-09 (×2): 81 mg via ORAL
  Filled 2020-08-08 (×2): qty 1

## 2020-08-08 MED ORDER — ACETAMINOPHEN 325 MG PO TABS
650.0000 mg | ORAL_TABLET | ORAL | Status: DC | PRN
  Administered 2020-08-08: 650 mg via ORAL

## 2020-08-08 MED ORDER — SODIUM CHLORIDE 0.9% FLUSH: 3.0000 mL | Freq: Two times a day (BID) | INTRAVENOUS | Status: DC

## 2020-08-08 MED ORDER — IOHEXOL 350 MG/ML SOLN
INTRAVENOUS | Status: AC
  Filled 2020-08-08: qty 1

## 2020-08-08 MED ORDER — LABETALOL HCL 5 MG/ML IV SOLN: 10.0000 mg | INTRAVENOUS | Status: AC | PRN

## 2020-08-08 MED ORDER — VERAPAMIL HCL 2.5 MG/ML IV SOLN
INTRAVENOUS | Status: AC
  Filled 2020-08-08: qty 2

## 2020-08-08 MED ORDER — IOHEXOL 350 MG/ML SOLN
INTRAVENOUS | Status: DC | PRN
  Administered 2020-08-08: 70 mL

## 2020-08-08 MED ORDER — HEPARIN (PORCINE) IN NACL 1000-0.9 UT/500ML-% IV SOLN
INTRAVENOUS | Status: AC
  Filled 2020-08-08: qty 1000

## 2020-08-08 MED ORDER — SODIUM CHLORIDE 0.9 % IV SOLN: 250.0000 mL | INTRAVENOUS | Status: DC | PRN

## 2020-08-08 MED ORDER — ISOSORBIDE MONONITRATE ER 30 MG PO TB24
30.0000 mg | ORAL_TABLET | Freq: Every day | ORAL | Status: DC
  Administered 2020-08-08 – 2020-08-09 (×2): 30 mg via ORAL
  Filled 2020-08-08 (×2): qty 1

## 2020-08-08 MED ORDER — ENOXAPARIN SODIUM 40 MG/0.4ML ~~LOC~~ SOLN: 40.0000 mg | SUBCUTANEOUS | Status: DC

## 2020-08-08 MED ORDER — SODIUM CHLORIDE 0.9 % IV SOLN: INTRAVENOUS | Status: DC

## 2020-08-08 MED ORDER — SODIUM CHLORIDE 0.9% FLUSH: 3.0000 mL | INTRAVENOUS | Status: DC | PRN

## 2020-08-08 MED ORDER — LIDOCAINE HCL (PF) 1 % IJ SOLN
INTRAMUSCULAR | Status: DC | PRN
  Administered 2020-08-08: 2 mL

## 2020-08-08 MED ORDER — NITROGLYCERIN 1 MG/10 ML FOR IR/CATH LAB
INTRA_ARTERIAL | Status: AC
  Filled 2020-08-08: qty 10

## 2020-08-08 MED ORDER — FENTANYL CITRATE (PF) 100 MCG/2ML IJ SOLN
INTRAMUSCULAR | Status: DC | PRN
  Administered 2020-08-08: 25 ug via INTRAVENOUS

## 2020-08-08 MED ORDER — NITROGLYCERIN 1 MG/10 ML FOR IR/CATH LAB
INTRA_ARTERIAL | Status: DC | PRN
  Administered 2020-08-08: 200 ug

## 2020-08-08 MED ORDER — SODIUM CHLORIDE 0.9% FLUSH
3.0000 mL | Freq: Two times a day (BID) | INTRAVENOUS | Status: DC
  Administered 2020-08-09: 3 mL via INTRAVENOUS

## 2020-08-08 MED ORDER — HEPARIN SODIUM (PORCINE) 1000 UNIT/ML IJ SOLN
INTRAMUSCULAR | Status: AC
  Filled 2020-08-08: qty 1

## 2020-08-08 MED ORDER — DIAZEPAM 2 MG PO TABS: 5.0000 mg | ORAL_TABLET | Freq: Four times a day (QID) | ORAL | Status: DC | PRN

## 2020-08-08 MED ORDER — MIDAZOLAM HCL 2 MG/2ML IJ SOLN
INTRAMUSCULAR | Status: AC
  Filled 2020-08-08: qty 2

## 2020-08-08 MED ORDER — ONDANSETRON HCL 4 MG/2ML IJ SOLN
4.0000 mg | Freq: Four times a day (QID) | INTRAMUSCULAR | Status: DC | PRN
  Administered 2020-08-08: 4 mg via INTRAVENOUS
  Filled 2020-08-08: qty 2

## 2020-08-08 MED ORDER — HEPARIN BOLUS VIA INFUSION
2000.0000 [IU] | Freq: Once | INTRAVENOUS | Status: AC
  Administered 2020-08-08: 2000 [IU] via INTRAVENOUS
  Filled 2020-08-08: qty 2000

## 2020-08-08 MED ORDER — HYDRALAZINE HCL 20 MG/ML IJ SOLN: 10.0000 mg | INTRAMUSCULAR | Status: AC | PRN

## 2020-08-08 MED ORDER — ATORVASTATIN CALCIUM 80 MG PO TABS: 80.0000 mg | ORAL_TABLET | Freq: Every day | ORAL | Status: DC

## 2020-08-08 MED ORDER — HEPARIN SODIUM (PORCINE) 1000 UNIT/ML IJ SOLN
INTRAMUSCULAR | Status: DC | PRN
  Administered 2020-08-08: 3500 [IU] via INTRAVENOUS

## 2020-08-08 MED ORDER — PERFLUTREN LIPID MICROSPHERE
1.0000 mL | INTRAVENOUS | Status: AC | PRN
Start: 2020-08-08 — End: 2020-08-08
  Administered 2020-08-08: 2 mL via INTRAVENOUS
  Filled 2020-08-08: qty 10

## 2020-08-08 MED ORDER — VERAPAMIL HCL 2.5 MG/ML IV SOLN
INTRAVENOUS | Status: DC | PRN
  Administered 2020-08-08: 10 mL via INTRA_ARTERIAL

## 2020-08-08 MED ORDER — FENTANYL CITRATE (PF) 100 MCG/2ML IJ SOLN
INTRAMUSCULAR | Status: AC
  Filled 2020-08-08: qty 2

## 2020-08-08 MED ORDER — HEPARIN (PORCINE) IN NACL 1000-0.9 UT/500ML-% IV SOLN
INTRAVENOUS | Status: DC | PRN
  Administered 2020-08-08 (×2): 500 mL

## 2020-08-08 MED ORDER — ASPIRIN 81 MG PO CHEW: 81.0000 mg | CHEWABLE_TABLET | Freq: Every day | ORAL | Status: DC

## 2020-08-08 MED ORDER — TICAGRELOR 90 MG PO TABS
180.0000 mg | ORAL_TABLET | Freq: Once | ORAL | Status: AC
  Administered 2020-08-08: 180 mg via ORAL
  Filled 2020-08-08: qty 2

## 2020-08-08 MED ORDER — TICAGRELOR 90 MG PO TABS
90.0000 mg | ORAL_TABLET | Freq: Two times a day (BID) | ORAL | Status: DC
  Administered 2020-08-08 – 2020-08-09 (×2): 90 mg via ORAL
  Filled 2020-08-08 (×2): qty 1

## 2020-08-08 SURGICAL SUPPLY — 10 items
CATH INFINITI JR4 5F (CATHETERS) ×2 IMPLANT
CATH OPTITORQUE TIG 4.0 5F (CATHETERS) ×2 IMPLANT
GLIDESHEATH SLEND SS 6F .021 (SHEATH) ×2 IMPLANT
GUIDEWIRE INQWIRE 1.5J.035X260 (WIRE) ×1 IMPLANT
INQWIRE 1.5J .035X260CM (WIRE) ×2
KIT HEART LEFT (KITS) ×2 IMPLANT
PACK CARDIAC CATHETERIZATION (CUSTOM PROCEDURE TRAY) ×2 IMPLANT
SHEATH PROBE COVER 6X72 (BAG) ×2 IMPLANT
TRANSDUCER W/STOPCOCK (MISCELLANEOUS) ×2 IMPLANT
TUBING CIL FLEX 10 FLL-RA (TUBING) ×2 IMPLANT

## 2020-08-08 NOTE — ED Notes (Signed)
To cath lab NOW 

## 2020-08-08 NOTE — Progress Notes (Addendum)
 Progress Note  Patient Name: Brenda Wilkins Date of Encounter: 08/08/2020  [facility] Cardiologist: No primary care provider on file.   Subjective   Brief HPI: 74 year old female with history of recent NSTEMI in early December found to have multivessel CAD on cath 07/17/20 s/p 3V CABG on 07/18/20 (SVG to LAD, sequential SVG to Diag and OM) and HLD who re-presented to ED with acute onset substernal chest pain found to have elevated troponin consistent with NSTEMI. Now awaiting LHC.   Subjective: Chest pain free this morning. Continues on heparin gtt. Trop 31-->101-->570 Plan for LHC today  Inpatient Medications    Scheduled Meds: . aspirin  325 mg Oral Daily  . atorvastatin  80 mg Oral Daily  . ferrous fumarate-b12-vitamic C-folic acid  1 capsule Oral BID PC  . metoprolol tartrate  12.5 mg Oral BID   Continuous Infusions: . heparin 950 Units/hr (08/08/20 0643)   PRN Meds: acetaminophen, iohexol, nitroGLYCERIN, ondansetron (ZOFRAN) IV   Vital Signs    Vitals:   08/08/20 0540 08/08/20 0549 08/08/20 0700 08/08/20 0725  BP: 128/65  (!) 110/57   Pulse: 73  74   Resp: 18  14   Temp:  98.6 F (37 C)  98 F (36.7 C)  TempSrc:    Oral  SpO2: 97%  95%   Weight:      Height:       No intake or output data in the 24 hours ending 08/08/20 0732 Last 3 Weights 08/07/2020 08/07/2020 07/25/2020  Weight (lbs) 146 lb 154 lb 1.6 oz 154 lb 1.6 oz  Weight (kg) 66.225 kg 69.9 kg 69.9 kg      Telemetry    NSR - Personally Reviewed  ECG    NSR with q waves in V1-V3 and new TWI in I and aVL - Personally Reviewed  Physical Exam   GEN: No acute distress.   Neck: No JVD Cardiac: RRR, no murmurs, rubs, or gallops.  Respiratory: Clear to auscultation bilaterally. GI: Soft, nontender, non-distended  MS: No edema; No deformity. Neuro:  Nonfocal  Psych: Normal affect   Labs    High Sensitivity Troponin:   Recent Labs  Lab 07/17/20 1326 07/18/20 0235 08/07/20 1731  08/07/20 1918 08/08/20 0547  TROPONINIHS 290* 174* 31* 101* 570*      Chemistry Recent Labs  Lab 08/07/20 1731 08/08/20 0547  NA 139 141  K 4.2 4.3  CL 109 108  CO2 19* 23  GLUCOSE 131* 97  BUN 18 14  CREATININE 0.83 0.83  CALCIUM 9.0 8.8*  GFRNONAA >60 >60  ANIONGAP 11 10     Hematology Recent Labs  Lab 08/07/20 1731  WBC 9.1  RBC 4.21  HGB 12.0  HCT 39.5  MCV 93.8  MCH 28.5  MCHC 30.4  RDW 13.9  PLT 400    BNPNo results for input(s): BNP, PROBNP in the last 168 hours.   DDimer No results for input(s): DDIMER in the last 168 hours.   Radiology    No results found.  Cardiac Studies   Cath 07/18/20:   1st Diag lesion is 90% stenosed.  Prox Cx lesion is 75% stenosed.  Prox LAD-1 lesion is 100% stenosed.  Prox LAD-2 lesion is 95% stenosed with 95% stenosed side branch in 1st Diag.  Post intervention, there is a 95% residual stenosis.  Post intervention, there is a 95% residual stenosis.  Post intervention, the side branch was reduced to 95% residual stenosis.   1. Acute   occlusion of the proximal LAD at site of very complex bifurcation lesion. Successful restoration of antegrade flow with POBA into the first diagonal branch. Unable to cross the lesion in the LAD distal to the diagonal with a wire.   Plan: emergent CABG.   Diagnostic Dominance: Right    Intervention      TTE 07/17/20: IMPRESSIONS  1. Left ventricular ejection fraction, by estimation, is 55 to 60%. The  left ventricle has normal function. The left ventricle has no regional  wall motion abnormalities. There is moderate asymmetric left ventricular  hypertrophy. Left ventricular  diastolic parameters are consistent with Grade I diastolic dysfunction  (impaired relaxation).  2. Right ventricular systolic function is normal. The right ventricular  size is normal.  3. The mitral valve is grossly normal. No evidence of mitral valve  regurgitation.  4. The aortic  valve was not well visualized. Aortic valve regurgitation  is not visualized. No aortic stenosis is present.   Comparison(s): No prior Echocardiogram.   Conclusion(s)/Recommendation(s): Normal biventricular function without  evidence of hemodynamically significant valvular heart disease.   FINDINGS  Left Ventricle: Left ventricular ejection fraction, by estimation, is 55  to 60%. The left ventricle has normal function. The left ventricle has no  regional wall motion abnormalities. The left ventricular internal cavity  size was small. There is moderate  asymmetric left ventricular hypertrophy. Left ventricular diastolic  parameters are consistent with Grade I diastolic dysfunction (impaired  relaxation).   Right Ventricle: The right ventricular size is normal. No increase in  right ventricular wall thickness. Right ventricular systolic function is  normal.   Left Atrium: Left atrial size was normal in size.   Right Atrium: Right atrial size was normal in size.   Pericardium: There is no evidence of pericardial effusion.   Mitral Valve: The mitral valve is grossly normal. No evidence of mitral  valve regurgitation.   Tricuspid Valve: The tricuspid valve is grossly normal. Tricuspid valve  regurgitation is not demonstrated.   Aortic Valve: The aortic valve was not well visualized. Aortic valve  regurgitation is not visualized. No aortic stenosis is present.   Pulmonic Valve: The pulmonic valve was grossly normal. Pulmonic valve  regurgitation is not visualized.   Aorta: The aortic root and ascending aorta are structurally normal, with  no evidence of dilitation.   IAS/Shunts: The atrial septum is grossly normal.   07/18/20: CABG Op note: Procedure:  1. Emergent Median Sternotomy 2. Extracorporeal circulation 3.   Coronary artery bypass grafting x 3   Saphenous vein graft to the LAD  Sequential SVG to diagonal and OM.   4.   Endoscopic vein harvest from the  right leg  Patient Profile     75 y.o. 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 Now awaiting LHC.   Assessment & Plan    #NSTEMI #Multivessel CAD s/p CABG on 07/18/20 with SVG to LAD, sequential SVG to Diag and OM Patient with recent admission for NSTEMI found to have multivessel CAD on cath on 07/17/20 prompting emergent CABG on 07/18/20 as detailed above. LIMA deemed not a suitable bypass conduit due to small size. TTE on 07/17/20 with preserved LVEF 55-60%, no WMA, normal RV function. Post-operatively, she required inotropic support  initially but was weaned off and was able to go home on 07/25/20. She was doing well until yesterday when she developed acute onset back pain radiating to her chest. She presented to the ED where trop up-trended from 30-->500 and ECG with new TWI in the lateral leads (chronic q waves in V1-V3). She was started on heparin gtt with plans for [facility] today. Chest/back pain free this morning. -Plan for LHC today; patient currently NPO -Continue heparin gtt -Change to ASA 81mg  daily -Will need P2Y12 inhibitor post-cath given history of NSTEMI on prior presentation. Will need DAPT for 1 year for ACS treatment -Continue atorvastatin 80mg  daily -Continue metop 12.5mg  BID---change to XL prior to discharge -Add ACE/ARB prior to discharge pending BP -Repeat limited TTE to assess LVEF and wall motion  INFORMED CONSENT: I have reviewed the risks, indications, and alternatives to cardiac catheterization, possible angioplasty, and stenting with the patient. Risks include but are not limited to bleeding, infection, vascular injury, stroke, myocardial infection, arrhythmia, kidney injury, radiation-related injury in the case of  prolonged fluoroscopy use, emergency cardiac surgery, and death. The patient understands the risks of serious complication is 1-2 in 1000 with diagnostic cardiac cath and 1-2% or less with angioplasty/stenting.    For questions or updates, please contact [facility] Please consult [email or web address] for contact info under        Signed, , MD  08/08/2020, 7:32 AM

## 2020-08-08 NOTE — Interval H&P Note (Signed)
Cath Lab Visit (complete for each Cath Lab visit)  Clinical Evaluation Leading to the Procedure:   ACS: Yes.    Non-ACS:    Anginal Classification: CCS IV  Anti-ischemic medical therapy: Minimal Therapy (1 class of medications)  Non-Invasive Test Results: No non-invasive testing performed  Prior CABG: Previous CABG      History and Physical Interval Note:  08/08/2020 11:07 AM  Brenda Wilkins  has presented today for surgery, with the diagnosis of chest pain.  The various methods of treatment have been discussed with the patient and family. After consideration of risks, benefits and other options for treatment, the patient has consented to  Procedure(s): LEFT HEART CATH AND CORS/GRAFTS ANGIOGRAPHY (N/A) as a surgical intervention.  The patient's history has been reviewed, patient examined, no change in status, stable for surgery.  I have reviewed the patient's chart and labs.  Questions were answered to the patient's satisfaction.     Nicki Guadalajara

## 2020-08-08 NOTE — Progress Notes (Signed)
ANTICOAGULATION CONSULT NOTE Pharmacy Consult for Heparin Indication: chest pain/ACS  No Known Allergies  Patient Measurements: Height: 5' (152.4 cm) Weight: 66.2 kg (146 lb) IBW/kg (Calculated) : 45.5 Heparin Dosing Weight: 60.8 kg  Vital Signs: Temp: 98.6 F (37 C) (01/03 0549) BP: 128/65 (01/03 0540) Pulse Rate: 73 (01/03 0540)  Labs: Recent Labs    08/07/20 1731 08/07/20 1918 08/08/20 0547  HGB 12.0  --   --   HCT 39.5  --   --   PLT 400  --   --   HEPARINUNFRC  --   --  <0.10*  CREATININE 0.83  --   --   TROPONINIHS 31* 101*  --     Estimated Creatinine Clearance: 52 mL/min (by C-G formula based on SCr of 0.83 mg/dL).  Assessment: 75 y.o. female with chest pain for heparin  Goal of Therapy:  Heparin level 0.3-0.7 units/ml Monitor platelets by anticoagulation protocol: Yes   Plan:  Heparin 2000 units IV bolus, then increase heparin 950 units/hr Check heparin level in 8 hours.  Lakaisha Danish, Gary Fleet PharmD. BCPS  08/08/2020,6:32 AM

## 2020-08-08 NOTE — Progress Notes (Signed)
  Echocardiogram 2D Echocardiogram limited with definity has been performed.  Leta Jungling M 08/08/2020, 1:06 PM

## 2020-08-08 NOTE — H&P (View-Only) (Signed)
Progress Note  Patient Name: Brenda Wilkins Date of Encounter: 08/08/2020  [facility] Cardiologist: No primary care provider on file.   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 Now awaiting LHC.   Subjective: Chest pain free this morning. Continues on heparin gtt. Trop 31-->101-->570 Plan for [facility] today  Inpatient Medications    Scheduled Meds: . aspirin  325 mg Oral Daily  . atorvastatin  80 mg Oral Daily  . ferrous [id number] C-folic acid  1 capsule Oral BID PC  . metoprolol tartrate  12.5 mg Oral BID   Continuous Infusions: . heparin 950 Units/hr (08/08/20 0643)   PRN Meds: acetaminophen, iohexol, nitroGLYCERIN, ondansetron (ZOFRAN) IV   Vital Signs    Vitals:   08/08/20 0540 08/08/20 0549 08/08/20 0700 08/08/20 0725  BP: 128/65  (!) 110/57   Pulse: 73  74   Resp: 18  14   Temp:  98.6 F (37 C)  98 F (36.7 C)  TempSrc:    Oral  SpO2: 97%  95%   Weight:      Height:       No intake or output data in the 24 hours ending 08/08/20 0732 Last 3 Weights 08/07/2020 08/07/2020 07/25/2020  Weight (lbs) 146 lb 154 lb 1.6 oz 154 lb 1.6 oz  Weight (kg) 66.225 kg 69.9 kg 69.9 kg      Telemetry    NSR - Personally Reviewed  ECG    NSR with q waves in V1-V3 and new TWI in I and aVL - Personally Reviewed  Physical Exam   GEN: No acute distress.   Neck: No JVD Cardiac: RRR, no murmurs, rubs, or gallops.  Respiratory: Clear to auscultation bilaterally. GI: Soft, nontender, non-distended  MS: No edema; No deformity. Neuro:  Nonfocal  Psych: Normal affect   Labs    High Sensitivity Troponin:   Recent Labs  Lab 07/17/20 1326 07/18/20 0235 08/07/20 1731  08/07/20 1918 08/08/20 0547  TROPONINIHS 290* 174* 31* 101* 570*      Chemistry Recent Labs  Lab 08/07/20 1731 08/08/20 0547  NA 139 141  K 4.2 4.3  CL 109 108  CO2 19* 23  GLUCOSE 131* 97  BUN 18 14  CREATININE 0.83 0.83  CALCIUM 9.0 8.8*  GFRNONAA >60 >60  ANIONGAP 11 10     Hematology Recent Labs  Lab 08/07/20 1731  WBC 9.1  RBC 4.21  HGB 12.0  HCT 39.5  MCV 93.8  MCH 28.5  MCHC 30.4  RDW 13.9  PLT 400    BNPNo results for input(s): BNP, PROBNP in the last 168 hours.   DDimer No results for input(s): DDIMER in the last 168 hours.   Radiology    No results found.  Cardiac Studies   Cath 07/18/20:   1st Diag lesion is 90% stenosed.  Prox Cx lesion is 75% stenosed.  Prox LAD-1 lesion is 100% stenosed.  Prox LAD-2 lesion is 95% stenosed with 95% stenosed side branch in 1st Diag.  Post intervention, there is a 95% residual stenosis.  Post intervention, there is a 95% residual stenosis.  Post intervention, the side branch was reduced to 95% residual stenosis.   1. Acute  occlusion of the proximal LAD at site of very complex bifurcation lesion. Successful restoration of antegrade flow with POBA into the first diagonal branch. Unable to cross the lesion in the LAD distal to the diagonal with a wire.   Plan: emergent CABG.   Diagnostic Dominance: Right    Intervention      TTE 07/17/20: IMPRESSIONS  1. Left ventricular ejection fraction, by estimation, is 55 to 60%. The  left ventricle has normal function. The left ventricle has no regional  wall motion abnormalities. There is moderate asymmetric left ventricular  hypertrophy. Left ventricular  diastolic parameters are consistent with Grade I diastolic dysfunction  (impaired relaxation).  2. Right ventricular systolic function is normal. The right ventricular  size is normal.  3. The mitral valve is grossly normal. No evidence of mitral valve  regurgitation.  4. The aortic  valve was not well visualized. Aortic valve regurgitation  is not visualized. No aortic stenosis is present.   Comparison(s): No prior Echocardiogram.   Conclusion(s)/Recommendation(s): Normal biventricular function without  evidence of hemodynamically significant valvular heart disease.   FINDINGS  Left Ventricle: Left ventricular ejection fraction, by estimation, is 55  to 60%. The left ventricle has normal function. The left ventricle has no  regional wall motion abnormalities. The left ventricular internal cavity  size was small. There is moderate  asymmetric left ventricular hypertrophy. Left ventricular diastolic  parameters are consistent with Grade I diastolic dysfunction (impaired  relaxation).   Right Ventricle: The right ventricular size is normal. No increase in  right ventricular wall thickness. Right ventricular systolic function is  normal.   Left Atrium: Left atrial size was normal in size.   Right Atrium: Right atrial size was normal in size.   Pericardium: There is no evidence of pericardial effusion.   Mitral Valve: The mitral valve is grossly normal. No evidence of mitral  valve regurgitation.   Tricuspid Valve: The tricuspid valve is grossly normal. Tricuspid valve  regurgitation is not demonstrated.   Aortic Valve: The aortic valve was not well visualized. Aortic valve  regurgitation is not visualized. No aortic stenosis is present.   Pulmonic Valve: The pulmonic valve was grossly normal. Pulmonic valve  regurgitation is not visualized.   Aorta: The aortic root and ascending aorta are structurally normal, with  no evidence of dilitation.   IAS/Shunts: The atrial septum is grossly normal.   07/18/20: CABG Op note: Procedure:  1. Emergent Median Sternotomy 2. Extracorporeal circulation 3.   Coronary artery bypass grafting x 3   Saphenous vein graft to the LAD  Sequential SVG to diagonal and OM.   4.   Endoscopic vein harvest from the  right leg  Patient Profile     75 y.o. 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 Now awaiting LHC.   Assessment & Plan    #NSTEMI #Multivessel CAD s/p CABG on 07/18/20 with SVG to LAD, sequential SVG to Diag and OM Patient with recent admission for NSTEMI found to have multivessel CAD on cath on 07/17/20 prompting emergent CABG on 07/18/20 as detailed above. LIMA deemed not a suitable bypass conduit due to small size. TTE on 07/17/20 with preserved LVEF 55-60%, no WMA, normal RV function. Post-operatively, she required inotropic support  initially but was weaned off and was able to go home on 07/25/20. She was doing well until yesterday when she developed acute onset back pain radiating to her chest. She presented to the ED where trop up-trended from 30-->500 and ECG with new TWI in the lateral leads (chronic q waves in V1-V3). She was started on heparin gtt with plans for [facility] today. Chest/back pain free this morning. -Plan for LHC today; patient currently NPO -Continue heparin gtt -Change to ASA 81mg  daily -Will need P2Y12 inhibitor post-cath given history of NSTEMI on prior presentation. Will need DAPT for 1 year for ACS treatment -Continue atorvastatin 80mg  daily -Continue metop 12.5mg  BID---change to XL prior to discharge -Add ACE/ARB prior to discharge pending BP -Repeat limited TTE to assess LVEF and wall motion  INFORMED CONSENT: I have reviewed the risks, indications, and alternatives to cardiac catheterization, possible angioplasty, and stenting with the patient. Risks include but are not limited to bleeding, infection, vascular injury, stroke, myocardial infection, arrhythmia, kidney injury, radiation-related injury in the case of  prolonged fluoroscopy use, emergency cardiac surgery, and death. The patient understands the risks of serious complication is 1-2 in 1000 with diagnostic cardiac cath and 1-2% or less with angioplasty/stenting.    For questions or updates, please contact [facility] Please consult [email or web address] for contact info under        Signed, , MD  08/08/2020, 7:32 AM

## 2020-08-09 ENCOUNTER — Other Ambulatory Visit (HOSPITAL_COMMUNITY): Payer: Self-pay | Admitting: Medical

## 2020-08-09 ENCOUNTER — Ambulatory Visit: Payer: Medicare HMO | Admitting: Internal Medicine

## 2020-08-09 DIAGNOSIS — I214 Non-ST elevation (NSTEMI) myocardial infarction: Secondary | ICD-10-CM | POA: Diagnosis not present

## 2020-08-09 DIAGNOSIS — Z951 Presence of aortocoronary bypass graft: Secondary | ICD-10-CM | POA: Diagnosis not present

## 2020-08-09 DIAGNOSIS — I2510 Atherosclerotic heart disease of native coronary artery without angina pectoris: Secondary | ICD-10-CM

## 2020-08-09 LAB — CBC
HCT: 32.8 % — ABNORMAL LOW (ref 36.0–46.0)
Hemoglobin: 10.5 g/dL — ABNORMAL LOW (ref 12.0–15.0)
MCH: 29.7 pg (ref 26.0–34.0)
MCHC: 32 g/dL (ref 30.0–36.0)
MCV: 92.9 fL (ref 80.0–100.0)
Platelets: 332 10*3/uL (ref 150–400)
RBC: 3.53 MIL/uL — ABNORMAL LOW (ref 3.87–5.11)
RDW: 14 % (ref 11.5–15.5)
WBC: 7.7 10*3/uL (ref 4.0–10.5)
nRBC: 0 % (ref 0.0–0.2)

## 2020-08-09 LAB — BASIC METABOLIC PANEL
Anion gap: 9 (ref 5–15)
BUN: 16 mg/dL (ref 8–23)
CO2: 23 mmol/L (ref 22–32)
Calcium: 8.6 mg/dL — ABNORMAL LOW (ref 8.9–10.3)
Chloride: 107 mmol/L (ref 98–111)
Creatinine, Ser: 0.9 mg/dL (ref 0.44–1.00)
GFR, Estimated: >60 mL/min (ref >60)
Glucose, Bld: 104 mg/dL — ABNORMAL HIGH (ref 70–99)
Potassium: 4.3 mmol/L (ref 3.5–5.1)
Sodium: 139 mmol/L (ref 135–145)

## 2020-08-09 MED ORDER — TICAGRELOR 90 MG PO TABS
90.0000 mg | ORAL_TABLET | Freq: Two times a day (BID) | ORAL | 0 refills | Status: DC
Start: 2020-08-09 — End: 2020-08-30

## 2020-08-09 MED ORDER — ISOSORBIDE MONONITRATE ER 30 MG PO TB24: 30.0000 mg | ORAL_TABLET | Freq: Every day | ORAL | 3 refills | Status: DC

## 2020-08-09 MED ORDER — NITROGLYCERIN 0.4 MG SL SUBL: 0.4000 mg | SUBLINGUAL_TABLET | SUBLINGUAL | 3 refills | Status: DC | PRN

## 2020-08-09 MED ORDER — TICAGRELOR 90 MG PO TABS: 90.0000 mg | ORAL_TABLET | Freq: Two times a day (BID) | ORAL | 3 refills | Status: DC

## 2020-08-09 MED ORDER — ASPIRIN 81 MG PO TBEC: 81.0000 mg | DELAYED_RELEASE_TABLET | Freq: Every day | ORAL | 0 refills | Status: DC

## 2020-08-09 MED ORDER — METOPROLOL SUCCINATE ER 25 MG PO TB24: 12.5000 mg | ORAL_TABLET | Freq: Every day | ORAL | 3 refills | Status: DC

## 2020-08-09 MED ORDER — METOPROLOL SUCCINATE ER 25 MG PO TB24
12.5000 mg | ORAL_TABLET | Freq: Every day | ORAL | Status: DC
  Administered 2020-08-09: 12.5 mg via ORAL
  Filled 2020-08-09: qty 1

## 2020-08-09 MED ORDER — ISOSORBIDE MONONITRATE ER 30 MG PO TB24: 30.0000 mg | ORAL_TABLET | Freq: Every day | ORAL | 0 refills | Status: DC

## 2020-08-09 MED ORDER — ASPIRIN 81 MG PO TBEC: 81.0000 mg | DELAYED_RELEASE_TABLET | Freq: Every day | ORAL | 3 refills | Status: DC

## 2020-08-09 MED ORDER — NITROGLYCERIN 0.4 MG SL SUBL: 0.4000 mg | SUBLINGUAL_TABLET | SUBLINGUAL | 0 refills | Status: DC | PRN

## 2020-08-09 MED ORDER — METOPROLOL SUCCINATE ER 25 MG PO TB24: 12.5000 mg | ORAL_TABLET | Freq: Every day | ORAL | 0 refills | Status: DC

## 2020-08-09 MED FILL — ISOSORBIDE MN ER 30 MG TAB: 30 | 30 days supply | Qty: 30 | Fill #0

## 2020-08-09 MED FILL — BRILINTA 90 MG TABLET: 90 | 30 days supply | Qty: 60 | Fill #0

## 2020-08-09 MED FILL — ASPIRIN LOW DOSE 81 MG TBEC: 81 | 30 days supply | Qty: 30 | Fill #0

## 2020-08-09 MED FILL — NITROGLYCERIN 0.4 MG TAB SL: 0.4 | 8 days supply | Qty: 25 | Fill #0

## 2020-08-09 MED FILL — METOPROLOL SUCCINATE ER 25: 25 | 30 days supply | Qty: 15 | Fill #0

## 2020-08-09 NOTE — Plan of Care (Signed)

## 2020-08-09 NOTE — Progress Notes (Signed)
   Prescriptions for 90 day supplies sent to primary pharmacy. Additional 30 day supply prescriptions sent to [facility] pharmacy for delivery to bedside.   Beatriz Stallion, PA-C 08/09/20; 4:11 PM

## 2020-08-09 NOTE — Discharge Instructions (Signed)
PLEASE REMEMBER TO BRING ALL OF YOUR MEDICATIONS TO EACH OF YOUR FOLLOW-UP OFFICE VISITS.  PLEASE ATTEND ALL SCHEDULED FOLLOW-UP APPOINTMENTS.   Activity: Increase activity slowly as tolerated. You may shower, but no soaking baths (or swimming) for 1 week. No driving for 24 hours. No lifting over 5 lbs for 1 week. No sexual activity for 1 week.   Wound Care: You may wash cath site gently with soap and water. Keep cath site clean and dry. If you notice pain, swelling, bleeding or pus at your cath site, please call [phone].   Medication changes: - STOP aspirin 325mg  daily and START a baby aspirin 81mg  daily - STOP metoprolol tartrate 12.5 mg twice daily and START metoprolol succinate 12.5mg  daily - START brilinta (ticagrelor) 90mg  twice daily which works to prevent platelets from sticking together and forming new blockages - START imdur (isosorbide mononitrate) 30mg  daily which works to dilate the blood vessels and minimize chest pains

## 2020-08-09 NOTE — Progress Notes (Signed)
CARDIAC REHAB PHASE I   PRE:  Rate/Rhythm: 87 SR    BP: sitting 99/52    SaO2:   MODE:  Ambulation: 400 ft   POST:  Rate/Rhythm: 108 ST    BP: sitting 139/58     SaO2:   Pt ambulated independently at slow pace. No angina or SOB, feels well, just slightly weak. Discussed MI, restrictions, Brilinta, diet, exercise, NTG and CRPII with pt and son. Very receptive, good questions. She had been progressing well with her diet and exercise PTA. Plans to attend CRPII [place] when ready. Will update referral.  [phone]   Harriet Masson CES, ACSM 08/09/2020 9:56 AM

## 2020-08-09 NOTE — Progress Notes (Signed)
Progress Note  Patient Name: Brenda Wilkins Date of Encounter: 08/09/2020  [facility] Cardiologist: No primary care provider on file.   Subjective  Patient feels well this morning. No chest pain or SOB. Radial access site c/d/i.  Cath showed occluded SVG-LAD graft but continued TIMI 3 flow to LAD from SVG-diag graft. Given concern for significant thrombus in occluded graft and good LAD flow from diag graft, Brenda decision was made to continue aggressive medical therapy.  TTE with LVEF 50-55% with abnormal septal motion.   Inpatient Medications    Scheduled Meds: . aspirin  81 mg Oral Daily  . atorvastatin  80 mg Oral Daily  . enoxaparin (LOVENOX) injection  40 mg Subcutaneous Q24H  . ferrous fumarate-b12-vitamic C-folic acid  1 capsule Oral BID PC  . isosorbide mononitrate  30 mg Oral Daily  . metoprolol tartrate  12.5 mg Oral BID  . sodium chloride flush  3 mL Intravenous Q12H  . sodium chloride flush  3 mL Intravenous Q12H  . ticagrelor  90 mg Oral BID   Continuous Infusions: . sodium chloride    . sodium chloride Stopped (08/09/20 0054)  . sodium chloride     PRN Meds: sodium chloride, sodium chloride, acetaminophen, diazepam, iohexol, nitroGLYCERIN, ondansetron (ZOFRAN) IV, sodium chloride flush, sodium chloride flush   Vital Signs    Vitals:   08/08/20 1658 08/08/20 2032 08/08/20 2146 08/08/20 2352  BP: (!) 99/55 (!) 114/58 109/60 99/60  Pulse: 75  89   Resp: 18 17  18   Temp: 98.7 F (37.1 C) 98.3 F (36.8 C)  97.8 F (36.6 C)  TempSrc: Oral Oral  Oral  SpO2:  98%  97%  Weight:      Height:        Intake/Output Summary (Last 24 hours) at 08/09/2020 0809 Last data filed at 08/08/2020 2359 Gross per 24 hour  Intake 1159.5 ml  Output --  Net 1159.5 ml   Last 3 Weights 08/07/2020 08/07/2020 07/25/2020  Weight (lbs) 146 lb 154 lb 1.6 oz 154 lb 1.6 oz  Weight (kg) 66.225 kg 69.9 kg 69.9 kg      Telemetry    NSR - Personally Reviewed  ECG    NSR with q  waves V1-V4 (unchanged) - Personally Reviewed  Physical Exam   GEN: No acute distress.   Neck: No JVD Cardiac: RRR, no murmurs, rubs, or gallops.  Respiratory: Clear to auscultation bilaterally. GI: Soft, nontender, non-distended  MS: No edema; right wrist access site c/d/i Neuro:  Nonfocal  Psych: Normal affect   Labs    High Sensitivity Troponin:   Recent Labs  Lab 07/18/20 0235 08/07/20 1731 08/07/20 1918 08/08/20 0547 08/08/20 1427  TROPONINIHS 174* 31* 101* 570* 358*      Chemistry Recent Labs  Lab 08/07/20 1731 08/08/20 0547 08/09/20 0333  NA 139 141 139  K 4.2 4.3 4.3  CL 109 108 107  CO2 19* 23 23  GLUCOSE 131* 97 104*  BUN 18 14 16   CREATININE 0.83 0.83 0.90  CALCIUM 9.0 8.8* 8.6*  GFRNONAA >60 >60 >60  ANIONGAP 11 10 9      Hematology Recent Labs  Lab 08/07/20 1731 08/09/20 0333  WBC 9.1 7.7  RBC 4.21 3.53*  HGB 12.0 10.5*  HCT 39.5 32.8*  MCV 93.8 92.9  MCH 28.5 29.7  MCHC 30.4 32.0  RDW 13.9 14.0  PLT 400 332    BNPNo results for input(s): BNP, PROBNP in Brenda last 168 hours.  DDimer No results for input(s): DDIMER in Brenda last 168 hours.   Radiology    CARDIAC CATHETERIZATION  Result Date: 08/08/2020  Prox Cx lesion is 80% stenosed.  Prox LAD to Mid LAD lesion is 100% stenosed.  Mid LAD lesion is 80% stenosed.  Origin to Prox Graft lesion is 100% stenosed.  Ost RCA lesion is 40% stenosed.  There is mild to moderate left ventricular systolic dysfunction.  LV end diastolic pressure is mildly elevated.  Mild LV dysfunction with mild anterolateral hypocontractility, EF estimate at 40% with LVEDP at 20 mmHg. Significant native CAD with total occlusion of Brenda LAD immediately proximal to Brenda takeoff of Brenda diagonal vessel. Normal bifurcating ramus intermediate vessel. Large left circumflex coronary artery with 80% proximal stenosis and competitive filling of a large OM 3 vessel. Smooth ostial narrowing of Brenda RCA of approximately 30 to 40%.   However with catheter engagement there was significant catheter dampening which improved following IC nitroglycerin administration. Occluded SVG which had supplied Brenda LAD. Patent sequential SVG supplying Brenda diagonal vessel which then fills Brenda LAD with previously noted 80% stenosis on an angle in Brenda LAD immediately after Brenda diagonal takeoff, and sequential graft supplying a large OM 3 vessel of Brenda left circumflex coronary artery. RECOMMENDATION: Brenda patient's non-ST segment elevation myocardial infarction most likely is due to acute occlusion of Brenda vein graft which had supplied Brenda LAD.  This graft is 84 weeks old.  I suspect this occlusion may have contributed by Brenda fact that Brenda LAD is supplied by Brenda diagonal vessel which is grafted and there is TIMI-3 flow down Brenda LAD system.  There continues to be a high-grade 80+ percent stenosis on an angle in Brenda LAD arising from Brenda diagonal vessel which 3 weeks ago was not able to be entered with a wire during attempted acute angioplasty.  Since Brenda patient is pain-free and there is a high likelihood of thrombus in Brenda vein graft which has occluded plan initial aggressive medical management.  Will initiate DAPT with aspirin/Brilinta.  Continue beta-blocker therapy, initiate nitrate therapy, and continue aggressive lipid-lowering therapy.  If patient continues to have symptoms, consider addition of ranolazine and possible future intervention to Brenda native LAD after an aggressive medical therapy trial.   ECHOCARDIOGRAM LIMITED  Result Date: 08/08/2020    ECHOCARDIOGRAM LIMITED REPORT   Patient Name:   Brenda Wilkins Date of Exam: 08/08/2020 Medical Rec #:  [id number]     Height:       60.0 in Accession #:    [id number]    Weight:       146.0 lb Date of Birth:  [birth year]      BSA:          1.633 m Patient Age:    75 years      BP:           113/57 mmHg Patient Gender: F             HR:           71 bpm. Exam Location:  Inpatient Procedure: Limited Echo, Limited Color  Doppler, Cardiac Doppler and Intracardiac            Opacification Agent Indications:    NSTEMI I21.4  History:        Patient has prior history of Echocardiogram examinations, most                 recent 07/21/2020. CAD, Prior CABG; Risk Factors:Dyslipidemia.  Sonographer:  Leta Jungling RDCS Referring Phys: [id number] Obe Ahlers E Amahd Morino IMPRESSIONS  1. Septal hypokinesis consistent with post-operative state. Left ventricular ejection fraction, by estimation, is 50 to 55%. Brenda left ventricle has low normal function. Brenda left ventricle demonstrates regional wall motion abnormalities (see scoring diagram/findings for description). Left ventricular diastolic parameters are consistent with Grade I diastolic dysfunction (impaired relaxation).  2. Right ventricular systolic function is normal. Brenda right ventricular size is normal.  3. Brenda mitral valve is normal in structure. Trivial mitral valve regurgitation. No evidence of mitral stenosis.  4. Brenda aortic valve is tricuspid. Aortic valve regurgitation is not visualized. No aortic stenosis is present.  5. Brenda inferior vena cava is normal in size with greater than 50% respiratory variability, suggesting right atrial pressure of 3 mmHg. FINDINGS  Left Ventricle: Septal hypokinesis consistent with post-operative state. Left ventricular ejection fraction, by estimation, is 50 to 55%. Brenda left ventricle has low normal function. Brenda left ventricle demonstrates regional wall motion abnormalities. Definity contrast agent was given IV to delineate Brenda left ventricular endocardial borders. Brenda left ventricular internal cavity size was normal in size. There is no left ventricular hypertrophy. Left ventricular diastolic parameters are consistent with Grade I diastolic dysfunction (impaired relaxation). Normal left ventricular filling pressure. Right Ventricle: Brenda right ventricular size is normal. No increase in right ventricular wall thickness. Right ventricular systolic function  is normal. Left Atrium: Left atrial size was normal in size. Right Atrium: Right atrial size was normal in size. Pericardium: There is no evidence of pericardial effusion. Mitral Valve: Brenda mitral valve is normal in structure. Trivial mitral valve regurgitation. No evidence of mitral valve stenosis. Tricuspid Valve: Brenda tricuspid valve is normal in structure. Tricuspid valve regurgitation is not demonstrated. No evidence of tricuspid stenosis. Aortic Valve: Brenda aortic valve is tricuspid. Aortic valve regurgitation is not visualized. No aortic stenosis is present. Pulmonic Valve: Brenda pulmonic valve was normal in structure. Pulmonic valve regurgitation is not visualized. No evidence of pulmonic stenosis. Aorta: Brenda aortic root is normal in size and structure. Venous: Brenda inferior vena cava is normal in size with greater than 50% respiratory variability, suggesting right atrial pressure of 3 mmHg. IAS/Shunts: No atrial level shunt detected by color flow Doppler. LEFT VENTRICLE PLAX 2D LVIDd:         3.50 cm     Diastology LVIDs:         2.70 cm     LV e' medial:    5.98 cm/s LV PW:         0.70 cm     LV E/e' medial:  8.7 LV IVS:        1.00 cm     LV e' lateral:   7.72 cm/s LVOT diam:     1.50 cm     LV E/e' lateral: 6.8 LV SV:         35 LV SV Index:   21 LVOT Area:     1.77 cm  LV Volumes (MOD) LV vol d, MOD A2C: 89.4 ml LV vol d, MOD A4C: 74.9 ml LV vol s, MOD A2C: 18.1 ml LV vol s, MOD A4C: 42.7 ml LV SV MOD A2C:     71.3 ml LV SV MOD A4C:     74.9 ml LV SV MOD BP:      61.8 ml LEFT ATRIUM         Index LA diam:    2.60 cm 1.59 cm/m  AORTIC VALVE LVOT Vmax:  89.40 cm/s LVOT Vmean:  65.800 cm/s LVOT VTI:    0.197 m MITRAL VALVE MV Area (PHT): 4.39 cm    SHUNTS MV Decel Time: 173 msec    Systemic VTI:  0.20 m MV E velocity: 52.30 cm/s  Systemic Diam: 1.50 cm MV A velocity: 78.80 cm/s MV E/A ratio:  0.66 Chilton Siiffany Arnold MD Electronically signed by Chilton Siiffany Lovejoy MD Signature Date/Time: 08/08/2020/5:00:22 PM     Final     Cardiac Studies   Cath 08/09/19:  Prox Cx lesion is 80% stenosed.  Prox LAD to Mid LAD lesion is 100% stenosed.  Mid LAD lesion is 80% stenosed.  Origin to Prox Graft lesion is 100% stenosed.  Ost RCA lesion is 40% stenosed.  There is mild to moderate left ventricular systolic dysfunction.  LV end diastolic pressure is mildly elevated.   Mild LV dysfunction with mild anterolateral hypocontractility, EF estimate at 40% with LVEDP at 20 mmHg.  Significant native CAD with total occlusion of Brenda LAD immediately proximal to Brenda takeoff of Brenda diagonal vessel.  Normal bifurcating ramus intermediate vessel.  Large left circumflex coronary artery with 80% proximal stenosis and competitive filling of a large OM 3 vessel.  Smooth ostial narrowing of Brenda RCA of approximately 30 to 40%.  However with catheter engagement there was significant catheter dampening which improved following IC nitroglycerin administration.  Occluded SVG which had supplied Brenda LAD.  Patent sequential SVG supplying Brenda diagonal vessel which then fills Brenda LAD with previously noted 80% stenosis on an angle in Brenda LAD immediately after Brenda diagonal takeoff, and sequential graft supplying a large OM 3 vessel of Brenda left circumflex coronary artery.  RECOMMENDATION: Brenda patient's non-ST segment elevation myocardial infarction most likely is due to acute occlusion of Brenda vein graft which had supplied Brenda LAD.  This graft is 743 weeks old.  I suspect this occlusion may have contributed by Brenda fact that Brenda LAD is supplied by Brenda diagonal vessel which is grafted and there is TIMI-3 flow down Brenda LAD system.  There continues to be a high-grade 80+ percent stenosis on an angle in Brenda LAD arising from Brenda diagonal vessel which 3 weeks ago was not able to be entered with a wire during attempted acute angioplasty.  Since Brenda patient is pain-free and there is a high likelihood of thrombus in Brenda vein graft which has  occluded plan initial aggressive medical management.  Will initiate DAPT with aspirin/Brilinta.  Continue beta-blocker therapy, initiate nitrate therapy, and continue aggressive lipid-lowering therapy.  If patient continues to have symptoms, consider addition of ranolazine and possible future intervention to Brenda native LAD after an aggressive medical therapy trial.   Diagnostic Dominance: Right    Intervention   TTE 08/08/20: IMPRESSIONS  1. Septal hypokinesis consistent with post-operative state. Left  ventricular ejection fraction, by estimation, is 50 to 55%. Brenda left  ventricle has low normal function. Brenda left ventricle demonstrates  regional wall motion abnormalities (see scoring  diagram/findings for description). Left ventricular diastolic parameters  are consistent with Grade I diastolic dysfunction (impaired relaxation).  2. Right ventricular systolic function is normal. Brenda right ventricular  size is normal.  3. Brenda mitral valve is normal in structure. Trivial mitral valve  regurgitation. No evidence of mitral stenosis.  4. Brenda aortic valve is tricuspid. Aortic valve regurgitation is not  visualized. No aortic stenosis is present.  5. Brenda inferior vena cava is normal in size with greater than 50%  respiratory variability, suggesting right  atrial pressure of 3 mmHg.   Cath 07/18/20:   1st Diag lesion is 90% stenosed.  Prox Cx lesion is 75% stenosed.  Prox LAD-1 lesion is 100% stenosed.  Prox LAD-2 lesion is 95% stenosed with 95% stenosed side branch in 1st Diag.  Post intervention, there is a 95% residual stenosis.  Post intervention, there is a 95% residual stenosis.  Post intervention, Brenda side branch was reduced to 95% residual stenosis.  1. Acute occlusion of Brenda proximal LAD at site of very complex bifurcation lesion. Successful restoration of antegrade flow with POBA into Brenda first diagonal branch. Unable to cross Brenda lesion in Brenda LAD distal to Brenda  diagonal with a wire.   Plan: emergent CABG.   Diagnostic Dominance: Right    Intervention      TTE 07/17/20: IMPRESSIONS  1. Left ventricular ejection fraction, by estimation, is 55 to 60%. Brenda  left ventricle has normal function. Brenda left ventricle has no regional  wall motion abnormalities. There is moderate asymmetric left ventricular  hypertrophy. Left ventricular  diastolic parameters are consistent with Grade I diastolic dysfunction  (impaired relaxation).  2. Right ventricular systolic function is normal. Brenda right ventricular  size is normal.  3. Brenda mitral valve is grossly normal. No evidence of mitral valve  regurgitation.  4. Brenda aortic valve was not well visualized. Aortic valve regurgitation  is not visualized. No aortic stenosis is present.   Comparison(s): No prior Echocardiogram.   Conclusion(s)/Recommendation(s): Normal biventricular function without  evidence of hemodynamically significant valvular heart disease.   FINDINGS  Left Ventricle: Left ventricular ejection fraction, by estimation, is 55  to 60%. Brenda left ventricle has normal function. Brenda left ventricle has no  regional wall motion abnormalities. Brenda left ventricular internal cavity  size was small. There is moderate  asymmetric left ventricular hypertrophy. Left ventricular diastolic  parameters are consistent with Grade I diastolic dysfunction (impaired  relaxation).   Right Ventricle: Brenda right ventricular size is normal. No increase in  right ventricular wall thickness. Right ventricular systolic function is  normal.   Left Atrium: Left atrial size was normal in size.   Right Atrium: Right atrial size was normal in size.   Pericardium: There is no evidence of pericardial effusion.   Mitral Valve: Brenda mitral valve is grossly normal. No evidence of mitral  valve regurgitation.   Tricuspid Valve: Brenda tricuspid valve is grossly normal. Tricuspid valve  regurgitation  is not demonstrated.   Aortic Valve: Brenda aortic valve was not well visualized. Aortic valve  regurgitation is not visualized. No aortic stenosis is present.   Pulmonic Valve: Brenda pulmonic valve was grossly normal. Pulmonic valve  regurgitation is not visualized.   Aorta: Brenda aortic root and ascending aorta are structurally normal, with  no evidence of dilitation.   IAS/Shunts: Brenda atrial septum is grossly normal.   07/18/20: CABG Op note: Procedure:  1. [facility] Sternotomy 2. Extracorporeal circulation 3. Coronary artery bypass grafting x 3   Saphenous veingraft to Brenda LAD  Sequential SVG to diagonal and OM.   4. Endoscopic vein harvest from Brenda rightleg  Patient Profile     75 y.o. 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 Cath with occluded SVG-LAD graft but TIMI  3 flow down LAD from SVG-Diag graft with plans for medical management.  Assessment & Plan     #NSTEMI secondary to newly occluded SVG-to-LAD graft #Multivessel CAD s/p CABG on 07/18/20 with SVG to LAD, sequential SVG to Diag and OM Patient with recent admission for NSTEMI found to have multivessel CAD on cath on 07/17/20 prompting emergent CABG on 07/18/20 as detailed above. LIMA deemed not a suitable bypass conduit due to small size. TTE on 07/17/20 with preserved LVEF 55-60%, no WMA, normal RV function. Post-operatively, she required inotropic support initially but was weaned off and was able to go home on 07/25/20. She was doing well until 01/02 when she developed acute onset back pain radiating to her chest. She presented to [facility] ED where trop up-trended from 30-->500 and ECG with new TWI in Brenda lateral leads (chronic q waves in V1-V3). Underwent  coronary angiography which revealed occluded SVG-LAD graft. Suspect occlusion may have contributed by Brenda fact that Brenda LAD is supplied by Brenda diagonal vessel which is grafted and there is TIMI-3 flow down Brenda LAD system. Given concern for significant thrombus in Brenda SVG-LAD graft and TIMI 3 flow down Brenda LAD from SVG-Diag graft, Brenda decision was made to pursue medical management at this time. If fails, can attempt to intervene on proximal, native LAD lesion. -Continue aggressive medical management with ASA, brilinta -Add imdur 30mg  daily -Continue atorvastatin 80mg  daily -Change metop to 25mg  XL daily -Add ACE/ARB as out-patient if able pending blood pressure -If fails medical management, can pursue intervention on native LAD  Ambulate patient this morning and if stable and tolerating medications, can discharge home with follow-up with Cardiology in 1-2 weeks.  For questions or updates, please contact [facility] Please consult [email or web address] for contact info under        Signed, , MD  08/09/2020, 8:09 AM

## 2020-08-09 NOTE — TOC Transition Note (Signed)
Transition of Care [facility]) - CM/SW Discharge Note   Patient Details  Name: Brenda Wilkins MRN: [id number] Date of Birth: [birth year]  [facility]) CM/SW Contact:  Leone Haven, RN Phone Number: 08/09/2020, 4:02 PM   Clinical Narrative:    NCM spoke with patient, informed her that her copay for refills is around 45.00.  NCM informed the Staff RN to inform MD to send the 30 day brilinta to the [facility] pharmacy.    Final next level of care: Home/Self Care Barriers to Discharge: No Barriers Identified   Patient Goals and CMS Choice        Discharge Placement                       Discharge Plan and Services                                     Social Determinants of Health (SDOH) Interventions     Readmission Risk Interventions No flowsheet data found.

## 2020-08-09 NOTE — TOC Benefit Eligibility Note (Signed)
Transition of Care [facility]) Benefit Eligibility Note    Patient Details  Name: Brenda Wilkins MRN: [id number] Date of Birth: February 17, 1946   Medication/Dose: Marden Noble 90 MG BID     Tier: 3 Drug  Prescription Coverage Preferred Pharmacy: [facility]  and   Lindaann Slough with Person/Company/Phone Number:: YESENIA  @  [employer] # [phone]  Co-Pay: $45.00  Prior Approval: No  Deductible:  (NO DEDUCTIBLE)       Mardene Sayer Phone Number: 08/09/2020, 1:50 PM

## 2020-08-09 NOTE — Discharge Summary (Addendum)
Discharge Summary    Patient ID: Brenda Wilkins MRN: [id number]; DOB: 23-Sep-1945  Admit date: 08/07/2020 Discharge date: 08/09/2020  Primary Care Provider: Patient, No Pcp Per  Primary Cardiologist: Meriam Sprague, MD  Primary Electrophysiologist:  None   Discharge Diagnoses    Principal Problem:   NSTEMI (non-ST elevated myocardial infarction) [facility]) Active Problems:   Hyperlipidemia   S/P CABG x 3   Coronary artery disease    Diagnostic Studies/Procedures    Echocardiogram 08/08/20: 1. Septal hypokinesis consistent with post-operative state. Left  ventricular ejection fraction, by estimation, is 50 to 55%. The left  ventricle has low normal function. The left ventricle demonstrates  regional wall motion abnormalities (see scoring  diagram/findings for description). Left ventricular diastolic parameters  are consistent with Grade I diastolic dysfunction (impaired relaxation).   2. Right ventricular systolic function is normal. The right ventricular  size is normal.   3. The mitral valve is normal in structure. Trivial mitral valve  regurgitation. No evidence of mitral stenosis.   4. The aortic valve is tricuspid. Aortic valve regurgitation is not  visualized. No aortic stenosis is present.   5. The inferior vena cava is normal in size with greater than 50%  respiratory variability, suggesting right atrial pressure of 3 mmHg.    Left heart catheterization 08/08/20: Prox Cx lesion is 80% stenosed. Prox LAD to Mid LAD lesion is 100% stenosed. Mid LAD lesion is 80% stenosed. Origin to Prox Graft lesion is 100% stenosed. Ost RCA lesion is 40% stenosed. There is mild to moderate left ventricular systolic dysfunction. LV end diastolic pressure is mildly elevated.   Mild LV dysfunction with mild anterolateral hypocontractility, EF estimate at 40% with LVEDP at 20 mmHg.   Significant native CAD with total occlusion of the LAD immediately proximal to the takeoff of the diagonal  vessel.   Normal bifurcating ramus intermediate vessel.   Large left circumflex coronary artery with 80% proximal stenosis and competitive filling of a large OM 3 vessel.   Smooth ostial narrowing of the RCA of approximately 30 to 40%.  However with catheter engagement there was significant catheter dampening which improved following IC nitroglycerin administration.   Occluded SVG which had supplied the LAD.   Patent sequential SVG supplying the diagonal vessel which then fills the LAD with previously noted 80% stenosis on an angle in the LAD immediately after the diagonal takeoff, and sequential graft supplying a large OM 3 vessel of the left circumflex coronary artery.   RECOMMENDATION: The patient's non-ST segment elevation myocardial infarction most likely is due to acute occlusion of the vein graft which had supplied the LAD.  This graft is 49 weeks old.  I suspect this occlusion may have contributed by the fact that the LAD is supplied by the diagonal vessel which is grafted and there is TIMI-3 flow down the LAD system.  There continues to be a high-grade 80+ percent stenosis on an angle in the LAD arising from the diagonal vessel which 3 weeks ago was not able to be entered with a wire during attempted acute angioplasty.  Since the patient is pain-free and there is a high likelihood of thrombus in the vein graft which has occluded plan initial aggressive medical management.  Will initiate DAPT with aspirin/Brilinta.  Continue beta-blocker therapy, initiate nitrate therapy, and continue aggressive lipid-lowering therapy.  If patient continues to have symptoms, consider addition of ranolazine and possible future intervention to the native LAD after an aggressive medical therapy trial.  Diagnostic Dominance: Right      _____________   History of Present Illness     Brenda Wilkins is a 75 y.o. female with history of recent NSTEMI in early December found to have multivessel CAD on cath  07/17/20 s/p 3V CABG on 07/18/20 (SVG to LAD, sequential SVG to Diag and OM), HLD, and gout, who re-presented to ED with acute onset substernal chest pain found to have elevated troponin consistent with NSTEMI.   Hospital Course     Consultants: None   1. NSTEMI in patient with CAD s/p CABG 07/18/20: patient recently underwent emergent CABG 07/18/20 for management of multivessel CAD with SVG to LAD and SVG to diagonal/OM; LIMA deemed not suitable bypass conduit due to small size. She was discharge home 07/25/20. She was doing well until 08/07/20 when she developed acute onset back pain radiating to her chest. HsTrop uptrended from 30>500 with new lateral TWI on EKG. She underwent LHC 08/08/20 which showed occluded SVG-LAD graft. It was suspected that her occlusion may have been contributed to by the fact that the LAD is supplied by the diagonal vessel which is grafted and there is TIMI-3 flow down the LAD system. Given concern for significant thrombus in the SVG-LAD graft and TIMI 3 flow down the LAD from SVG-Diag graft, the decision was made to pursue medical management at this time with consideration for intervention to native pLAD lesion in the future if refractory symptoms. She was started on brilinta in addition to low dose aspirin. She was started on imdur 30mg  daily. Her symptoms improved and she was deemed stable for discharge.  - Continue aspirin and brilinta - Continue statin - Continue BBlocker - transitioned from tartrate to succinate this admission. - Continue Imdur  - Consider addition of ACEi/ARB outpatient if room in BP  2. HLD: LDL 183 07/17/20 - Continue atorvastatin - Will need FLP/LFTs in 4 weeks for close monitoring.   Did the patient have an acute coronary syndrome (MI, NSTEMI, STEMI, etc) this admission?:  Yes                               AHA/ACC Clinical Performance & Quality Measures: Aspirin prescribed? - Yes ADP Receptor Inhibitor (Plavix/Clopidogrel, Brilinta/Ticagrelor  or Effient/Prasugrel) prescribed (includes medically managed patients)? - Yes Beta Blocker prescribed? - Yes High Intensity Statin (Lipitor 40-80mg  or Crestor 20-40mg ) prescribed? - Yes EF assessed during THIS hospitalization? - Yes For EF <40%, was ACEI/ARB prescribed? - Not Applicable (EF >/= 40%) For EF <40%, Aldosterone Antagonist (Spironolactone or Eplerenone) prescribed? - Not Applicable (EF >/= 40%) Cardiac Rehab Phase II ordered (including medically managed patients)? - Yes       _____________  Discharge Vitals Blood pressure (!) 100/54, pulse 84, temperature 98.8 F (37.1 C), temperature source Oral, resp. rate 17, height 5' (1.524 m), weight 66.2 kg, SpO2 96 %.  Filed Weights   08/07/20 2000 08/07/20 2345  Weight: 69.9 kg 66.2 kg    Labs & Radiologic Studies    CBC Recent Labs    08/07/20 1731 08/09/20 0333  WBC 9.1 7.7  HGB 12.0 10.5*  HCT 39.5 32.8*  MCV 93.8 92.9  PLT 400 332   Basic Metabolic Panel Recent Labs    10/07/20 0547 08/09/20 0333  NA 141 139  K 4.3 4.3  CL 108 107  CO2 23 23  GLUCOSE 97 104*  BUN 14 16  CREATININE 0.83 0.90  CALCIUM 8.8*  8.6*   Liver Function Tests No results for input(s): AST, ALT, ALKPHOS, BILITOT, PROT, ALBUMIN in the last 72 hours. No results for input(s): LIPASE, AMYLASE in the last 72 hours. High Sensitivity Troponin:   Recent Labs  Lab 07/18/20 0235 08/07/20 1731 08/07/20 1918 08/08/20 0547 08/08/20 1427  TROPONINIHS 174* 31* 101* 570* 358*    BNP Invalid input(s): POCBNP D-Dimer No results for input(s): DDIMER in the last 72 hours. Hemoglobin A1C No results for input(s): HGBA1C in the last 72 hours. Fasting Lipid Panel No results for input(s): CHOL, HDL, LDLCALC, TRIG, CHOLHDL, LDLDIRECT in the last 72 hours. Thyroid Function Tests No results for input(s): TSH, T4TOTAL, T3FREE, THYROIDAB in the last 72 hours.  Invalid input(s): FREET3 _____________  DG Chest 2 View  Result Date:  07/23/2020 CLINICAL DATA:  History of CABG EXAM: CHEST - 2 VIEW COMPARISON:  07/20/2020 chest radiograph. FINDINGS: Intact sternotomy wires. Stable cardiomediastinal silhouette with mild cardiomegaly. No pneumothorax. Small bilateral pleural effusions, stable. No pulmonary edema. Mild bibasilar atelectasis, similar. IMPRESSION: 1. Stable mild cardiomegaly without pulmonary edema. 2. Stable small bilateral pleural effusions and mild bibasilar atelectasis. Electronically Signed   By: Delbert Phenix M.D.   On: 07/23/2020 10:04   CARDIAC CATHETERIZATION  Result Date: 08/08/2020  Prox Cx lesion is 80% stenosed.  Prox LAD to Mid LAD lesion is 100% stenosed.  Mid LAD lesion is 80% stenosed.  Origin to Prox Graft lesion is 100% stenosed.  Ost RCA lesion is 40% stenosed.  There is mild to moderate left ventricular systolic dysfunction.  LV end diastolic pressure is mildly elevated.  Mild LV dysfunction with mild anterolateral hypocontractility, EF estimate at 40% with LVEDP at 20 mmHg. Significant native CAD with total occlusion of the LAD immediately proximal to the takeoff of the diagonal vessel. Normal bifurcating ramus intermediate vessel. Large left circumflex coronary artery with 80% proximal stenosis and competitive filling of a large OM 3 vessel. Smooth ostial narrowing of the RCA of approximately 30 to 40%.  However with catheter engagement there was significant catheter dampening which improved following IC nitroglycerin administration. Occluded SVG which had supplied the LAD. Patent sequential SVG supplying the diagonal vessel which then fills the LAD with previously noted 80% stenosis on an angle in the LAD immediately after the diagonal takeoff, and sequential graft supplying a large OM 3 vessel of the left circumflex coronary artery. RECOMMENDATION: The patient's non-ST segment elevation myocardial infarction most likely is due to acute occlusion of the vein graft which had supplied the LAD.  This  graft is 97 weeks old.  I suspect this occlusion may have contributed by the fact that the LAD is supplied by the diagonal vessel which is grafted and there is TIMI-3 flow down the LAD system.  There continues to be a high-grade 80+ percent stenosis on an angle in the LAD arising from the diagonal vessel which 3 weeks ago was not able to be entered with a wire during attempted acute angioplasty.  Since the patient is pain-free and there is a high likelihood of thrombus in the vein graft which has occluded plan initial aggressive medical management.  Will initiate DAPT with aspirin/Brilinta.  Continue beta-blocker therapy, initiate nitrate therapy, and continue aggressive lipid-lowering therapy.  If patient continues to have symptoms, consider addition of ranolazine and possible future intervention to the native LAD after an aggressive medical therapy trial.   CARDIAC CATHETERIZATION  Result Date: 07/18/2020  1st Diag lesion is 90% stenosed.  Prox Cx lesion  is 75% stenosed.  Prox LAD-1 lesion is 100% stenosed.  Prox LAD-2 lesion is 95% stenosed with 95% stenosed side branch in 1st Diag.  Post intervention, there is a 95% residual stenosis.  Post intervention, there is a 95% residual stenosis.  Post intervention, the side branch was reduced to 95% residual stenosis.  1. Acute occlusion of the proximal LAD at site of very complex bifurcation lesion. Successful restoration of antegrade flow with POBA into the first diagonal branch. Unable to cross the lesion in the LAD distal to the diagonal with a wire. Plan: emergent CABG.   CARDIAC CATHETERIZATION  Result Date: 07/18/2020 1.  Critical stenosis of the proximal LAD/first diagonal that a major bifurcation point 2.  Severe eccentric proximal left circumflex stenosis 3.  Patent right coronary artery with no significant stenosis (dominant vessel) 4.  Normal LV function with LVEF 55 to 65% Recommend: Cardiac surgical consultation for consideration of CABG.   If patient felt to be a poor candidate for CABG, would attempt bifurcation PCI of the LAD diagonal and PCI the proximal circumflex.  With normal LV function, good angiographic targets, and no major comorbidities I suspect she will be a suitable candidate for coronary bypass surgery.  DG Chest Port 1 View  Result Date: 07/20/2020 CLINICAL DATA:  Status post coronary bypass grafting EXAM: PORTABLE CHEST 1 VIEW COMPARISON:  07/19/2020 FINDINGS: Endotracheal tube and gastric catheter have been removed in the interval. Swan-Ganz catheter has also been removed. Right jugular sheath remains in place left-sided thoracostomy catheter, mediastinal drain and pericardial drain have all been removed. No recurrent pneumothorax is noted. Minimal left basilar atelectasis is noted similar to that seen on the prior study. IMPRESSION: Left basilar atelectasis. Tubes and lines removed in the interval. Electronically Signed   By: Alcide CleverMark  Lukens M.D.   On: 07/20/2020 08:02   DG Chest Port 1 View  Result Date: 07/19/2020 CLINICAL DATA:  Intubated.  Status post CABG. EXAM: PORTABLE CHEST 1 VIEW COMPARISON:  07/18/2020 FINDINGS: Sequelae of CABG are again identified. Endotracheal tube terminates just below the level of the clavicles, unchanged. An enteric tube terminates at the level of the GE junction, unchanged. A left chest tube and mediastinal drains remain in place. A right jugular Swan-Ganz catheter terminates over the main pulmonary artery, unchanged. Widened appearance of the mediastinum is unchanged as is asymmetric mild pleural/extrapleural density at the left lung apex. Left greater than right basilar lung opacities are unchanged. No sizable pleural effusion or pneumothorax is identified. IMPRESSION: 1. Unchanged support devices.  No pneumothorax. 2. Unchanged bibasilar atelectasis, likely atelectasis. Electronically Signed   By: Sebastian AcheAllen  Grady M.D.   On: 07/19/2020 06:56   DG Chest Port 1 View  Result Date:  07/18/2020 CLINICAL DATA:  Status post coronary bypass graft. EXAM: PORTABLE CHEST 1 VIEW COMPARISON:  July 16, 2020. FINDINGS: Endotracheal tube is in good position. Distal tip of nasogastric tube is seen at the gastroesophageal junction. Right internal jugular Swan-Ganz catheter is noted with tip in expected position of main pulmonary artery. Left-sided chest tube is noted without pneumothorax. Right lung is clear. Bony thorax is unremarkable. IMPRESSION: Left-sided chest tube is noted without pneumothorax. Distal tip of nasogastric tube is seen at the gastroesophageal junction and advancement is recommended. Right internal jugular Swan-Ganz catheter tip is in expected position of main pulmonary artery. Electronically Signed   By: Lupita RaiderJames  Green Jr M.D.   On: 07/18/2020 20:31   DG Chest Port 1 View  Result Date: 07/16/2020 CLINICAL  DATA:  Chest pain. EXAM: PORTABLE CHEST 1 VIEW COMPARISON:  None. FINDINGS: The cardiomediastinal contours are normal. There is mild interstitial coarsening of unknown chronicity. Pulmonary vasculature is normal. No consolidation, pleural effusion, or pneumothorax. No acute osseous abnormalities are seen. IMPRESSION: Mild interstitial coarsening of unknown chronicity. This may be chronic or represent atypical infection. Electronically Signed   By: Narda Rutherford M.D.   On: 07/16/2020 17:58   ECHOCARDIOGRAM COMPLETE  Result Date: 07/17/2020    ECHOCARDIOGRAM REPORT   Patient Name:   [facility] Stemler Date of Exam: 07/17/2020 Medical Rec #:  [id number]     Height:       60.0 in Accession #:    [id number]    Weight:       153.3 lb Date of Birth:  [birth year]      BSA:          1.667 m Patient Age:    74 years      BP:           112/66 mmHg Patient Gender: F             HR:           71 bpm. Exam Location:  Inpatient Procedure: 2D Echo, Cardiac Doppler and Color Doppler Indications:    Acute Coronary Syndrome I24.9  History:        Patient has no prior history of Echocardiogram  examinations.                 Risk Factors:Dyslipidemia.  Sonographer:    Elmarie Shiley Dance Referring Phys: [id number] DENNIS NARCISSE JR IMPRESSIONS  1. Left ventricular ejection fraction, by estimation, is 55 to 60%. The left ventricle has normal function. The left ventricle has no regional wall motion abnormalities. There is moderate asymmetric left ventricular hypertrophy. Left ventricular diastolic parameters are consistent with Grade I diastolic dysfunction (impaired relaxation).  2. Right ventricular systolic function is normal. The right ventricular size is normal.  3. The mitral valve is grossly normal. No evidence of mitral valve regurgitation.  4. The aortic valve was not well visualized. Aortic valve regurgitation is not visualized. No aortic stenosis is present. Comparison(s): No prior Echocardiogram. Conclusion(s)/Recommendation(s): Normal biventricular function without evidence of hemodynamically significant valvular heart disease. FINDINGS  Left Ventricle: Left ventricular ejection fraction, by estimation, is 55 to 60%. The left ventricle has normal function. The left ventricle has no regional wall motion abnormalities. The left ventricular internal cavity size was small. There is moderate  asymmetric left ventricular hypertrophy. Left ventricular diastolic parameters are consistent with Grade I diastolic dysfunction (impaired relaxation). Right Ventricle: The right ventricular size is normal. No increase in right ventricular wall thickness. Right ventricular systolic function is normal. Left Atrium: Left atrial size was normal in size. Right Atrium: Right atrial size was normal in size. Pericardium: There is no evidence of pericardial effusion. Mitral Valve: The mitral valve is grossly normal. No evidence of mitral valve regurgitation. Tricuspid Valve: The tricuspid valve is grossly normal. Tricuspid valve regurgitation is not demonstrated. Aortic Valve: The aortic valve was not well visualized. Aortic  valve regurgitation is not visualized. No aortic stenosis is present. Pulmonic Valve: The pulmonic valve was grossly normal. Pulmonic valve regurgitation is not visualized. Aorta: The aortic root and ascending aorta are structurally normal, with no evidence of dilitation. IAS/Shunts: The atrial septum is grossly normal.  LEFT VENTRICLE PLAX 2D LVIDd:         3.60 cm  Diastology LVIDs:  2.50 cm  LV e' medial:    5.00 cm/s LV PW:         1.50 cm  LV E/e' medial:  12.0 LV IVS:        1.00 cm  LV e' lateral:   6.42 cm/s LVOT diam:     2.00 cm  LV E/e' lateral: 9.3 LV SV:         61 LV SV Index:   37 LVOT Area:     3.14 cm  RIGHT VENTRICLE             IVC RV Basal diam:  2.30 cm     IVC diam: 1.50 cm RV S prime:     14.10 cm/s TAPSE (M-mode): 1.7 cm LEFT ATRIUM             Index       RIGHT ATRIUM           Index LA diam:        2.80 cm 1.68 cm/m  RA Area:     10.80 cm LA Vol (A2C):   33.5 ml 20.09 ml/m RA Volume:   22.50 ml  13.50 ml/m LA Vol (A4C):   23.1 ml 13.86 ml/m LA Biplane Vol: 28.1 ml 16.86 ml/m  AORTIC VALVE LVOT Vmax:   90.50 cm/s LVOT Vmean:  60.700 cm/s LVOT VTI:    0.194 m  AORTA Ao Root diam: 2.70 cm Ao Asc diam:  3.20 cm MITRAL VALVE MV Area (PHT): 2.34 cm     SHUNTS MV Decel Time: 324 msec     Systemic VTI:  0.19 m MV E velocity: 60.00 cm/s   Systemic Diam: 2.00 cm MV A velocity: 105.00 cm/s MV E/A ratio:  0.57 Riley LamMahesh Chandrasekhar MD Electronically signed by Riley LamMahesh Chandrasekhar MD Signature Date/Time: 07/17/2020/12:09:10 PM    Final    ECHO INTRAOPERATIVE TEE  Result Date: 07/21/2020  *INTRAOPERATIVE TRANSESOPHAGEAL REPORT *  [facility]  Date of Exam: 07/18/2020 Medical Rec #:  [id number]      Height:       60.0 in Accession #:    [id number]     Weight:       154.8 lb Date of Birth:  02/11/1946       BSA:          1.67 m Patient Age:    74 years       BP:           151/86 mmHg Patient Gender: F              HR:           104 bpm. Exam Location:  Anesthesiology  Transesophogeal exam was perform intraoperatively during surgical procedure. Patient was closely monitored under general anesthesia during the entirety of examination. Indications:     CAD Native Vessel i25.0 Sonographer:     Irving BurtonEmily Senior RDCS Performing Phys: [id number] Alleen BorneBRYAN K BARTLE Diagnosing Phys: Aayana Reinertsen RobertsJames Singer MD Complications: No known complications during this procedure. POST-OP IMPRESSIONS - Comments: After initial exam pt developed acute global LV depression and subsequent Ventricular Fib. The patient was quickly placed on bypass. Post bypass images demonstrated improved LVFx with less anterior septal hypokenesis compared to the intitial exam. PRE-OP FINDINGS  Left Ventricle: The left ventricle has mild-moderately reduced systolic function, with an ejection fraction of 40-45% 41%. The cavity size was normal. There is no increase in left ventricular wall thickness. Right Ventricle: The right ventricle has  normal systolic function. The cavity was normal. There is no increase in right ventricular wall thickness. Left Atrium: Left atrial size was normal in size. Right Atrium: Right atrial size was normal in size. Interatrial Septum: No atrial level shunt detected by color flow Doppler. Pericardium: There is no evidence of pericardial effusion. Mitral Valve: The mitral valve is normal in structure. Mild thickening of the mitral valve leaflet. Mild calcification of the mitral valve leaflet. There is moderate mitral annular calcification present. Mitral valve regurgitation is mild by color flow Doppler. The MR jet is centrally-directed. There is No evidence of mitral stenosis. Tricuspid Valve: The tricuspid valve was normal in structure. Tricuspid valve regurgitation is mild by color flow Doppler. Aortic Valve: The aortic valve is tricuspid There is mild thickening of the aortic valve and There is mild calcification of the aortic valve Aortic valve regurgitation is trivial by color flow Doppler. There is no stenosis of  the aortic valve. Pulmonic Valve: The pulmonic valve was normal in structure. Pulmonic valve regurgitation is trivial by color flow Doppler. Aorta: The ascending aorta was not well visualized. The aortic arch was not well visualized. There is evidence of layered immobile plaque in the descending aorta; Grade II, measuring 2-83mm in size. +--------------+-------++ LEFT VENTRICLE        +--------------+-------++ PLAX 2D               +--------------+-------++ LVIDd:        2.90 cm +--------------+-------++ LVIDs:        2.45 cm +--------------+-------++ LV SV:        11 ml   +--------------+-------++ LV SV Index:  6.27    +--------------+-------++                       +--------------+-------++  Duane Boston MD Electronically signed by Duane Boston MD Signature Date/Time: 07/21/2020/7:17:53 AM    Final    ECHOCARDIOGRAM LIMITED  Result Date: 08/08/2020    ECHOCARDIOGRAM LIMITED REPORT   Patient Name:   [facility] Date of Exam: 08/08/2020 Medical Rec #:  [id number]     Height:       60.0 in Accession #:    [id number]    Weight:       146.0 lb Date of Birth:  [birth year]      BSA:          1.633 m Patient Age:    79 years      BP:           113/57 mmHg Patient Gender: F             HR:           71 bpm. Exam Location:  Inpatient Procedure: Limited Echo, Limited Color Doppler, Cardiac Doppler and Intracardiac            Opacification Agent Indications:    NSTEMI I21.4  History:        Patient has prior history of Echocardiogram examinations, most                 recent 07/21/2020. CAD, Prior CABG; Risk Factors:Dyslipidemia.  Sonographer:    Darlina Sicilian RDCS Referring Phys: [id number] [place]  1. Septal hypokinesis consistent with post-operative state. Left ventricular ejection fraction, by estimation, is 50 to 55%. The left ventricle has low normal function. The left ventricle demonstrates regional wall motion abnormalities (see scoring diagram/findings for  description). Left ventricular diastolic parameters are consistent with  Grade I diastolic dysfunction (impaired relaxation).  2. Right ventricular systolic function is normal. The right ventricular size is normal.  3. The mitral valve is normal in structure. Trivial mitral valve regurgitation. No evidence of mitral stenosis.  4. The aortic valve is tricuspid. Aortic valve regurgitation is not visualized. No aortic stenosis is present.  5. The inferior vena cava is normal in size with greater than 50% respiratory variability, suggesting right atrial pressure of 3 mmHg. FINDINGS  Left Ventricle: Septal hypokinesis consistent with post-operative state. Left ventricular ejection fraction, by estimation, is 50 to 55%. The left ventricle has low normal function. The left ventricle demonstrates regional wall motion abnormalities. Definity contrast agent was given IV to delineate the left ventricular endocardial borders. The left ventricular internal cavity size was normal in size. There is no left ventricular hypertrophy. Left ventricular diastolic parameters are consistent with Grade I diastolic dysfunction (impaired relaxation). Normal left ventricular filling pressure. Right Ventricle: The right ventricular size is normal. No increase in right ventricular wall thickness. Right ventricular systolic function is normal. Left Atrium: Left atrial size was normal in size. Right Atrium: Right atrial size was normal in size. Pericardium: There is no evidence of pericardial effusion. Mitral Valve: The mitral valve is normal in structure. Trivial mitral valve regurgitation. No evidence of mitral valve stenosis. Tricuspid Valve: The tricuspid valve is normal in structure. Tricuspid valve regurgitation is not demonstrated. No evidence of tricuspid stenosis. Aortic Valve: The aortic valve is tricuspid. Aortic valve regurgitation is not visualized. No aortic stenosis is present. Pulmonic Valve: The pulmonic valve was normal in  structure. Pulmonic valve regurgitation is not visualized. No evidence of pulmonic stenosis. Aorta: The aortic root is normal in size and structure. Venous: The inferior vena cava is normal in size with greater than 50% respiratory variability, suggesting right atrial pressure of 3 mmHg. IAS/Shunts: No atrial level shunt detected by color flow Doppler. LEFT VENTRICLE PLAX 2D LVIDd:         3.50 cm     Diastology LVIDs:         2.70 cm     LV e' medial:    5.98 cm/s LV PW:         0.70 cm     LV E/e' medial:  8.7 LV IVS:        1.00 cm     LV e' lateral:   7.72 cm/s LVOT diam:     1.50 cm     LV E/e' lateral: 6.8 LV SV:         35 LV SV Index:   21 LVOT Area:     1.77 cm  LV Volumes (MOD) LV vol d, MOD A2C: 89.4 ml LV vol d, MOD A4C: 74.9 ml LV vol s, MOD A2C: 18.1 ml LV vol s, MOD A4C: 42.7 ml LV SV MOD A2C:     71.3 ml LV SV MOD A4C:     74.9 ml LV SV MOD BP:      61.8 ml LEFT ATRIUM         Index LA diam:    2.60 cm 1.59 cm/m  AORTIC VALVE LVOT Vmax:   89.40 cm/s LVOT Vmean:  65.800 cm/s LVOT VTI:    0.197 m MITRAL VALVE MV Area (PHT): 4.39 cm    SHUNTS MV Decel Time: 173 msec    Systemic VTI:  0.20 m MV E velocity: 52.30 cm/s  Systemic Diam: 1.50 cm MV A velocity: 78.80 cm/s MV E/A  ratio:  0.66 Chilton Si MD Electronically signed by Chilton Si MD Signature Date/Time: 08/08/2020/5:00:22 PM    Final    Disposition   Pt is being discharged home today in good condition.  Follow-up Plans & Appointments     Follow-up Information     Meriam Sprague, MD Follow up on 08/30/2020.   Specialties: Cardiology, Radiology Why: Please arrive 15 minutes early for your 10:00am post-hospital follow-up with Dr. Jola Babinski information: [address] [place] [address] [phone]                Discharge Instructions     Amb Referral to Cardiac Rehabilitation   Complete by: As directed    Diagnosis:  NSTEMI CABG     CABG X ___: 3   After initial  evaluation and assessments completed: Virtual Based Care may be provided alone or in conjunction with Phase 2 Cardiac Rehab based on patient barriers.: Yes       Discharge Medications   Allergies as of 08/09/2020   No Known Allergies      Medication List     STOP taking these medications    metoprolol tartrate 25 MG tablet Commonly known as: LOPRESSOR       TAKE these medications    acetaminophen 325 MG tablet Commonly known as: TYLENOL Take 325-650 mg by mouth every 6 (six) hours as needed for mild pain (or headaches).   aspirin 81 MG EC tablet Take 1 tablet (81 mg total) by mouth daily. Swallow whole. Start taking on: August 10, 2020 What changed:  medication strength how much to take additional instructions   atorvastatin 80 MG tablet Commonly known as: LIPITOR Take 1 tablet (80 mg total) by mouth daily.   ferrous fumarate-b12-vitamic C-folic acid capsule Commonly known as: TRINSICON / FOLTRIN Take 1 capsule by mouth 2 (two) times daily after a meal.   isosorbide mononitrate 30 MG 24 hr tablet Commonly known as: IMDUR Take 1 tablet (30 mg total) by mouth daily. Start taking on: August 10, 2020   metoprolol succinate 25 MG 24 hr tablet Commonly known as: TOPROL-XL Take 0.5 tablets (12.5 mg total) by mouth daily. Start taking on: August 10, 2020   Mitigare 0.6 MG Caps Generic drug: Colchicine Take 0.6 mg by mouth 2 (two) times daily as needed for up to 3 days (For symptoms of gout).   nitroGLYCERIN 0.4 MG SL tablet Commonly known as: NITROSTAT Place 1 tablet (0.4 mg total) under the tongue every 5 (five) minutes x 3 doses as needed for chest pain.   ticagrelor 90 MG Tabs tablet Commonly known as: BRILINTA Take 1 tablet (90 mg total) by mouth 2 (two) times daily.           Outstanding Labs/Studies   Will need repeat FLP/LFTs in 1 month.   Duration of Discharge Encounter   Greater than 30 minutes including physician time.  Signed, Beatriz Stallion, PA-C 08/09/2020, 3:47 PM  Patient seen and examined and agree with Judy Pimple as above. Doing well post-cath. On medical management. Will follow-up in Cardiology clinic in 2-3 weeks.  Laurance Flatten, MD

## 2020-08-15 ENCOUNTER — Other Ambulatory Visit: Payer: Self-pay | Admitting: *Deleted

## 2020-08-15 ENCOUNTER — Telehealth (HOSPITAL_COMMUNITY): Payer: Self-pay

## 2020-08-15 DIAGNOSIS — Z951 Presence of aortocoronary bypass graft: Secondary | ICD-10-CM

## 2020-08-15 NOTE — Telephone Encounter (Signed)
Pt insurance is active and benefits verified through [facility]. Co-pay $10.00, DED $0.00/$0.00 met, out of pocket $3,900.00/$0.00 met, co-insurance 0%. No pre-authorization required. Lori/[employer], 08/15/20 @ 2:17PM, PYK#[id number]  Will contact patient to see if she is interested in the Cardiac Rehab Program. If interested, patient will need to complete follow up appt. Once completed, patient will be contacted for scheduling upon review by the RN Navigator.

## 2020-08-15 NOTE — Telephone Encounter (Signed)
Called patient to see if she is interested in the Cardiac Rehab Program. Patient expressed interest. Explained scheduling process and went over insurance, patient verbalized understanding. Will contact patient for scheduling once f/u has been completed. 

## 2020-08-16 ENCOUNTER — Other Ambulatory Visit: Payer: Self-pay | Admitting: Physician Assistant

## 2020-08-16 NOTE — Progress Notes (Signed)
Cardiology Office Note:    Date:  08/30/2020   ID:  [name], DOB 05/07/1946, MRN [id number]  PCP:  Patient, No Pcp Per  [facility] Cardiologist:  Meriam Sprague, MD  [facility] Electrophysiologist:  None   Referring MD: No ref. provider found    History of Present Illness:    Brenda Wilkins is a 75 y.o. female with a history of known CAD s/p CABG with recent NSTEMI and HLD who presents to clinic for follow-up from her recent hospitalization.  The patient suffered from an NSTEMI in early December found to have multivessel CAD on cath 07/17/20 s/p 3V CABG on 07/18/20(SVG to LAD, sequential SVG to Diag and OM).  Post-operative course was complicated by recurrent chest pain causing her to re-present to the ED. In the ED, she was found to have elevated troponin consistent with NSTEMI. She underwent repeat cath, which revealed occluded SVG-LAD graft but TIMI 3 flow down LAD from SVG-Diag graft. Case was discussed with CV surgery and given brisk TIMI 3 flow down the LAD from the other bypass graft, the decision was made to manage medically. She now presents to clinic for follow-up from her recent hospitalization.  Today, the patient states that she feels overall very well. No recurrent chest pain, shortness of breath, nausea or vomiting. No lightheadednesss or dizziness. She noted a different breathing pattern with the ticagrelor, but she can tolerate it. Her blood pressures are well controlled in 110s/60s. HR running in 70s. She is active walking 25 minutes per day. She is excited to start cardiac rehab.  Past Medical History:  Diagnosis Date  . Coronary artery disease   . Hyperlipidemia   . NSTEMI (non-ST elevated myocardial infarction) (HCC) 07/16/2020  . 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 CARDIAC CATHETERIZATION  08/08/2020  . CORONARY ARTERY BYPASS GRAFT N/A 07/18/2020   Procedure: CORONARY ARTERY BYPASS GRAFTING (CABG), ON  PUMP, TIMES THREE, USING ENDOSCOPICALLY HARVESTED RIGHT GREATER SAPHENOUS VEIN;  Surgeon: Alleen Borne, MD;  Location: [facility] OR;  Service: Open Heart Surgery;  Laterality: N/A;  . CORONARY/GRAFT ACUTE MI REVASCULARIZATION N/A 07/18/2020   Procedure: Coronary/Graft Acute MI Revascularization;  Surgeon: Swazil[place], Peter M, MD;  Location: [facility] INVASIVE CV LAB;  Service: Cardiovascular;  Laterality: N/A;  . LEFT HEART CATH AND CORONARY ANGIOGRAPHY N/A 07/18/2020   Procedure: LEFT HEART CATH AND CORONARY ANGIOGRAPHY;  Surgeon: Tonny Bollman, MD;  Location: [facility] INVASIVE CV LAB;  Service: Cardiovascular;  Laterality: N/A;  . LEFT HEART CATH AND CORS/GRAFTS ANGIOGRAPHY N/A 08/08/2020   Procedure: LEFT HEART CATH AND CORS/GRAFTS ANGIOGRAPHY;  Surgeon: Lennette Bihari, MD;  Location: [facility] INVASIVE CV LAB;  Service: Cardiovascular;  Laterality: N/A;  . TEE WITHOUT CARDIOVERSION N/A 07/18/2020   Procedure: TRANSESOPHAGEAL ECHOCARDIOGRAM (TEE);  Surgeon: Alleen Borne, MD;  Location: [facility] OR;  Service: Open Heart Surgery;  Laterality: N/A;    Current Medications: Current Meds  Medication Sig  . acetaminophen (TYLENOL) 325 MG tablet Take 325-650 mg by mouth every 6 (six) hours as needed for mild pain (or headaches).  Marland Kitchen atorvastatin (LIPITOR) 80 MG tablet Take 1 tablet (80 mg total) by mouth daily.  . colchicine 0.6 MG tablet Take 0.6 mg by mouth 2 (two) times daily as needed (for gout).  . ferrous fumarate-b12-vitamic C-folic acid (TRINSICON / FOLTRIN) capsule Take 1 capsule by mouth 2 (two) times daily after a meal.  . losartan (COZAAR) 25 MG tablet  Take 1 tablet (25 mg total) by mouth daily.  . nitroGLYCERIN (NITROSTAT) 0.4 MG SL tablet Place 1 tablet (0.4 mg total) under the tongue every 5 (five) minutes x 3 doses as needed for chest pain.  . [DISCONTINUED] aspirin 81 MG EC tablet Take 1 tablet (81 mg total) by mouth daily. Swallow whole.  . [DISCONTINUED] Colchicine (MITIGARE) 0.6 MG CAPS Take 0.6 mg by mouth 2  (two) times daily as needed for up to 3 days (For symptoms of gout).  . [DISCONTINUED] isosorbide mononitrate (IMDUR) 30 MG 24 hr tablet Take 1 tablet (30 mg total) by mouth daily.  . [DISCONTINUED] metoprolol succinate (TOPROL-XL) 25 MG 24 hr tablet Take 0.5 tablets (12.5 mg total) by mouth daily.  . [DISCONTINUED] ticagrelor (BRILINTA) 90 MG TABS tablet Take 1 tablet (90 mg total) by mouth 2 (two) times daily.     Allergies:   Patient has no known allergies.   Social History   Socioeconomic History  . Marital status: Unknown    Spouse name: Not on file  . Number of children: Not on file  . Years of education: Not on file  . Highest education level: Not on file  Occupational History  . Not on file  Tobacco Use  . Smoking status: Never Smoker  . Smokeless tobacco: Never Used  Vaping Use  . Vaping Use: Never used  Substance and Sexual Activity  . Alcohol use: Never  . Drug use: Never  . Sexual activity: Not on file  Other Topics Concern  . Not on file  Social History Narrative  . Not on file   Social Determinants of Health   Financial Resource Strain: Not on file  Food Insecurity: Not on file  Transportation Needs: Not on file  Physical Activity: Not on file  Stress: Not on file  Social Connections: Not on file     Family History: The patient's family history is not on file.  ROS:   Please see the history of present illness.    Review of Systems  Constitutional: Negative for chills and fever.  HENT: Negative for nosebleeds.   Eyes: Negative for blurred vision.  Respiratory: Negative for shortness of breath.   Cardiovascular: Negative for chest pain, palpitations, orthopnea, claudication, leg swelling and PND.  Gastrointestinal: Negative for heartburn, nausea and vomiting.  Genitourinary: Negative for hematuria.  Musculoskeletal: Negative for falls.  Neurological: Negative for dizziness and loss of consciousness.  Endo/Heme/Allergies: Negative for polydipsia.   Psychiatric/Behavioral: Negative for substance abuse.    EKGs/Labs/Other Studies Reviewed:    The following studies were reviewed today: Cath 08/09/19:  Prox Cx lesion is 80% stenosed.  Prox LAD to Mid LAD lesion is 100% stenosed.  Mid LAD lesion is 80% stenosed.  Origin to Prox Graft lesion is 100% stenosed.  Ost RCA lesion is 40% stenosed.  There is mild to moderate left ventricular systolic dysfunction.  LV end diastolic pressure is mildly elevated.  Mild LV dysfunction with mild anterolateral hypocontractility, EF estimate at 40% with LVEDP at 20 mmHg.  Significant native CAD with total occlusion of the LAD immediately proximal to the takeoff of the diagonal vessel.  Normal bifurcating ramus intermediate vessel.  Large left circumflex coronary artery with 80% proximal stenosis and competitive filling of a large OM 3 vessel.  Smooth ostial narrowing of the RCA of approximately 30 to 40%. However with catheter engagement there was significant catheter dampening which improved following IC nitroglycerin administration.  Occluded SVG which had supplied the LAD.  Patent sequential SVG supplying the diagonal vessel which then fills the LAD with previously noted 80% stenosis on an angle in the LAD immediately after the diagonal takeoff, and sequential graft supplying a large OM 3 vessel of the left circumflex coronary artery.  RECOMMENDATION: The patient's non-ST segment elevation myocardial infarction most likely is due to acute occlusion of the vein graft which had supplied the LAD. This graft is 19 weeks old. I suspect this occlusion may have contributed by the fact that the LAD is supplied by the diagonal vessel which is grafted and there is TIMI-3 flow down the LAD system. There continues to be a high-grade 80+ percent stenosis on an angle in the LAD arising from the diagonal vessel which 3 weeks ago was not able to be entered with a wire during attempted acute  angioplasty. Since the patient is pain-free and there is a high likelihood of thrombus in the vein graft which has occluded plan initial aggressive medical management. Will initiate DAPT with aspirin/Brilinta. Continue beta-blocker therapy, initiate nitrate therapy, and continue aggressive lipid-lowering therapy. If patient continues to have symptoms, consider addition of ranolazine and possible future intervention to the native LAD after an aggressive medical therapy trial.   Diagnostic Dominance: Right    Intervention   TTE 08/08/20: IMPRESSIONS  1. Septal hypokinesis consistent with post-operative state. Left  ventricular ejection fraction, by estimation, is 50 to 55%. The left  ventricle has low normal function. The left ventricle demonstrates  regional wall motion abnormalities (see scoring  diagram/findings for description). Left ventricular diastolic parameters  are consistent with Grade I diastolic dysfunction (impaired relaxation).  2. Right ventricular systolic function is normal. The right ventricular  size is normal.  3. The mitral valve is normal in structure. Trivial mitral valve  regurgitation. No evidence of mitral stenosis.  4. The aortic valve is tricuspid. Aortic valve regurgitation is not  visualized. No aortic stenosis is present.  5. The inferior vena cava is normal in size with greater than 50%  respiratory variability, suggesting right atrial pressure of 3 mmHg.   Cath 07/18/20:   1st Diag lesion is 90% stenosed.  Prox Cx lesion is 75% stenosed.  Prox LAD-1 lesion is 100% stenosed.  Prox LAD-2 lesion is 95% stenosed with 95% stenosed side branch in 1st Diag.  Post intervention, there is a 95% residual stenosis.  Post intervention, there is a 95% residual stenosis.  Post intervention, the side branch was reduced to 95% residual stenosis.  1. Acute occlusion of the proximal LAD at site of very complex bifurcation lesion. Successful  restoration of antegrade flow with POBA into the first diagonal branch. Unable to cross the lesion in the LAD distal to the diagonal with a wire.   Plan: emergent CABG.   Diagnostic Dominance: Right    Intervention      TTE 07/17/20: IMPRESSIONS  1. Left ventricular ejection fraction, by estimation, is 55 to 60%. The  left ventricle has normal function. The left ventricle has no regional  wall motion abnormalities. There is moderate asymmetric left ventricular  hypertrophy. Left ventricular  diastolic parameters are consistent with Grade I diastolic dysfunction  (impaired relaxation).  2. Right ventricular systolic function is normal. The right ventricular  size is normal.  3. The mitral valve is grossly normal. No evidence of mitral valve  regurgitation.  4. The aortic valve was not well visualized. Aortic valve regurgitation  is not visualized. No aortic stenosis is present.   Comparison(s): No prior Echocardiogram.  Conclusion(s)/Recommendation(s): Normal biventricular function without  evidence of hemodynamically significant valvular heart disease.   FINDINGS  Left Ventricle: Left ventricular ejection fraction, by estimation, is 55  to 60%. The left ventricle has normal function. The left ventricle has no  regional wall motion abnormalities. The left ventricular internal cavity  size was small. There is moderate  asymmetric left ventricular hypertrophy. Left ventricular diastolic  parameters are consistent with Grade I diastolic dysfunction (impaired  relaxation).   Right Ventricle: The right ventricular size is normal. No increase in  right ventricular wall thickness. Right ventricular systolic function is  normal.   Left Atrium: Left atrial size was normal in size.   Right Atrium: Right atrial size was normal in size.   Pericardium: There is no evidence of pericardial effusion.   Mitral Valve: The mitral valve is grossly normal. No evidence  of mitral  valve regurgitation.   Tricuspid Valve: The tricuspid valve is grossly normal. Tricuspid valve  regurgitation is not demonstrated.   Aortic Valve: The aortic valve was not well visualized. Aortic valve  regurgitation is not visualized. No aortic stenosis is present.   Pulmonic Valve: The pulmonic valve was grossly normal. Pulmonic valve  regurgitation is not visualized.   Aorta: The aortic root and ascending aorta are structurally normal, with  no evidence of dilitation.   IAS/Shunts: The atrial septum is grossly normal.   07/18/20: CABG Op note: Procedure:  1. EmergentMedian Sternotomy 2. Extracorporeal circulation 3. Coronary artery bypass grafting x 3   Saphenous veingraft to the LAD  Sequential SVG to diagonal and OM.   4. Endoscopic vein harvest from the rightleg  EKG:  EKG 08/09/20: NSR with anteroseptal infarct, TWI in I and aVL  Recent Labs: 07/16/2020: ALT 30 07/17/2020: TSH 2.699 07/19/2020: Magnesium 2.3 08/09/2020: BUN 16; Creatinine, Ser 0.90; Hemoglobin 10.5; Platelets 332; Potassium 4.3; Sodium 139  Recent Lipid Panel    Component Value Date/Time   CHOL 254 (H) 07/17/2020 1015   TRIG 153 (H) 07/17/2020 1015   HDL 40 (L) 07/17/2020 1015   CHOLHDL 6.4 07/17/2020 1015   VLDL 31 07/17/2020 1015   LDLCALC 183 (H) 07/17/2020 1015     Physical Exam:    VS:  BP 120/68   Pulse 90   Ht 5' (1.524 m)   Wt 143 lb 9.6 oz (65.1 kg)   SpO2 96%   BMI 28.04 kg/m     Wt Readings from Last 3 Encounters:  08/30/20 143 lb 9.6 oz (65.1 kg)  08/17/20 144 lb 12.8 oz (65.7 kg)  08/07/20 146 lb (66.2 kg)     GEN:  Well nourished, well developed in no acute distress HEENT: Normal NECK: No JVD; No carotid bruits CARDIAC: RRR, 2/6 systolic murmur, no rubs or gallops RESPIRATORY:  Clear to auscultation without rales, wheezing or rhonchi  ABDOMEN: Soft, non-tender, non-distended MUSCULOSKELETAL:  No edema; No deformity  SKIN: Warm and  dry NEUROLOGIC:  Alert and oriented x 3 PSYCHIATRIC:  Normal affect   ASSESSMENT:    1. NSTEMI (non-ST elevated myocardial infarction) (HCC)   2. Coronary artery disease involving native coronary artery of native heart without angina pectoris   3. S/P CABG x 3    PLAN:    In order of problems listed above:  #NSTEMI secondary to newly occluded SVG-to-LAD graft #Multivessel CAD s/p CABG on 07/18/20 withSVG to LAD, sequential SVG to Diag and OM Patient with recentadmission forNSTEMI found to have multivessel CAD on cath on 12/12/21prompting emergent CABG  on 07/18/20 as detailed above. LIMA deemed not a suitable bypass conduit due to small size. TTE on 07/17/20 with preserved LVEF 55-60%, no WMA, normal RV function. Post-operatively, she required inotropic support initially but was weaned off and was able to go home on 07/25/20. She was doing well until 01/02 when she developed acute onset back pain radiating to her chest. She presented to the ED where trop up-trended from 30-->500 and ECG with new TWI in the lateral leads (chronic q waves in V1-V3). Underwent coronary angiography which revealed occluded SVG-LAD graft. Suspect occlusion may have contributed by the fact that the LAD is supplied by the diagonal vessel which is grafted and there is TIMI-3 flow down the LAD system. Given concern for significant thrombus in the SVG-LAD graft and TIMI 3 flow down the LAD from SVG-Diag graft, the decision was made to pursue medical management at this time. If fails, can attempt to intervene on proximal, native LAD lesion. -Continue aggressive medical management with ASA, brilinta for at least 12 months -Continue imdur 30mg  daily -Continue atorvastatin 80mg  daily -Continue metop 25mg  XL daily -Start losartan 25mg  daily; repeat BMET in 1 week -If fails medical management, can pursue intervention on native LAD -Cleared for cardiac rehab  #HLD: -Continue atorvastatin 80mg  daily   Medication  Adjustments/Labs and Tests Ordered: Current medicines are reviewed at length with the patient today.  Concerns regarding medicines are outlined above.  Orders Placed This Encounter  Procedures  . Basic metabolic panel   Meds ordered this encounter  Medications  . losartan (COZAAR) 25 MG tablet    Sig: Take 1 tablet (25 mg total) by mouth daily.    Dispense:  90 tablet    Refill:  3  . aspirin 81 MG EC tablet    Sig: Take 1 tablet (81 mg total) by mouth daily. Swallow whole.    Dispense:  30 tablet    Refill:  11  . isosorbide mononitrate (IMDUR) 30 MG 24 hr tablet    Sig: Take 1 tablet (30 mg total) by mouth daily.    Dispense:  90 tablet    Refill:  3  . metoprolol succinate (TOPROL-XL) 25 MG 24 hr tablet    Sig: Take 0.5 tablets (12.5 mg total) by mouth daily.    Dispense:  90 tablet    Refill:  3  . ticagrelor (BRILINTA) 90 MG TABS tablet    Sig: Take 1 tablet (90 mg total) by mouth 2 (two) times daily.    Dispense:  180 tablet    Refill:  3    Patient Instructions  Medication Instructions:  Your physician has recommended you make the following change in your medication:  1.  START Losartan 25 mg taking 1 daily   I have sent in refills for Aspirin, Metoprolol, Imdur, & [facility]il[facility]  *If you need a refill on your cardiac medications before your next appointment, please call your pharmacy*   Lab Work: 09/07/2020:  Come to the office, anytime after 7:30 a.m for BMET  If you have labs (blood work) drawn today and your tests are completely normal, you will receive your results only by: MyChart Message (if you have MyChart) OR . A paper copy in the mail If you have any lab test that is abnormal or we need to change your treatment, we will call you to review the results.   Testing/Procedures: None ordered   Follow-Up: At [facility], you and your health needs are our  priority.  As part of our continuing mission to provide you with exceptional heart care, we  have created designated Provider Care Teams.  These Care Teams include your primary Cardiologist (physician) and Advanced Practice Providers (APPs -  Physician Assistants and Nurse Practitioners) who all work together to provide you with the care you need, when you need it.  We recommend signing up for the patient portal called "MyChart".  Sign up information is provided on this After Visit Summary.  MyChart is used to connect with patients for Virtual Visits (Telemedicine).  Patients are able to view lab/test results, encounter notes, upcoming appointments, etc.  Non-urgent messages can be sent to your provider as well.   To learn more about what you can do with MyChart, go to [email or web address].    Your next appointment:   6 month(s)  The format for your next appointment:   In Person  Provider:   Laurance FlattenHeather Kayleen Alig, MD   Other Instructions      Signed, Meriam SpragueHeather E Willine Schwalbe, MD  08/30/2020 10:26 AM    [facility]

## 2020-08-17 ENCOUNTER — Ambulatory Visit (INDEPENDENT_AMBULATORY_CARE_PROVIDER_SITE_OTHER): Payer: Self-pay | Admitting: Surgery

## 2020-08-17 ENCOUNTER — Ambulatory Visit
Admission: RE | Admit: 2020-08-17 | Discharge: 2020-08-17 | Disposition: A | Discharge status: Home / Self Care | Payer: Medicare HMO | Source: Ambulatory Visit | Attending: Surgery | Admitting: Surgery

## 2020-08-17 ENCOUNTER — Telehealth: Payer: Self-pay | Admitting: *Deleted

## 2020-08-17 ENCOUNTER — Other Ambulatory Visit: Payer: Self-pay

## 2020-08-17 ENCOUNTER — Encounter: Payer: Self-pay | Admitting: Surgery

## 2020-08-17 VITALS — BP 141/84 | HR 95 | Resp 20 | Ht 60.0 in | Wt 144.8 lb

## 2020-08-17 DIAGNOSIS — Z951 Presence of aortocoronary bypass graft: Secondary | ICD-10-CM | POA: Diagnosis not present

## 2020-08-17 NOTE — Progress Notes (Signed)
HPI: Patient returns for routine postoperative follow-up having undergone emergent coronary artery bypass graft surgery x3 on 07/18/2020. The patient's early postoperative recovery while in the hospital was notable for an uncomplicated postoperative course. Since hospital discharge she was readmitted with chest pain on 08/07/2020.  Her initial troponin was 31 and increased to 101.  She underwent cardiac catheterization which showed the vein graft to her LAD was occluded.  The sequential saphenous vein graft to the diagonal and obtuse marginal branch was patent with collateral filling of the LAD.  The right coronary artery was widely patent without disease.  She had no further chest pain after admission and was discharged home on 08/09/2020.  She says that since she went home she has been feeling well without chest pain or shortness of breath.  She has been ambulating short distances without difficulty.   Current Outpatient Medications  Medication Sig Dispense Refill  . acetaminophen (TYLENOL) 325 MG tablet Take 325-650 mg by mouth every 6 (six) hours as needed for mild pain (or headaches).    Marland Kitchen aspirin 81 MG EC tablet Take 1 tablet (81 mg total) by mouth daily. Swallow whole. 30 tablet 0  . atorvastatin (LIPITOR) 80 MG tablet Take 1 tablet (80 mg total) by mouth daily. 30 tablet 1  . ferrous fumarate-b12-vitamic C-folic acid (TRINSICON / FOLTRIN) capsule Take 1 capsule by mouth 2 (two) times daily after a meal. 60 capsule 1  . isosorbide mononitrate (IMDUR) 30 MG 24 hr tablet Take 1 tablet (30 mg total) by mouth daily. 30 tablet 0  . metoprolol succinate (TOPROL-XL) 25 MG 24 hr tablet Take 0.5 tablets (12.5 mg total) by mouth daily. 30 tablet 0  . nitroGLYCERIN (NITROSTAT) 0.4 MG SL tablet Place 1 tablet (0.4 mg total) under the tongue every 5 (five) minutes x 3 doses as needed for chest pain. 25 tablet 0  . ticagrelor (BRILINTA) 90 MG TABS tablet Take 1 tablet (90 mg total) by mouth 2 (two) times  daily. 60 tablet 0  . Colchicine (MITIGARE) 0.6 MG CAPS Take 0.6 mg by mouth 2 (two) times daily as needed for up to 3 days (For symptoms of gout). 6 capsule 0   No current facility-administered medications for this visit.    Physical Exam: BP (!) 141/84 (BP Location: Right Arm, Patient Position: Sitting)   Pulse 95   Resp 20   Ht 5' (1.524 m)   Wt 144 lb 12.8 oz (65.7 kg)   SpO2 97% Comment: RA with mask on  BMI 28.28 kg/m  She looks well. Cardiac exam shows a regular rate and rhythm with normal heart sounds.  There is no murmur. Lung exam is clear. The chest incision is healing well and the sternum is stable. The vein harvest incision is healing well and there is no peripheral edema.  Diagnostic Tests:  Narrative & Impression  CLINICAL DATA:  Status post coronary artery bypass grafting  EXAM: CHEST - 2 VIEW  COMPARISON:  July 23, 2020  FINDINGS: Lungs are clear. Heart size and pulmonary vascularity normal. Patient is status post coronary artery bypass grafting. No adenopathy. No pneumothorax. No bone lesions.  IMPRESSION: Status post coronary artery bypass grafting. Lungs clear. Cardiac silhouette within normal limits.   Electronically Signed   By: Bretta Bang III M.D.   On: 08/17/2020 10:38      Impression:  She is doing well at this time after emergent coronary bypass surgery and subsequent occlusion of the vein graft to  the LAD.  The left internal mammary artery was not used due to its small size and suboptimal blood flow.  At the time of surgery the LAD had ecchymosis surrounding the midportion and may have had some dissection or intimal disruption from attempts to open the occluded vessel. The recent catheterization shows some retrograde flow to the LAD through the diagonal branch but it appears relatively small and it does not appear that the distal LAD fills.  The size of the vein graft is much larger than the LAD itself.  Her ejection  fraction on echocardiogram from 08/08/2020 was 50 to 55%.  I encouraged her to continue ambulating as much as possible.  I do not think there is any reason to restrict her activity other than the usual recommendation not to lift anything heavier than 10 pounds for 3 months postoperatively.  I told her she can return to driving a car when she feels comfortable with that.  Her daughter is with her today and I answered all of their questions.  Plan:  She will continue to follow-up with Dr. Shari Prows and will return to see me if she has any problems with her incisions.   Alleen Borne, MD [facility] [phone]

## 2020-08-23 ENCOUNTER — Telehealth: Payer: Self-pay | Admitting: Cardiology

## 2020-08-23 MED ORDER — ATORVASTATIN CALCIUM 80 MG PO TABS: 80.0000 mg | ORAL_TABLET | Freq: Every day | ORAL | 3 refills | Status: DC

## 2020-08-23 NOTE — Telephone Encounter (Signed)
°*  STAT* If patient is at the pharmacy, call can be transferred to refill team.   1. Which medications need to be refilled? (please list name of each medication and dose if known) Atorvastatin  2. which pharmacy/location (including street and city if local pharmacy) is medication to be sent to? [facility] [address], [place]  3. Do they need a 30 day or 90 day supply? 90 days and refills

## 2020-08-30 ENCOUNTER — Encounter: Payer: Self-pay | Admitting: Cardiology

## 2020-08-30 ENCOUNTER — Other Ambulatory Visit: Payer: Self-pay

## 2020-08-30 ENCOUNTER — Ambulatory Visit: Payer: Medicare HMO | Admitting: Cardiology

## 2020-08-30 VITALS — BP 120/68 | HR 90 | Ht 60.0 in | Wt 143.6 lb

## 2020-08-30 DIAGNOSIS — I214 Non-ST elevation (NSTEMI) myocardial infarction: Secondary | ICD-10-CM | POA: Diagnosis not present

## 2020-08-30 DIAGNOSIS — I2510 Atherosclerotic heart disease of native coronary artery without angina pectoris: Secondary | ICD-10-CM

## 2020-08-30 DIAGNOSIS — Z951 Presence of aortocoronary bypass graft: Secondary | ICD-10-CM | POA: Diagnosis not present

## 2020-08-30 MED ORDER — TICAGRELOR 90 MG PO TABS: 90.0000 mg | ORAL_TABLET | Freq: Two times a day (BID) | ORAL | 3 refills | Status: DC

## 2020-08-30 MED ORDER — ASPIRIN 81 MG PO TBEC: 81.0000 mg | DELAYED_RELEASE_TABLET | Freq: Every day | ORAL | 11 refills | Status: DC

## 2020-08-30 MED ORDER — ISOSORBIDE MONONITRATE ER 30 MG PO TB24: 30.0000 mg | ORAL_TABLET | Freq: Every day | ORAL | 3 refills | Status: DC

## 2020-08-30 MED ORDER — LOSARTAN POTASSIUM 25 MG PO TABS: 25.0000 mg | ORAL_TABLET | Freq: Every day | ORAL | 3 refills | Status: DC

## 2020-08-30 MED ORDER — METOPROLOL SUCCINATE ER 25 MG PO TB24: 12.5000 mg | ORAL_TABLET | Freq: Every day | ORAL | 3 refills | Status: DC

## 2020-08-30 NOTE — Patient Instructions (Addendum)
Medication Instructions:  Your physician has recommended you make the following change in your medication:  1.  START Losartan 25 mg taking 1 daily   I have sent in refills for Aspirin, Metoprolol, Imdur, & [facility]  *If you need a refill on your cardiac medications before your next appointment, please call your pharmacy*   Lab Work: 09/07/2020:  Come to the office, anytime after 7:30 a.m for BMET  If you have labs (blood work) drawn today and your tests are completely normal, you will receive your results only by: Marland Kitchen MyChart Message (if you have MyChart) OR . A paper copy in the mail If you have any lab test that is abnormal or we need to change your treatment, we will call you to review the results.   Testing/Procedures: None ordered   Follow-Up: At [facility], you and your health needs are our priority.  As part of our continuing mission to provide you with exceptional heart care, we have created designated Provider Care Teams.  These Care Teams include your primary Cardiologist (physician) and Advanced Practice Providers (APPs -  Physician Assistants and Nurse Practitioners) who all work together to provide you with the care you need, when you need it.  We recommend signing up for the patient portal called "MyChart".  Sign up information is provided on this After Visit Summary.  MyChart is used to connect with patients for Virtual Visits (Telemedicine).  Patients are able to view lab/test results, encounter notes, upcoming appointments, etc.  Non-urgent messages can be sent to your provider as well.   To learn more about what you can do with MyChart, go to [email or web address].    Your next appointment:   6 month(s)  The format for your next appointment:   In Person  Provider:   Laurance Flatten, MD   Other Instructions

## 2020-08-31 ENCOUNTER — Telehealth (HOSPITAL_COMMUNITY): Payer: Self-pay

## 2020-08-31 NOTE — Telephone Encounter (Signed)
Patient called and was interested in participating in the Cardiac Rehab Program. Patient stated yes. Patient will come in for orientation on 09/27/2020@10 :00am and will attend the 10:45am exercise class.  Pensions consultant.

## 2020-09-07 ENCOUNTER — Other Ambulatory Visit: Payer: Self-pay

## 2020-09-07 ENCOUNTER — Other Ambulatory Visit: Payer: Medicare HMO | Admitting: *Deleted

## 2020-09-07 DIAGNOSIS — I214 Non-ST elevation (NSTEMI) myocardial infarction: Secondary | ICD-10-CM

## 2020-09-07 DIAGNOSIS — Z951 Presence of aortocoronary bypass graft: Secondary | ICD-10-CM

## 2020-09-07 DIAGNOSIS — I2510 Atherosclerotic heart disease of native coronary artery without angina pectoris: Secondary | ICD-10-CM

## 2020-09-07 LAB — BASIC METABOLIC PANEL
BUN/Creatinine Ratio: 16 (ref 12–28)
BUN: 15 mg/dL (ref 8–27)
CO2: 22 mmol/L (ref 20–29)
Calcium: 9.3 mg/dL (ref 8.7–10.3)
Chloride: 104 mmol/L (ref 96–106)
Creatinine, Ser: 0.91 mg/dL (ref 0.57–1.00)
GFR calc Af Amer: 73 mL/min/{1.73_m2} (ref >59)
GFR calc non Af Amer: 63 mL/min/{1.73_m2} (ref >59)
Glucose: 96 mg/dL (ref 65–99)
Potassium: 4.4 mmol/L (ref 3.5–5.2)
Sodium: 141 mmol/L (ref 134–144)

## 2020-09-14 ENCOUNTER — Other Ambulatory Visit: Payer: Self-pay | Admitting: Physician Assistant

## 2020-09-23 ENCOUNTER — Telehealth: Payer: Self-pay | Admitting: Cardiology

## 2020-09-23 ENCOUNTER — Telehealth (HOSPITAL_COMMUNITY): Payer: Self-pay | Admitting: *Deleted

## 2020-09-23 MED ORDER — FE FUMARATE-B12-VIT C-FA-IFC PO CAPS: 1.0000 | ORAL_CAPSULE | Freq: Two times a day (BID) | ORAL | 1 refills | Status: DC

## 2020-09-23 NOTE — Telephone Encounter (Signed)
Pt has been made aware that her refill for Trinsicon[facility]Foltrin [facility]. She was appreciative for the call.

## 2020-09-23 NOTE — Telephone Encounter (Signed)
*  STAT* If patient is at the pharmacy, call can be transferred to refill team.   1. Which medications need to be refilled? (please list name of each medication and dose if known) ferrous fumarate-b12-vitamic C-folic acid (TRINSICON / FOLTRIN) capsule  2. Which pharmacy/location (including street and city if local pharmacy) is medication to be sent to? [id number] [place], [place] - [address]  3. Do they need a 30 day or 90 day supply? 30 day supply   Patient does not have enough medication to last through weekend.

## 2020-09-23 NOTE — Telephone Encounter (Signed)
Left message to call cardiac rehab.Gladstone Lighter, RN,BSN 09/23/2020 10:25 AM

## 2020-09-25 NOTE — Telephone Encounter (Signed)
Cardiac Rehab Medication Review by a Pharmacist  Does the patient  feel that his/her medications are working for him/her?  yes  Has the patient been experiencing any side effects to the medications prescribed?  yes Mild breathing changes with Brilinta, goes away if she takes it with caffeine.  Does the patient measure his/her own blood pressure or blood glucose at home?  yes  Most recent BP 109/59  Does the patient have any problems obtaining medications due to transportation or finances?   no  Understanding of regimen: good Understanding of indications: good Potential of compliance: good    Pharmacist Intervention: N/A  Lamar Sprinkles, PharmD PGY1 Pharmacy Resident 09/26/2020 6:32 PM

## 2020-09-27 ENCOUNTER — Encounter (HOSPITAL_COMMUNITY): Payer: Self-pay

## 2020-09-27 ENCOUNTER — Encounter (HOSPITAL_COMMUNITY)
Admission: RE | Admit: 2020-09-27 | Discharge: 2020-09-27 | Disposition: A | Discharge status: Home / Self Care | Payer: Medicare HMO | Source: Ambulatory Visit | Attending: Cardiology | Admitting: Cardiology

## 2020-09-27 ENCOUNTER — Other Ambulatory Visit: Payer: Self-pay

## 2020-09-27 VITALS — BP 102/68 | HR 80 | Ht 59.75 in | Wt 142.6 lb

## 2020-09-27 DIAGNOSIS — I214 Non-ST elevation (NSTEMI) myocardial infarction: Secondary | ICD-10-CM | POA: Insufficient documentation

## 2020-09-27 HISTORY — DX: Essential (primary) hypertension: I10

## 2020-09-27 NOTE — Progress Notes (Signed)
Cardiac Individual Treatment Plan  Patient Details  Name: Brenda Wilkins MRN: [id number] Date of Birth: May 09, 1946 Referring Provider:   Flowsheet Row CARDIAC REHAB PHASE II ORIENTATION from 09/27/2020 in [facility] CARDIAC REHAB  Referring Provider Meriam Sprague, MD      Initial Encounter Date:  Flowsheet Row CARDIAC REHAB PHASE II ORIENTATION from 09/27/2020 in [facility] CARDIAC REHAB  Date 09/27/20      Visit Diagnosis: 08/07/2020 NSTEMI  Patient's Home Medications on Admission:  Current Outpatient Medications:  .  acetaminophen (TYLENOL) 325 MG tablet, Take 325-650 mg by mouth every 6 (six) hours as needed for mild pain (or headaches)., Disp: , Rfl:  .  aspirin 81 MG EC tablet, Take 1 tablet (81 mg total) by mouth daily. Swallow whole., Disp: 30 tablet, Rfl: 11 .  atorvastatin (LIPITOR) 80 MG tablet, Take 1 tablet (80 mg total) by mouth daily., Disp: 90 tablet, Rfl: 3 .  colchicine 0.6 MG tablet, Take 0.6 mg by mouth 2 (two) times daily as needed (for gout). (Patient not taking: Reported on 09/26/2020), Disp: , Rfl:  .  ferrous fumarate-b12-vitamic C-folic acid (TRINSICON / FOLTRIN) capsule, Take 1 capsule by mouth 2 (two) times daily after a meal., Disp: 60 capsule, Rfl: 1 .  isosorbide mononitrate (IMDUR) 30 MG 24 hr tablet, Take 1 tablet (30 mg total) by mouth daily., Disp: 90 tablet, Rfl: 3 .  losartan (COZAAR) 25 MG tablet, Take 1 tablet (25 mg total) by mouth daily., Disp: 90 tablet, Rfl: 3 .  metoprolol succinate (TOPROL-XL) 25 MG 24 hr tablet, Take 0.5 tablets (12.5 mg total) by mouth daily., Disp: 90 tablet, Rfl: 3 .  nitroGLYCERIN (NITROSTAT) 0.4 MG SL tablet, Place 1 tablet (0.4 mg total) under the tongue every 5 (five) minutes x 3 doses as needed for chest pain., Disp: 25 tablet, Rfl: 0 .  ticagrelor (BRILINTA) 90 MG TABS tablet, Take 1 tablet (90 mg total) by mouth 2 (two) times daily., Disp: 180 tablet, Rfl: 3  Past Medical  History: Past Medical History:  Diagnosis Date  . Coronary artery disease   . Hyperlipidemia   . Hypertension   . NSTEMI (non-ST elevated myocardial infarction) (HCC) 07/16/2020  . 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    Tobacco Use: Social History   Tobacco Use  Smoking Status Never Smoker  Smokeless Tobacco Never Used    Labs: Recent Review Flowsheet Data    Labs for ITP Cardiac and Pulmonary Rehab Latest Ref Rng & Units 07/18/2020 07/19/2020 07/19/2020 07/19/2020 07/19/2020   Cholestrol 0 - 200 mg/dL - - - - -   LDLCALC 0 - 99 mg/dL - - - - -   HDL >34 mg/dL - - - - -   Trlycerides <150 mg/dL - - - - -   Hemoglobin A1c 4.8 - 5.6 % - - - - -   PHART 7.350 - 7.450 7.350 7.372 7.376 7.387 7.353   PCO2ART 32.0 - 48.0 mmHg 50.4(H) 48.5(H) 45.1 42.2 44.8   HCO3 20.0 - 28.0 mmol/L 27.5 28.0 26.2 25.2 24.7   TCO2 22 - 32 mmol/L 29 29 27 26 26    ACIDBASEDEF 0.0 - 2.0 mmol/L - - - - 1.0   O2SAT % 100.0 98.0 98.0 97.0 96.0      Capillary Blood Glucose: Lab Results  Component Value Date   GLUCAP 89 07/21/2020   GLUCAP 95 07/21/2020   GLUCAP 100 (H) 07/21/2020   GLUCAP 93 07/21/2020   GLUCAP  104 (H) 07/20/2020     Exercise Target Goals: Exercise Program Goal: Individual exercise prescription set using results from initial 6 min walk test and THRR while considering  patient's activity barriers and safety.   Exercise Prescription Goal: Starting with aerobic activity 30 plus minutes a day, 3 days per week for initial exercise prescription. Provide home exercise prescription and guidelines that participant acknowledges understanding prior to discharge.  Activity Barriers & Risk Stratification:  Activity Barriers & Cardiac Risk Stratification - 09/27/20 1022      Activity Barriers & Cardiac Risk Stratification   Activity Barriers History of Falls    Cardiac Risk Stratification High           6 Minute Walk:  6 Minute Walk    Row Name 09/27/20 1051         6 Minute Walk    Phase Initial     Distance 1107 feet     Walk Time 6 minutes     # of Rest Breaks 0     MPH 2.1     METS 2.36     RPE 9     Perceived Dyspnea  0     VO2 Peak 8.27     Symptoms No     Resting HR 80 bpm     Resting BP 102/68     Resting Oxygen Saturation  98 %     Exercise Oxygen Saturation  during 6 min walk 99 %     Max Ex. HR 111 bpm     Max Ex. BP 128/74     2 Minute Post BP 110/69            Oxygen Initial Assessment:   Oxygen Re-Evaluation:   Oxygen Discharge (Final Oxygen Re-Evaluation):   Initial Exercise Prescription:  Initial Exercise Prescription - 09/27/20 1100      Date of Initial Exercise RX and Referring Provider   Date 09/27/20    Referring Provider Meriam Sprague, MD    Expected Discharge Date 11/25/20      NuStep   Level 1    SPM 85    Minutes 15    METs 2.3      Track   Laps 12    Minutes 15    METs 2.39      Prescription Details   Frequency (times per week) 3    Duration Progress to 30 minutes of continuous aerobic without signs/symptoms of physical distress      Intensity   THRR 40-80% of Max Heartrate 59-118    Ratings of Perceived Exertion 11-13    Perceived Dyspnea 0-4      Progression   Progression Continue to progress workloads to maintain intensity without signs/symptoms of physical distress.      Resistance Training   Training Prescription Yes    Weight 2 lbs    Reps 10-15           Perform Capillary Blood Glucose checks as needed.  Exercise Prescription Changes:   Exercise Comments:   Exercise Goals and Review:  Exercise Goals    Row Name 09/27/20 1033             Exercise Goals   Increase Physical Activity Yes       Intervention Provide advice, education, support and counseling about physical activity/exercise needs.;Develop an individualized exercise prescription for aerobic and resistive training based on initial evaluation findings, risk stratification, comorbidities and participant's  personal goals.  Expected Outcomes Short Term: Attend rehab on a regular basis to increase amount of physical activity.;Long Term: Add in home exercise to make exercise part of routine and to increase amount of physical activity.;Long Term: Exercising regularly at least 3-5 days a week.       Increase Strength and Stamina Yes       Intervention Provide advice, education, support and counseling about physical activity/exercise needs.;Develop an individualized exercise prescription for aerobic and resistive training based on initial evaluation findings, risk stratification, comorbidities and participant's personal goals.       Expected Outcomes Short Term: Increase workloads from initial exercise prescription for resistance, speed, and METs.;Long Term: Improve cardiorespiratory fitness, muscular endurance and strength as measured by increased METs and functional capacity ( );Short Term: Perform resistance training exercises routinely during rehab and add in resistance training at home       Able to understand and use rate of perceived exertion (RPE) scale Yes       Intervention Provide education and explanation on how to use RPE scale       Expected Outcomes Short Term: Able to use RPE daily in rehab to express subjective intensity level;Long Term:  Able to use RPE to guide intensity level when exercising independently       Knowledge and understanding of Target Heart Rate Range (THRR) Yes       Intervention Provide education and explanation of THRR including how the numbers were predicted and where they are located for reference       Expected Outcomes Short Term: Able to state/look up THRR;Long Term: Able to use THRR to govern intensity when exercising independently;Short Term: Able to use daily as guideline for intensity in rehab       Able to check pulse independently Yes       Intervention Provide education and demonstration on how to check pulse in carotid and radial arteries.;Review the  importance of being able to check your own pulse for safety during independent exercise       Expected Outcomes Short Term: Able to explain why pulse checking is important during independent exercise;Long Term: Able to check pulse independently and accurately       Understanding of Exercise Prescription Yes       Intervention Provide education, explanation, and written materials on patient's individual exercise prescription       Expected Outcomes Short Term: Able to explain program exercise prescription;Long Term: Able to explain home exercise prescription to exercise independently              Exercise Goals Re-Evaluation :    Discharge Exercise Prescription (Final Exercise Prescription Changes):   Nutrition:  Target Goals: Understanding of nutrition guidelines, daily intake of sodium 1500mg , cholesterol 200mg , calories 30% from fat and 7% or less from saturated fats, daily to have 5 or more servings of fruits and vegetables.  Biometrics:  Pre Biometrics - 09/27/20 1023      Pre Biometrics   Waist Circumference 36.75 inches    Hip Circumference 39.75 inches    Waist to Hip Ratio 0.92 %    Triceps Skinfold 26 mm    % Body Fat 40.5 %    Grip Strength 25.5 kg    Flexibility 14.63 in    Single Leg Stand 25.93 seconds            Nutrition Therapy Plan and Nutrition Goals:   Nutrition Assessments:  MEDIFICTS Score Key:  ?70 Need to make dietary changes  40-70 Heart Healthy Diet  ? 40 Therapeutic Level Cholesterol Diet   Picture Your Plate Scores:  <16<40 Unhealthy dietary pattern with much room for improvement.  41-50 Dietary pattern unlikely to meet recommendations for good health and room for improvement.  51-60 More healthful dietary pattern, with some room for improvement.   >60 Healthy dietary pattern, although there may be some specific behaviors that could be improved.    Nutrition Goals Re-Evaluation:   Nutrition Goals Discharge (Final Nutrition  Goals Re-Evaluation):   Psychosocial: Target Goals: Acknowledge presence or absence of significant depression and/or stress, maximize coping skills, provide positive support system. Participant is able to verbalize types and ability to use techniques and skills needed for reducing stress and depression.  Initial Review & Psychosocial Screening:  Initial Psych Review & Screening - 09/27/20 1129      Initial Review   Current issues with Current Stress Concerns    Source of Stress Concerns Chronic Illness    Comments Debby says she had some stress when she initially had her open heart surgery and MI. Debby says that she is better now      [facility]   [facility] Support System? Yes   Debby lives alone but has her son and daughter for support.     Barriers   Psychosocial barriers to participate in program The patient should benefit from training in stress management and relaxation.      Screening Interventions   Interventions Encouraged to exercise    Expected Outcomes Long Term Goal: Stressors or current issues are controlled or eliminated.           Quality of Life Scores:  Quality of Life - 09/27/20 1151      Quality of Life   Select Quality of Life      Quality of Life Scores   Health/Function Pre 26.65 %    Socioeconomic Pre 27.36 %    Psych/Spiritual Pre 28.79 %    Family Pre 30 %    GLOBAL Pre 27.73 %          Scores of 19 and below usually indicate a poorer quality of life in these areas.  A difference of  2-3 points is a clinically meaningful difference.  A difference of 2-3 points in the total score of the Quality of Life Index has been associated with significant improvement in overall quality of life, self-image, physical symptoms, and general health in studies assessing change in quality of life.  PHQ-9: Recent Review Flowsheet Data    Depression screen [facility] 09/[facility]/2020   Decreased Interest 0   Down, Depressed, Hopeless 0   PHQ - 2 Score 0      Interpretation of Total Score  Total Score Depression Severity:  1-4 = Minimal depression, 5-9 = Mild depression, 10-14 = Moderate depression, 15-19 = Moderately severe depression, 20-27 = Severe depression   Psychosocial Evaluation and Intervention:   Psychosocial Re-Evaluation:   Psychosocial Discharge (Final Psychosocial Re-Evaluation):   Vocational Rehabilitation: Provide vocational rehab assistance to qualifying candidates.   Vocational Rehab Evaluation & Intervention:  Vocational Rehab - 09/27/20 1135      Initial Vocational Rehab Evaluation & Intervention   Assessment shows need for Vocational Rehabilitation No   Debby works full time and does not need vocational rehab at this time.          Education: Education Goals: Education classes will be provided on a weekly basis, covering required topics. Participant will state understanding/return demonstration of  topics presented.  Learning Barriers/Preferences:  Learning Barriers/Preferences - 09/27/20 1133      Learning Barriers/Preferences   Learning Barriers Sight   wears contacts and glasses   Learning Preferences Audio;Computer/Internet;Group Instruction;Individual Instruction;Pictoral;Skilled Demonstration;Verbal Instruction;Video;Written Material           Education Topics: Hypertension, Hypertension Reduction -Define heart disease and high blood pressure. Discus how high blood pressure affects the body and ways to reduce high blood pressure.   Exercise and Your Heart -Discuss why it is important to exercise, the FITT principles of exercise, normal and abnormal responses to exercise, and how to exercise safely.   Angina -Discuss definition of angina, causes of angina, treatment of angina, and how to decrease risk of having angina.   Cardiac Medications -Review what the following cardiac medications are used for, how they affect the body, and side effects that may occur when taking the medications.   Medications include Aspirin, Beta blockers, calcium channel blockers, ACE Inhibitors, angiotensin receptor blockers, diuretics, digoxin, and antihyperlipidemics.   Congestive Heart Failure -Discuss the definition of CHF, how to live with CHF, the signs and symptoms of CHF, and how keep track of weight and sodium intake.   Heart Disease and Intimacy -Discus the effect sexual activity has on the heart, how changes occur during intimacy as we age, and safety during sexual activity.   Smoking Cessation / COPD -Discuss different methods to quit smoking, the health benefits of quitting smoking, and the definition of COPD.   Nutrition I: Fats -Discuss the types of cholesterol, what cholesterol does to the heart, and how cholesterol levels can be controlled.   Nutrition II: Labels -Discuss the different components of food labels and how to read food label   Heart Parts/Heart Disease and PAD -Discuss the anatomy of the heart, the pathway of blood circulation through the heart, and these are affected by heart disease.   Stress I: Signs and Symptoms -Discuss the causes of stress, how stress may lead to anxiety and depression, and ways to limit stress.   Stress II: Relaxation -Discuss different types of relaxation techniques to limit stress.   Warning Signs of Stroke / TIA -Discuss definition of a stroke, what the signs and symptoms are of a stroke, and how to identify when someone is having stroke.   Knowledge Questionnaire Score:  Knowledge Questionnaire Score - 09/27/20 1151      Knowledge Questionnaire Score   Pre Score 22/24           Core Components/Risk Factors/Patient Goals at Admission:  Personal Goals and Risk Factors at Admission - 09/27/20 1147      Core Components/Risk Factors/Patient Goals on Admission    Weight Management Weight Maintenance;Yes    Intervention Weight Management: Develop a combined nutrition and exercise program designed to reach desired  caloric intake, while maintaining appropriate intake of nutrient and fiber, sodium and fats, and appropriate energy expenditure required for the weight goal.;Weight Management: Provide education and appropriate resources to help participant work on and attain dietary goals.    Expected Outcomes Long Term: Adherence to nutrition and physical activity/exercise program aimed toward attainment of established weight goal;Weight Maintenance: Understanding of the daily nutrition guidelines, which includes 25-35% calories from fat, 7% or less cal from saturated fats, less than 200mg  cholesterol, less than 1.5gm of sodium, & 5 or more servings of fruits and vegetables daily;Understanding of distribution of calorie intake throughout the day with the consumption of 4-5 meals/snacks    Hypertension Yes  Intervention Provide education on lifestyle modifcations including regular physical activity/exercise, weight management, moderate sodium restriction and increased consumption of fresh fruit, vegetables, and low fat dairy, alcohol moderation, and smoking cessation.;Monitor prescription use compliance.    Expected Outcomes Short Term: Continued assessment and intervention until BP is < 140/33mm HG in hypertensive participants. < 130/42mm HG in hypertensive participants with diabetes, heart failure or chronic kidney disease.;Long Term: Maintenance of blood pressure at goal levels.    Lipids Yes    Intervention Provide education and support for participant on nutrition & aerobic/resistive exercise along with prescribed medications to achieve LDL 70mg , HDL >40mg .    Expected Outcomes Short Term: Participant states understanding of desired cholesterol values and is compliant with medications prescribed. Participant is following exercise prescription and nutrition guidelines.;Long Term: Cholesterol controlled with medications as prescribed, with individualized exercise RX and with personalized nutrition plan. Value goals:  LDL < , HDL > 40 mg.    Stress Yes    Intervention Offer individual and/or small group education and counseling on adjustment to heart disease, stress management and health-related lifestyle change. Teach and support self-help strategies.;Refer participants experiencing significant psychosocial distress to appropriate mental health specialists for further evaluation and treatment. When possible, include family members and significant others in education/counseling sessions.    Expected Outcomes Short Term: Participant demonstrates changes in health-related behavior, relaxation and other stress management skills, ability to obtain effective social support, and compliance with psychotropic medications if prescribed.;Long Term: Emotional wellbeing is indicated by absence of clinically significant psychosocial distress or social isolation.           Core Components/Risk Factors/Patient Goals Review:    Core Components/Risk Factors/Patient Goals at Discharge (Final Review):    ITP Comments:  ITP Comments    Row Name 09/27/20 1032           ITP Comments Medical Director- Dr. Armanda Magic, MD              Comments: Jari Pigg attended orientation on 09/27/2020 to review rules and guidelines for program.  Completed 6 minute walk test, Intitial ITP, and exercise prescription.  VSS. Telemetry-Sinus Rhythm, tall t wave.  Asymptomatic. Safety measures and social distancing in place per CDC guidelines.See previous note as patient reported that she fell at home last Thursday.Gladstone Lighter, RN,BSN 09/27/2020 12:37 PM

## 2020-09-27 NOTE — Progress Notes (Signed)
Patient here for cardiac rehab orientation. Reports that while on a Zoom meeting last Thursday Ms Keough was getting up from a seated position and did not feel right. Debby says that she walked from the kitchen to her bedroom. Debby says that she fell to her knee's besides the bed. There is a resolving bruise to the bridge of the patient's nose and to bilateral knee's. Debby says that she had no chest pain and "felt fine after that." Orthostatic BP's checked Sitting BP 119/75 heart rate 82. Standing BP 116/74 heart rate 86. Telemetry rhythm Sinus with a tall T wave. Patient has no complaints or symptoms today at cardiac rehab orientation. Patient completed walk test with out difficulty. Vital signs stable. Debby said she did not notify Dr Devin Going office when this happened.  Vin Bhagat PA paged and notified.Vin said that Ms Constantin should be okay to proceed with exercise bit to notify PCP/ Cardiology if it happens again. Will forward to Dr Shari Prows. Ms Beilfuss does not have a primary care provider at this time. Ms Mcbrearty does have an appointment in two weeks for  a primary care provider to establish care. Ms Ramella will bring the name next week when she starts exercise.Gladstone Lighter, RN,BSN 09/27/2020 11:54 AM

## 2020-09-30 ENCOUNTER — Telehealth: Payer: Self-pay | Admitting: Cardiology

## 2020-09-30 MED ORDER — ISOSORBIDE MONONITRATE ER 60 MG PO TB24: 60.0000 mg | ORAL_TABLET | Freq: Every day | ORAL | 1 refills | Status: DC

## 2020-09-30 NOTE — Telephone Encounter (Signed)
Pt called back in returning [place] call   Best number [phone]  Pt c/o of Chest Pain: STAT if CP now or developed within 24 hours  1. Are you having CP right now? Not right now   2. Are you experiencing any other symptoms (ex. SOB, nausea, vomiting, sweating)? Pt denies any of these symptoms   3. How long have you been experiencing CP? Started yesterday afternoon   4. Is your CP continuous or coming and going? While she is walking it is Continuous .    5. Have you taken Nitroglycerin? No   Pt stated she stated feeling like this when she started walking yesterday.  After 3 or 4 mins she starts to have a pressure or a tightness in her chest .    Best number [phone] ?

## 2020-09-30 NOTE — Telephone Encounter (Signed)
Thank you so much for letting me know! Can we increase her imdur to 60mg  daily? Our goal is to increase her medications to prevent the symptoms from occurring, but if she continues to have symptoms, we can see if we need to go back in for a cath. She can continue to do cardiac rehab and walk with taking breaks if her symptoms occur. If her symptoms occur with rest and do not improve with nitroglycerin, that is a reason to go to the Emergency Room.

## 2020-09-30 NOTE — Telephone Encounter (Signed)
Spoke with pt who reports she began having chest pressure/discomfort yesterday (upper left chest that radiates to her back) only when she is walking for exercise.  Pt reports it begins about 3 minutes into walk and resolves within a few minutes of stopping to rest.  Pt denies current CP, SOB, edema, nausea or vomiting.  She is taking medications as prescribed but has not tried taking any nitro for the pain.  Pt is to begin cardiac rehab on Monday 02/28.  Pt advised will forward information to Dr Shari Prows for review and recommendation.  Reviewed ED precautions.  Pt verbalizes understanding and agrees with current plan.

## 2020-09-30 NOTE — Telephone Encounter (Signed)
New message:     Patient calling stating that she is having some tightness in her chest. Please cal patient

## 2020-09-30 NOTE — Addendum Note (Signed)
Addended by: Alois Cliche on: 09/30/2020 04:36 PM   Modules accepted: Orders

## 2020-09-30 NOTE — Telephone Encounter (Signed)
Attempted phone call to pt and left voicemail to contact any triage RN at [phone].

## 2020-09-30 NOTE — Telephone Encounter (Signed)
Spoke with pt and advised of Dr Devin Going recommendations as below.  Pt verbalizes understanding of instructions given and agrees with current plan.  Pt thanked Charity fundraiser for the callback.

## 2020-09-30 NOTE — Telephone Encounter (Signed)
Spoke with pt. Please see phone note dated 09/30/2020 for complete details.

## 2020-09-30 NOTE — Telephone Encounter (Signed)
Left patient a message to return call to office regarding chest discomfort.

## 2020-10-02 ENCOUNTER — Telehealth: Payer: Self-pay | Admitting: Physician Assistant

## 2020-10-02 ENCOUNTER — Encounter (HOSPITAL_COMMUNITY): Payer: Self-pay | Admitting: Emergency Medicine

## 2020-10-02 ENCOUNTER — Other Ambulatory Visit: Payer: Self-pay

## 2020-10-02 ENCOUNTER — Inpatient Hospital Stay (HOSPITAL_COMMUNITY)
Admission: EM | Admit: 2020-10-02 | Discharge: 2020-10-05 | DRG: 247 | Disposition: A | Discharge status: Home / Self Care | Payer: Medicare HMO | Attending: Internal Medicine | Admitting: Internal Medicine

## 2020-10-02 ENCOUNTER — Emergency Department (HOSPITAL_COMMUNITY): Payer: Medicare HMO

## 2020-10-02 DIAGNOSIS — I724 Aneurysm of artery of lower extremity: Secondary | ICD-10-CM | POA: Diagnosis not present

## 2020-10-02 DIAGNOSIS — E785 Hyperlipidemia, unspecified: Secondary | ICD-10-CM | POA: Diagnosis present

## 2020-10-02 DIAGNOSIS — R079 Chest pain, unspecified: Secondary | ICD-10-CM | POA: Diagnosis not present

## 2020-10-02 DIAGNOSIS — Z7982 Long term (current) use of aspirin: Secondary | ICD-10-CM | POA: Diagnosis not present

## 2020-10-02 DIAGNOSIS — I97630 Postprocedural hematoma of a circulatory system organ or structure following a cardiac catheterization: Secondary | ICD-10-CM | POA: Diagnosis not present

## 2020-10-02 DIAGNOSIS — Z7902 Long term (current) use of antithrombotics/antiplatelets: Secondary | ICD-10-CM

## 2020-10-02 DIAGNOSIS — Z8249 Family history of ischemic heart disease and other diseases of the circulatory system: Secondary | ICD-10-CM | POA: Diagnosis not present

## 2020-10-02 DIAGNOSIS — I200 Unstable angina: Secondary | ICD-10-CM

## 2020-10-02 DIAGNOSIS — I252 Old myocardial infarction: Secondary | ICD-10-CM | POA: Diagnosis not present

## 2020-10-02 DIAGNOSIS — Z20822 Contact with and (suspected) exposure to covid-19: Secondary | ICD-10-CM | POA: Diagnosis not present

## 2020-10-02 DIAGNOSIS — Y840 Cardiac catheterization as the cause of abnormal reaction of the patient, or of later complication, without mention of misadventure at the time of the procedure: Secondary | ICD-10-CM | POA: Diagnosis not present

## 2020-10-02 DIAGNOSIS — Z951 Presence of aortocoronary bypass graft: Secondary | ICD-10-CM

## 2020-10-02 DIAGNOSIS — I959 Hypotension, unspecified: Secondary | ICD-10-CM | POA: Diagnosis not present

## 2020-10-02 DIAGNOSIS — Z79899 Other long term (current) drug therapy: Secondary | ICD-10-CM

## 2020-10-02 DIAGNOSIS — I2582 Chronic total occlusion of coronary artery: Secondary | ICD-10-CM | POA: Diagnosis present

## 2020-10-02 DIAGNOSIS — I701 Atherosclerosis of renal artery: Secondary | ICD-10-CM | POA: Diagnosis not present

## 2020-10-02 DIAGNOSIS — I25110 Atherosclerotic heart disease of native coronary artery with unstable angina pectoris: Secondary | ICD-10-CM | POA: Diagnosis present

## 2020-10-02 DIAGNOSIS — I708 Atherosclerosis of other arteries: Secondary | ICD-10-CM | POA: Diagnosis not present

## 2020-10-02 DIAGNOSIS — I25710 Atherosclerosis of autologous vein coronary artery bypass graft(s) with unstable angina pectoris: Principal | ICD-10-CM | POA: Diagnosis present

## 2020-10-02 DIAGNOSIS — R0789 Other chest pain: Secondary | ICD-10-CM | POA: Diagnosis not present

## 2020-10-02 DIAGNOSIS — M25551 Pain in right hip: Secondary | ICD-10-CM | POA: Diagnosis not present

## 2020-10-02 DIAGNOSIS — I10 Essential (primary) hypertension: Secondary | ICD-10-CM | POA: Diagnosis not present

## 2020-10-02 DIAGNOSIS — I214 Non-ST elevation (NSTEMI) myocardial infarction: Secondary | ICD-10-CM | POA: Diagnosis present

## 2020-10-02 DIAGNOSIS — Z955 Presence of coronary angioplasty implant and graft: Secondary | ICD-10-CM

## 2020-10-02 LAB — BASIC METABOLIC PANEL
Anion gap: 11 (ref 5–15)
BUN: 15 mg/dL (ref 8–23)
CO2: 24 mmol/L (ref 22–32)
Calcium: 9 mg/dL (ref 8.9–10.3)
Chloride: 106 mmol/L (ref 98–111)
Creatinine, Ser: 0.87 mg/dL (ref 0.44–1.00)
GFR, Estimated: >60 mL/min (ref >60)
Glucose, Bld: 113 mg/dL — ABNORMAL HIGH (ref 70–99)
Potassium: 3.6 mmol/L (ref 3.5–5.1)
Sodium: 141 mmol/L (ref 135–145)

## 2020-10-02 LAB — RESP PANEL BY RT-PCR (FLU A&B, COVID) ARPGX2
Influenza A by PCR: NEGATIVE
Influenza B by PCR: NEGATIVE
SARS Coronavirus 2 by RT PCR: NEGATIVE

## 2020-10-02 LAB — CBC
HCT: 42.3 % (ref 36.0–46.0)
Hemoglobin: 13.5 g/dL (ref 12.0–15.0)
MCH: 29.2 pg (ref 26.0–34.0)
MCHC: 31.9 g/dL (ref 30.0–36.0)
MCV: 91.6 fL (ref 80.0–100.0)
Platelets: 304 10*3/uL (ref 150–400)
RBC: 4.62 MIL/uL (ref 3.87–5.11)
RDW: 13.7 % (ref 11.5–15.5)
WBC: 7.7 10*3/uL (ref 4.0–10.5)
nRBC: 0 % (ref 0.0–0.2)

## 2020-10-02 LAB — TROPONIN I (HIGH SENSITIVITY)
Troponin I (High Sensitivity): 16 ng/L (ref <18)
Troponin I (High Sensitivity): 14 ng/L (ref <18)

## 2020-10-02 MED ORDER — ASPIRIN EC 81 MG PO TBEC
81.0000 mg | DELAYED_RELEASE_TABLET | Freq: Every day | ORAL | Status: DC
  Administered 2020-10-03: 81 mg via ORAL
  Filled 2020-10-02: qty 1

## 2020-10-02 MED ORDER — TICAGRELOR 90 MG PO TABS
90.0000 mg | ORAL_TABLET | Freq: Two times a day (BID) | ORAL | Status: DC
  Administered 2020-10-02 – 2020-10-03 (×2): 90 mg via ORAL
  Filled 2020-10-02 (×2): qty 1

## 2020-10-02 MED ORDER — ISOSORBIDE MONONITRATE ER 60 MG PO TB24
60.0000 mg | ORAL_TABLET | Freq: Every day | ORAL | Status: DC
  Administered 2020-10-03: 60 mg via ORAL
  Filled 2020-10-02: qty 1

## 2020-10-02 MED ORDER — NITROGLYCERIN 0.4 MG SL SUBL: 0.4000 mg | SUBLINGUAL_TABLET | SUBLINGUAL | Status: DC | PRN

## 2020-10-02 MED ORDER — SODIUM CHLORIDE 0.9% FLUSH
3.0000 mL | Freq: Two times a day (BID) | INTRAVENOUS | Status: DC
  Administered 2020-10-03: 3 mL via INTRAVENOUS

## 2020-10-02 MED ORDER — HEPARIN SODIUM (PORCINE) 5000 UNIT/ML IJ SOLN
5000.0000 [IU] | Freq: Three times a day (TID) | INTRAMUSCULAR | Status: DC
  Administered 2020-10-02 – 2020-10-03 (×2): 5000 [IU] via SUBCUTANEOUS
  Filled 2020-10-02 (×2): qty 1

## 2020-10-02 MED ORDER — ACETAMINOPHEN 325 MG PO TABS: 325.0000 mg | ORAL_TABLET | Freq: Four times a day (QID) | ORAL | Status: DC | PRN

## 2020-10-02 MED ORDER — ASPIRIN EC 81 MG PO TBEC: 81.0000 mg | DELAYED_RELEASE_TABLET | Freq: Every day | ORAL | Status: DC

## 2020-10-02 MED ORDER — SODIUM CHLORIDE 0.9 % WEIGHT BASED INFUSION
1.0000 mL/kg/h | INTRAVENOUS | Status: DC
  Administered 2020-10-03: 1 mL/kg/h via INTRAVENOUS

## 2020-10-02 MED ORDER — SODIUM CHLORIDE 0.9 % WEIGHT BASED INFUSION
3.0000 mL/kg/h | INTRAVENOUS | Status: DC
  Administered 2020-10-03: 3 mL/kg/h via INTRAVENOUS

## 2020-10-02 MED ORDER — SODIUM CHLORIDE 0.9% FLUSH: 3.0000 mL | INTRAVENOUS | Status: DC | PRN

## 2020-10-02 MED ORDER — LOSARTAN POTASSIUM 25 MG PO TABS
25.0000 mg | ORAL_TABLET | Freq: Every day | ORAL | Status: DC
  Administered 2020-10-02: 22:00:00 25 mg via ORAL
  Filled 2020-10-02: qty 1

## 2020-10-02 MED ORDER — ACETAMINOPHEN 325 MG PO TABS: 650.0000 mg | ORAL_TABLET | ORAL | Status: DC | PRN

## 2020-10-02 MED ORDER — ONDANSETRON HCL 4 MG/2ML IJ SOLN
4.0000 mg | Freq: Four times a day (QID) | INTRAMUSCULAR | Status: DC | PRN
  Filled 2020-10-02: qty 2

## 2020-10-02 MED ORDER — ASPIRIN 81 MG PO CHEW
162.0000 mg | CHEWABLE_TABLET | Freq: Once | ORAL | Status: AC
  Administered 2020-10-02: 162 mg via ORAL
  Filled 2020-10-02: qty 2

## 2020-10-02 MED ORDER — METOPROLOL SUCCINATE ER 25 MG PO TB24
12.5000 mg | ORAL_TABLET | Freq: Every day | ORAL | Status: DC
  Administered 2020-10-03 – 2020-10-05 (×2): 12.5 mg via ORAL
  Filled 2020-10-02 (×3): qty 1

## 2020-10-02 MED ORDER — SODIUM CHLORIDE 0.9 % IV SOLN: 250.0000 mL | INTRAVENOUS | Status: DC | PRN

## 2020-10-02 MED ORDER — RANOLAZINE ER 500 MG PO TB12
500.0000 mg | ORAL_TABLET | Freq: Two times a day (BID) | ORAL | Status: DC
  Administered 2020-10-02 – 2020-10-03 (×2): 500 mg via ORAL
  Filled 2020-10-02 (×4): qty 1

## 2020-10-02 MED ORDER — ATORVASTATIN CALCIUM 80 MG PO TABS
80.0000 mg | ORAL_TABLET | Freq: Every day | ORAL | Status: DC
  Administered 2020-10-03: 80 mg via ORAL
  Filled 2020-10-02: qty 1

## 2020-10-02 NOTE — Progress Notes (Signed)
Patient arrived to 5C13 from ED. Orders and safety precautions reviewed. TELE applied and confirmed. Pt aware to be NPO after MN for procedure. Pt voices that she will sign consent in AM.   POC: Pre-op preparation  Continue rounding  Monitor sx

## 2020-10-02 NOTE — H&P (Signed)
Cardiology Admission History and Physical:   Patient ID: Brenda Wilkins MRN: [id number]; DOB: 11/11/1945   Admission date: 10/02/2020  PCP:  Patient, No Pcp Per   [facility]  Cardiologist:  Meriam SpragueHeather E Pemberton, MD  Advanced Practice Provider:  No care team member to display Electrophysiologist:  None        Chief Complaint:  Unstable Angina  Patient Profile:   Brenda KeelDeborah Wilkins is a 75 y.o. female with CAD s/p recent 3V-CABG on 07/18/20 (SVG to LAD, sequential SVG to Diag and OM) and HLD who presented to the ED with acute onset chest pain after recent discharge 08/09/20 (repeat LHC showed TIMI 3 Flow down the LAD from SVG/Diag.  Discussed medical management vs intervention of the native LAD.   History of Present Illness:   Ms. Brenda Wilkins had noted improvement in her anginal chest pressure since 08/30/20 and doing well at cardiac rehab until 09/29/20.  Patient noted that with minimal exertion had 5/6 of 10 CP.  Was started on Imdur 60 mg without significant improvement.   No radiation of chest pain as prior, but this PM called outpatient with concern of CP with any walking.  Given her continued discomfort, advised ED evaluation.  Patient notes that she is feeling CP free presently.  Discomfort occurs with even walking to the restroom. No shortness of breath, DOE .  No PND or orthopnea.  No bendopnea, weight gain, leg swelling , or abdominal swelling.  No syncope or near syncope . Notes  no palpitations or funny heart beats.   Has taken her Imdur today.  Past Medical History:  Diagnosis Date  . Coronary artery disease   . Hyperlipidemia   . Hypertension   . NSTEMI (non-ST elevated myocardial infarction) (HCC) 07/16/2020  . 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 CARDIAC CATHETERIZATION  08/08/2020  . CORONARY ARTERY BYPASS GRAFT N/A 07/18/2020   Procedure: CORONARY ARTERY BYPASS GRAFTING (CABG), ON PUMP, TIMES THREE, USING  ENDOSCOPICALLY HARVESTED RIGHT GREATER SAPHENOUS VEIN;  Surgeon: Alleen BorneBartle, Bryan K, MD;  Location: [facility] OR;  Service: Open Heart Surgery;  Laterality: N/A;  . CORONARY/GRAFT ACUTE MI REVASCULARIZATION N/A 07/18/2020   Procedure: Coronary/Graft Acute MI Revascularization;  Surgeon: SwazilandJordan, Peter M, MD;  [facility];  Service: Cardiovascular;  Laterality: N/A;  . LEFT HEART CATH AND CORONARY ANGIOGRAPHY N/A 07/18/2020   Procedure: LEFT HEART CATH AND CORONARY ANGIOGRAPHY;  Surgeon: Tonny Bollmanooper, Michael, MD;  [facility];  Service: Cardiovascular;  Laterality: N/A;  . LEFT HEART CATH AND CORS/GRAFTS ANGIOGRAPHY N/A 08/08/2020   Procedure: LEFT HEART CATH AND CORS/GRAFTS ANGIOGRAPHY;  Surgeon: Lennette BihariKelly, Thomas A, MD;  Location: [facility] INVASIVE CV LAB;  Service: Cardiovascular;  Laterality: N/A;  . TEE WITHOUT CARDIOVERSION N/A 07/18/2020   Procedure: TRANSESOPHAGEAL ECHOCARDIOGRAM (TEE);  Surgeon: Alleen BorneBartle, Bryan K, MD;  [facility];  Service: Open Heart Surgery;  Laterality: N/A;     Medications Prior to Admission: Prior to Admission medications   Medication Sig Start Date End Date Taking? Authorizing Provider  acetaminophen (TYLENOL) 325 MG tablet Take 325-650 mg by mouth every 6 (six) hours as needed for mild pain (or headaches).   Yes [provider]  aspirin 81 MG EC tablet Take 1 tablet (81 mg total) by mouth daily. Swallow whole. Patient taking differently: Take 81 mg by mouth every morning. Swallow whole. 08/30/20  Yes Meriam SpraguePemberton, Heather E, MD  atorvastatin (LIPITOR)  80 MG tablet Take 1 tablet (80 mg total) by mouth daily. Patient taking differently: Take 80 mg by mouth every morning. 08/23/20  Yes Tonny Bollman, MD  ferrous fumarate-b12-vitamic C-folic acid (FEROCON) capsule Take 1 capsule by mouth 2 (two) times daily.   Yes [provider]  losartan (COZAAR) 25 MG tablet Take 1 tablet (25 mg total) by mouth daily. Patient taking differently: Take 25 mg  by mouth at bedtime. 08/30/20 11/28/20 Yes Meriam Sprague, MD  metoprolol succinate (TOPROL-XL) 25 MG 24 hr tablet Take 0.5 tablets (12.5 mg total) by mouth daily. 08/30/20  Yes Meriam Sprague, MD  nitroGLYCERIN (NITROSTAT) 0.4 MG SL tablet Place 1 tablet (0.4 mg total) under the tongue every 5 (five) minutes x 3 doses as needed for chest pain. 08/09/20  Yes Kroeger, Ovidio Kin., PA-C  ticagrelor (BRILINTA) 90 MG TABS tablet Take 1 tablet (90 mg total) by mouth 2 (two) times daily. 08/30/20  Yes Meriam Sprague, MD  ferrous fumarate-b12-vitamic C-folic acid (TRINSICON / FOLTRIN) capsule Take 1 capsule by mouth 2 (two) times daily after a meal. Patient not taking: Reported on 10/02/2020 09/23/20   Meriam Sprague, MD  isosorbide mononitrate (IMDUR) 60 MG 24 hr tablet Take 1 tablet (60 mg total) by mouth daily. Please do not fill until requested by pt. Patient not taking: No sig reported 09/30/20   Meriam Sprague, MD     Allergies:   No Known Allergies  Social History:   Social History   Socioeconomic History  . Marital status: Unknown    Spouse name: Not on file  . Number of children: Not on file  . Years of education: 50  . Highest education level: Not on file  Occupational History  . Not on file  Tobacco Use  . Smoking status: Never Smoker  . Smokeless tobacco: Never Used  Vaping Use  . Vaping Use: Never used  Substance and Sexual Activity  . Alcohol use: Never  . Drug use: Never  . Sexual activity: Not Currently  Other Topics Concern  . Not on file  Social History Narrative  . Not on file   Social Determinants of Health   Financial Resource Strain: Not on file  Food Insecurity: Not on file  Transportation Needs: Not on file  Physical Activity: Not on file  Stress: Not on file  Social Connections: Not on file  Intimate Partner Violence: Not on file    Family History:   Daughter notes family history of CAD but unclear in which members  presently.  ROS:  Please see the history of present illness.  All other ROS reviewed and negative.     Physical Exam/Data:   Vitals:   10/02/20 1608 10/02/20 1630 10/02/20 1634 10/02/20 1700  BP: 134/73 (!) 143/81  139/66  Pulse: 77 80  74  Resp: 14 19  20   Temp: 97.8 F (36.6 C)     SpO2: 96% 99%  98%  Weight:   64.8 kg   Height:   4' 11.75" (1.518 m)    No intake or output data in the 24 hours ending 10/02/20 1816 Last 3 Weights 10/02/2020 09/27/2020 08/30/2020  Weight (lbs) 142 lb 12.7 oz 142 lb 10.2 oz 143 lb 9.6 oz  Weight (kg) 64.77 kg 64.7 kg 65.137 kg     Body mass index is 28.12 kg/m.  General:  Well nourished, well developed, in no acute distress HEENT: normal Lymph: no adenopathy Neck: no JVD Endocrine:  No thryomegaly Vascular: No carotid bruits; FA pulses 2+ bilaterally without bruits; Radial +2 with no bruit or hematoma Cardiac:  normal S1, S2; RRR; no murmur  Lungs:  clear to auscultation bilaterally, no wheezing, rhonchi or rales  Abd: soft, nontender, no hepatomegaly  Ext: no edema Musculoskeletal:  No deformities, BUE and BLE strength normal and equal Skin: warm and dry  Neuro:  CNs 2-12 intact, no focal abnormalities noted Psych:  Normal affect    EKG:  The ECG that was done  was personally reviewed and demonstrates SR 72 anteroseptal infarct, similar V1 V2 elevation from December.  Reviewed ECG with ED team.  Relevant CV Studies: 08/08/20 LHC  Prox Cx lesion is 80% stenosed.  Prox LAD to Mid LAD lesion is 100% stenosed.  Mid LAD lesion is 80% stenosed.  Origin to Prox Graft lesion is 100% stenosed.  Ost RCA lesion is 40% stenosed.  There is mild to moderate left ventricular systolic dysfunction.  LV end diastolic pressure is mildly elevated.   Mild LV dysfunction with mild anterolateral hypocontractility, EF estimate at 40% with LVEDP at 20 mmHg.  Significant native CAD with total occlusion of the LAD immediately proximal to the takeoff  of the diagonal vessel.  Normal bifurcating ramus intermediate vessel.  Large left circumflex coronary artery with 80% proximal stenosis and competitive filling of a large OM 3 vessel.  Smooth ostial narrowing of the RCA of approximately 30 to 40%.  However with catheter engagement there was significant catheter dampening which improved following IC nitroglycerin administration.  Occluded SVG which had supplied the LAD.  Patent sequential SVG supplying the diagonal vessel which then fills the LAD with previously noted 80% stenosis on an angle in the LAD immediately after the diagonal takeoff, and sequential graft supplying a large OM 3 vessel of the left circumflex coronary artery.  RECOMMENDATION: The patient's non-ST segment elevation myocardial infarction most likely is due to acute occlusion of the vein graft which had supplied the LAD.  This graft is 4 weeks old.  I suspect this occlusion may have contributed by the fact that the LAD is supplied by the diagonal vessel which is grafted and there is TIMI-3 flow down the LAD system.  There continues to be a high-grade 80+ percent stenosis on an angle in the LAD arising from the diagonal vessel which 3 weeks ago was not able to be entered with a wire during attempted acute angioplasty.  Since the patient is pain-free and there is a high likelihood of thrombus in the vein graft which has occluded plan initial aggressive medical management.  Will initiate DAPT with aspirin/Brilinta.  Continue beta-blocker therapy, initiate nitrate therapy, and continue aggressive lipid-lowering therapy.  If patient continues to have symptoms, consider addition of ranolazine and possible future intervention to the native LAD after an aggressive medical therapy trial.    Laboratory Data:  High Sensitivity Troponin:   Recent Labs  Lab 10/02/20 1609  TROPONINIHS 16      Chemistry Recent Labs  Lab 10/02/20 1609  NA 141  K 3.6  CL 106  CO2 24  GLUCOSE  113*  BUN 15  CREATININE 0.87  CALCIUM 9.0  GFRNONAA >60  ANIONGAP 11    No results for input(s): PROT, ALBUMIN, AST, ALT, ALKPHOS, BILITOT in the last 168 hours. Hematology Recent Labs  Lab 10/02/20 1609  WBC 7.7  RBC 4.62  HGB 13.5  HCT 42.3  MCV 91.6  MCH 29.2  MCHC 31.9  RDW 13.7  PLT 304   BNPNo results for input(s): BNP, PROBNP in the last 168 hours.  DDimer No results for input(s): DDIMER in the last 168 hours.   Radiology/Studies:  DG Chest 2 View  Result Date: 10/02/2020 CLINICAL DATA:  Left side chest pain that radiates to the back on exertion x4 days EXAM: CHEST - 2 VIEW COMPARISON:  Chest radiograph August 17, 2020. FINDINGS: The heart size and mediastinal contours are stable. Prior median sternotomy and coronary artery bypass grafting. No focal consolidation. No pleural effusion. No pneumothorax. The visualized skeletal structures are unchanged. IMPRESSION: No acute cardiopulmonary disease. Electronically Signed   By: Maudry Mayhew MD   On: 10/02/2020 16:29     Assessment and Plan:   #NSTEMIsecondary to newly occluded SVG-to-LAD graft #Multivessel CAD s/p CABG on 07/18/20 withSVG to LAD, sequential SVG to Diag and OM # HLD - concern that patient has failed medical management - will trend troponins  -Continue aggressive medical management with ASA, brilinta for at least 12 months -Continue imdur 60mg  daily -Continue atorvastatin 80mg  daily -Continue metop 25mg  XL daily -continue losartan 25mg  daily; repeat BMET in 1 week -started Ranolazine 500 mg PO BID - patient is NPO at midnight for possible LHC - Risks and benefits of cardiac catheterization have been discussed with the patient.  These include bleeding, infection, kidney damage, stroke, heart attack, death.  The patient understands these risks and is willing to proceed. - Discussed with patient and daughter that if the patient has improvement in symptoms that this may still warrant medical therapy,  but given escalation in symptoms; cath is reasonable - Access would be appropriate for R radial vs R femoral given no LIMA; will defer to interventional.   Full Code Diet and NPO @ midnight placed (S&H) Heparin PPX (S&H) Lab orders placed Cath orders placed  Risk Assessment/Risk Scores:     TIMI Risk Score for Unstable Angina or Non-ST Elevation MI:   The patient's TIMI risk score is 5, which indicates a 26% risk of all cause mortality, new or recurrent myocardial infarction or need for urgent revascularization in the next 14 days.    Severity of Illness: The appropriate patient status for this patient is INPATIENT. Inpatient status is judged to be reasonable and necessary in order to provide the required intensity of service to ensure the patient's safety. The patient's presenting symptoms, physical exam findings, and initial radiographic and laboratory data in the context of their chronic comorbidities is felt to place them at high risk for further clinical deterioration. Furthermore, it is not anticipated that the patient will be medically stable for discharge from the hospital within 2 midnights of admission. The following factors support the patient status of inpatient.   " The patient's presenting symptoms include unstable angina. " The initial radiographic and laboratory data are worrisome because of prior left heart catheterization. " The chronic co-morbidities include CAD.   * I certify that at the point of admission it is my clinical judgment that the patient will require inpatient hospital care spanning beyond 2 midnights from the point of admission due to high intensity of service, high risk for further deterioration and high frequency of surveillance required.*    For questions or updates, please contact [facility] Please consult [email or web address] for contact info under     Signed, , MD  10/02/2020 6:16 PM

## 2020-10-02 NOTE — ED Provider Notes (Signed)
[facility] EMERGENCY DEPARTMENT Provider Note   CSN: [id number] Arrival date & time: 10/02/20  1600     History Chief Complaint  Patient presents with  . Chest Pain    Brenda Wilkins is a 75 y.o. female.  75 yo F with a chief complaints of chest pain.  Feels like a pressure worse with exertion and improves with rest.  Worsening over the past 3 or 4 days.  Feels just like when she was just in the hospital and diagnosed with an NSTEMI and also feels similar to when she had triple bypass surgery performed in December.  She had talked with her cardiologist and they had increased her Imdur to 60 mg.  She has taken out the last couple days without improvement.  Every little exertional effort she ends up having symptoms.  Not having any symptoms currently or at rest.  Denies cough congestion or fever.  Denies any other changes to her medications.  The history is provided by the patient.  Chest Pain Pain location:  Substernal area Pain quality: aching and pressure   Pain radiates to:  Does not radiate Pain severity:  Moderate Onset quality:  Gradual Duration:  4 days Progression:  Waxing and waning Chronicity:  New Relieved by:  Rest Worsened by:  Exertion Ineffective treatments:  None tried Associated symptoms: shortness of breath   Associated symptoms: no dizziness, no fever, no headache, no nausea, no palpitations and no vomiting        Past Medical History:  Diagnosis Date  . Coronary artery disease   . Hyperlipidemia   . Hypertension   . NSTEMI (non-ST elevated myocardial infarction) (HCC) 07/16/2020  . 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 Unstable angina pectoris (HCC) 10/02/2020  . Coronary artery disease 08/09/2020  . 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 NSTEMI (non-ST elevated myocardial infarction) (HCC) 07/16/2020  . Hyperlipidemia 07/16/2020    Past Surgical History:  Procedure Laterality Date  . CARDIAC  CATHETERIZATION  08/08/2020  . CORONARY ARTERY BYPASS GRAFT N/A 07/18/2020   Procedure: CORONARY ARTERY BYPASS GRAFTING (CABG), ON PUMP, TIMES THREE, USING ENDOSCOPICALLY HARVESTED RIGHT GREATER SAPHENOUS VEIN;  Surgeon: Alleen Borne, MD;  Location: [facility] OR;  Service: Open Heart Surgery;  Laterality: N/A;  . CORONARY/GRAFT ACUTE MI REVASCULARIZATION N/A 07/18/2020   Procedure: Coronary/Graft Acute MI Revascularization;  Surgeon: [place], Peter M, MD;  Location: [facility] INVASIVE CV LAB;  Service: Cardiovascular;  Laterality: N/A;  . LEFT HEART CATH AND CORONARY ANGIOGRAPHY N/A 07/18/2020   Procedure: LEFT HEART CATH AND CORONARY ANGIOGRAPHY;  Surgeon: Tonny Bollman, MD;  Location: [facility] INVASIVE CV LAB;  Service: Cardiovascular;  Laterality: N/A;  . LEFT HEART CATH AND CORS/GRAFTS ANGIOGRAPHY N/A 08/08/2020   Procedure: LEFT HEART CATH AND CORS/GRAFTS ANGIOGRAPHY;  Surgeon: Lennette Bihari, MD;  Location: [facility] INVASIVE CV LAB;  Service: Cardiovascular;  Laterality: N/A;  . TEE WITHOUT CARDIOVERSION N/A 07/18/2020   Procedure: TRANSESOPHAGEAL ECHOCARDIOGRAM (TEE);  Surgeon: Alleen Borne, MD;  Location: [facility] OR;  Service: Open Heart Surgery;  Laterality: N/A;     OB History   No obstetric history on file.     No family history on file.  Social History   Tobacco Use  . Smoking status: Never Smoker  . Smokeless tobacco: Never Used  Vaping Use  . Vaping Use: Never used  Substance Use Topics  . Alcohol use: Never  . Drug  use: Never    Home Medications Prior to Admission medications   Medication Sig Start Date End Date Taking? Authorizing Provider  acetaminophen (TYLENOL) 325 MG tablet Take 325-650 mg by mouth every 6 (six) hours as needed for mild pain (or headaches).   Yes [provider]  aspirin 81 MG EC tablet Take 1 tablet (81 mg total) by mouth daily. Swallow whole. Patient taking differently: Take 81 mg by mouth every morning. Swallow whole. 08/30/20  Yes Meriam Sprague, MD   atorvastatin (LIPITOR) 80 MG tablet Take 1 tablet (80 mg total) by mouth daily. Patient taking differently: Take 80 mg by mouth every morning. 08/23/20  Yes Tonny Bollman, MD  ferrous fumarate-b12-vitamic C-folic acid (FEROCON) capsule Take 1 capsule by mouth 2 (two) times daily.   Yes [provider]  losartan (COZAAR) 25 MG tablet Take 1 tablet (25 mg total) by mouth daily. Patient taking differently: Take 25 mg by mouth at bedtime. 08/30/20 11/28/20 Yes Meriam Sprague, MD  metoprolol succinate (TOPROL-XL) 25 MG 24 hr tablet Take 0.5 tablets (12.5 mg total) by mouth daily. 08/30/20  Yes Meriam Sprague, MD  nitroGLYCERIN (NITROSTAT) 0.4 MG SL tablet Place 1 tablet (0.4 mg total) under the tongue every 5 (five) minutes x 3 doses as needed for chest pain. 08/09/20  Yes Kroeger, Ovidio Kin., PA-C  ticagrelor (BRILINTA) 90 MG TABS tablet Take 1 tablet (90 mg total) by mouth 2 (two) times daily. 08/30/20  Yes Meriam Sprague, MD  ferrous fumarate-b12-vitamic C-folic acid (TRINSICON / FOLTRIN) capsule Take 1 capsule by mouth 2 (two) times daily after a meal. Patient not taking: Reported on 10/02/2020 09/23/20   Meriam Sprague, MD  isosorbide mononitrate (IMDUR) 60 MG 24 hr tablet Take 1 tablet (60 mg total) by mouth daily. Please do not fill until requested by pt. Patient not taking: No sig reported 09/30/20   Meriam Sprague, MD    Allergies    Patient has no known allergies.  Review of Systems   Review of Systems  Constitutional: Negative for chills and fever.  HENT: Negative for congestion and rhinorrhea.   Eyes: Negative for redness and visual disturbance.  Respiratory: Positive for shortness of breath. Negative for wheezing.   Cardiovascular: Positive for chest pain. Negative for palpitations.  Gastrointestinal: Negative for nausea and vomiting.  Genitourinary: Negative for dysuria and urgency.  Musculoskeletal: Negative for arthralgias and myalgias.  Skin:  Negative for pallor and wound.  Neurological: Negative for dizziness and headaches.    Physical Exam Updated Vital Signs BP 136/77 (BP Location: Right Arm)   Pulse 66   Temp 98.3 F (36.8 C) (Oral)   Resp 16   Ht 4' 11.75" (1.518 m)   Wt 64 kg   SpO2 100%   BMI 27.79 kg/m   Physical Exam Vitals and nursing note reviewed.  Constitutional:      General: She is not in acute distress.    Appearance: She is well-developed and well-nourished. She is not diaphoretic.  HENT:     Head: Normocephalic and atraumatic.  Eyes:     Extraocular Movements: EOM normal.     Pupils: Pupils are equal, round, and reactive to light.  Cardiovascular:     Rate and Rhythm: Normal rate and regular rhythm.     Heart sounds: No murmur heard. No friction rub. No gallop.   Pulmonary:     Effort: Pulmonary effort is normal.     Breath sounds: No wheezing or  rales.  Abdominal:     General: There is no distension.     Palpations: Abdomen is soft.     Tenderness: There is no abdominal tenderness.  Musculoskeletal:        General: No tenderness or edema.     Cervical back: Normal range of motion and neck supple.  Skin:    General: Skin is warm and dry.  Neurological:     Mental Status: She is alert and oriented to person, place, and time.  Psychiatric:        Mood and Affect: Mood and affect normal.        Behavior: Behavior normal.     ED Results / Procedures / Treatments   Labs (all labs ordered are listed, but only abnormal results are displayed) Labs Reviewed  BASIC METABOLIC PANEL - Abnormal; Notable for the following components:      Result Value   Glucose, Bld 113 (*)    All other components within normal limits  RESP PANEL BY RT-PCR (FLU A&B, COVID) ARPGX2  CBC  BASIC METABOLIC PANEL  CBC WITH DIFFERENTIAL/PLATELET  TROPONIN I (HIGH SENSITIVITY)  TROPONIN I (HIGH SENSITIVITY)    EKG EKG Interpretation  Date/Time:  Sunday October 02 2020 16:05:00 EST Ventricular Rate:   78 PR Interval:  138 QRS Duration: 88 QT Interval:  386 QTC Calculation: 440 R Axis:   -4 Text Interpretation: Normal sinus rhythm Anteroseptal infarct , age undetermined Abnormal ECG subtle st elevation in v1,2, seen on prior though slightly more pronounced no reciprical changes Otherwise no significant change Confirmed by Melene Plan [phone]) on 10/02/2020 4:26:01 PM   Radiology DG Chest 2 View  Result Date: 10/02/2020 CLINICAL DATA:  Left side chest pain that radiates to the back on exertion x4 days EXAM: CHEST - 2 VIEW COMPARISON:  Chest radiograph August 17, 2020. FINDINGS: The heart size and mediastinal contours are stable. Prior median sternotomy and coronary artery bypass grafting. No focal consolidation. No pleural effusion. No pneumothorax. The visualized skeletal structures are unchanged. IMPRESSION: No acute cardiopulmonary disease. Electronically Signed   By: Maudry Mayhew MD   On: 10/02/2020 16:29    Procedures Procedures   Medications Ordered in ED Medications  nitroGLYCERIN (NITROSTAT) SL tablet 0.4 mg (has no administration in time range)  acetaminophen (TYLENOL) tablet 650 mg (has no administration in time range)  ondansetron (ZOFRAN) injection 4 mg (has no administration in time range)  heparin injection 5,000 Units (has no administration in time range)  isosorbide mononitrate (IMDUR) 24 hr tablet 60 mg (has no administration in time range)  acetaminophen (TYLENOL) tablet 325-650 mg (has no administration in time range)  aspirin EC tablet 81 mg (has no administration in time range)  atorvastatin (LIPITOR) tablet 80 mg (has no administration in time range)  losartan (COZAAR) tablet 25 mg (has no administration in time range)  metoprolol succinate (TOPROL-XL) 24 hr tablet 12.5 mg (has no administration in time range)  ticagrelor (BRILINTA) tablet 90 mg (has no administration in time range)  sodium chloride flush (NS) 0.9 % injection 3 mL (has no administration in time  range)  sodium chloride flush (NS) 0.9 % injection 3 mL (has no administration in time range)  0.9 %  sodium chloride infusion (has no administration in time range)  0.9% sodium chloride infusion (has no administration in time range)    Followed by  0.9% sodium chloride infusion (has no administration in time range)  ranolazine (RANEXA) 12 hr tablet 500 mg (has  no administration in time range)  aspirin chewable tablet 162 mg (162 mg Oral Given 10/02/20 1643)    ED Course  I have reviewed the triage vital signs and the nursing notes.  Pertinent labs & imaging results that were available during my care of the patient were reviewed by me and considered in my medical decision making (see chart for details).    MDM Rules/Calculators/A&P                          75 yo F with a chief complaints of chest pain.  Going on for about 3 or 4 days.  Recently diagnosed with an NSTEMI and also had triple bypass within the past 3 months.  Pain similar to what she had experienced prior to those admissions to the hospital.  EKG was some subtle changes to V1 and V2.  Will obtain a troponin.  Discussed with cardiology.  Give aspirin.  Cards to admit.   CRITICAL CARE Performed by: Rae Roamaniel Patrick Raynelle Fujikawa   Total critical care time: 35 minutes  Critical care time was exclusive of separately billable procedures and treating other patients.  Critical care was necessary to treat or prevent imminent or life-threatening deterioration.  Critical care was time spent personally by me on the following activities: development of treatment plan with patient and/or surrogate as well as nursing, discussions with consultants, evaluation of patient's response to treatment, examination of patient, obtaining history from patient or surrogate, ordering and performing treatments and interventions, ordering and review of laboratory studies, ordering and review of radiographic studies, pulse oximetry and re-evaluation of patient's  condition.  The patients results and plan were reviewed and discussed.   Any x-rays performed were independently reviewed by myself.   Differential diagnosis were considered with the presenting HPI.  Medications  nitroGLYCERIN (NITROSTAT) SL tablet 0.4 mg (has no administration in time range)  acetaminophen (TYLENOL) tablet 650 mg (has no administration in time range)  ondansetron (ZOFRAN) injection 4 mg (has no administration in time range)  heparin injection 5,000 Units (has no administration in time range)  isosorbide mononitrate (IMDUR) 24 hr tablet 60 mg (has no administration in time range)  acetaminophen (TYLENOL) tablet 325-650 mg (has no administration in time range)  aspirin EC tablet 81 mg (has no administration in time range)  atorvastatin (LIPITOR) tablet 80 mg (has no administration in time range)  losartan (COZAAR) tablet 25 mg (has no administration in time range)  metoprolol succinate (TOPROL-XL) 24 hr tablet 12.5 mg (has no administration in time range)  ticagrelor (BRILINTA) tablet 90 mg (has no administration in time range)  sodium chloride flush (NS) 0.9 % injection 3 mL (has no administration in time range)  sodium chloride flush (NS) 0.9 % injection 3 mL (has no administration in time range)  0.9 %  sodium chloride infusion (has no administration in time range)  0.9% sodium chloride infusion (has no administration in time range)    Followed by  0.9% sodium chloride infusion (has no administration in time range)  ranolazine (RANEXA) 12 hr tablet 500 mg (has no administration in time range)  aspirin chewable tablet 162 mg (162 mg Oral Given 10/02/20 1643)    Vitals:   10/02/20 1700 10/02/20 1800 10/02/20 1900 10/02/20 1945  BP: 139/66 (!) 101/58 128/77 136/77  Pulse: 74 66 65 66  Resp: 20 19 13 16   Temp:    98.3 F (36.8 C)  TempSrc:    Oral  SpO2: 98% 96% 97% 100%  Weight:    64 kg  Height:    4' 11.75" (1.518 m)    Final diagnoses:  Chest pain with  high risk for cardiac etiology  Unstable angina [facility])    Admission/ observation were discussed with the admitting physician, patient and/or family and they are comfortable with the plan.    Final Clinical Impression(s) / ED Diagnoses Final diagnoses:  Chest pain with high risk for cardiac etiology  Unstable angina [facility])    Rx / DC Orders ED Discharge Orders    None       Melene Plan, DO 10/02/20 2041

## 2020-10-02 NOTE — Plan of Care (Signed)
  Problem: Education: Goal: Knowledge of General Education information will improve Description: Including pain rating scale, medication(s)/side effects and non-pharmacologic comfort measures Outcome: Completed/Met

## 2020-10-02 NOTE — ED Notes (Signed)
Cardiology at bedside.

## 2020-10-02 NOTE — ED Triage Notes (Signed)
Pt c/o left sided chest pain that radiates to her back, pain is on exertion x 4 days. Denies shortness of breath or other associated symptoms. States she called her doctor who increased her imdur. CABG in Dec. 2021.

## 2020-10-02 NOTE — Telephone Encounter (Signed)
Paged by answering service.  Patient with recent CABG x 3 and early graft failure x1 .  Imdur increased to 60 mg on Friday.  She did well yesterday morning however before going to bed she had recurrent chest discomfort which resolved spontaneously and able to sleep.  She did well this morning however had  3-4 separate episodes of substernal chest pressure radiating to her back with minimal walking.  One-bedroom to third bedroom or bathroom.  Each episodes resolved with rest in about 5 minutes.  No chest discomfort at rest.  Did not require nitroglycerin.  No associated diaphoresis, shortness of breath, nausea or vomiting.  Symptoms similar to prior angina.  Discussed to increase Imdur further to 90 or 120 mg but patient is hesitant. Patient is scheduled to start cardiac rehab tomorrow>> advised to hold.   After long discussion patient decided to come to ER for further evaluation.  Likely she will need admission and cardiac catheterization tomorrow.

## 2020-10-02 NOTE — ED Notes (Signed)
Call to 5C to give report 

## 2020-10-03 ENCOUNTER — Inpatient Hospital Stay (HOSPITAL_COMMUNITY)
Admission: EM | Disposition: A | Discharge status: Home / Self Care | Payer: Self-pay | Source: Home / Self Care | Attending: Internal Medicine

## 2020-10-03 ENCOUNTER — Encounter (HOSPITAL_COMMUNITY): Payer: Medicare HMO

## 2020-10-03 DIAGNOSIS — I25710 Atherosclerosis of autologous vein coronary artery bypass graft(s) with unstable angina pectoris: Principal | ICD-10-CM

## 2020-10-03 HISTORY — PX: INTRAVASCULAR PRESSURE WIRE/FFR STUDY: CATH118243

## 2020-10-03 HISTORY — PX: LEFT HEART CATH AND CORS/GRAFTS ANGIOGRAPHY: CATH118250

## 2020-10-03 HISTORY — PX: CORONARY STENT INTERVENTION: CATH118234

## 2020-10-03 LAB — BASIC METABOLIC PANEL
Anion gap: 12 (ref 5–15)
BUN: 14 mg/dL (ref 8–23)
CO2: 20 mmol/L — ABNORMAL LOW (ref 22–32)
Calcium: 8.6 mg/dL — ABNORMAL LOW (ref 8.9–10.3)
Chloride: 110 mmol/L (ref 98–111)
Creatinine, Ser: 0.84 mg/dL (ref 0.44–1.00)
GFR, Estimated: >60 mL/min (ref >60)
Glucose, Bld: 96 mg/dL (ref 70–99)
Potassium: 4.1 mmol/L (ref 3.5–5.1)
Sodium: 142 mmol/L (ref 135–145)

## 2020-10-03 LAB — CBC WITH DIFFERENTIAL/PLATELET
Abs Immature Granulocytes: 0.03 10*3/uL (ref 0.00–0.07)
Abs Immature Granulocytes: 0.03 10*3/uL (ref 0.00–0.07)
Basophils Absolute: 0 10*3/uL (ref 0.0–0.1)
Basophils Absolute: 0 10*3/uL (ref 0.0–0.1)
Basophils Relative: 0 %
Basophils Relative: 0 %
Eosinophils Absolute: 0.2 10*3/uL (ref 0.0–0.5)
Eosinophils Absolute: 0.2 10*3/uL (ref 0.0–0.5)
Eosinophils Relative: 4 %
Eosinophils Relative: 2 %
HCT: 39.6 % (ref 36.0–46.0)
HCT: 35.2 % — ABNORMAL LOW (ref 36.0–46.0)
Hemoglobin: 12.4 g/dL (ref 12.0–15.0)
Hemoglobin: 11.2 g/dL — ABNORMAL LOW (ref 12.0–15.0)
Immature Granulocytes: 0 %
Immature Granulocytes: 0 %
Lymphocytes Relative: 30 %
Lymphocytes Relative: 15 %
Lymphs Abs: 2.1 10*3/uL (ref 0.7–4.0)
Lymphs Abs: 1.1 10*3/uL (ref 0.7–4.0)
MCH: 28.7 pg (ref 26.0–34.0)
MCH: 29.4 pg (ref 26.0–34.0)
MCHC: 31.3 g/dL (ref 30.0–36.0)
MCHC: 31.8 g/dL (ref 30.0–36.0)
MCV: 91.7 fL (ref 80.0–100.0)
MCV: 92.4 fL (ref 80.0–100.0)
Monocytes Absolute: 0.6 10*3/uL (ref 0.1–1.0)
Monocytes Absolute: 0.5 10*3/uL (ref 0.1–1.0)
Monocytes Relative: 8 %
Monocytes Relative: 7 %
Neutro Abs: 4 10*3/uL (ref 1.7–7.7)
Neutro Abs: 5.7 10*3/uL (ref 1.7–7.7)
Neutrophils Relative %: 58 %
Neutrophils Relative %: 76 %
Platelets: 253 10*3/uL (ref 150–400)
Platelets: 207 10*3/uL (ref 150–400)
RBC: 4.32 MIL/uL (ref 3.87–5.11)
RBC: 3.81 MIL/uL — ABNORMAL LOW (ref 3.87–5.11)
RDW: 13.4 % (ref 11.5–15.5)
RDW: 13.6 % (ref 11.5–15.5)
WBC: 6.9 10*3/uL (ref 4.0–10.5)
WBC: 7.6 10*3/uL (ref 4.0–10.5)
nRBC: 0 % (ref 0.0–0.2)
nRBC: 0 % (ref 0.0–0.2)

## 2020-10-03 LAB — POCT ACTIVATED CLOTTING TIME
Activated Clotting Time: 279 seconds
Activated Clotting Time: 249 seconds
Activated Clotting Time: 190 seconds
Activated Clotting Time: 172 seconds

## 2020-10-03 SURGERY — LEFT HEART CATH AND CORS/GRAFTS ANGIOGRAPHY
Anesthesia: LOCAL

## 2020-10-03 MED ORDER — MORPHINE SULFATE (PF) 2 MG/ML IV SOLN
2.0000 mg | INTRAVENOUS | Status: DC | PRN
  Administered 2020-10-03 – 2020-10-04 (×5): 2 mg via INTRAVENOUS
  Filled 2020-10-03 (×5): qty 1

## 2020-10-03 MED ORDER — HYDRALAZINE HCL 20 MG/ML IJ SOLN: 10.0000 mg | INTRAMUSCULAR | Status: AC | PRN

## 2020-10-03 MED ORDER — SODIUM CHLORIDE 0.9% FLUSH
3.0000 mL | INTRAVENOUS | Status: DC | PRN
Start: 2020-10-03 — End: 2020-10-05

## 2020-10-03 MED ORDER — IOHEXOL 350 MG/ML SOLN
INTRAVENOUS | Status: DC | PRN
  Administered 2020-10-03: 165 mL

## 2020-10-03 MED ORDER — HEPARIN (PORCINE) IN NACL 1000-0.9 UT/500ML-% IV SOLN
INTRAVENOUS | Status: DC | PRN
  Administered 2020-10-03: 500 mL

## 2020-10-03 MED ORDER — ACETAMINOPHEN 325 MG PO TABS: 650.0000 mg | ORAL_TABLET | ORAL | Status: DC | PRN

## 2020-10-03 MED ORDER — ONDANSETRON HCL 4 MG/2ML IJ SOLN
4.0000 mg | Freq: Four times a day (QID) | INTRAMUSCULAR | Status: DC | PRN
  Administered 2020-10-03: 4 mg via INTRAVENOUS

## 2020-10-03 MED ORDER — SODIUM CHLORIDE 0.9 % IV SOLN: 250.0000 mL | INTRAVENOUS | Status: DC | PRN

## 2020-10-03 MED ORDER — SODIUM CHLORIDE 0.9 % IV SOLN
INTRAVENOUS | Status: AC
  Administered 2020-10-03: 75 mL/h via INTRAVENOUS

## 2020-10-03 MED ORDER — MIDAZOLAM HCL 2 MG/2ML IJ SOLN
INTRAMUSCULAR | Status: DC | PRN
  Administered 2020-10-03 (×2): 1 mg via INTRAVENOUS

## 2020-10-03 MED ORDER — ATORVASTATIN CALCIUM 80 MG PO TABS
80.0000 mg | ORAL_TABLET | Freq: Every day | ORAL | Status: DC
  Administered 2020-10-04 – 2020-10-05 (×2): 80 mg via ORAL
  Filled 2020-10-03 (×2): qty 1

## 2020-10-03 MED ORDER — HEPARIN (PORCINE) IN NACL 1000-0.9 UT/500ML-% IV SOLN
INTRAVENOUS | Status: DC | PRN
Start: 2020-10-03 — End: 2020-10-03
  Administered 2020-10-03: 500 mL

## 2020-10-03 MED ORDER — HEPARIN (PORCINE) IN NACL 1000-0.9 UT/500ML-% IV SOLN
INTRAVENOUS | Status: AC
  Filled 2020-10-03: qty 1000

## 2020-10-03 MED ORDER — SODIUM CHLORIDE 0.9% FLUSH
3.0000 mL | Freq: Two times a day (BID) | INTRAVENOUS | Status: DC
  Administered 2020-10-03 – 2020-10-05 (×4): 3 mL via INTRAVENOUS

## 2020-10-03 MED ORDER — FENTANYL CITRATE (PF) 100 MCG/2ML IJ SOLN
INTRAMUSCULAR | Status: DC | PRN
  Administered 2020-10-03 (×2): 25 ug via INTRAVENOUS

## 2020-10-03 MED ORDER — LABETALOL HCL 5 MG/ML IV SOLN: 10.0000 mg | INTRAVENOUS | Status: AC | PRN

## 2020-10-03 MED ORDER — MIDAZOLAM HCL 2 MG/2ML IJ SOLN
INTRAMUSCULAR | Status: AC
  Filled 2020-10-03: qty 2

## 2020-10-03 MED ORDER — LIDOCAINE HCL (PF) 1 % IJ SOLN
INTRAMUSCULAR | Status: DC | PRN
  Administered 2020-10-03: 20 mL

## 2020-10-03 MED ORDER — NITROGLYCERIN 1 MG/10 ML FOR IR/CATH LAB
INTRA_ARTERIAL | Status: AC
  Filled 2020-10-03: qty 10

## 2020-10-03 MED ORDER — ATROPINE SULFATE 1 MG/10ML IJ SOSY
PREFILLED_SYRINGE | INTRAMUSCULAR | Status: AC
  Filled 2020-10-03: qty 10

## 2020-10-03 MED ORDER — FENTANYL CITRATE (PF) 100 MCG/2ML IJ SOLN
INTRAMUSCULAR | Status: AC
  Filled 2020-10-03: qty 2

## 2020-10-03 MED ORDER — LIDOCAINE HCL (PF) 1 % IJ SOLN
INTRAMUSCULAR | Status: AC
  Filled 2020-10-03: qty 30

## 2020-10-03 MED ORDER — ASPIRIN 81 MG PO CHEW
81.0000 mg | CHEWABLE_TABLET | Freq: Every day | ORAL | Status: DC
  Administered 2020-10-04 – 2020-10-05 (×2): 81 mg via ORAL
  Filled 2020-10-03 (×2): qty 1

## 2020-10-03 MED ORDER — HEPARIN SODIUM (PORCINE) 1000 UNIT/ML IJ SOLN
INTRAMUSCULAR | Status: AC
  Filled 2020-10-03: qty 1

## 2020-10-03 MED ORDER — HEPARIN SODIUM (PORCINE) 1000 UNIT/ML IJ SOLN
INTRAMUSCULAR | Status: DC | PRN
  Administered 2020-10-03: 7000 [IU] via INTRAVENOUS

## 2020-10-03 MED ORDER — TICAGRELOR 90 MG PO TABS
90.0000 mg | ORAL_TABLET | Freq: Two times a day (BID) | ORAL | Status: DC
  Administered 2020-10-04 – 2020-10-05 (×4): 90 mg via ORAL
  Filled 2020-10-03 (×4): qty 1

## 2020-10-03 SURGICAL SUPPLY — 23 items
BAG SNAP BAND KOVER 36X36 (MISCELLANEOUS) ×2 IMPLANT
BALLN SAPPHIRE 2.5X15 (BALLOONS) ×2
BALLN SAPPHIRE ~~LOC~~ 4.0X8 (BALLOONS) ×2 IMPLANT
BALLN ~~LOC~~ SAPPHIRE 4.5X10 (BALLOONS) ×2
BALLOON SAPPHIRE 2.5X15 (BALLOONS) ×1 IMPLANT
BALLOON ~~LOC~~ SAPPHIRE 4.5X10 (BALLOONS) ×1 IMPLANT
CATH INFINITI 5FR MULTPACK ANG (CATHETERS) ×2 IMPLANT
CATH VISTA GUIDE 6FR XB3.5 (CATHETERS) ×2 IMPLANT
CATHETER LAUNCHER 6FR JR4 SH (CATHETERS) ×2 IMPLANT
COVER DOME SNAP 22 D (MISCELLANEOUS) ×2 IMPLANT
GUIDEWIRE PRESSURE X 175 (WIRE) ×2 IMPLANT
KIT ENCORE 26 ADVANTAGE (KITS) ×2 IMPLANT
KIT HEART LEFT (KITS) ×2 IMPLANT
PACK CARDIAC CATHETERIZATION (CUSTOM PROCEDURE TRAY) ×2 IMPLANT
SHEATH PINNACLE 5F 10CM (SHEATH) ×2 IMPLANT
SHEATH PINNACLE 6F 10CM (SHEATH) ×2 IMPLANT
SHEATH PROBE COVER 6X72 (BAG) ×2 IMPLANT
STENT RESOLUTE ONYX 4.5X22 (Permanent Stent) ×2 IMPLANT
SYR MEDRAD MARK 7 150ML (SYRINGE) ×2 IMPLANT
TRANSDUCER W/STOPCOCK (MISCELLANEOUS) ×2 IMPLANT
TUBING CIL FLEX 10 FLL-RA (TUBING) ×2 IMPLANT
WIRE ASAHI PROWATER 180CM (WIRE) ×2 IMPLANT
WIRE EMERALD 3MM-J .035X150CM (WIRE) ×2 IMPLANT

## 2020-10-03 NOTE — Progress Notes (Signed)
Brief Note:  Called to bedside for right groin hematoma after sheath pull. Per the RN the arterial sheath (after a heart cath) was pulled at approximately 7:30 pm. The patient complained of groin pain, nausea and back pain at around 9:30pm. Had a vaso-vagal episode with HR transiently in the 40s. SBP fell to <80 mmHg. Responded to IV fluids. Also received IV Zofran.  Manual compression of hematoma in the right groin for ~10 minutes Repeat bolus of IV fluids (250cc + 250cc) STAT CBC Type and screen Ordered STAT CT of Abdomen and Pelvis with IV contrast IV Morphine PRN for pain control  Miciah Shealy Milinda Pointer, [facility]

## 2020-10-03 NOTE — H&P (View-Only) (Signed)
 Progress Note  Patient Name: Brenda Wilkins Date of Encounter: 10/03/2020  [facility] Cardiologist: Heather E Pemberton, MD   Subjective   No CP or dyspnea  Inpatient Medications    Scheduled Meds: . aspirin EC  81 mg Oral Daily  . atorvastatin  80 mg Oral Daily  . heparin  5,000 Units Subcutaneous Q8H  . isosorbide mononitrate  60 mg Oral Daily  . losartan  25 mg Oral Daily  . metoprolol succinate  12.5 mg Oral Daily  . ranolazine  500 mg Oral BID  . sodium chloride flush  3 mL Intravenous Q12H  . ticagrelor  90 mg Oral BID   Continuous Infusions: . sodium chloride    . sodium chloride 1 mL/kg/hr (10/03/20 0521)   PRN Meds: sodium chloride, acetaminophen, acetaminophen, nitroGLYCERIN, ondansetron (ZOFRAN) IV, sodium chloride flush   Vital Signs    Vitals:   10/02/20 1945 10/03/20 0015 10/03/20 0428 10/03/20 0812  BP: 136/77 104/78 (!) 102/59 117/66  Pulse: 66 68 64 63  Resp: 16 14 16 16  Temp: 98.3 F (36.8 C) 98 F (36.7 C) 98.3 F (36.8 C) 97.9 F (36.6 C)  TempSrc: Oral Oral Oral Oral  SpO2: 100% 95% 97% 97%  Weight: 64 kg     Height: 4' 11.75" (1.518 m)       Intake/Output Summary (Last 24 hours) at 10/03/2020 0916 Last data filed at 10/02/2020 2158 Gross per 24 hour  Intake 360 ml  Output -  Net 360 ml   Last 3 Weights 10/02/2020 10/02/2020 09/27/2020  Weight (lbs) 141 lb 1.6 oz 142 lb 12.7 oz 142 lb 10.2 oz  Weight (kg) 64.003 kg 64.77 kg 64.7 kg      Telemetry    Sinus - Personally Reviewed   Physical Exam   GEN: No acute distress.   Neck: No JVD Cardiac: RRR, no murmurs, rubs, or gallops.  Respiratory: Clear to auscultation bilaterally. GI: Soft, nontender, non-distended  MS: No edema Neuro:  Nonfocal  Psych: Normal affect   Labs    High Sensitivity Troponin:   Recent Labs  Lab 10/02/20 1609 10/02/20 1841  TROPONINIHS 16 14      Chemistry Recent Labs  Lab 10/02/20 1609 10/03/20 0212  NA 141 142  K 3.6 4.1  CL 106  110  CO2 24 20*  GLUCOSE 113* 96  BUN 15 14  CREATININE 0.87 0.84  CALCIUM 9.0 8.6*  GFRNONAA >60 >60  ANIONGAP 11 12     Hematology Recent Labs  Lab 10/02/20 1609 10/03/20 0212  WBC 7.7 6.9  RBC 4.62 4.32  HGB 13.5 12.4  HCT 42.3 39.6  MCV 91.6 91.7  MCH 29.2 28.7  MCHC 31.9 31.3  RDW 13.7 13.4  PLT 304 253    Radiology    DG Chest 2 View  Result Date: 10/02/2020 CLINICAL DATA:  Left side chest pain that radiates to the back on exertion x4 days EXAM: CHEST - 2 VIEW COMPARISON:  Chest radiograph August 17, 2020. FINDINGS: The heart size and mediastinal contours are stable. Prior median sternotomy and coronary artery bypass grafting. No focal consolidation. No pleural effusion. No pneumothorax. The visualized skeletal structures are unchanged. IMPRESSION: No acute cardiopulmonary disease. Electronically Signed   By: Jeffrey  Waltz MD   On: 10/02/2020 16:29    Patient Profile     75 y.o. female with past medical history of coronary artery disease status post coronary artery bypass and graft December 2021, hyperlipidemia admitted with   chest pain.  Patient had emergent coronary artery bypass and graft December 2021 with saphenous vein graft to LAD, sequential saphenous vein graft to diagonal and obtuse marginal.  Had chest pain following procedure and catheterization January 3 showed 80% circumflex, occluded LAD followed by 80% stenosis, saphenous vein graft to LAD occluded and 40% right coronary artery.  Ejection fraction 40%.  Patient was treated medically and if persistent symptoms noted PCI of the LAD could be attempted.  Echocardiogram January 2022 showed normal LV function, grade 1 diastolic dysfunction.  Assessment & Plan    1 unstable angina-has again developed exertional chest pain (minimal exertion).  Her isosorbide was increased but she continues to have symptoms.  Continue aspirin, brilinta, beta-blocker, statin and ranolazine.  Plan is for cardiac catheterization  today.  Hopefully PCI of LAD will be possible to improve symptoms.  The risk and benefits of cardiac catheterization including myocardial infarction, CVA and death discussed and she agrees to proceed.  2 hyperlipidemia-continue statin.  For questions or updates, please contact [facility] Please consult [email or web address] for contact info under        Signed, Dymin Dingledine, MD  10/03/2020, 9:16 AM    

## 2020-10-03 NOTE — Interval H&P Note (Signed)
Cath Lab Visit (complete for each Cath Lab visit)  Clinical Evaluation Leading to the Procedure:   ACS: Yes.    Non-ACS:    Anginal Classification: CCS III  Anti-ischemic medical therapy: Maximal Therapy (2 or more classes of medications)  Non-Invasive Test Results: No non-invasive testing performed  Prior CABG: Previous CABG      History and Physical Interval Note:  10/03/2020 2:23 PM  Brenda Wilkins  has presented today for surgery, with the diagnosis of unstable angina.  The various methods of treatment have been discussed with the patient and family. After consideration of risks, benefits and other options for treatment, the patient has consented to  Procedure(s): LEFT HEART CATH AND CORS/GRAFTS ANGIOGRAPHY (N/A) as a surgical intervention.  The patient's history has been reviewed, patient examined, no change in status, stable for surgery.  I have reviewed the patient's chart and labs.  Questions were answered to the patient's satisfaction.     Nanetta Batty

## 2020-10-03 NOTE — Progress Notes (Signed)
Cardiac Individual Treatment Plan  Patient Details  Name: Brenda Wilkins MRN: [id number] Date of Birth: May 17, 1946 Referring Provider:   Flowsheet Row CARDIAC REHAB PHASE II ORIENTATION from 09/27/2020 in [facility] CARDIAC REHAB  Referring Provider Meriam Sprague, MD      Initial Encounter Date:  Flowsheet Row CARDIAC REHAB PHASE II ORIENTATION from 09/27/2020 in [facility] CARDIAC REHAB  Date 09/27/20      Visit Diagnosis: 08/07/2020 NSTEMI  Patient's Home Medications on Admission: No current facility-administered medications for this encounter. No current outpatient medications on file.  Facility-Administered Medications Ordered in Other Encounters:    0.9 %  sodium chloride infusion, 250 mL, Intravenous, PRN, Runell Gess, MD   acetaminophen (TYLENOL) tablet 650 mg, 650 mg, Oral, Q4H PRN, Runell Gess, MD   aspirin chewable tablet 81 mg, 81 mg, Oral, Daily, Runell Gess, MD, 81 mg at 10/04/20 1000   atorvastatin (LIPITOR) tablet 80 mg, 80 mg, Oral, Daily, Runell Gess, MD, 80 mg at 10/04/20 1000   HYDROcodone-acetaminophen (NORCO/VICODIN) 5-325 MG per tablet 1-2 tablet, 1-2 tablet, Oral, Q4H PRN, Barrett, Rhonda G, PA-C   metoprolol succinate (TOPROL-XL) 24 hr tablet 12.5 mg, 12.5 mg, Oral, Daily, Runell Gess, MD, 12.5 mg at 10/03/20 0815   morphine 2 MG/ML injection 2 mg, 2 mg, Intravenous, Q1H PRN, Runell Gess, MD, 2 mg at 10/04/20 0759   nitroGLYCERIN (NITROSTAT) SL tablet 0.4 mg, 0.4 mg, Sublingual, Q5 Min x 3 PRN, Runell Gess, MD   ondansetron [facility]) injection 4 mg, 4 mg, Intravenous, Q6H PRN, Runell Gess, MD   sodium chloride flush (NS) 0.9 % injection 3 mL, 3 mL, Intravenous, Q12H, Runell Gess, MD, 3 mL at 10/04/20 1018   sodium chloride flush (NS) 0.9 % injection 3 mL, 3 mL, Intravenous, PRN, Runell Gess, MD   ticagrelor [facility]) tablet 90 mg, 90 mg, Oral, BID,  Runell Gess, MD, 90 mg at 10/04/20 1012  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    Tobacco Use: Social History   Tobacco Use  Smoking Status Never Smoker  Smokeless Tobacco Never Used    Labs: Recent Review Flowsheet Data    Labs for ITP Cardiac and Pulmonary Rehab Latest Ref Rng & Units 07/18/2020 07/19/2020 07/19/2020 07/19/2020 07/19/2020   Cholestrol 0 - 200 mg/dL - - - - -   LDLCALC 0 - 99 mg/dL - - - - -   HDL >09 mg/dL - - - - -   Trlycerides <150 mg/dL - - - - -   Hemoglobin A1c 4.8 - 5.6 % - - - - -   PHART 7.350 - 7.450 7.350 7.372 7.376 7.387 7.353   PCO2ART 32.0 - 48.0 mmHg 50.4(H) 48.5(H) 45.1 42.2 44.8   HCO3 20.0 - 28.0 mmol/L 27.5 28.0 26.2 25.2 24.7   TCO2 22 - 32 mmol/L 29 29 27 26 26    ACIDBASEDEF 0.0 - 2.0 mmol/L - - - - 1.0   O2SAT % 100.0 98.0 98.0 97.0 96.0      Capillary Blood Glucose: Lab Results  Component Value Date   GLUCAP 89 07/21/2020   GLUCAP 95 07/21/2020   GLUCAP 100 (H) 07/21/2020   GLUCAP 93 07/21/2020   GLUCAP 104 (H) 07/20/2020  Exercise Target Goals: Exercise Program Goal: Individual exercise prescription set using results from initial 6 min walk test and THRR while considering  patients activity barriers and safety.   Exercise Prescription Goal: Initial exercise prescription builds to 30-45 minutes a day of aerobic activity, 2-3 days per week.  Home exercise guidelines will be given to patient during program as part of exercise prescription that the participant will acknowledge.  Activity Barriers & Risk Stratification:  Activity Barriers & Cardiac Risk Stratification - 09/27/20 1022      Activity Barriers & Cardiac Risk Stratification   Activity Barriers History of Falls    Cardiac Risk Stratification High           6 Minute  Walk:  6 Minute Walk    Row Name 09/27/20 1051         6 Minute Walk   Phase Initial     Distance 1107 feet     Walk Time 6 minutes     # of Rest Breaks 0     MPH 2.1     METS 2.36     RPE 9     Perceived Dyspnea  0     VO2 Peak 8.27     Symptoms No     Resting HR 80 bpm     Resting BP 102/68     Resting Oxygen Saturation  98 %     Exercise Oxygen Saturation  during 6 min walk 99 %     Max Ex. HR 111 bpm     Max Ex. BP 128/74     2 Minute Post BP 110/69            Oxygen Initial Assessment:   Oxygen Re-Evaluation:   Oxygen Discharge (Final Oxygen Re-Evaluation):   Initial Exercise Prescription:  Initial Exercise Prescription - 09/27/20 1100      Date of Initial Exercise RX and Referring Provider   Date 09/27/20    Referring Provider Meriam SpraguePemberton, Heather E, MD    Expected Discharge Date 11/25/20      NuStep   Level 1    SPM 85    Minutes 15    METs 2.3      Track   Laps 12    Minutes 15    METs 2.39      Prescription Details   Frequency (times per week) 3    Duration Progress to 30 minutes of continuous aerobic without signs/symptoms of physical distress      Intensity   THRR 40-80% of Max Heartrate 59-118    Ratings of Perceived Exertion 11-13    Perceived Dyspnea 0-4      Progression   Progression Continue to progress workloads to maintain intensity without signs/symptoms of physical distress.      Resistance Training   Training Prescription Yes    Weight 2 lbs    Reps 10-15           Perform Capillary Blood Glucose checks as needed.  Exercise Prescription Changes:   Exercise Comments:   Exercise Goals and Review:   Exercise Goals    Row Name 09/27/20 1033             Exercise Goals   Increase Physical Activity Yes       Intervention Provide advice, education, support and counseling about physical activity/exercise needs.;Develop an individualized exercise prescription for aerobic and resistive training based on initial  evaluation findings, risk stratification, comorbidities and participant's personal goals.  Expected Outcomes Short Term: Attend rehab on a regular basis to increase amount of physical activity.;Long Term: Add in home exercise to make exercise part of routine and to increase amount of physical activity.;Long Term: Exercising regularly at least 3-5 days a week.       Increase Strength and Stamina Yes       Intervention Provide advice, education, support and counseling about physical activity/exercise needs.;Develop an individualized exercise prescription for aerobic and resistive training based on initial evaluation findings, risk stratification, comorbidities and participant's personal goals.       Expected Outcomes Short Term: Increase workloads from initial exercise prescription for resistance, speed, and METs.;Long Term: Improve cardiorespiratory fitness, muscular endurance and strength as measured by increased METs and functional capacity ( );Short Term: Perform resistance training exercises routinely during rehab and add in resistance training at home       Able to understand and use rate of perceived exertion (RPE) scale Yes       Intervention Provide education and explanation on how to use RPE scale       Expected Outcomes Short Term: Able to use RPE daily in rehab to express subjective intensity level;Long Term:  Able to use RPE to guide intensity level when exercising independently       Knowledge and understanding of Target Heart Rate Range (THRR) Yes       Intervention Provide education and explanation of THRR including how the numbers were predicted and where they are located for reference       Expected Outcomes Short Term: Able to state/look up THRR;Long Term: Able to use THRR to govern intensity when exercising independently;Short Term: Able to use daily as guideline for intensity in rehab       Able to check pulse independently Yes       Intervention Provide education and  demonstration on how to check pulse in carotid and radial arteries.;Review the importance of being able to check your own pulse for safety during independent exercise       Expected Outcomes Short Term: Able to explain why pulse checking is important during independent exercise;Long Term: Able to check pulse independently and accurately       Understanding of Exercise Prescription Yes       Intervention Provide education, explanation, and written materials on patient's individual exercise prescription       Expected Outcomes Short Term: Able to explain program exercise prescription;Long Term: Able to explain home exercise prescription to exercise independently              Exercise Goals Re-Evaluation :   Discharge Exercise Prescription (Final Exercise Prescription Changes):   Nutrition:  Target Goals: Understanding of nutrition guidelines, daily intake of sodium 1500mg , cholesterol 200mg , calories 30% from fat and 7% or less from saturated fats, daily to have 5 or more servings of fruits and vegetables.  Biometrics:  Pre Biometrics - 09/27/20 1023      Pre Biometrics   Waist Circumference 36.75 inches    Hip Circumference 39.75 inches    Waist to Hip Ratio 0.92 %    Triceps Skinfold 26 mm    % Body Fat 40.5 %    Grip Strength 25.5 kg    Flexibility 14.63 in    Single Leg Stand 25.93 seconds            Nutrition Therapy Plan and Nutrition Goals:   Nutrition Assessments:  MEDIFICTS Score Key:  ?70 Need to make dietary changes   40-70  Heart Healthy Diet  ? 40 Therapeutic Level Cholesterol Diet    Picture Your Plate Scores:  <60 Unhealthy dietary pattern with much room for improvement.  41-50 Dietary pattern unlikely to meet recommendations for good health and room for improvement.  51-60 More healthful dietary pattern, with some room for improvement.   >60 Healthy dietary pattern, although there may be some specific behaviors that could be improved.     Nutrition Goals Re-Evaluation:   Nutrition Goals Re-Evaluation:   Nutrition Goals Discharge (Final Nutrition Goals Re-Evaluation):   Psychosocial: Target Goals: Acknowledge presence or absence of significant depression and/or stress, maximize coping skills, provide positive support system. Participant is able to verbalize types and ability to use techniques and skills needed for reducing stress and depression.  Initial Review & Psychosocial Screening:  Initial Psych Review & Screening - 09/27/20 1129      Initial Review   Current issues with Current Stress Concerns    Source of Stress Concerns Chronic Illness    Comments Debby says she had some stress when she initially had her open heart surgery and MI. Debby says that she is better now      [facility]   Good Support System? Yes   Debby lives alone but has her son and daughter for support.     Barriers   Psychosocial barriers to participate in program The patient should benefit from training in stress management and relaxation.      Screening Interventions   Interventions Encouraged to exercise    Expected Outcomes Long Term Goal: Stressors or current issues are controlled or eliminated.           Quality of Life Scores:  Quality of Life - 09/27/20 1151      Quality of Life   Select Quality of Life      Quality of Life Scores   Health/Function Pre 26.65 %    Socioeconomic Pre 27.36 %    Psych/Spiritual Pre 28.79 %    Family Pre 30 %    GLOBAL Pre 27.73 %          Scores of 19 and below usually indicate a poorer quality of life in these areas.  A difference of  2-3 points is a clinically meaningful difference.  A difference of 2-3 points in the total score of the Quality of Life Index has been associated with significant improvement in overall quality of life, self-image, physical symptoms, and general health in studies assessing change in quality of life.  PHQ-9: Recent Review Flowsheet Data     Depression screen [facility] 2/9 09/27/2020   Decreased Interest 0   Down, Depressed, Hopeless 0   PHQ - 2 Score 0     Interpretation of Total Score  Total Score Depression Severity:  1-4 = Minimal depression, 5-9 = Mild depression, 10-14 = Moderate depression, 15-19 = Moderately severe depression, 20-27 = Severe depression   Psychosocial Evaluation and Intervention:   Psychosocial Re-Evaluation:   Psychosocial Discharge (Final Psychosocial Re-Evaluation):   Vocational Rehabilitation: Provide vocational rehab assistance to qualifying candidates.   Vocational Rehab Evaluation & Intervention:  Vocational Rehab - 09/27/20 1135      Initial Vocational Rehab Evaluation & Intervention   Assessment shows need for Vocational Rehabilitation No   Debby works full time and does not need vocational rehab at this time.          Education: Education Goals: Education classes will be provided on a weekly basis, covering required topics. Participant  will state understanding/return demonstration of topics presented.  Learning Barriers/Preferences:  Learning Barriers/Preferences - 09/27/20 1133      Learning Barriers/Preferences   Learning Barriers Sight   wears contacts and glasses   Learning Preferences Audio;Computer/Internet;Group Instruction;Individual Instruction;Pictoral;Skilled Demonstration;Verbal Instruction;Video;Written Material           Education Topics: Count Your Pulse:  -Group instruction provided by verbal instruction, demonstration, patient participation and written materials to support subject.  Instructors address importance of being able to find your pulse and how to count your pulse when at home without a heart monitor.  Patients get hands on experience counting their pulse with staff help and individually.   Heart Attack, Angina, and Risk Factor Modification:  -Group instruction provided by verbal instruction, video, and written materials to support subject.   Instructors address signs and symptoms of angina and heart attacks.    Also discuss risk factors for heart disease and how to make changes to improve heart health risk factors.   Functional Fitness:  -Group instruction provided by verbal instruction, demonstration, patient participation, and written materials to support subject.  Instructors address safety measures for doing things around the house.  Discuss how to get up and down off the floor, how to pick things up properly, how to safely get out of a chair without assistance, and balance training.   Meditation and Mindfulness:  -Group instruction provided by verbal instruction, patient participation, and written materials to support subject.  Instructor addresses importance of mindfulness and meditation practice to help reduce stress and improve awareness.  Instructor also leads participants through a meditation exercise.    Stretching for Flexibility and Mobility:  -Group instruction provided by verbal instruction, patient participation, and written materials to support subject.  Instructors lead participants through series of stretches that are designed to increase flexibility thus improving mobility.  These stretches are additional exercise for major muscle groups that are typically performed during regular warm up and cool down.   Hands Only CPR:  -Group verbal, video, and participation provides a basic overview of AHA guidelines for community CPR. Role-play of emergencies allow participants the opportunity to practice calling for help and chest compression technique with discussion of AED use.   Hypertension: -Group verbal and written instruction that provides a basic overview of hypertension including the most recent diagnostic guidelines, risk factor reduction with self-care instructions and medication management.    Nutrition I class: Heart Healthy Eating:  -Group instruction provided by PowerPoint slides, verbal discussion, and  written materials to support subject matter. The instructor gives an explanation and review of the Therapeutic Lifestyle Changes diet recommendations, which includes a discussion on lipid goals, dietary fat, sodium, fiber, plant stanol/sterol esters, sugar, and the components of a well-balanced, healthy diet.   Nutrition II class: Lifestyle Skills:  -Group instruction provided by PowerPoint slides, verbal discussion, and written materials to support subject matter. The instructor gives an explanation and review of label reading, grocery shopping for heart health, heart healthy recipe modifications, and ways to make healthier choices when eating out.   Diabetes Question & Answer:  -Group instruction provided by PowerPoint slides, verbal discussion, and written materials to support subject matter. The instructor gives an explanation and review of diabetes co-morbidities, pre- and post-prandial blood glucose goals, pre-exercise blood glucose goals, signs, symptoms, and treatment of hypoglycemia and hyperglycemia, and foot care basics.   Diabetes Blitz:  -Group instruction provided by PowerPoint slides, verbal discussion, and written materials to support subject matter. The instructor gives an  explanation and review of the physiology behind type 1 and type 2 diabetes, diabetes medications and rational behind using different medications, pre- and post-prandial blood glucose recommendations and Hemoglobin A1c goals, diabetes diet, and exercise including blood glucose guidelines for exercising safely.    Portion Distortion:  -Group instruction provided by PowerPoint slides, verbal discussion, written materials, and food models to support subject matter. The instructor gives an explanation of serving size versus portion size, changes in portions sizes over the last 20 years, and what consists of a serving from each food group.   Stress Management:  -Group instruction provided by verbal instruction,  video, and written materials to support subject matter.  Instructors review role of stress in heart disease and how to cope with stress positively.     Exercising on Your Own:  -Group instruction provided by verbal instruction, power point, and written materials to support subject.  Instructors discuss benefits of exercise, components of exercise, frequency and intensity of exercise, and end points for exercise.  Also discuss use of nitroglycerin and activating EMS.  Review options of places to exercise outside of rehab.  Review guidelines for sex with heart disease.   Cardiac Drugs I:  -Group instruction provided by verbal instruction and written materials to support subject.  Instructor reviews cardiac drug classes: antiplatelets, anticoagulants, beta blockers, and statins.  Instructor discusses reasons, side effects, and lifestyle considerations for each drug class.   Cardiac Drugs II:  -Group instruction provided by verbal instruction and written materials to support subject.  Instructor reviews cardiac drug classes: angiotensin converting enzyme inhibitors (ACE-I), angiotensin II receptor blockers (ARBs), nitrates, and calcium channel blockers.  Instructor discusses reasons, side effects, and lifestyle considerations for each drug class.   Anatomy and Physiology of the Circulatory System:  Group verbal and written instruction and models provide basic cardiac anatomy and physiology, with the coronary electrical and arterial systems. Review of: AMI, Angina, Valve disease, Heart Failure, Peripheral Artery Disease, Cardiac Arrhythmia, Pacemakers, and the ICD.   Other Education:  -Group or individual verbal, written, or video instructions that support the educational goals of the cardiac rehab program.   Holiday Eating Survival Tips:  -Group instruction provided by PowerPoint slides, verbal discussion, and written materials to support subject matter. The instructor gives patients tips,  tricks, and techniques to help them not only survive but enjoy the holidays despite the onslaught of food that accompanies the holidays.   Knowledge Questionnaire Score:  Knowledge Questionnaire Score - 09/27/20 1151      Knowledge Questionnaire Score   Pre Score 22/24           Core Components/Risk Factors/Patient Goals at Admission:  Personal Goals and Risk Factors at Admission - 09/27/20 1147      Core Components/Risk Factors/Patient Goals on Admission    Weight Management Weight Maintenance;Yes    Intervention Weight Management: Develop a combined nutrition and exercise program designed to reach desired caloric intake, while maintaining appropriate intake of nutrient and fiber, sodium and fats, and appropriate energy expenditure required for the weight goal.;Weight Management: Provide education and appropriate resources to help participant work on and attain dietary goals.    Expected Outcomes Long Term: Adherence to nutrition and physical activity/exercise program aimed toward attainment of established weight goal;Weight Maintenance: Understanding of the daily nutrition guidelines, which includes 25-35% calories from fat, 7% or less cal from saturated fats, less than  cholesterol, less than 1.5gm of sodium, & 5 or more servings of fruits and vegetables daily;Understanding of  distribution of calorie intake throughout the day with the consumption of 4-5 meals/snacks    Hypertension Yes    Intervention Provide education on lifestyle modifcations including regular physical activity/exercise, weight management, moderate sodium restriction and increased consumption of fresh fruit, vegetables, and low fat dairy, alcohol moderation, and smoking cessation.;Monitor prescription use compliance.    Expected Outcomes Short Term: Continued assessment and intervention until BP is < 140/78mm HG in hypertensive participants. < 130/12mm HG in hypertensive participants with diabetes, heart failure or  chronic kidney disease.;Long Term: Maintenance of blood pressure at goal levels.    Lipids Yes    Intervention Provide education and support for participant on nutrition & aerobic/resistive exercise along with prescribed medications to achieve LDL 70mg , HDL >40mg .    Expected Outcomes Short Term: Participant states understanding of desired cholesterol values and is compliant with medications prescribed. Participant is following exercise prescription and nutrition guidelines.;Long Term: Cholesterol controlled with medications as prescribed, with individualized exercise RX and with personalized nutrition plan. Value goals: LDL < , HDL > 40 mg.    Stress Yes    Intervention Offer individual and/or small group education and counseling on adjustment to heart disease, stress management and health-related lifestyle change. Teach and support self-help strategies.;Refer participants experiencing significant psychosocial distress to appropriate mental health specialists for further evaluation and treatment. When possible, include family members and significant others in education/counseling sessions.    Expected Outcomes Short Term: Participant demonstrates changes in health-related behavior, relaxation and other stress management skills, ability to obtain effective social support, and compliance with psychotropic medications if prescribed.;Long Term: Emotional wellbeing is indicated by absence of clinically significant psychosocial distress or social isolation.           Core Components/Risk Factors/Patient Goals Review:    Core Components/Risk Factors/Patient Goals at Discharge (Final Review):    ITP Comments:  ITP Comments    Row Name 09/27/20 1032 10/04/20 1338         ITP Comments Medical Director- Dr. Armanda Magic, MD 30 Day ITP Review. Patient is currently in the hospital. Exercise is currently on hold             Comments: See ITP comments

## 2020-10-03 NOTE — Plan of Care (Signed)
  Problem: Health Behavior/Discharge Planning: Goal: Ability to manage health-related needs will improve Outcome: Progressing   Problem: Clinical Measurements: Goal: Ability to maintain clinical measurements within normal limits will improve Outcome: Progressing Goal: Will remain free from infection Outcome: Progressing Goal: Diagnostic test results will improve Outcome: Progressing Goal: Respiratory complications will improve Outcome: Progressing Goal: Cardiovascular complication will be avoided Outcome: Progressing   Problem: Activity: Goal: Risk for activity intolerance will decrease Outcome: Progressing   Problem: Nutrition: Goal: Adequate nutrition will be maintained Outcome: Progressing   Problem: Coping: Goal: Level of anxiety will decrease Outcome: Progressing   Problem: Elimination: Goal: Will not experience complications related to bowel motility Outcome: Progressing Goal: Will not experience complications related to urinary retention Outcome: Progressing   Problem: Pain Managment: Goal: General experience of comfort will improve Outcome: Progressing   Problem: Safety: Goal: Ability to remain free from injury will improve Outcome: Progressing   Problem: Skin Integrity: Goal: Risk for impaired skin integrity will decrease Outcome: Progressing   Problem: Education: Goal: Understanding of CV disease, CV risk reduction, and recovery process will improve Outcome: Progressing Goal: Individualized Educational Video(s) Outcome: Progressing   Problem: Activity: Goal: Ability to return to baseline activity level will improve Outcome: Progressing   Problem: Cardiovascular: Goal: Ability to achieve and maintain adequate cardiovascular perfusion will improve Outcome: Progressing Goal: Vascular access site(s) Level 0-1 will be maintained Outcome: Progressing   Problem: Health Behavior/Discharge Planning: Goal: Ability to safely manage health-related needs  after discharge will improve Outcome: Progressing   

## 2020-10-03 NOTE — Progress Notes (Signed)
Progress Note  Patient Name: Brenda Wilkins Date of Encounter: 10/03/2020  [facility] Cardiologist: Meriam Sprague, MD   Subjective   No CP or dyspnea  Inpatient Medications    Scheduled Meds: . aspirin EC  81 mg Oral Daily  . atorvastatin  80 mg Oral Daily  . heparin  5,000 Units Subcutaneous Q8H  . isosorbide mononitrate  60 mg Oral Daily  . losartan  25 mg Oral Daily  . metoprolol succinate  12.5 mg Oral Daily  . ranolazine  500 mg Oral BID  . sodium chloride flush  3 mL Intravenous Q12H  . ticagrelor  90 mg Oral BID   Continuous Infusions: . sodium chloride    . sodium chloride 1 mL/kg/hr (10/03/20 0521)   PRN Meds: sodium chloride, acetaminophen, acetaminophen, nitroGLYCERIN, ondansetron (ZOFRAN) IV, sodium chloride flush   Vital Signs    Vitals:   10/02/20 1945 10/03/20 0015 10/03/20 0428 10/03/20 0812  BP: 136/77 104/78 (!) 102/59 117/66  Pulse: 66 68 64 63  Resp: 16 14 16 16   Temp: 98.3 F (36.8 C) 98 F (36.7 C) 98.3 F (36.8 C) 97.9 F (36.6 C)  TempSrc: Oral Oral Oral Oral  SpO2: 100% 95% 97% 97%  Weight: 64 kg     Height: 4' 11.75" (1.518 m)       Intake/Output Summary (Last 24 hours) at 10/03/2020 0916 Last data filed at 10/02/2020 2158 Gross per 24 hour  Intake 360 ml  Output -  Net 360 ml   Last 3 Weights 10/02/2020 10/02/2020 09/27/2020  Weight (lbs) 141 lb 1.6 oz 142 lb 12.7 oz 142 lb 10.2 oz  Weight (kg) 64.003 kg 64.77 kg 64.7 kg      Telemetry    Sinus - Personally Reviewed   Physical Exam   GEN: No acute distress.   Neck: No JVD Cardiac: RRR, no murmurs, rubs, or gallops.  Respiratory: Clear to auscultation bilaterally. GI: Soft, nontender, non-distended  MS: No edema Neuro:  Nonfocal  Psych: Normal affect   Labs    High Sensitivity Troponin:   Recent Labs  Lab 10/02/20 1609 10/02/20 1841  TROPONINIHS 16 14      Chemistry Recent Labs  Lab 10/02/20 1609 10/03/20 0212  NA 141 142  K 3.6 4.1  CL 106  110  CO2 24 20*  GLUCOSE 113* 96  BUN 15 14  CREATININE 0.87 0.84  CALCIUM 9.0 8.6*  GFRNONAA >60 >60  ANIONGAP 11 12     Hematology Recent Labs  Lab 10/02/20 1609 10/03/20 0212  WBC 7.7 6.9  RBC 4.62 4.32  HGB 13.5 12.4  HCT 42.3 39.6  MCV 91.6 91.7  MCH 29.2 28.7  MCHC 31.9 31.3  RDW 13.7 13.4  PLT 304 253    Radiology    DG Chest 2 View  Result Date: 10/02/2020 CLINICAL DATA:  Left side chest pain that radiates to the back on exertion x4 days EXAM: CHEST - 2 VIEW COMPARISON:  Chest radiograph August 17, 2020. FINDINGS: The heart size and mediastinal contours are stable. Prior median sternotomy and coronary artery bypass grafting. No focal consolidation. No pleural effusion. No pneumothorax. The visualized skeletal structures are unchanged. IMPRESSION: No acute cardiopulmonary disease. Electronically Signed   By: August 19, 2020 MD   On: 10/02/2020 16:29    Patient Profile     75 y.o. female with past medical history of coronary artery disease status post coronary artery bypass and graft December 2021, hyperlipidemia admitted with  chest pain.  Patient had emergent coronary artery bypass and graft December 2021 with saphenous vein graft to LAD, sequential saphenous vein graft to diagonal and obtuse marginal.  Had chest pain following procedure and catheterization January 3 showed 80% circumflex, occluded LAD followed by 80% stenosis, saphenous vein graft to LAD occluded and 40% right coronary artery.  Ejection fraction 40%.  Patient was treated medically and if persistent symptoms noted PCI of the LAD could be attempted.  Echocardiogram January 2022 showed normal LV function, grade 1 diastolic dysfunction.  Assessment & Plan    1 unstable angina-has again developed exertional chest pain (minimal exertion).  Her isosorbide was increased but she continues to have symptoms.  Continue aspirin, brilinta, beta-blocker, statin and ranolazine.  Plan is for cardiac catheterization  today.  Hopefully PCI of LAD will be possible to improve symptoms.  The risk and benefits of cardiac catheterization including myocardial infarction, CVA and death discussed and she agrees to proceed.  2 hyperlipidemia-continue statin.  For questions or updates, please contact [facility] Please consult [email or web address] for contact info under        Signed, Olga Millers, MD  10/03/2020, 9:16 AM

## 2020-10-04 ENCOUNTER — Inpatient Hospital Stay (HOSPITAL_COMMUNITY): Payer: Medicare HMO

## 2020-10-04 ENCOUNTER — Encounter (HOSPITAL_COMMUNITY): Payer: Self-pay | Admitting: *Deleted

## 2020-10-04 ENCOUNTER — Encounter (HOSPITAL_COMMUNITY): Payer: Self-pay | Admitting: Cardiovascular Disease

## 2020-10-04 DIAGNOSIS — I724 Aneurysm of artery of lower extremity: Secondary | ICD-10-CM

## 2020-10-04 LAB — CBC WITH DIFFERENTIAL/PLATELET
Abs Immature Granulocytes: 0.04 10*3/uL (ref 0.00–0.07)
Abs Immature Granulocytes: 0.04 10*3/uL (ref 0.00–0.07)
Basophils Absolute: 0 10*3/uL (ref 0.0–0.1)
Basophils Absolute: 0 10*3/uL (ref 0.0–0.1)
Basophils Relative: 0 %
Basophils Relative: 0 %
Eosinophils Absolute: 0.1 10*3/uL (ref 0.0–0.5)
Eosinophils Absolute: 0.1 10*3/uL (ref 0.0–0.5)
Eosinophils Relative: 1 %
Eosinophils Relative: 1 %
HCT: 36.3 % (ref 36.0–46.0)
HCT: 37.3 % (ref 36.0–46.0)
Hemoglobin: 11.3 g/dL — ABNORMAL LOW (ref 12.0–15.0)
Hemoglobin: 11.3 g/dL — ABNORMAL LOW (ref 12.0–15.0)
Immature Granulocytes: 0 %
Immature Granulocytes: 0 %
Lymphocytes Relative: 10 %
Lymphocytes Relative: 16 %
Lymphs Abs: 0.9 10*3/uL (ref 0.7–4.0)
Lymphs Abs: 1.6 10*3/uL (ref 0.7–4.0)
MCH: 28.2 pg (ref 26.0–34.0)
MCH: 28.7 pg (ref 26.0–34.0)
MCHC: 30.3 g/dL (ref 30.0–36.0)
MCHC: 31.1 g/dL (ref 30.0–36.0)
MCV: 92.1 fL (ref 80.0–100.0)
MCV: 93 fL (ref 80.0–100.0)
Monocytes Absolute: 0.5 10*3/uL (ref 0.1–1.0)
Monocytes Absolute: 0.7 10*3/uL (ref 0.1–1.0)
Monocytes Relative: 5 %
Monocytes Relative: 7 %
Neutro Abs: 7.5 10*3/uL (ref 1.7–7.7)
Neutro Abs: 7.6 10*3/uL (ref 1.7–7.7)
Neutrophils Relative %: 76 %
Neutrophils Relative %: 84 %
Platelets: 162 10*3/uL (ref 150–400)
Platelets: 221 10*3/uL (ref 150–400)
RBC: 3.94 MIL/uL (ref 3.87–5.11)
RBC: 4.01 MIL/uL (ref 3.87–5.11)
RDW: 13.6 % (ref 11.5–15.5)
RDW: 13.6 % (ref 11.5–15.5)
WBC: 9.2 10*3/uL (ref 4.0–10.5)
WBC: 9.9 10*3/uL (ref 4.0–10.5)
nRBC: 0 % (ref 0.0–0.2)
nRBC: 0 % (ref 0.0–0.2)

## 2020-10-04 LAB — BASIC METABOLIC PANEL
Anion gap: 10 (ref 5–15)
BUN: 11 mg/dL (ref 8–23)
CO2: 19 mmol/L — ABNORMAL LOW (ref 22–32)
Calcium: 8.1 mg/dL — ABNORMAL LOW (ref 8.9–10.3)
Chloride: 112 mmol/L — ABNORMAL HIGH (ref 98–111)
Creatinine, Ser: 0.87 mg/dL (ref 0.44–1.00)
GFR, Estimated: >60 mL/min (ref >60)
Glucose, Bld: 101 mg/dL — ABNORMAL HIGH (ref 70–99)
Potassium: 3.9 mmol/L (ref 3.5–5.1)
Sodium: 141 mmol/L (ref 135–145)

## 2020-10-04 LAB — CBC
HCT: 35.5 % — ABNORMAL LOW (ref 36.0–46.0)
Hemoglobin: 11 g/dL — ABNORMAL LOW (ref 12.0–15.0)
MCH: 28.7 pg (ref 26.0–34.0)
MCHC: 31 g/dL (ref 30.0–36.0)
MCV: 92.7 fL (ref 80.0–100.0)
Platelets: 199 10*3/uL (ref 150–400)
RBC: 3.83 MIL/uL — ABNORMAL LOW (ref 3.87–5.11)
RDW: 13.7 % (ref 11.5–15.5)
WBC: 8.7 10*3/uL (ref 4.0–10.5)
nRBC: 0 % (ref 0.0–0.2)

## 2020-10-04 LAB — TYPE AND SCREEN
ABO/RH(D): B POS
Antibody Screen: NEGATIVE

## 2020-10-04 MED ORDER — IOHEXOL 300 MG/ML  SOLN
100.0000 mL | Freq: Once | INTRAMUSCULAR | Status: AC | PRN
  Administered 2020-10-04: 100 mL via INTRAVENOUS

## 2020-10-04 MED ORDER — HYDROCODONE-ACETAMINOPHEN 5-325 MG PO TABS: 1.0000 | ORAL_TABLET | ORAL | Status: DC | PRN

## 2020-10-04 NOTE — Progress Notes (Signed)
Pseudo check has been completed.   Preliminary results in CV Proc.   Blanch Media 10/04/2020 9:07 AM

## 2020-10-04 NOTE — Plan of Care (Signed)
  Problem: Health Behavior/Discharge Planning: Goal: Ability to manage health-related needs will improve Outcome: Progressing   Problem: Clinical Measurements: Goal: Ability to maintain clinical measurements within normal limits will improve Outcome: Progressing Goal: Will remain free from infection Outcome: Progressing Goal: Diagnostic test results will improve Outcome: Progressing Goal: Respiratory complications will improve Outcome: Progressing Goal: Cardiovascular complication will be avoided Outcome: Progressing   Problem: Activity: Goal: Risk for activity intolerance will decrease Outcome: Progressing   Problem: Nutrition: Goal: Adequate nutrition will be maintained Outcome: Progressing   Problem: Coping: Goal: Level of anxiety will decrease Outcome: Progressing   Problem: Elimination: Goal: Will not experience complications related to bowel motility Outcome: Progressing Goal: Will not experience complications related to urinary retention Outcome: Progressing   Problem: Pain Managment: Goal: General experience of comfort will improve Outcome: Progressing   Problem: Safety: Goal: Ability to remain free from injury will improve Outcome: Progressing   Problem: Skin Integrity: Goal: Risk for impaired skin integrity will decrease Outcome: Progressing   Problem: Education: Goal: Understanding of CV disease, CV risk reduction, and recovery process will improve Outcome: Progressing Goal: Individualized Educational Video(s) Outcome: Progressing   Problem: Activity: Goal: Ability to return to baseline activity level will improve Outcome: Progressing   Problem: Cardiovascular: Goal: Ability to achieve and maintain adequate cardiovascular perfusion will improve Outcome: Progressing Goal: Vascular access site(s) Level 0-1 will be maintained Outcome: Progressing   Problem: Health Behavior/Discharge Planning: Goal: Ability to safely manage health-related needs  after discharge will improve Outcome: Progressing   

## 2020-10-04 NOTE — Progress Notes (Signed)
Pt.c/o again of back pain & on femoral site & noted hematoma increased  & B/P went down again to 72/42;HR=55;Applied manual pressure again on the rt.groin & Dr.Ahkter was made aware & ordered to give another 500 cc NS bolus & CBC results relayed to him & ordered CT scan of abd.& pelvis w/ & w/o contrast stat.

## 2020-10-04 NOTE — Progress Notes (Signed)
Pt.called & c/o nausea & pain on rt.groin & back pain  ,with cold sweat & noted pt's HR -39;b/p=54/37;r=16;o2 sat.=99% on RA;Rapid response was called ;NS 250cc bolus started ;Zofran 4mg  IV given .Also noted slight increase in hematoma .Manual compression done for 15 minutes.& got softened. And @2134 ,Pt's HR went up to 79,B/P= 85/64;02 sat-97 % RA. MD on call was called & made aware & said he is coming to see her.

## 2020-10-04 NOTE — Progress Notes (Signed)
.  Brought down pt. to CT scan in bed & with monitor for CT of abd.& pelvis with & without  contrast .

## 2020-10-04 NOTE — Progress Notes (Signed)
MD on call Dr.Ahkter came to see pt.odered cbc stat & to give another 250 cc NS bolus.

## 2020-10-04 NOTE — Plan of Care (Signed)
  Problem: Nutrition: Goal: Adequate nutrition will be maintained Outcome: Progressing   Problem: Elimination: Goal: Will not experience complications related to bowel motility Outcome: Progressing   Problem: Pain Managment: Goal: General experience of comfort will improve Outcome: Progressing   

## 2020-10-04 NOTE — Progress Notes (Signed)
Pt's  B/P =122/66;HR=63;Morphine 2mg  IV given as ordered@ 2330. Hematoma got softened .Will continue to monitor pt.

## 2020-10-04 NOTE — Progress Notes (Signed)
Results of CTscan of abd./pelvis relayed to Dr. Deforest Hoyles .Will continue to monitor pt.

## 2020-10-04 NOTE — Progress Notes (Addendum)
Progress Note  Patient Name: Brenda Wilkins Date of Encounter: 10/04/2020  [facility] Cardiologist: Freada Bergeron, MD   Subjective   Pt denies CP or dyspnea; complains of right groin and back pain  Inpatient Medications    Scheduled Meds: . aspirin  81 mg Oral Daily  . atorvastatin  80 mg Oral Daily  . heparin  5,000 Units Subcutaneous Q8H  . isosorbide mononitrate  60 mg Oral Daily  . losartan  25 mg Oral Daily  . metoprolol succinate  12.5 mg Oral Daily  . ranolazine  500 mg Oral BID  . sodium chloride flush  3 mL Intravenous Q12H  . ticagrelor  90 mg Oral BID   Continuous Infusions: . sodium chloride     PRN Meds: sodium chloride, acetaminophen, morphine injection, nitroGLYCERIN, ondansetron (ZOFRAN) IV, ondansetron (ZOFRAN) IV, sodium chloride flush   Vital Signs    Vitals:   10/04/20 0538 10/04/20 0549 10/04/20 0550 10/04/20 0602  BP: (!) 90/53 (!) 105/53  (!) 94/53  Pulse: 67 65 62 60  Resp: _0 Temp: 98.4 F (36.9 C)   98.5 F (36.9 C)  TempSrc: Oral   Oral  SpO2: 100% 100% 100% 91%  Weight:      Height:        Intake/Output Summary (Last 24 hours) at 10/04/2020 0736 Last data filed at 10/04/2020 7342 Gross per 24 hour  Intake 1767.5 ml  Output 450 ml  Net 1317.5 ml   Last 3 Weights 10/02/2020 10/02/2020 09/27/2020  Weight (lbs) 141 lb 1.6 oz 142 lb 12.7 oz 142 lb 10.2 oz  Weight (kg) 64.003 kg 64.77 kg 64.7 kg      Telemetry    Sinus - Personally Reviewed   Physical Exam   GEN: WD NAD Neck: No JVD, supple Cardiac: RRR Respiratory: CTA GI: Soft, NT/ND MS: No edema; right groin with hematoma.  I cannot appreciate a bruit.  Tender to palpation. Neuro:  Grossly intact Psych: Normal affect   Labs    High Sensitivity Troponin:   Recent Labs  Lab 10/02/20 1609 10/02/20 1841  TROPONINIHS 16 14      Chemistry Recent Labs  Lab 10/02/20 1609 10/03/20 0212 10/04/20 0013  NA 141 142 141  K 3.6 4.1 3.9  CL 106 110 112*   CO2 24 20* 19*  GLUCOSE 113* 96 101*  BUN _1 CREATININE 0.87 0.84 0.87  CALCIUM 9.0 8.6* 8.1*  GFRNONAA >60 >60 >60  ANIONGAP _2 Hematology Recent Labs  Lab 10/03/20 2250 10/04/20 0013 10/04/20 0542  WBC 7.6 9.2 9.9  RBC 3.81* 3.94 4.01  HGB 11.2* 11.3* 11.3*  HCT 35.2* 36.3 37.3  MCV 92.4 92.1 93.0  MCH 29.4 28.7 28.2  MCHC 31.8 31.1 30.3  RDW 13.6 13.6 13.6  PLT 207 162 221    Radiology    CT ABDOMEN PELVIS W WO CONTRAST  Result Date: 10/04/2020 CLINICAL DATA:  Acute, nonlocalized abdominal pain, status post cardiac catheterization, right hip pain and swelling EXAM: CT ABDOMEN AND PELVIS WITHOUT AND WITH CONTRAST TECHNIQUE: Multidetector CT imaging of the abdomen and pelvis was performed following the standard protocol before and following the bolus administration of intravenous contrast. CONTRAST:  136m OMNIPAQUE IOHEXOL 300 MG/ML  SOLN COMPARISON:  None. FINDINGS: Lower chest: The visualized lung bases are clear. The visualized heart and pericardium are unremarkable. Hepatobiliary: 2 small subcentimeter cysts are seen within the right hepatic  lobe. The liver is otherwise unremarkable. Probable vicarious excretion of contrast within the gallbladder. The gallbladder is otherwise unremarkable. No intra or extrahepatic biliary ductal dilation. Pancreas: Unremarkable Spleen: Unremarkable Adrenals/Urinary Tract: The adrenal glands are unremarkable. 5.7 cm simple exophytic cortical cyst arises from the interpolar region of the right kidney. The kidneys are otherwise unremarkable. The bladder is distended, but is otherwise unremarkable. Stomach/Bowel: Mild descending colonic diverticulosis. There is mild focal inflammatory change surrounding a single diverticulum involving the mid descending colon, best seen on axial image # 37 and sagittal image # 160 most in keeping with very mild acute or subacute descending colonic diverticulitis. There is background moderate  descending colonic diverticulosis. The stomach, small bowel, and large bowel are otherwise unremarkable. No evidence of obstruction. No free intraperitoneal gas or fluid. Appendix normal. Vascular/Lymphatic: Moderate aortoiliac atherosclerotic calcification is present. No aortic aneurysm. Particularly prominent atherosclerotic calcification is seen at the origin of the left renal artery and left common iliac artery. There is extensive infiltrative high attenuation material within the right groin in anterior right thigh compatible with subcutaneous hemorrhage. There is, additionally, and enhancing 6 mm nodule within the subcutaneous tissues anterolateral to the right common femoral artery compatible with a small pseudoaneurysm. Infiltrative hemorrhage does not extend into the retroperitoneum or peritoneal cavity. No pathologic adenopathy within the abdomen and pelvis. Reproductive: Uterus and bilateral adnexa are unremarkable. Other: Tiny fat containing umbilical hernia.  Rectum unremarkable. Musculoskeletal: Degenerative changes are seen within the lumbar spine. No acute bone abnormality. No lytic or blastic bone lesions are identified. IMPRESSION: Extensive interstitial high attenuation material within the anterior right thigh and right groin compatible with subcutaneous hemorrhage. Superimposed patent 6 mm pseudoaneurysm anterolateral to the right common femoral artery. No active extravasation identified. Very mild acute or subacute uncomplicated descending colonic diverticulitis. Peripheral vascular disease with prominent atherosclerotic calcification at the origin of the left renal and left common iliac arteries. The degree of stenosis is not well assessed on this non arteriographic study. These results will be called to the ordering clinician or representative by the Radiologist Assistant, and communication documented in the PACS or [employer]. Aortic Atherosclerosis (ICD10-I70.0). Electronically Signed    By: Fidela Salisbury MD   On: 10/04/2020 01:10   DG Chest 2 View  Result Date: 10/02/2020 CLINICAL DATA:  Left side chest pain that radiates to the back on exertion x4 days EXAM: CHEST - 2 VIEW COMPARISON:  Chest radiograph August 17, 2020. FINDINGS: The heart size and mediastinal contours are stable. Prior median sternotomy and coronary artery bypass grafting. No focal consolidation. No pleural effusion. No pneumothorax. The visualized skeletal structures are unchanged. IMPRESSION: No acute cardiopulmonary disease. Electronically Signed   By: Dahlia Bailiff MD   On: 10/02/2020 16:29   CARDIAC CATHETERIZATION  Result Date: 10/03/2020  Origin lesion is 100% stenosed.  Ost Cx to Prox Cx lesion is 80% stenosed.  Ost LAD to Mid LAD lesion is 100% stenosed.  Origin to Prox Graft lesion before 1st Diag is 95% stenosed.  Origin to Prox Graft lesion between 1st Diag and 3rd Mrg is 100% stenosed.  A drug-eluting stent was successfully placed using a STENT RESOLUTE ONYX 4.5X22.  Post intervention, there is a 10% residual stenosis.  The left ventricular ejection fraction is 50-55% by visual estimate.  The left ventricular systolic function is normal.  LV end diastolic pressure is normal.  Annaliese Saez is a 75 y.o. female  [id number] LOCATION:  FACILITY: [facility] PHYSICIAN: Quay Burow, M.D. 08-28-1945  DATE OF PROCEDURE:  10/03/2020 DATE OF DISCHARGE: CARDIAC CATHETERIZATION / PCI DES SVG --DIAG History obtained from chart review.76 y.o. female with past medical history of coronary artery disease status post coronary artery bypass and graft December 2021, hyperlipidemia admitted with chest pain.  Patient had emergent coronary artery bypass and graft December 2021 with saphenous vein graft to LAD, sequential saphenous vein graft to diagonal and obtuse marginal.  Had chest pain following procedure and catheterization January 3 showed 80% circumflex, occluded LAD followed by 80% stenosis, saphenous vein graft to LAD  occluded and 40% right coronary artery.  Ejection fraction 40%.  Patient was treated medically and if persistent symptoms noted PCI of the LAD could be attempted.  Echocardiogram January 2022 showed normal LV function, grade 1 diastolic dysfunction.  She has been having accelerated angina.  She was admitted for this.  Her enzymes were negative.  She was heparinized presents now for diagnostic coronary angiography. PROCEDURE DESCRIPTION: The patient was brought to the second floor [facility] cath lab in the postabsorptive state.  She was premedicated with IV Versed and fentanyl.  Her right groin was prepped and shaved in usual sterile fashion. Xylocaine 1% was used for local anesthesia. A 5 French sheath was inserted into the right common femoral artery using standard Seldinger technique.  5 French right left second Silastic catheters long 5 inch pigtail catheter used for selective coronary angiography, site of vein graft angiography, subselective left internal mammary artery angiography and left ventriculography.  Isovue-used for the entirety of the case.  Retroaortic, ventricular and pullback pressures were recorded. Patient received 7 calciums of heparin with an ACT of 279.  ICU-used for the entirety of the intervention.  Retrograde aortic pressures monitored during the case.  Using a 6 [place] JR4 sidehole guide catheter along with a 0.14 [place] guidewire and a 2.5 mm x 15 mm long the ostium of the SVG to the diagonal branch was crossed fairly easily and predilated.  Following this a 4.5 mm x 22 mm long Medtronic resolute Onyx drug-eluting stent was then carefully positioned across the aorto ostium of the vein graft and deployed at 16 atm.  There was an obvious "dog bone" area that was resistant to complete stent deployment.  I then postdilated this area with a 4.5 mm x 10 mm long noncompliant balloon to 16 atm followed by a 4 mm x 8 mm noncompliant balloon to 16 atm (4.1 mm) resulting reduction of a 95%  aorto ostial stenosis to less than 20% residual.  Most concerned that this may have been the area of a stitch and did not want to potentially cause a perforation.  There was excellent distal flow. Following this I performed DFR of the origin of the circumflex using a 6 French XB 3.5 cm guide catheter along with an Abbott DFR wire which was 0.99, 0.97 suggesting that the proximal circumflex was not physiologically significant.   Successful PCI drug-eluting stenting of the origin of the diagonal branch SVG with occlusion of the continuation of the diagonal to the obtuse marginal branch.  The DFR of the proximal native circumflex was 0.99 suggesting this was not physiologically significant.  There was dampening at the origin of the dominant RCA which had been demonstrated in several prior cardiac cath but no obstructive disease was noted.  The sheath was secured in place.  The patient was already on aspirin and ticagrelor.  The sheath will be removed once ACT falls below 170 pressure held.  Patient  will be hydrated overnight, discharged home in the morning and follow-up with Dr. Johney Frame.` Quay Burow. MD, [facility] 10/03/2020 4:08 PM    Patient Profile     75 y.o. female with past medical history of coronary artery disease status post coronary artery bypass and graft December 2021, hyperlipidemia admitted with chest pain.  Patient had emergent coronary artery bypass and graft December 2021 with saphenous vein graft to LAD, sequential saphenous vein graft to diagonal and obtuse marginal.  Had chest pain following procedure and catheterization January 3 showed 80% circumflex, occluded LAD followed by 80% stenosis, saphenous vein graft to LAD occluded and 40% right coronary artery.  Ejection fraction 40%.  Patient was treated medically and if persistent symptoms noted PCI of the LAD could be attempted.  Echocardiogram January 2022 showed normal LV function, grade 1 diastolic dysfunction.  Assessment & Plan    1  coronary artery disease-patient is now status post PCI of the saphenous vein graft to the first diagonal.  He was also noted the graft between the first diagonal and marginal is occluded.  Continue aspirin, Brilinta and statin.  Continue low-dose metoprolol.  Blood pressure is low and we will hold losartan and isosorbide.  Discontinue ranolazine.  2 right groin hematoma-CT demonstrates right thigh and groin hematoma.  There is also 6 mm pseudoaneurysm noted.  Hemoglobin is unchanged this morning at 11.3.  We will hold subcutaneous heparin for now.  We will arrange ultrasound of right groin and possible compression of pseudoaneurysm if possible.  Recheck hemoglobin tomorrow morning.  3 hyperlipidemia-continue statin.  For questions or updates, please contact [facility] Please consult [email or web address] for contact info under        Signed, Kirk Ruths, MD  10/04/2020, 7:36 AM

## 2020-10-04 NOTE — Progress Notes (Signed)
   10/03/20 2132  Assess: MEWS Score  Temp 98 F (36.7 C)  BP (!) 54/37  Pulse Rate (!) 39  ECG Heart Rate (!) 39  Resp 16  Level of Consciousness Alert  SpO2 99 %  O2 Device Room Air  Assess: MEWS Score  MEWS Temp 0  MEWS Systolic 3  MEWS Pulse 2  MEWS RR 0  MEWS LOC 0  MEWS Score 5  MEWS Score Color Red  Assess: if the MEWS score is Yellow or Red  Were vital signs taken at a resting state? Yes  Focused Assessment No change from prior assessment  Early Detection of Sepsis Score *See Row Information* Low  MEWS guidelines implemented *See Row Information* Yes  Treat  MEWS Interventions Administered prn meds/treatments;Escalated (See documentation below)  Pain Scale 0-10  Pain Score 9  Pain Type Acute pain  Pain Location Groin  Pain Orientation Right  Pain Radiating Towards back  Pain Descriptors / Indicators Aching  Pain Frequency Constant  Pain Onset Sudden  Patients Stated Pain Goal 0  Pain Intervention(s) MD notified (Comment)  Take Vital Signs  Increase Vital Sign Frequency  Red: Q 1hr X 4 then Q 4hr X 4, if remains red, continue Q 4hrs  Escalate  MEWS: Escalate Red: discuss with charge nurse/RN and provider, consider discussing with RRT  Notify: Charge Nurse/RN  Name of Charge Nurse/RN Notified Heather RN  Date Charge Nurse/RN Notified 10/03/20  Time Charge Nurse/RN Notified 2140  Notify: Provider  Provider Name/Title Dr.Akhter  Date Provider Notified 10/03/20  Time Provider Notified 2150  Notification Type Call  Notification Reason Change in status  Provider response See new orders  Date of Provider Response 10/03/20  Time of Provider Response 2155  Notify: Rapid Response  Name of Rapid Response RN Notified David RRT  Date Rapid Response Notified 10/03/20  Time Rapid Response Notified 2135

## 2020-10-05 ENCOUNTER — Encounter (HOSPITAL_COMMUNITY): Payer: Medicare HMO

## 2020-10-05 LAB — CBC WITH DIFFERENTIAL/PLATELET
Abs Immature Granulocytes: 0.01 10*3/uL (ref 0.00–0.07)
Basophils Absolute: 0 10*3/uL (ref 0.0–0.1)
Basophils Relative: 0 %
Eosinophils Absolute: 0.2 10*3/uL (ref 0.0–0.5)
Eosinophils Relative: 2 %
HCT: 32.4 % — ABNORMAL LOW (ref 36.0–46.0)
Hemoglobin: 10.3 g/dL — ABNORMAL LOW (ref 12.0–15.0)
Immature Granulocytes: 0 %
Lymphocytes Relative: 22 %
Lymphs Abs: 1.8 10*3/uL (ref 0.7–4.0)
MCH: 29.3 pg (ref 26.0–34.0)
MCHC: 31.8 g/dL (ref 30.0–36.0)
MCV: 92.3 fL (ref 80.0–100.0)
Monocytes Absolute: 0.7 10*3/uL (ref 0.1–1.0)
Monocytes Relative: 8 %
Neutro Abs: 5.7 10*3/uL (ref 1.7–7.7)
Neutrophils Relative %: 68 %
Platelets: 203 10*3/uL (ref 150–400)
RBC: 3.51 MIL/uL — ABNORMAL LOW (ref 3.87–5.11)
RDW: 13.9 % (ref 11.5–15.5)
WBC: 8.4 10*3/uL (ref 4.0–10.5)
nRBC: 0 % (ref 0.0–0.2)

## 2020-10-05 LAB — BASIC METABOLIC PANEL
Anion gap: 8 (ref 5–15)
BUN: 12 mg/dL (ref 8–23)
CO2: 22 mmol/L (ref 22–32)
Calcium: 8.1 mg/dL — ABNORMAL LOW (ref 8.9–10.3)
Chloride: 109 mmol/L (ref 98–111)
Creatinine, Ser: 0.95 mg/dL (ref 0.44–1.00)
GFR, Estimated: >60 mL/min (ref >60)
Glucose, Bld: 100 mg/dL — ABNORMAL HIGH (ref 70–99)
Potassium: 3.8 mmol/L (ref 3.5–5.1)
Sodium: 139 mmol/L (ref 135–145)

## 2020-10-05 NOTE — Plan of Care (Signed)
  Problem: Health Behavior/Discharge Planning: Goal: Ability to manage health-related needs will improve Outcome: Progressing   Problem: Clinical Measurements: Goal: Ability to maintain clinical measurements within normal limits will improve Outcome: Progressing Goal: Will remain free from infection Outcome: Progressing Goal: Diagnostic test results will improve Outcome: Progressing Goal: Respiratory complications will improve Outcome: Progressing Goal: Cardiovascular complication will be avoided Outcome: Progressing   Problem: Activity: Goal: Risk for activity intolerance will decrease Outcome: Progressing   Problem: Nutrition: Goal: Adequate nutrition will be maintained Outcome: Progressing   Problem: Coping: Goal: Level of anxiety will decrease Outcome: Progressing   Problem: Elimination: Goal: Will not experience complications related to bowel motility Outcome: Progressing Goal: Will not experience complications related to urinary retention Outcome: Progressing   Problem: Pain Managment: Goal: General experience of comfort will improve Outcome: Progressing   Problem: Safety: Goal: Ability to remain free from injury will improve Outcome: Progressing   Problem: Skin Integrity: Goal: Risk for impaired skin integrity will decrease Outcome: Progressing   Problem: Education: Goal: Understanding of CV disease, CV risk reduction, and recovery process will improve Outcome: Progressing Goal: Individualized Educational Video(s) Outcome: Progressing   Problem: Activity: Goal: Ability to return to baseline activity level will improve Outcome: Progressing   Problem: Cardiovascular: Goal: Ability to achieve and maintain adequate cardiovascular perfusion will improve Outcome: Progressing Goal: Vascular access site(s) Level 0-1 will be maintained Outcome: Progressing   Problem: Health Behavior/Discharge Planning: Goal: Ability to safely manage health-related needs  after discharge will improve Outcome: Progressing   

## 2020-10-05 NOTE — Plan of Care (Signed)
Problem: Health Behavior/Discharge Planning: Goal: Ability to manage health-related needs will improve 10/05/2020 1154 by Durward Fortes, RN Outcome: Adequate for Discharge 10/05/2020 [phone] by Durward Fortes, RN Outcome: Progressing   Problem: Clinical Measurements: Goal: Ability to maintain clinical measurements within normal limits will improve 10/05/2020 1154 by Durward Fortes, RN Outcome: Adequate for Discharge 10/05/2020 [phone] by Durward Fortes, RN Outcome: Progressing Goal: Will remain free from infection 10/05/2020 1154 by Durward Fortes, RN Outcome: Adequate for Discharge 10/05/2020 [phone] by Durward Fortes, RN Outcome: Progressing Goal: Diagnostic test results will improve 10/05/2020 1154 by Durward Fortes, RN Outcome: Adequate for Discharge 10/05/2020 [phone] by Durward Fortes, RN Outcome: Progressing Goal: Respiratory complications will improve 10/05/2020 1154 by Durward Fortes, RN Outcome: Adequate for Discharge 10/05/2020 [phone] by Durward Fortes, RN Outcome: Progressing Goal: Cardiovascular complication will be avoided 10/05/2020 1154 by Durward Fortes, RN Outcome: Adequate for Discharge 10/05/2020 [phone] by Durward Fortes, RN Outcome: Progressing   Problem: Activity: Goal: Risk for activity intolerance will decrease 10/05/2020 1154 by Durward Fortes, RN Outcome: Adequate for Discharge 10/05/2020 [phone] by Durward Fortes, RN Outcome: Progressing   Problem: Nutrition: Goal: Adequate nutrition will be maintained 10/05/2020 1154 by Durward Fortes, RN Outcome: Adequate for Discharge 10/05/2020 [phone] by Durward Fortes, RN Outcome: Progressing   Problem: Coping: Goal: Level of anxiety will decrease 10/05/2020 1154 by Durward Fortes, RN Outcome: Adequate for Discharge 10/05/2020 [phone] by Durward Fortes, RN Outcome: Progressing   Problem: Elimination: Goal: Will not experience complications related to bowel motility 10/05/2020 1154 by Durward Fortes, RN Outcome: Adequate for Discharge 10/05/2020 [phone] by  Durward Fortes, RN Outcome: Progressing Goal: Will not experience complications related to urinary retention 10/05/2020 1154 by Durward Fortes, RN Outcome: Adequate for Discharge 10/05/2020 [phone] by Durward Fortes, RN Outcome: Progressing   Problem: Pain Managment: Goal: General experience of comfort will improve 10/05/2020 1154 by Durward Fortes, RN Outcome: Adequate for Discharge 10/05/2020 [phone] by Durward Fortes, RN Outcome: Progressing   Problem: Safety: Goal: Ability to remain free from injury will improve 10/05/2020 1154 by Durward Fortes, RN Outcome: Adequate for Discharge 10/05/2020 [phone] by Durward Fortes, RN Outcome: Progressing   Problem: Skin Integrity: Goal: Risk for impaired skin integrity will decrease 10/05/2020 1154 by Durward Fortes, RN Outcome: Adequate for Discharge 10/05/2020 [phone] by Durward Fortes, RN Outcome: Progressing   Problem: Education: Goal: Understanding of CV disease, CV risk reduction, and recovery process will improve 10/05/2020 1154 by Durward Fortes, RN Outcome: Adequate for Discharge 10/05/2020 [phone] by Durward Fortes, RN Outcome: Progressing Goal: Individualized Educational Video(s) 10/05/2020 1154 by Durward Fortes, RN Outcome: Adequate for Discharge 10/05/2020 [phone] by Durward Fortes, RN Outcome: Progressing   Problem: Activity: Goal: Ability to return to baseline activity level will improve 10/05/2020 1154 by Durward Fortes, RN Outcome: Adequate for Discharge 10/05/2020 [phone] by Durward Fortes, RN Outcome: Progressing   Problem: Cardiovascular: Goal: Ability to achieve and maintain adequate cardiovascular perfusion will improve 10/05/2020 1154 by Durward Fortes, RN Outcome: Adequate for Discharge 10/05/2020 [phone] by Durward Fortes, RN Outcome: Progressing Goal: Vascular access site(s) Level 0-1 will be maintained 10/05/2020 1154 by Durward Fortes, RN Outcome: Adequate for Discharge 10/05/2020 [phone] by Durward Fortes, RN Outcome: Progressing   Problem: Health  Behavior/Discharge Planning: Goal: Ability to safely manage health-related needs after discharge will improve  10/05/2020 1154 by Durward Fortes, RN Outcome: Adequate for Discharge 10/05/2020 [phone] by Durward Fortes, RN Outcome: Progressing

## 2020-10-05 NOTE — Discharge Summary (Signed)
Discharge Summary    Patient ID: Brenda Wilkins MRN: [id number]; DOB: [birth year]  Admit date: 10/02/2020 Discharge date: 10/05/2020  PCP:  Patient, No Pcp Per   [facility]  Cardiologist:  Freada Bergeron, MD    Discharge Diagnoses    Principal Problem:   Unstable angina [facility]) Active Problems:   NSTEMI (non-ST elevated myocardial infarction) ([place])   Hyperlipidemia   S/P CABG x 3    Diagnostic Studies/Procedures    [place] PSA 10/04/20: A mixed echogenic structure measuring approximately 4.1 cm x 1.8 cm is  visualized at the right groin with ultrasound characteristics of a  hematoma.   ____________________  Left heart cath 10/03/20:  Origin lesion is 100% stenosed.  Ost Cx to Prox Cx lesion is 80% stenosed.  Ost LAD to Mid LAD lesion is 100% stenosed.  Origin to Prox Graft lesion before 1st Diag is 95% stenosed.  Origin to Prox Graft lesion between 1st Diag and 3rd Mrg is 100% stenosed.  A drug-eluting stent was successfully placed using a STENT RESOLUTE ONYX 4.5X22.  Post intervention, there is a 10% residual stenosis.  The left ventricular ejection fraction is 50-55% by visual estimate.  The left ventricular systolic function is normal.  LV end diastolic pressure is normal.  IMPRESSION: Successful PCI drug-eluting stenting of the origin of the diagonal branch SVG with occlusion of the continuation of the diagonal to the obtuse marginal branch.  The DFR of the proximal native circumflex was 0.99 suggesting this was not physiologically significant.  There was dampening at the origin of the dominant RCA which had been demonstrated in several prior cardiac cath but no obstructive disease was noted.  The sheath was secured in place.  The patient was already on aspirin and ticagrelor.  The sheath will be removed once ACT falls below 170 pressure held.  Patient will be hydrated overnight, discharged home in the morning and follow-up with Dr.  Johney Frame.` _____________   Echo 08/08/20: 1. Septal hypokinesis consistent with post-operative state. Left  ventricular ejection fraction, by estimation, is 50 to 55%. The left  ventricle has low normal function. The left ventricle demonstrates  regional wall motion abnormalities (see scoring  diagram/findings for description). Left ventricular diastolic parameters  are consistent with Grade I diastolic dysfunction (impaired relaxation).  2. Right ventricular systolic function is normal. The right ventricular  size is normal.  3. The mitral valve is normal in structure. Trivial mitral valve  regurgitation. No evidence of mitral stenosis.  4. The aortic valve is tricuspid. Aortic valve regurgitation is not  visualized. No aortic stenosis is present.  5. The inferior vena cava is normal in size with greater than 50%  respiratory variability, suggesting right atrial pressure of 3 mmHg.   History of Present Illness     Brenda Wilkins is a 75 y.o. female with past medical history of coronary artery disease status post coronary artery bypass and graft December 2021, hyperlipidemia admitted with chest pain.  She had emergent coronary artery bypass and graft December 2021 with saphenous vein graft to LAD, sequential saphenous vein graft to diagonal and obtuse marginal.  Had chest pain following procedure and catheterization August 08 2020 showed 80% circumflex, occluded LAD followed by 80% stenosis, saphenous vein graft to LAD occluded and 40% right coronary artery.  Ejection fraction 40%.  Patient was treated medically and if persistent symptoms noted PCI of the LAD could be attempted.  Echocardiogram January 2022 showed normal LV function, grade 1 diastolic  dysfunction.  She developed exertional chest pain with minimal exertion. Imdur was increased but she continued to have symptoms. ASA, briltina, BB, statin, and ranexa continued.    Hospital Course     Consultants: [facility]  Angina CAD  s/p CABG 2021 She called the after hours line with ongoing chest pain and was advised to come to ER for evaluation. Upon arrival, hs troponin was normal x 2. Given her early graft failure, she was taken back to cath lab and DES placed to occluded proximal SVG-D1. Losartan and imdur were D/C'ed due to hypotension. Low dose toprol was resumed. Ranexa was also discontinued. I confirmed the pateint does have brilinta at home. DAPT with ASA and brilinta.    Right groin hematoma Procedure was complicated by right groin hematoma. Pressure was held. CT demonstrated right thigh and groin hematoma and small pseudoaneurysm. PSA [place] did not show PSA, suspected to be too small to be seen on [place]. She has ambulated with nursing and cardiac rehab today. Hb is 10.3 from 11. Recheck CBC and [place] on follow up.    Hypertension D/C'ed imdur and losartan due to hypotension. Continue to follow OP.   Hyperlipidemia with LDL goal < 70 07/17/2020: Cholesterol 254; HDL 40; LDL Cholesterol 183; Triglycerides 153; VLDL 31 Continue 80 mg lipitor. Check fasting lipids at follow up.      Did the patient have an acute coronary syndrome (MI, NSTEMI, STEMI, etc) this admission?:  No                               Did the patient have a percutaneous coronary intervention (stent / angioplasty)?:  Yes.     Cath/PCI Registry Performance & Quality Measures: 1. Aspirin prescribed? - Yes 2. ADP Receptor Inhibitor (Plavix/Clopidogrel, Brilinta/Ticagrelor or Effient/Prasugrel) prescribed (includes medically managed patients)? - Yes 3. High Intensity Statin (Lipitor 40-80mg  or Crestor 20-40mg ) prescribed? - Yes 4. For EF <40%, was ACEI/ARB prescribed? - No - Reason:  low BP 5. For EF <40%, Aldosterone Antagonist (Spironolactone or Eplerenone) prescribed? - No - Reason:  low bp 6. Cardiac Rehab Phase II ordered? - Yes       _____________  Discharge Vitals Blood pressure (!) 102/51, pulse 84, temperature 98.8 F (37.1 C),  temperature source Oral, resp. rate 15, height 4' 11.75" (1.518 m), weight 67.3 kg, SpO2 100 %.  Filed Weights   10/02/20 1634 10/02/20 1945 10/05/20 0525  Weight: 64.8 kg 64 kg 67.3 kg    Labs & Radiologic Studies    CBC Recent Labs    10/04/20 0542 10/04/20 1302 10/05/20 0236  WBC 9.9 8.7 8.4  NEUTROABS 7.5  --  5.7  HGB 11.3* 11.0* 10.3*  HCT 37.3 35.5* 32.4*  MCV 93.0 92.7 92.3  PLT 221 199 937   Basic Metabolic Panel Recent Labs    10/04/20 0013 10/05/20 0236  NA 141 139  K 3.9 3.8  CL 112* 109  CO2 19* 22  GLUCOSE 101* 100*  BUN 11 12  CREATININE 0.87 0.95  CALCIUM 8.1* 8.1*   Liver Function Tests No results for input(s): AST, ALT, ALKPHOS, BILITOT, PROT, ALBUMIN in the last 72 hours. No results for input(s): LIPASE, AMYLASE in the last 72 hours. High Sensitivity Troponin:   Recent Labs  Lab 10/02/20 1609 10/02/20 1841  TROPONINIHS 16 14    BNP Invalid input(s): POCBNP D-Dimer No results for input(s): DDIMER in the last 72 hours. Hemoglobin A1C  No results for input(s): HGBA1C in the last 72 hours. Fasting Lipid Panel No results for input(s): CHOL, HDL, LDLCALC, TRIG, CHOLHDL, LDLDIRECT in the last 72 hours. Thyroid Function Tests No results for input(s): TSH, T4TOTAL, T3FREE, THYROIDAB in the last 72 hours.  Invalid input(s): FREET3 _____________  CT ABDOMEN PELVIS W WO CONTRAST  Result Date: 10/04/2020 CLINICAL DATA:  Acute, nonlocalized abdominal pain, status post cardiac catheterization, right hip pain and swelling EXAM: CT ABDOMEN AND PELVIS WITHOUT AND WITH CONTRAST TECHNIQUE: Multidetector CT imaging of the abdomen and pelvis was performed following the standard protocol before and following the bolus administration of intravenous contrast. CONTRAST:  OMNIPAQUE IOHEXOL 300 MG/ML  SOLN COMPARISON:  None. FINDINGS: Lower chest: The visualized lung bases are clear. The visualized heart and pericardium are unremarkable. Hepatobiliary: 2 small  subcentimeter cysts are seen within the right hepatic lobe. The liver is otherwise unremarkable. Probable vicarious excretion of contrast within the gallbladder. The gallbladder is otherwise unremarkable. No intra or extrahepatic biliary ductal dilation. Pancreas: Unremarkable Spleen: Unremarkable Adrenals/Urinary Tract: The adrenal glands are unremarkable. 5.7 cm simple exophytic cortical cyst arises from the interpolar region of the right kidney. The kidneys are otherwise unremarkable. The bladder is distended, but is otherwise unremarkable. Stomach/Bowel: Mild descending colonic diverticulosis. There is mild focal inflammatory change surrounding a single diverticulum involving the mid descending colon, best seen on axial image # 37 and sagittal image # 160 most in keeping with very mild acute or subacute descending colonic diverticulitis. There is background moderate descending colonic diverticulosis. The stomach, small bowel, and large bowel are otherwise unremarkable. No evidence of obstruction. No free intraperitoneal gas or fluid. Appendix normal. Vascular/Lymphatic: Moderate aortoiliac atherosclerotic calcification is present. No aortic aneurysm. Particularly prominent atherosclerotic calcification is seen at the origin of the left renal artery and left common iliac artery. There is extensive infiltrative high attenuation material within the right groin in anterior right thigh compatible with subcutaneous hemorrhage. There is, additionally, and enhancing 6 mm nodule within the subcutaneous tissues anterolateral to the right common femoral artery compatible with a small pseudoaneurysm. Infiltrative hemorrhage does not extend into the retroperitoneum or peritoneal cavity. No pathologic adenopathy within the abdomen and pelvis. Reproductive: Uterus and bilateral adnexa are unremarkable. Other: Tiny fat containing umbilical hernia.  Rectum unremarkable. Musculoskeletal: Degenerative changes are seen within the  lumbar spine. No acute bone abnormality. No lytic or blastic bone lesions are identified. IMPRESSION: Extensive interstitial high attenuation material within the anterior right thigh and right groin compatible with subcutaneous hemorrhage. Superimposed patent 6 mm pseudoaneurysm anterolateral to the right common femoral artery. No active extravasation identified. Very mild acute or subacute uncomplicated descending colonic diverticulitis. Peripheral vascular disease with prominent atherosclerotic calcification at the origin of the left renal and left common iliac arteries. The degree of stenosis is not well assessed on this non arteriographic study. These results will be called to the ordering clinician or representative by the Radiologist Assistant, and communication documented in the PACS or [employer]. Aortic Atherosclerosis (ICD10-I70.0). Electronically Signed   By: Helyn Numbers MD   On: 10/04/2020 01:10   DG Chest 2 View  Result Date: 10/02/2020 CLINICAL DATA:  Left side chest pain that radiates to the back on exertion x4 days EXAM: CHEST - 2 VIEW COMPARISON:  Chest radiograph August 17, 2020. FINDINGS: The heart size and mediastinal contours are stable. Prior median sternotomy and coronary artery bypass grafting. No focal consolidation. No pleural effusion. No pneumothorax. The visualized skeletal structures are  unchanged. IMPRESSION: No acute cardiopulmonary disease. Electronically Signed   By: Dahlia Bailiff MD   On: 10/02/2020 16:29   CARDIAC CATHETERIZATION  Result Date: 10/03/2020  Origin lesion is 100% stenosed.  Ost Cx to Prox Cx lesion is 80% stenosed.  Ost LAD to Mid LAD lesion is 100% stenosed.  Origin to Prox Graft lesion before 1st Diag is 95% stenosed.  Origin to Prox Graft lesion between 1st Diag and 3rd Mrg is 100% stenosed.  A drug-eluting stent was successfully placed using a STENT RESOLUTE ONYX 4.5X22.  Post intervention, there is a 10% residual stenosis.  The left  ventricular ejection fraction is 50-55% by visual estimate.  The left ventricular systolic function is normal.  LV end diastolic pressure is normal.  Brenda Wilkins is a 75 y.o. female  [id number] LOCATION:  FACILITY: [facility] PHYSICIAN: Quay Burow, M.D. 1945-10-28 DATE OF PROCEDURE:  10/03/2020 DATE OF DISCHARGE: CARDIAC CATHETERIZATION / PCI DES SVG --DIAG History obtained from chart review.75 y.o. female with past medical history of coronary artery disease status post coronary artery bypass and graft December 2021, hyperlipidemia admitted with chest pain.  Patient had emergent coronary artery bypass and graft December 2021 with saphenous vein graft to LAD, sequential saphenous vein graft to diagonal and obtuse marginal.  Had chest pain following procedure and catheterization January 3 showed 80% circumflex, occluded LAD followed by 80% stenosis, saphenous vein graft to LAD occluded and 40% right coronary artery.  Ejection fraction 40%.  Patient was treated medically and if persistent symptoms noted PCI of the LAD could be attempted.  Echocardiogram January 2022 showed normal LV function, grade 1 diastolic dysfunction.  She has been having accelerated angina.  She was admitted for this.  Her enzymes were negative.  She was heparinized presents now for diagnostic coronary angiography. PROCEDURE DESCRIPTION: The patient was brought to the second floor [facility]iac cath lab in the postabsorptive state.  She was premedicated with IV Versed and fentanyl.  Her right groin was prepped and shaved in usual sterile fashion. Xylocaine 1% was used for local anesthesia. A 5 French sheath was inserted into the right common femoral artery using standard Seldinger technique.  5 French right left second Silastic catheters long 5 inch pigtail catheter used for selective coronary angiography, site of vein graft angiography, subselective left internal mammary artery angiography and left ventriculography.  Isovue-used for the  entirety of the case.  Retroaortic, ventricular and pullback pressures were recorded. Patient received 7 calciums of heparin with an ACT of 279.  ICU-used for the entirety of the intervention.  Retrograde aortic pressures monitored during the case.  Using a 6 [place] JR4 sidehole guide catheter along with a 0.14 [place] guidewire and a 2.5 mm x 15 mm long the ostium of the SVG to the diagonal branch was crossed fairly easily and predilated.  Following this a 4.5 mm x 22 mm long Medtronic resolute Onyx drug-eluting stent was then carefully positioned across the aorto ostium of the vein graft and deployed at 16 atm.  There was an obvious "dog bone" area that was resistant to complete stent deployment.  I then postdilated this area with a 4.5 mm x 10 mm long noncompliant balloon to 16 atm followed by a 4 mm x 8 mm noncompliant balloon to 16 atm (4.1 mm) resulting reduction of a 95% aorto ostial stenosis to less than 20% residual.  Most concerned that this may have been the area of a stitch and did not want to potentially  cause a perforation.  There was excellent distal flow. Following this I performed DFR of the origin of the circumflex using a 6 French XB 3.5 cm guide catheter along with an Abbott DFR wire which was 0.99, 0.97 suggesting that the proximal circumflex was not physiologically significant.   Successful PCI drug-eluting stenting of the origin of the diagonal branch SVG with occlusion of the continuation of the diagonal to the obtuse marginal branch.  The DFR of the proximal native circumflex was 0.99 suggesting this was not physiologically significant.  There was dampening at the origin of the dominant RCA which had been demonstrated in several prior cardiac cath but no obstructive disease was noted.  The sheath was secured in place.  The patient was already on aspirin and ticagrelor.  The sheath will be removed once ACT falls below 170 pressure held.  Patient will be hydrated overnight, discharged home  in the morning and follow-up with Dr. Johney Frame.` Quay Burow. MD, [facility] 10/03/2020 4:08 PM   VAS [place] GROIN PSEUDOANEURYSM  Result Date: 10/04/2020  ARTERIAL PSEUDOANEURYSM  Exam: Right groin Indications: Patient complains of groin pain and bruising. History: S/p catheterization. Performing Technologist: Abram Sander RVS  Examination Guidelines: A complete evaluation includes B-mode imaging, spectral Doppler, color Doppler, and power Doppler as needed of all accessible portions of each vessel. Bilateral testing is considered an integral part of a complete examination. Limited examinations for reoccurring indications may be performed as noted. +------------+----------+---------+------+----------+ Right DuplexPSV (cm/s)Waveform PlaqueComment(s) +------------+----------+---------+------+----------+ CFA             94    triphasic                 +------------+----------+---------+------+----------+  Findings: A mixed echogenic structure measuring approximately 4.1 cm x 1.8 cm is visualized at the right groin with ultrasound characteristics of a hematoma.  Summary: No evidence of pseudoaneurysm, AVF or DVT  Diagnosing physician: Curt Jews MD Electronically signed by Curt Jews MD on 10/04/2020 at 7:22:19 PM.   --------------------------------------------------------------------------------    Final    Disposition   Pt is being discharged home today in good condition.  Follow-up Plans & Appointments     Follow-up Information    Freada Bergeron, MD Follow up in 1 week(s).   Specialties: Cardiology, Radiology Why: office will call with appt Contact information: [address] [place] [address] [phone]              Discharge Instructions    Amb Referral to Cardiac Rehabilitation   Complete by: As directed    Diagnosis:  Coronary Stents PTCA     After initial evaluation and assessments completed: Virtual Based Care may be provided alone or in  conjunction with Phase 2 Cardiac Rehab based on patient barriers.: Yes   Diet - low sodium heart healthy   Complete by: As directed    Discharge instructions   Complete by: As directed    No driving for 1 week. No lifting over 5 lbs for 1 week. No sexual activity for 1 week. Keep procedure site clean & dry. If you notice increased pain, swelling, bleeding or pus, call/return!  You may shower, but no soaking baths/hot tubs/pools for 1 week.   Increase activity slowly   Complete by: As directed    No wound care   Complete by: As directed       Discharge Medications   Allergies as of 10/05/2020   No Known Allergies     Medication List  STOP taking these medications   isosorbide mononitrate 60 MG 24 hr tablet Commonly known as: IMDUR   losartan 25 MG tablet Commonly known as: COZAAR     TAKE these medications   acetaminophen 325 MG tablet Commonly known as: TYLENOL Take 325-650 mg by mouth every 6 (six) hours as needed for mild pain (or headaches).   aspirin 81 MG EC tablet Take 1 tablet (81 mg total) by mouth daily. Swallow whole. What changed: when to take this   atorvastatin 80 MG tablet Commonly known as: LIPITOR Take 1 tablet (80 mg total) by mouth daily. What changed: when to take this   Ferocon capsule Generic drug: ferrous [id number] C-folic acid Take 1 capsule by mouth 2 (two) times daily.   ferrous [id number] C-folic acid capsule Commonly known as: TRINSICON / FOLTRIN Take 1 capsule by mouth 2 (two) times daily after a meal.   metoprolol succinate 25 MG 24 hr tablet Commonly known as: TOPROL-XL Take 0.5 tablets (12.5 mg total) by mouth daily.   nitroGLYCERIN 0.4 MG SL tablet Commonly known as: NITROSTAT Place 1 tablet (0.4 mg total) under the tongue every 5 (five) minutes x 3 doses as needed for chest pain.   ticagrelor 90 MG Tabs tablet Commonly known as: BRILINTA Take 1 tablet (90 mg total) by mouth 2 (two) times daily.           Outstanding Labs/Studies   CBC  Groin [place]  Duration of Discharge Encounter   Greater than 30 minutes including physician time.  Signed, Tami Lin Duke, PA 10/05/2020, 12:13 PM

## 2020-10-05 NOTE — Progress Notes (Signed)
CARDIAC REHAB PHASE I   PRE:  Rate/Rhythm: 80 SR    BP: sitting 106/58    SaO2: 100 RA  MODE:  Ambulation: 240 ft   POST:  Rate/Rhythm: 106 ST    BP: sitting 122/57     SaO2: 99 RA  Second walk. Tolerated well. C/o soreness in groin but no CP/SOB. VSS. Discussed stent, Brilinta, restrictions, exercise, NTG, and CRPII. Pt voiced understanding. No diet questions. She was starting CRPII Monday. Will refer again and update [place] program. 1000-1040  Harriet Masson CES, ACSM 10/05/2020 11:05 AM

## 2020-10-05 NOTE — Progress Notes (Signed)
Progress Note  Patient Name: Brenda Wilkins Date of Encounter: 10/05/2020  [facility] Cardiologist: Freada Bergeron, MD   Subjective   No CP or dyspnea; back pain improved  Inpatient Medications    Scheduled Meds: . aspirin  81 mg Oral Daily  . atorvastatin  80 mg Oral Daily  . metoprolol succinate  12.5 mg Oral Daily  . sodium chloride flush  3 mL Intravenous Q12H  . ticagrelor  90 mg Oral BID   Continuous Infusions: . sodium chloride     PRN Meds: sodium chloride, acetaminophen, HYDROcodone-acetaminophen, morphine injection, nitroGLYCERIN, ondansetron (ZOFRAN) IV, sodium chloride flush   Vital Signs    Vitals:   10/04/20 2358 10/05/20 0100 10/05/20 0430 10/05/20 0525  BP:  (!) 111/49 104/62 (!) 125/56  Pulse:  87 73 72  Resp: $Remo'20 20 19 14  'TguHt$ Temp:  98.9 F (37.2 C) 98.7 F (37.1 C) 98.8 F (37.1 C)  TempSrc:  Oral Oral Oral  SpO2:  96% 95% 92%  Weight:    67.3 kg  Height:        Intake/Output Summary (Last 24 hours) at 10/05/2020 0748 Last data filed at 10/04/2020 2213 Gross per 24 hour  Intake 120 ml  Output 2650 ml  Net -2530 ml   Last 3 Weights 10/05/2020 10/02/2020 10/02/2020  Weight (lbs) 148 lb 5.9 oz 141 lb 1.6 oz 142 lb 12.7 oz  Weight (kg) 67.3 kg 64.003 kg 64.77 kg      Telemetry    Sinus - Personally Reviewed   Physical Exam   GEN: WD WN NAD Neck: supple Cardiac: RRR, no murmur Respiratory: CTA; no wheeze GI: Soft, NT/ND, no masses MS: No edema; right groin with hematoma. No bruit Neuro:  No focal findings Psych: Normal affect   Labs    High Sensitivity Troponin:   Recent Labs  Lab 10/02/20 1609 10/02/20 1841  TROPONINIHS 16 14      Chemistry Recent Labs  Lab 10/03/20 0212 10/04/20 0013 10/05/20 0236  NA 142 141 139  K 4.1 3.9 3.8  CL 110 112* 109  CO2 20* 19* 22  GLUCOSE 96 101* 100*  BUN $Re'14 11 12  'tmK$ CREATININE 0.84 0.87 0.95  CALCIUM 8.6* 8.1* 8.1*  GFRNONAA >60 >60 >60  ANIONGAP $RemoveB'12 10 8      'vvnpNMPv$ Hematology Recent Labs  Lab 10/04/20 0542 10/04/20 1302 10/05/20 0236  WBC 9.9 8.7 8.4  RBC 4.01 3.83* 3.51*  HGB 11.3* 11.0* 10.3*  HCT 37.3 35.5* 32.4*  MCV 93.0 92.7 92.3  MCH 28.2 28.7 29.3  MCHC 30.3 31.0 31.8  RDW 13.6 13.7 13.9  PLT 221 199 203    Radiology    CT ABDOMEN PELVIS W WO CONTRAST  Result Date: 10/04/2020 CLINICAL DATA:  Acute, nonlocalized abdominal pain, status post cardiac catheterization, right hip pain and swelling EXAM: CT ABDOMEN AND PELVIS WITHOUT AND WITH CONTRAST TECHNIQUE: Multidetector CT imaging of the abdomen and pelvis was performed following the standard protocol before and following the bolus administration of intravenous contrast. CONTRAST:  144mL OMNIPAQUE IOHEXOL 300 MG/ML  SOLN COMPARISON:  None. FINDINGS: Lower chest: The visualized lung bases are clear. The visualized heart and pericardium are unremarkable. Hepatobiliary: 2 small subcentimeter cysts are seen within the right hepatic lobe. The liver is otherwise unremarkable. Probable vicarious excretion of contrast within the gallbladder. The gallbladder is otherwise unremarkable. No intra or extrahepatic biliary ductal dilation. Pancreas: Unremarkable Spleen: Unremarkable Adrenals/Urinary Tract: The adrenal glands are unremarkable. 5.7 cm  simple exophytic cortical cyst arises from the interpolar region of the right kidney. The kidneys are otherwise unremarkable. The bladder is distended, but is otherwise unremarkable. Stomach/Bowel: Mild descending colonic diverticulosis. There is mild focal inflammatory change surrounding a single diverticulum involving the mid descending colon, best seen on axial image # 37 and sagittal image # 160 most in keeping with very mild acute or subacute descending colonic diverticulitis. There is background moderate descending colonic diverticulosis. The stomach, small bowel, and large bowel are otherwise unremarkable. No evidence of obstruction. No free intraperitoneal  gas or fluid. Appendix normal. Vascular/Lymphatic: Moderate aortoiliac atherosclerotic calcification is present. No aortic aneurysm. Particularly prominent atherosclerotic calcification is seen at the origin of the left renal artery and left common iliac artery. There is extensive infiltrative high attenuation material within the right groin in anterior right thigh compatible with subcutaneous hemorrhage. There is, additionally, and enhancing 6 mm nodule within the subcutaneous tissues anterolateral to the right common femoral artery compatible with a small pseudoaneurysm. Infiltrative hemorrhage does not extend into the retroperitoneum or peritoneal cavity. No pathologic adenopathy within the abdomen and pelvis. Reproductive: Uterus and bilateral adnexa are unremarkable. Other: Tiny fat containing umbilical hernia.  Rectum unremarkable. Musculoskeletal: Degenerative changes are seen within the lumbar spine. No acute bone abnormality. No lytic or blastic bone lesions are identified. IMPRESSION: Extensive interstitial high attenuation material within the anterior right thigh and right groin compatible with subcutaneous hemorrhage. Superimposed patent 6 mm pseudoaneurysm anterolateral to the right common femoral artery. No active extravasation identified. Very mild acute or subacute uncomplicated descending colonic diverticulitis. Peripheral vascular disease with prominent atherosclerotic calcification at the origin of the left renal and left common iliac arteries. The degree of stenosis is not well assessed on this non arteriographic study. These results will be called to the ordering clinician or representative by the Radiologist Assistant, and communication documented in the PACS or [employer]. Aortic Atherosclerosis (ICD10-I70.0). Electronically Signed   By: Fidela Salisbury MD   On: 10/04/2020 01:10   CARDIAC CATHETERIZATION  Result Date: 10/03/2020  Origin lesion is 100% stenosed.  Ost Cx to Prox Cx  lesion is 80% stenosed.  Ost LAD to Mid LAD lesion is 100% stenosed.  Origin to Prox Graft lesion before 1st Diag is 95% stenosed.  Origin to Prox Graft lesion between 1st Diag and 3rd Mrg is 100% stenosed.  A drug-eluting stent was successfully placed using a STENT RESOLUTE ONYX 4.5X22.  Post intervention, there is a 10% residual stenosis.  The left ventricular ejection fraction is 50-55% by visual estimate.  The left ventricular systolic function is normal.  LV end diastolic pressure is normal.  Brenda Wilkins is a 75 y.o. female  [id number] LOCATION:  FACILITY: [facility] PHYSICIAN: Quay Burow, M.D. [birth year] DATE OF PROCEDURE:  10/03/2020 DATE OF DISCHARGE: CARDIAC CATHETERIZATION / PCI DES SVG --DIAG History obtained from chart review.75 y.o. female with past medical history of coronary artery disease status post coronary artery bypass and graft December 2021, hyperlipidemia admitted with chest pain.  Patient had emergent coronary artery bypass and graft December 2021 with saphenous vein graft to LAD, sequential saphenous vein graft to diagonal and obtuse marginal.  Had chest pain following procedure and catheterization January 3 showed 80% circumflex, occluded LAD followed by 80% stenosis, saphenous vein graft to LAD occluded and 40% right coronary artery.  Ejection fraction 40%.  Patient was treated medically and if persistent symptoms noted PCI of the LAD could be attempted.  Echocardiogram January 2022 showed normal  LV function, grade 1 diastolic dysfunction.  She has been having accelerated angina.  She was admitted for this.  Her enzymes were negative.  She was heparinized presents now for diagnostic coronary angiography. PROCEDURE DESCRIPTION: The patient was brought to the second floor [facility] cath lab in the postabsorptive state.  She was premedicated with IV Versed and fentanyl.  Her right groin was prepped and shaved in usual sterile fashion. Xylocaine 1% was used for local anesthesia. A  5 French sheath was inserted into the right common femoral artery using standard Seldinger technique.  5 French right left second Silastic catheters long 5 inch pigtail catheter used for selective coronary angiography, site of vein graft angiography, subselective left internal mammary artery angiography and left ventriculography.  Isovue-used for the entirety of the case.  Retroaortic, ventricular and pullback pressures were recorded. Patient received 7 calciums of heparin with an ACT of 279.  ICU-used for the entirety of the intervention.  Retrograde aortic pressures monitored during the case.  Using a 6 [place] JR4 sidehole guide catheter along with a 0.14 [place] guidewire and a 2.5 mm x 15 mm long the ostium of the SVG to the diagonal branch was crossed fairly easily and predilated.  Following this a 4.5 mm x 22 mm long Medtronic resolute Onyx drug-eluting stent was then carefully positioned across the aorto ostium of the vein graft and deployed at 16 atm.  There was an obvious "dog bone" area that was resistant to complete stent deployment.  I then postdilated this area with a 4.5 mm x 10 mm long noncompliant balloon to 16 atm followed by a 4 mm x 8 mm noncompliant balloon to 16 atm (4.1 mm) resulting reduction of a 95% aorto ostial stenosis to less than 20% residual.  Most concerned that this may have been the area of a stitch and did not want to potentially cause a perforation.  There was excellent distal flow. Following this I performed DFR of the origin of the circumflex using a 6 French XB 3.5 cm guide catheter along with an Abbott DFR wire which was 0.99, 0.97 suggesting that the proximal circumflex was not physiologically significant.   Successful PCI drug-eluting stenting of the origin of the diagonal branch SVG with occlusion of the continuation of the diagonal to the obtuse marginal branch.  The DFR of the proximal native circumflex was 0.99 suggesting this was not physiologically significant.   There was dampening at the origin of the dominant RCA which had been demonstrated in several prior cardiac cath but no obstructive disease was noted.  The sheath was secured in place.  The patient was already on aspirin and ticagrelor.  The sheath will be removed once ACT falls below 170 pressure held.  Patient will be hydrated overnight, discharged home in the morning and follow-up with Dr. Johney Frame.` Quay Burow. MD, [facility] 10/03/2020 4:08 PM   VAS [place] GROIN PSEUDOANEURYSM  Result Date: 10/04/2020  ARTERIAL PSEUDOANEURYSM  Exam: Right groin Indications: Patient complains of groin pain and bruising. History: S/p catheterization. Performing Technologist: Abram Sander RVS  Examination Guidelines: A complete evaluation includes B-mode imaging, spectral Doppler, color Doppler, and power Doppler as needed of all accessible portions of each vessel. Bilateral testing is considered an integral part of a complete examination. Limited examinations for reoccurring indications may be performed as noted. +------------+----------+---------+------+----------+ Right DuplexPSV (cm/s)Waveform PlaqueComment(s) +------------+----------+---------+------+----------+ CFA             94    triphasic                 +------------+----------+---------+------+----------+  Findings: A mixed echogenic structure measuring approximately 4.1 cm x 1.8 cm is visualized at the right groin with ultrasound characteristics of a hematoma.  Summary: No evidence of pseudoaneurysm, AVF or DVT  Diagnosing physician: Curt Jews MD Electronically signed by Curt Jews MD on 10/04/2020 at 7:22:19 PM.   --------------------------------------------------------------------------------    Final     Patient Profile     75 y.o. female with past medical history of coronary artery disease status post coronary artery bypass and graft December 2021, hyperlipidemia admitted with chest pain.  Patient had emergent coronary artery bypass and graft December  2021 with saphenous vein graft to LAD, sequential saphenous vein graft to diagonal and obtuse marginal.  Had chest pain following procedure and catheterization January 3 showed 80% circumflex, occluded LAD followed by 80% stenosis, saphenous vein graft to LAD occluded and 40% right coronary artery.  Ejection fraction 40%.  Patient was treated medically and if persistent symptoms noted PCI of the LAD could be attempted.  Echocardiogram January 2022 showed normal LV function, grade 1 diastolic dysfunction.  Assessment & Plan    1 coronary artery disease-status post PCI of saphenous vein graft to first diagonal.  Continue aspirin, Brilinta and statin.  Blood pressure has been low and losartan and isosorbide discontinued.  We will resume low-dose toprol 12.5 mg daily.  Also note ranolazine has been discontinued.    2 right groin hematoma-as outlined previously CT demonstrates right thigh and groin hematoma and small pseudoaneurysm.  Follow-up ultrasound showed no pseudoaneurysm (likely too small to detect by ultrasound).  Hemoglobin 10.3.  Will ambulate this morning.  If stable will plan to discharge later today.  We will schedule with APP in 1 week for groin check.  Would also repeat ultrasound at that time.  3 hyperlipidemia-continue statin.  Ambulate today and if stable discharge as outlined above.  Continue present medications.  Follow-up APP 1 week with ultrasound of right groin at that time.  Follow-up Dr. Johney Frame 3 months.  Greater than 30 minutes PA and physician time. D2  For questions or updates, please contact [place] Please consult [email or web address] for contact info under        Signed, Kirk Ruths, MD  10/05/2020, 7:48 AM

## 2020-10-06 ENCOUNTER — Other Ambulatory Visit: Payer: Self-pay | Admitting: Cardiology

## 2020-10-06 ENCOUNTER — Telehealth: Payer: Self-pay

## 2020-10-06 MED ORDER — COLCHICINE 0.6 MG PO TABS: ORAL_TABLET | ORAL | 0 refills | Status: DC

## 2020-10-06 MED FILL — Nitroglycerin IV Soln 100 MCG/ML in D5W: INTRA_ARTERIAL | Qty: 10 | Status: AC

## 2020-10-06 NOTE — Telephone Encounter (Signed)
-----   Message from Meriam Sprague, MD sent at 10/06/2020  1:07 PM EST ----- Can we write her a script for colchicine for gout flare.  Directions are: Take 1.2mg  (2 pills) for one dose and then 0.6mg  (1 pills) 1 hour later.   It's just 3 pills. No refills. If she does not respond, we can do a round of steroids to help treat her symptoms.

## 2020-10-06 NOTE — Progress Notes (Signed)
Coding Query:  Initially seen by me with concern for unstable angina.  Patient was discharged by one of my partners after PCI and was diagnosed with NSTEMI.  At the time of my evaluation, unstable angina was the most appropriate diagnosis.  Christell Constant, MD

## 2020-10-06 NOTE — Telephone Encounter (Signed)
RN spoke to patient regarding prescription for colchicine for a gout flare up. Patient in agreement with plan. RN encouraged patient to contact the office if symptoms do not improve for further recommendations per Dr. Shari Prows. Patient verbalized understanding.

## 2020-10-07 ENCOUNTER — Encounter (HOSPITAL_COMMUNITY): Payer: Medicare HMO

## 2020-10-10 ENCOUNTER — Encounter (HOSPITAL_COMMUNITY): Payer: Medicare HMO

## 2020-10-12 ENCOUNTER — Encounter (HOSPITAL_COMMUNITY): Payer: Medicare HMO

## 2020-10-14 ENCOUNTER — Encounter (HOSPITAL_COMMUNITY): Payer: Medicare HMO

## 2020-10-17 ENCOUNTER — Encounter (HOSPITAL_COMMUNITY): Payer: Medicare HMO

## 2020-10-17 ENCOUNTER — Telehealth: Payer: Self-pay | Admitting: Cardiology

## 2020-10-17 DIAGNOSIS — R1909 Other intra-abdominal and pelvic swelling, mass and lump: Secondary | ICD-10-CM

## 2020-10-17 NOTE — Telephone Encounter (Signed)
Spoke with Dr. Shari Prows who recommends right groin ultrasound and CBC prior to her appointment with Dr. Shari Prows this week.    She has been scheduled for tomorrow.  She is aware that if there are any other changes to the area that she should call back asap.

## 2020-10-17 NOTE — Telephone Encounter (Signed)
2 areas at cath site   One is approx 1 inch, this has gone down considerably overnight, the second area is smaller. They were both more raised than now before she went to bed.  She laid flat all night.     There is no pain or tenderness, they are firm to touch.  No drainage or redness noted.  Feels good otherwise.    Did not do any heavy lifting or unusual activities.   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  She is aware I will review with Dr. Shari Prows and then we will give her a call back.

## 2020-10-17 NOTE — Telephone Encounter (Signed)
Pt c/o swelling: STAT is pt has developed SOB within 24 hours  1) How much weight have you gained and in what time span? None   2) If swelling, where is the swelling located? Right groan   3) Are you currently taking a fluid pill? Not sure   4) Are you currently SOB? No   5) Do you have a log of your daily weights (if so, list)? No   6) Have you gained 3 pounds in a day or 5 pounds in a week? No  7) Have you traveled recently?  No   Patient also sent a message threw Mychart as well last night.

## 2020-10-17 NOTE — Progress Notes (Deleted)
Cardiology Office Note:    Date:  10/17/2020   ID:  Brenda Wilkins, DOB [birth year], MRN [id number]  PCP:  Patient, No Pcp Per   [facility]  Cardiologist:  Meriam Sprague, MD *** Advanced Practice Provider:  No care team member to display Electrophysiologist:  None   Referring MD: No ref. provider found    History of Present Illness:    Brenda Wilkins is a 75 y.o. female with a hx of known CAD s/p CABG with recent NSTEMI and HLD who presents to clinic for follow-up from her recent hospitalization.  The patient suffered from an NSTEMI in early December found to have multivessel CAD on cath 07/17/20 s/p 3V CABG on 07/18/20(SVG to LAD, sequential SVG to Diag and OM).    Post-operative course was complicated by recurrent chest pain causing her to re-present to the ED on 08/07/20. In the ED, she was found to have elevated troponin consistent with NSTEMI. She underwent repeat cath, which revealed occluded SVG-LAD graft but TIMI 3 flow down LAD from SVG-Diag graft. Case was discussed with CV surgery and given brisk TIMI 3 flow down the LAD from the other bypass graft, the decision was made to manage medically.   She was doing well until 09/29/20 where she developed progressive chest pressure. She presented to [facility] on 10/02/20 and underwent coronary angiography where she had 95% stenosis of the graft before the 1st diag and 100% stenosis of the origin of the graft between the first Diag and OM2. A DES was placed at the origin of the diagonal branch SVG. Her post-cath course was complicated by a right groin hematoma and pseudoaneurysm. She was managed conservatively and now returns for follow-up.  Past Medical History:  Diagnosis Date  . Coronary artery disease   . Hyperlipidemia   . Hypertension   . NSTEMI (non-ST elevated myocardial infarction) (HCC) 07/16/2020  . 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 CARDIAC  CATHETERIZATION  08/08/2020  . CORONARY ARTERY BYPASS GRAFT N/A 07/18/2020   Procedure: CORONARY ARTERY BYPASS GRAFTING (CABG), ON PUMP, TIMES THREE, USING ENDOSCOPICALLY HARVESTED RIGHT GREATER SAPHENOUS VEIN;  Surgeon: Alleen Borne, MD;  Location: [facility] OR;  Service: Open Heart Surgery;  Laterality: N/A;  . CORONARY STENT INTERVENTION N/A 10/03/2020   Procedure: CORONARY STENT INTERVENTION;  Surgeon: Runell Gess, MD;  Location: [facility] INVASIVE CV LAB;  Service: Cardiovascular;  Laterality: N/A;  . CORONARY/GRAFT ACUTE MI REVASCULARIZATION N/A 07/18/2020   Procedure: Coronary/Graft Acute MI Revascularization;  Surgeon: [place]az[place], Peter M, MD;  Location: [facility] INVASIVE CV LAB;  Service: Cardiovascular;  Laterality: N/A;  . INTRAVASCULAR PRESSURE WIRE/FFR STUDY N/A 10/03/2020   Procedure: INTRAVASCULAR PRESSURE WIRE/FFR STUDY;  Surgeon: Runell Gess, MD;  Location: [facility] INVASIVE CV LAB;  Service: Cardiovascular;  Laterality: N/A;  . LEFT HEART CATH AND CORONARY ANGIOGRAPHY N/A 07/18/2020   Procedure: LEFT HEART CATH AND CORONARY ANGIOGRAPHY;  Surgeon: Tonny Bollman, MD;  Location: [facility] INVASIVE CV LAB;  Service: Cardiovascular;  Laterality: N/A;  . LEFT HEART CATH AND CORS/GRAFTS ANGIOGRAPHY N/A 08/08/2020   Procedure: LEFT HEART CATH AND CORS/GRAFTS ANGIOGRAPHY;  Surgeon: Lennette Bihari, MD;  Location: [facility] INVASIVE CV LAB;  Service: Cardiovascular;  Laterality: N/A;  . LEFT HEART CATH AND CORS/GRAFTS ANGIOGRAPHY N/A 10/03/2020   Procedure: LEFT HEART CATH AND CORS/GRAFTS ANGIOGRAPHY;  Surgeon: Runell Gess, MD;  Location: [facility] INVASIVE CV LAB;  Service: Cardiovascular;  Laterality:  N/A;  . TEE WITHOUT CARDIOVERSION N/A 07/18/2020   Procedure: TRANSESOPHAGEAL ECHOCARDIOGRAM (TEE);  Surgeon: Alleen Borne, MD;  Location: [facility] OR;  Service: Open Heart Surgery;  Laterality: N/A;    Current Medications: No outpatient medications have been marked as taking for the 10/20/20 encounter (Appointment) with  Meriam Sprague, MD.     Allergies:   Patient has no known allergies.   Social History   Socioeconomic History  . Marital status: Unknown    Spouse name: Not on file  . Number of children: Not on file  . Years of education: 44  . Highest education level: Not on file  Occupational History  . Not on file  Tobacco Use  . Smoking status: Never Smoker  . Smokeless tobacco: Never Used  Vaping Use  . Vaping Use: Never used  Substance and Sexual Activity  . Alcohol use: Never  . Drug use: Never  . Sexual activity: Not Currently  Other Topics Concern  . Not on file  Social History Narrative  . Not on file   Social Determinants of Health   Financial Resource Strain: Not on file  Food Insecurity: Not on file  Transportation Needs: Not on file  Physical Activity: Not on file  Stress: Not on file  Social Connections: Not on file     Family History: The patient's ***family history is not on file.  ROS:   Please see the history of present illness.    *** All other systems reviewed and are negative.  EKGs/Labs/Other Studies Reviewed:    The following studies were reviewed today: [place] PSA 10/04/20: A mixed echogenic structure measuring approximately 4.1 cm x 1.8 cm is  visualized at the right groin with ultrasound characteristics of a  hematoma.   ____________________  Left heart cath 10/03/20:  Origin lesion is 100% stenosed.  Ost Cx to Prox Cx lesion is 80% stenosed.  Ost LAD to Mid LAD lesion is 100% stenosed.  Origin to Prox Graft lesion before 1st Diag is 95% stenosed.  Origin to Prox Graft lesion between 1st Diag and 3rd Mrg is 100% stenosed.  A drug-eluting stent was successfully placed using a STENT RESOLUTE ONYX 4.5X22.  Post intervention, there is a 10% residual stenosis.  The left ventricular ejection fraction is 50-55% by visual estimate.  The left ventricular systolic function is normal.  LV end diastolic pressure is  normal.  IMPRESSION:Successful PCI drug-eluting stenting of the origin of the diagonal branch SVG with occlusion of the continuation of the diagonal to the obtuse marginal branch. The DFR of the proximal native circumflex was 0.99 suggesting this was not physiologically significant. There was dampening at the origin of the dominant RCA which had been demonstrated in several prior cardiac cath but no obstructive disease was noted. The sheath was secured in place. The patient was already on aspirin and ticagrelor. The sheath will be removed once ACT falls below 170 pressure held. Patient will be hydrated overnight, discharged home in the morning and follow-up with Dr. Shari Prows.` _____________   Echo 08/08/20: 1. Septal hypokinesis consistent with post-operative state. Left  ventricular ejection fraction, by estimation, is 50 to 55%. The left  ventricle has low normal function. The left ventricle demonstrates  regional wall motion abnormalities (see scoring  diagram/findings for description). Left ventricular diastolic parameters  are consistent with Grade I diastolic dysfunction (impaired relaxation).  2. Right ventricular systolic function is normal. The right ventricular  size is normal.  3. The mitral valve is  normal in structure. Trivial mitral valve  regurgitation. No evidence of mitral stenosis.  4. The aortic valve is tricuspid. Aortic valve regurgitation is not  visualized. No aortic stenosis is present.  5. The inferior vena cava is normal in size with greater than 50%  respiratory variability, suggesting right atrial pressure of 3 mmHg.   Cath 08/09/19:  Prox Cx lesion is 80% stenosed.  Prox LAD to Mid LAD lesion is 100% stenosed.  Mid LAD lesion is 80% stenosed.  Origin to Prox Graft lesion is 100% stenosed.  Ost RCA lesion is 40% stenosed.  There is mild to moderate left ventricular systolic dysfunction.  LV end diastolic pressure is mildly elevated.  Mild LV  dysfunction with mild anterolateral hypocontractility, EF estimate at 40% with LVEDP at 20 mmHg.  Significant native CAD with total occlusion of the LAD immediately proximal to the takeoff of the diagonal vessel.  Normal bifurcating ramus intermediate vessel.  Large left circumflex coronary artery with 80% proximal stenosis and competitive filling of a large OM 3 vessel.  Smooth ostial narrowing of the RCA of approximately 30 to 40%. However with catheter engagement there was significant catheter dampening which improved following IC nitroglycerin administration.  Occluded SVG which had supplied the LAD.  Patent sequential SVG supplying the diagonal vessel which then fills the LAD with previously noted 80% stenosis on an angle in the LAD immediately after the diagonal takeoff, and sequential graft supplying a large OM 3 vessel of the left circumflex coronary artery.  RECOMMENDATION: The patient's non-ST segment elevation myocardial infarction most likely is due to acute occlusion of the vein graft which had supplied the LAD. This graft is 433 weeks old. I suspect this occlusion may have contributed by the fact that the LAD is supplied by the diagonal vessel which is grafted and there is TIMI-3 flow down the LAD system. There continues to be a high-grade 80+ percent stenosis on an angle in the LAD arising from the diagonal vessel which 3 weeks ago was not able to be entered with a wire during attempted acute angioplasty. Since the patient is pain-free and there is a high likelihood of thrombus in the vein graft which has occluded plan initial aggressive medical management. Will initiate DAPT with aspirin/Brilinta. Continue beta-blocker therapy, initiate nitrate therapy, and continue aggressive lipid-lowering therapy. If patient continues to have symptoms, consider addition of ranolazine and possible future intervention to the native LAD after an aggressive medical therapy trial.    Diagnostic Dominance: Right    Intervention   TTE 08/08/20: IMPRESSIONS  1. Septal hypokinesis consistent with post-operative state. Left  ventricular ejection fraction, by estimation, is 50 to 55%. The left  ventricle has low normal function. The left ventricle demonstrates  regional wall motion abnormalities (see scoring  diagram/findings for description). Left ventricular diastolic parameters  are consistent with Grade I diastolic dysfunction (impaired relaxation).  2. Right ventricular systolic function is normal. The right ventricular  size is normal.  3. The mitral valve is normal in structure. Trivial mitral valve  regurgitation. No evidence of mitral stenosis.  4. The aortic valve is tricuspid. Aortic valve regurgitation is not  visualized. No aortic stenosis is present.  5. The inferior vena cava is normal in size with greater than 50%  respiratory variability, suggesting right atrial pressure of 3 mmHg.   Cath 07/18/20:   1st Diag lesion is 90% stenosed.  Prox Cx lesion is 75% stenosed.  Prox LAD-1 lesion is 100% stenosed.  Prox LAD-2  lesion is 95% stenosed with 95% stenosed side branch in 1st Diag.  Post intervention, there is a 95% residual stenosis.  Post intervention, there is a 95% residual stenosis.  Post intervention, the side branch was reduced to 95% residual stenosis.  1. Acute occlusion of the proximal LAD at site of very complex bifurcation lesion. Successful restoration of antegrade flow with POBA into the first diagonal branch. Unable to cross the lesion in the LAD distal to the diagonal with a wire.   Plan: emergent CABG.   Diagnostic Dominance: Right    Intervention      TTE 07/17/20: IMPRESSIONS  1. Left ventricular ejection fraction, by estimation, is 55 to 60%. The  left ventricle has normal function. The left ventricle has no regional  wall motion abnormalities. There is moderate asymmetric left  ventricular  hypertrophy. Left ventricular  diastolic parameters are consistent with Grade I diastolic dysfunction  (impaired relaxation).  2. Right ventricular systolic function is normal. The right ventricular  size is normal.  3. The mitral valve is grossly normal. No evidence of mitral valve  regurgitation.  4. The aortic valve was not well visualized. Aortic valve regurgitation  is not visualized. No aortic stenosis is present.   Comparison(s): No prior Echocardiogram.   Conclusion(s)/Recommendation(s): Normal biventricular function without  evidence of hemodynamically significant valvular heart disease.   FINDINGS  Left Ventricle: Left ventricular ejection fraction, by estimation, is 55  to 60%. The left ventricle has normal function. The left ventricle has no  regional wall motion abnormalities. The left ventricular internal cavity  size was small. There is moderate  asymmetric left ventricular hypertrophy. Left ventricular diastolic  parameters are consistent with Grade I diastolic dysfunction (impaired  relaxation).   Right Ventricle: The right ventricular size is normal. No increase in  right ventricular wall thickness. Right ventricular systolic function is  normal.   Left Atrium: Left atrial size was normal in size.   Right Atrium: Right atrial size was normal in size.   Pericardium: There is no evidence of pericardial effusion.   Mitral Valve: The mitral valve is grossly normal. No evidence of mitral  valve regurgitation.   Tricuspid Valve: The tricuspid valve is grossly normal. Tricuspid valve  regurgitation is not demonstrated.   Aortic Valve: The aortic valve was not well visualized. Aortic valve  regurgitation is not visualized. No aortic stenosis is present.   Pulmonic Valve: The pulmonic valve was grossly normal. Pulmonic valve  regurgitation is not visualized.   Aorta: The aortic root and ascending aorta are structurally normal, with  no  evidence of dilitation.   IAS/Shunts: The atrial septum is grossly normal.   07/18/20: CABG Op note: Procedure:  1. EmergentMedian Sternotomy 2. Extracorporeal circulation 3. Coronary artery bypass grafting x 3   Saphenous veingraft to the LAD  Sequential SVG to diagonal and OM.   4. Endoscopic vein harvest from the rightleg    EKG:  EKG is *** ordered today.  The ekg ordered today demonstrates ***  Recent Labs: 07/16/2020: ALT 30 07/17/2020: TSH 2.699 07/19/2020: Magnesium 2.3 10/05/2020: BUN 12; Creatinine, Ser 0.95; Hemoglobin 10.3; Platelets 203; Potassium 3.8; Sodium 139  Recent Lipid Panel    Component Value Date/Time   CHOL 254 (H) 07/17/2020 1015   TRIG 153 (H) 07/17/2020 1015   HDL 40 (L) 07/17/2020 1015   CHOLHDL 6.4 07/17/2020 1015   VLDL 31 07/17/2020 1015   LDLCALC 183 (H) 07/17/2020 1015     Risk Assessment/Calculations:   {  Does this patient have ATRIAL FIBRILLATION?:[phone]}   Physical Exam:    VS:  There were no vitals taken for this visit.    Wt Readings from Last 3 Encounters:  10/05/20 148 lb 5.9 oz (67.3 kg)  09/27/20 142 lb 10.2 oz (64.7 kg)  08/30/20 143 lb 9.6 oz (65.1 kg)     GEN: *** Well nourished, well developed in no acute distress HEENT: Normal NECK: No JVD; No carotid bruits LYMPHATICS: No lymphadenopathy CARDIAC: ***RRR, no murmurs, rubs, gallops RESPIRATORY:  Clear to auscultation without rales, wheezing or rhonchi  ABDOMEN: Soft, non-tender, non-distended MUSCULOSKELETAL:  No edema; No deformity  SKIN: Warm and dry NEUROLOGIC:  Alert and oriented x 3 PSYCHIATRIC:  Normal affect   ASSESSMENT:    No diagnosis found. PLAN:    In order of problems listed above:  #NSTEMIsecondary to newly occluded SVG-to-LAD graft #Multivessel CAD s/p CABG on 07/18/20 withSVG to LAD, sequential SVG to Diag and OM Patient with initial admission forNSTEMI found to have multivessel CAD on cath on 12/12/21prompting  emergent CABG on 07/18/20 as detailed above. LIMA deemed not a suitable bypass conduit due to small size. TTE on 07/17/20 with preserved LVEF 55-60%, no WMA, normal RV function. Post-operatively, she required inotropic support initially but was weaned off and was able to go home on 07/25/20. She was doing well until01/02when she developed acute onset back pain radiating to her chest. She presented to the ED where trop up-trended from 30-->500 and ECG with new TWI in the lateral leads (chronic q waves in V1-V3). Underwent coronary angiography which revealed occluded SVG-LAD graft. Suspect occlusion may have contributed by the fact that the LAD is supplied by the diagonal vessel which is grafted and there is TIMI-3 flow down the LAD system.Given concern for significant thrombus in the SVG-LAD graft and TIMI 3 flow down the LAD from SVG-Diag graft, the decision was made to pursue medical management. She was doing well until 09/29/20 when she developed progressive angina that required readmission on 10/02/20. Underwent coronary angiography on 10/03/20 where the SVG-D1 graft was 95% stenosed proximally and the jump graft to the OM3 was 100% occluded. She ultimately underwent PCI to the SVG-D1. Now presenting for follow-up.  -Continue aggressive medical management with ASA, brilinta for at least 12 months -Continue imdur 30mg  daily -Continue atorvastatin 80mg  daily -Continue metop 25mg  XL daily  #Right groin hematoma Recent cath procedure complicated by right groin hematoma. Pressure was held. CT demonstrated right thigh and groin hematoma and small pseudoaneurysm.  -Re-check ultrasound  #HLD: -Continue atorvastatin 80mg  daily   {Are you ordering a CV Procedure (e.g. stress test, cath, DCCV, TEE, etc)?   Press F2        :    Medication Adjustments/Labs and Tests Ordered: Current medicines are reviewed at length with the patient today.  Concerns regarding medicines are outlined above.  No  orders of the defined types were placed in this encounter.  No orders of the defined types were placed in this encounter.   There are no Patient Instructions on file for this visit.   Signed, , MD  10/17/2020 9:21 PM    [facility]

## 2020-10-18 ENCOUNTER — Other Ambulatory Visit: Payer: Self-pay

## 2020-10-18 ENCOUNTER — Ambulatory Visit (HOSPITAL_COMMUNITY)
Admission: RE | Admit: 2020-10-18 | Discharge: 2020-10-18 | Disposition: A | Discharge status: Home / Self Care | Payer: Medicare HMO | Source: Ambulatory Visit | Attending: Cardiovascular Disease | Admitting: Cardiovascular Disease

## 2020-10-18 ENCOUNTER — Other Ambulatory Visit: Payer: Self-pay | Admitting: *Deleted

## 2020-10-18 DIAGNOSIS — R1909 Other intra-abdominal and pelvic swelling, mass and lump: Secondary | ICD-10-CM | POA: Diagnosis not present

## 2020-10-18 LAB — CBC
Hematocrit: 38.8 % (ref 34.0–46.6)
Hemoglobin: 12.6 g/dL (ref 11.1–15.9)
MCH: 29.3 pg (ref 26.6–33.0)
MCHC: 32.5 g/dL (ref 31.5–35.7)
MCV: 90 fL (ref 79–97)
Platelets: 355 10*3/uL (ref 150–450)
RBC: 4.3 x10E6/uL (ref 3.77–5.28)
RDW: 13.7 % (ref 11.7–15.4)
WBC: 7.8 10*3/uL (ref 3.4–10.8)

## 2020-10-18 NOTE — Progress Notes (Addendum)
Cardiology Office Note:    Date:  10/20/2020   ID:  Brenda Wilkins, DOB [birth year], MRN [id number]  PCP:  Patient, No Pcp Per   [facility]  Cardiologist:  Meriam Sprague, MD  Advanced Practice Provider:  No care team member to display Electrophysiologist:  None    Referring MD: No ref. provider found    History of Present Illness:    Brenda Wilkins is a 75 y.o. female with a hx of known CAD s/p CABG with recent NSTEMI and HLD who presents to clinic for follow-up from her recent hospitalization.  The patient suffered from anNSTEMI in early December found to have multivessel CAD on cath 07/17/20 s/p 3V CABG on 07/18/20(SVG to LAD, sequential SVG to Diag and OM).   Post-operative course was complicated by recurrent chest pain causing her tore-presentto theED on 08/07/20. In the ED, she was found tohave elevated troponin consistent with NSTEMI. She underwent repeat cath, whichrevealedoccluded SVG-LAD graft but TIMI 3 flow down LAD from SVG-Diag graft.Case was discussed with CV surgery and given brisk TIMI 3 flow down the LAD from the other bypass graft, the decision was made to manage medically.  She was doing well until 09/29/20 where she developed progressive chest pressure. She presented to [facility] on 10/02/20 and underwent coronary angiography where she had 95% stenosis of the graft before the 1st diag and 100% stenosis of the origin of the graft between the first Diag and OM2. A DES was placed at the origin of the diagonal branch SVG. Her post-cath course was complicated by a right groin hematoma and pseudoaneurysm. She was managed conservatively.  Called the office prior to her visit on 10/17/20 as she noted increased swelling in her groin with a palpable firm mass at prior groin access site. CBC was obtained which showed hemoglobin 12.6 which was improved from 10.3 on discharge. Ultrasound consistent with hematoma but no evidence of pseudoaneurysm. She  now presents for follow-up,.  Today, the patient states that she feels well. Her follow-up ultasound of her groin showed hematoma but no pseudoaneurysm. No chest pain, shortness of breath, nausea, vomiting, fevers, chills. Has been having some left wrist pain with radiation into her fingers for the past 2 days. No injury to that site or recent instrumentation. Feels different than her gout and pain is intermittent in nature. Otherwise, she is tolerating medications as prescribed. Has continued firm hematoma in left groin that is relatively unchanged in size. Has avoided heavy lifting or much exercise while awaiting the groin to heal. No numbness/tingling or pain in the leg.   Past Medical History:  Diagnosis Date  . Coronary artery disease   . Hyperlipidemia   . Hypertension   . NSTEMI (non-ST elevated myocardial infarction) (HCC) 07/16/2020  . 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 CARDIAC CATHETERIZATION  08/08/2020  . CORONARY ARTERY BYPASS GRAFT N/A 07/18/2020   Procedure: CORONARY ARTERY BYPASS GRAFTING (CABG), ON PUMP, TIMES THREE, USING ENDOSCOPICALLY HARVESTED RIGHT GREATER SAPHENOUS VEIN;  Surgeon: Alleen Borne, MD;  Location: [facility] OR;  Service: Open Heart Surgery;  Laterality: N/A;  . CORONARY STENT INTERVENTION N/A 10/03/2020   Procedure: CORONARY STENT INTERVENTION;  Surgeon: Runell Gess, MD;  Location: [facility] INVASIVE CV LAB;  Service: Cardiovascular;  Laterality: N/A;  . CORONARY/GRAFT ACUTE MI REVASCULARIZATION N/A 07/18/2020   Procedure: Coronary/Graft Acute MI Revascularization;  Surgeon: Swaziland, Peter M, MD;  Location: [facility] INVASIVE CV  LAB;  Service: Cardiovascular;  Laterality: N/A;  . INTRAVASCULAR PRESSURE WIRE/FFR STUDY N/A 10/03/2020   Procedure: INTRAVASCULAR PRESSURE WIRE/FFR STUDY;  Surgeon: Runell Gess, MD;  Location: [facility] INVASIVE CV LAB;  Service: Cardiovascular;  Laterality: N/A;  . LEFT HEART CATH AND CORONARY  ANGIOGRAPHY N/A 07/18/2020   Procedure: LEFT HEART CATH AND CORONARY ANGIOGRAPHY;  Surgeon: Tonny Bollman, MD;  Location: [facility] INVASIVE CV LAB;  Service: Cardiovascular;  Laterality: N/A;  . LEFT HEART CATH AND CORS/GRAFTS ANGIOGRAPHY N/A 08/08/2020   Procedure: LEFT HEART CATH AND CORS/GRAFTS ANGIOGRAPHY;  Surgeon: Lennette Bihari, MD;  Location: [facility] INVASIVE CV LAB;  Service: Cardiovascular;  Laterality: N/A;  . LEFT HEART CATH AND CORS/GRAFTS ANGIOGRAPHY N/A 10/03/2020   Procedure: LEFT HEART CATH AND CORS/GRAFTS ANGIOGRAPHY;  Surgeon: Runell Gess, MD;  Location: [facility] INVASIVE CV LAB;  Service: Cardiovascular;  Laterality: N/A;  . TEE WITHOUT CARDIOVERSION N/A 07/18/2020   Procedure: TRANSESOPHAGEAL ECHOCARDIOGRAM (TEE);  Surgeon: Alleen Borne, MD;  Location: [facility] OR;  Service: Open Heart Surgery;  Laterality: N/A;    Current Medications: Current Meds  Medication Sig  . acetaminophen (TYLENOL) 325 MG tablet Take 325-650 mg by mouth every 6 (six) hours as needed for mild pain (or headaches).  Marland Kitchen aspirin 81 MG EC tablet Take 1 tablet (81 mg total) by mouth daily. Swallow whole.  Marland Kitchen atorvastatin (LIPITOR) 80 MG tablet Take 1 tablet (80 mg total) by mouth daily.  . colchicine 0.6 MG tablet Take 1.2mg  (2 tablets) for the first dose and the 0.6mg (1 tablet) one hour later.  . ferrous fumarate-b12-vitamic C-folic acid (FEROCON) capsule Take 1 capsule by mouth 2 (two) times daily.  . metoprolol succinate (TOPROL-XL) 25 MG 24 hr tablet Take 0.5 tablets (12.5 mg total) by mouth daily.  . nitroGLYCERIN (NITROSTAT) 0.4 MG SL tablet Place 1 tablet (0.4 mg total) under the tongue every 5 (five) minutes x 3 doses as needed for chest pain.  . ticagrelor (BRILINTA) 90 MG TABS tablet Take 1 tablet (90 mg total) by mouth 2 (two) times daily.     Allergies:   Patient has no known allergies.   Social History   Socioeconomic History  . Marital status: Unknown    Spouse name: Not on file  . Number of  children: Not on file  . Years of education: 3  . Highest education level: Not on file  Occupational History  . Not on file  Tobacco Use  . Smoking status: Never Smoker  . Smokeless tobacco: Never Used  Vaping Use  . Vaping Use: Never used  Substance and Sexual Activity  . Alcohol use: Never  . Drug use: Never  . Sexual activity: Not Currently  Other Topics Concern  . Not on file  Social History Narrative  . Not on file   Social Determinants of Health   Financial Resource Strain: Not on file  Food Insecurity: Not on file  Transportation Needs: Not on file  Physical Activity: Not on file  Stress: Not on file  Social Connections: Not on file     Family History: The patient's family history is not on file.  ROS:   Please see the history of present illness.    Review of Systems  Constitutional: Negative for chills and fever.  HENT: Negative for hearing loss.   Eyes: Negative for blurred vision and redness.  Respiratory: Negative for shortness of breath.   Cardiovascular: Negative for chest pain, palpitations, orthopnea, claudication, leg swelling and PND.  Gastrointestinal: Negative for melena, nausea and vomiting.  Genitourinary: Negative for dysuria and flank pain.  Musculoskeletal: Positive for joint pain.  Neurological: Negative for dizziness and loss of consciousness.  Endo/Heme/Allergies: Negative for polydipsia.  Psychiatric/Behavioral: Negative for substance abuse.    EKGs/Labs/Other Studies Reviewed:    The following studies were reviewed today:  Ultrasound 10/18/20: Summary:  The avascular inhomogeneous structure noted in the right groin has  characteristics of a hematoma, measuring 6.7 cm in length and 4.4 cm in  width.   [place] PSA 10/04/20: A mixed echogenic structure measuring approximately 4.1 cm x 1.8 cm is  visualized at the right groin with ultrasound characteristics of a  hematoma.   ____________________  Left heart cath 10/03/20:  Origin  lesion is 100% stenosed.  Ost Cx to Prox Cx lesion is 80% stenosed.  Ost LAD to Mid LAD lesion is 100% stenosed.  Origin to Prox Graft lesion before 1st Diag is 95% stenosed.  Origin to Prox Graft lesion between 1st Diag and 3rd Mrg is 100% stenosed.  A drug-eluting stent was successfully placed using a STENT RESOLUTE ONYX 4.5X22.  Post intervention, there is a 10% residual stenosis.  The left ventricular ejection fraction is 50-55% by visual estimate.  The left ventricular systolic function is normal.  LV end diastolic pressure is normal.  IMPRESSION:Successful PCI drug-eluting stenting of the origin of the diagonal branch SVG with occlusion of the continuation of the diagonal to the obtuse marginal branch. The DFR of the proximal native circumflex was 0.99 suggesting this was not physiologically significant. There was dampening at the origin of the dominant RCA which had been demonstrated in several prior cardiac cath but no obstructive disease was noted. The sheath was secured in place. The patient was already on aspirin and ticagrelor. The sheath will be removed once ACT falls below 170 pressure held. Patient will be hydrated overnight, discharged home in the morning and follow-up with Dr. Shari Prows.` _____________  Echo 08/08/20: 1. Septal hypokinesis consistent with post-operative state. Left  ventricular ejection fraction, by estimation, is 50 to 55%. The left  ventricle has low normal function. The left ventricle demonstrates  regional wall motion abnormalities (see scoring  diagram/findings for description). Left ventricular diastolic parameters  are consistent with Grade I diastolic dysfunction (impaired relaxation).  2. Right ventricular systolic function is normal. The right ventricular  size is normal.  3. The mitral valve is normal in structure. Trivial mitral valve  regurgitation. No evidence of mitral stenosis.  4. The aortic valve is tricuspid. Aortic  valve regurgitation is not  visualized. No aortic stenosis is present.  5. The inferior vena cava is normal in size with greater than 50%  respiratory variability, suggesting right atrial pressure of 3 mmHg.   Cath 08/09/19:  Prox Cx lesion is 80% stenosed.  Prox LAD to Mid LAD lesion is 100% stenosed.  Mid LAD lesion is 80% stenosed.  Origin to Prox Graft lesion is 100% stenosed.  Ost RCA lesion is 40% stenosed.  There is mild to moderate left ventricular systolic dysfunction.  LV end diastolic pressure is mildly elevated.  Mild LV dysfunction with mild anterolateral hypocontractility, EF estimate at 40% with LVEDP at 20 mmHg.  Significant native CAD with total occlusion of the LAD immediately proximal to the takeoff of the diagonal vessel.  Normal bifurcating ramus intermediate vessel.  Large left circumflex coronary artery with 80% proximal stenosis and competitive filling of a large OM 3 vessel.  Smooth ostial narrowing of the  RCA of approximately 30 to 40%. However with catheter engagement there was significant catheter dampening which improved following IC nitroglycerin administration.  Occluded SVG which had supplied the LAD.  Patent sequential SVG supplying the diagonal vessel which then fills the LAD with previously noted 80% stenosis on an angle in the LAD immediately after the diagonal takeoff, and sequential graft supplying a large OM 3 vessel of the left circumflex coronary artery.  RECOMMENDATION: The patient's non-ST segment elevation myocardial infarction most likely is due to acute occlusion of the vein graft which had supplied the LAD. This graft is 59 weeks old. I suspect this occlusion may have contributed by the fact that the LAD is supplied by the diagonal vessel which is grafted and there is TIMI-3 flow down the LAD system. There continues to be a high-grade 80+ percent stenosis on an angle in the LAD arising from the diagonal vessel which 3 weeks  ago was not able to be entered with a wire during attempted acute angioplasty. Since the patient is pain-free and there is a high likelihood of thrombus in the vein graft which has occluded plan initial aggressive medical management. Will initiate DAPT with aspirin/Brilinta. Continue beta-blocker therapy, initiate nitrate therapy, and continue aggressive lipid-lowering therapy. If patient continues to have symptoms, consider addition of ranolazine and possible future intervention to the native LAD after an aggressive medical therapy trial.   Diagnostic Dominance: Right    Intervention   TTE 08/08/20: IMPRESSIONS  1. Septal hypokinesis consistent with post-operative state. Left  ventricular ejection fraction, by estimation, is 50 to 55%. The left  ventricle has low normal function. The left ventricle demonstrates  regional wall motion abnormalities (see scoring  diagram/findings for description). Left ventricular diastolic parameters  are consistent with Grade I diastolic dysfunction (impaired relaxation).  2. Right ventricular systolic function is normal. The right ventricular  size is normal.  3. The mitral valve is normal in structure. Trivial mitral valve  regurgitation. No evidence of mitral stenosis.  4. The aortic valve is tricuspid. Aortic valve regurgitation is not  visualized. No aortic stenosis is present.  5. The inferior vena cava is normal in size with greater than 50%  respiratory variability, suggesting right atrial pressure of 3 mmHg.   Cath 07/18/20:   1st Diag lesion is 90% stenosed.  Prox Cx lesion is 75% stenosed.  Prox LAD-1 lesion is 100% stenosed.  Prox LAD-2 lesion is 95% stenosed with 95% stenosed side branch in 1st Diag.  Post intervention, there is a 95% residual stenosis.  Post intervention, there is a 95% residual stenosis.  Post intervention, the side branch was reduced to 95% residual stenosis.  1. Acute occlusion of the  proximal LAD at site of very complex bifurcation lesion. Successful restoration of antegrade flow with POBA into the first diagonal branch. Unable to cross the lesion in the LAD distal to the diagonal with a wire.   Plan: emergent CABG.   Diagnostic Dominance: Right    Intervention      TTE 07/17/20: IMPRESSIONS  1. Left ventricular ejection fraction, by estimation, is 55 to 60%. The  left ventricle has normal function. The left ventricle has no regional  wall motion abnormalities. There is moderate asymmetric left ventricular  hypertrophy. Left ventricular  diastolic parameters are consistent with Grade I diastolic dysfunction  (impaired relaxation).  2. Right ventricular systolic function is normal. The right ventricular  size is normal.  3. The mitral valve is grossly normal. No evidence of mitral  valve  regurgitation.  4. The aortic valve was not well visualized. Aortic valve regurgitation  is not visualized. No aortic stenosis is present.   Comparison(s): No prior Echocardiogram.   Conclusion(s)/Recommendation(s): Normal biventricular function without  evidence of hemodynamically significant valvular heart disease.   FINDINGS  Left Ventricle: Left ventricular ejection fraction, by estimation, is 55  to 60%. The left ventricle has normal function. The left ventricle has no  regional wall motion abnormalities. The left ventricular internal cavity  size was small. There is moderate  asymmetric left ventricular hypertrophy. Left ventricular diastolic  parameters are consistent with Grade I diastolic dysfunction (impaired  relaxation).   Right Ventricle: The right ventricular size is normal. No increase in  right ventricular wall thickness. Right ventricular systolic function is  normal.   Left Atrium: Left atrial size was normal in size.   Right Atrium: Right atrial size was normal in size.   Pericardium: There is no evidence of pericardial effusion.    Mitral Valve: The mitral valve is grossly normal. No evidence of mitral  valve regurgitation.   Tricuspid Valve: The tricuspid valve is grossly normal. Tricuspid valve  regurgitation is not demonstrated.   Aortic Valve: The aortic valve was not well visualized. Aortic valve  regurgitation is not visualized. No aortic stenosis is present.   Pulmonic Valve: The pulmonic valve was grossly normal. Pulmonic valve  regurgitation is not visualized.   Aorta: The aortic root and ascending aorta are structurally normal, with  no evidence of dilitation.   IAS/Shunts: The atrial septum is grossly normal.   07/18/20: CABG Op note: Procedure:  1. Emergent[employer] Sternotomy 2. Extracorporeal circulation 3. Coronary artery bypass grafting x 3   Saphenous veingraft to the LAD  Sequential SVG to diagonal and OM.   4. Endoscopic vein harvest from the rightleg   EKG today: NSR with anteroseptal q-waves  Recent Labs: 07/16/2020: ALT 30 07/17/2020: TSH 2.699 07/19/2020: Magnesium 2.3 10/05/2020: BUN 12; Creatinine, Ser 0.95; Potassium 3.8; Sodium 139 10/18/2020: Hemoglobin 12.6; Platelets 355  Recent Lipid Panel    Component Value Date/Time   CHOL 254 (H) 07/17/2020 1015   TRIG 153 (H) 07/17/2020 1015   HDL 40 (L) 07/17/2020 1015   CHOLHDL 6.4 07/17/2020 1015   VLDL 31 07/17/2020 1015   LDLCALC 183 (H) 07/17/2020 1015     Risk Assessment/Calculations:       Physical Exam:    VS:  BP 120/80   Pulse 73   Ht 4' 11.75" (1.518 m)   Wt 140 lb 6.4 oz (63.7 kg)   SpO2 98%   BMI 27.65 kg/m     Wt Readings from Last 3 Encounters:  10/20/20 140 lb 6.4 oz (63.7 kg)  10/05/20 148 lb 5.9 oz (67.3 kg)  09/27/20 142 lb 10.2 oz (64.7 kg)     GEN:  Well nourished, well developed in no acute distress HEENT: Normal NECK: No JVD; No carotid bruits CARDIAC: RRR, no murmurs, rubs, gallops. Sternotomy site c/d/i.  RESPIRATORY:  Clear to auscultation without rales, wheezing  or rhonchi  ABDOMEN: Soft, non-tender, non-distended MUSCULOSKELETAL:  Has firm, well-circumscribed hematoma measuring 6x6cm in the right groin. Some surrounding healing ecchymosis. Pulses intact.  SKIN: Warm and dry NEUROLOGIC:  Alert and oriented x 3 PSYCHIATRIC:  Normal affect   ASSESSMENT:    1. Coronary artery disease involving native coronary artery of native heart without angina pectoris   2. Left wrist pain   3. NSTEMI (non-ST elevated myocardial infarction) (HCC)  4. Hematoma   5. Pseudoaneurysm following procedure (HCC)    PLAN:    In order of problems listed above:  #NSTEMIsecondary to newly occluded SVG-to-LAD graft #Multivessel CAD s/p CABG on 07/18/20 withSVG to LAD, sequential SVG to Diag and OM Patient with initial admission forNSTEMI found to have multivessel CAD on cath on 12/12/21prompting emergent CABG on 07/18/20 as detailed above. LIMA deemed not a suitable bypass conduit due to small size. TTE on 07/17/20 with preserved LVEF 55-60%, no WMA, normal RV function. Post-operatively, she required inotropic support initially but was weaned off and was able to go home on 07/25/20. She was doing well until01/02when she developed acute onset back pain radiating to her chest. She presented to the ED where trop up-trended from 30-->500 and ECG with new TWI in the lateral leads (chronic q waves in V1-V3). Underwent coronary angiography which revealed occluded SVG-LAD graft. Suspect occlusion may have contributed by the fact that the LAD is supplied by the diagonal vessel which is grafted and there is TIMI-3 flow down the LAD system.Given concern for significant thrombus in the SVG-LAD graft and TIMI 3 flow down the LAD from SVG-Diag graft, the decision was made to pursue medical management. She was doing well until 09/29/20 when she developed progressive angina that required readmission on 10/02/20. Underwent coronary angiography on 10/03/20 where the SVG-D1 graft was 95%  stenosed proximally and the jump graft to the OM3 was 100% occluded. She ultimately underwent PCI to the SVG-D1. Now presenting for follow-up. -Continue aggressive medical management with ASA, brilintafor at least 12 months -Did not tolerate imdur due to hypotension and no active chest pain; can consider ranexa if angina returns -Continue atorvastatin  daily -Continue metop  XL daily -Hold cardiac rehab until Monday of next week to allow groin hematoma to heal  #Right groin hematoma Recent cath procedure complicated by right groin hematoma. Pressure was held. CT demonstrated right thigh and groin hematoma and small pseudoaneurysm. Repet ultrasound with residual hematoma but no pseudoaneurysm. CBC improved to 12. -No heavy lifting or vigorous exercise for 7 days; can resume cardiac rehab on 10/24/20  #HLD: -Continue atorvastatin  daily  #Left Wrist Pain: -Refer to Dr. Katrinka Blazing with sports medicine     Medication Adjustments/Labs and Tests Ordered: Current medicines are reviewed at length with the patient today.  Concerns regarding medicines are outlined above.  Orders Placed This Encounter  Procedures  . AMB referral to sports medicine  . EKG 12-Lead   No orders of the defined types were placed in this encounter.   Patient Instructions  Medication Instructions:  Your physician recommends that you continue on your current medications as directed. Please refer to the Current Medication list given to you today. *If you need a refill on your cardiac medications before your next appointment, please call your pharmacy*   Lab Work: None today If you have labs (blood work) drawn today and your tests are completely normal, you will receive your results only by: Marland Kitchen MyChart Message (if you have MyChart) OR . A paper copy in the mail If you have any lab test that is abnormal or we need to change your treatment, we will call you to review the results.  You have been referred to  Dr. Terrilee Files for L wrist pain   Follow-Up: At [facility], you and your health needs are our priority.  As part of our continuing mission to provide you with exceptional heart care, we have created designated Provider Care Teams.  These Care Teams include your primary Cardiologist (physician) and Advanced Practice Providers (APPs -  Physician Assistants and Nurse Practitioners) who all work together to provide you with the care you need, when you need it.  We recommend signing up for the patient portal called "MyChart".  Sign up information is provided on this After Visit Summary.  MyChart is used to connect with patients for Virtual Visits (Telemedicine).  Patients are able to view lab/test results, encounter notes, upcoming appointments, etc.  Non-urgent messages can be sent to your provider as well.   To learn more about what you can do with MyChart, go to [email or web address].    Your next appointment:   3 week(s)  The format for your next appointment:   In Person  Provider:   You may see Meriam SpragueHeather E Donaven Criswell, MD or one of the following Advanced Practice Providers on your designated Care Team:    Tereso NewcomerScott Weaver, PA-C  Vin TampicoBhagat, [place] JerseyPA-C    Other Instructions Hematoma precautions: Please call if hematoma, swollen area worsen ; if you start to have chest pain with exertion please call Dr. Shari ProwsPemberton or EMS     Signed, Meriam SpragueHeather E Ainslie Mazurek, MD  10/20/2020 1:07 PM    [facility]

## 2020-10-19 ENCOUNTER — Encounter (HOSPITAL_COMMUNITY): Payer: Medicare HMO

## 2020-10-20 ENCOUNTER — Ambulatory Visit: Payer: Medicare HMO | Admitting: Cardiology

## 2020-10-20 ENCOUNTER — Encounter: Payer: Self-pay | Admitting: Cardiology

## 2020-10-20 ENCOUNTER — Telehealth (HOSPITAL_COMMUNITY): Payer: Self-pay | Admitting: *Deleted

## 2020-10-20 ENCOUNTER — Other Ambulatory Visit: Payer: Self-pay

## 2020-10-20 VITALS — BP 120/80 | HR 73 | Ht 59.75 in | Wt 140.4 lb

## 2020-10-20 DIAGNOSIS — I729 Aneurysm of unspecified site: Secondary | ICD-10-CM | POA: Diagnosis not present

## 2020-10-20 DIAGNOSIS — M25532 Pain in left wrist: Secondary | ICD-10-CM | POA: Diagnosis not present

## 2020-10-20 DIAGNOSIS — T148XXA Other injury of unspecified body region, initial encounter: Secondary | ICD-10-CM | POA: Diagnosis not present

## 2020-10-20 DIAGNOSIS — T81718A Complication of other artery following a procedure, not elsewhere classified, initial encounter: Secondary | ICD-10-CM | POA: Diagnosis not present

## 2020-10-20 DIAGNOSIS — I214 Non-ST elevation (NSTEMI) myocardial infarction: Secondary | ICD-10-CM | POA: Diagnosis not present

## 2020-10-20 DIAGNOSIS — I2510 Atherosclerotic heart disease of native coronary artery without angina pectoris: Secondary | ICD-10-CM

## 2020-10-20 NOTE — Telephone Encounter (Signed)
-----   Message from Loa Socks, LPN sent at 1/93/7902 11:00 AM EDT ----- Regarding: pt will start cardiac rehab next monday per Dr. Encarnacion Chu there hope all is well.  Dr. Shari Prows wanted me to let you know that the pt will start back cardiac rehab with you on next Monday to allow her hematoma in her groin to heal.  She saw the pt in clinic today, so you can see her notes when she's finished. Just wanted to let you know.  Thanks, [employer]

## 2020-10-20 NOTE — Patient Instructions (Signed)
Medication Instructions:  Your physician recommends that you continue on your current medications as directed. Please refer to the Current Medication list given to you today. *If you need a refill on your cardiac medications before your next appointment, please call your pharmacy*   Lab Work: None today If you have labs (blood work) drawn today and your tests are completely normal, you will receive your results only by: Marland Kitchen MyChart Message (if you have MyChart) OR . A paper copy in the mail If you have any lab test that is abnormal or we need to change your treatment, we will call you to review the results.  You have been referred to Dr. Terrilee Files for L wrist pain   Follow-Up: At [facility], you and your health needs are our priority.  As part of our continuing mission to provide you with exceptional heart care, we have created designated Provider Care Teams.  These Care Teams include your primary Cardiologist (physician) and Advanced Practice Providers (APPs -  Physician Assistants and Nurse Practitioners) who all work together to provide you with the care you need, when you need it.  We recommend signing up for the patient portal called "MyChart".  Sign up information is provided on this After Visit Summary.  MyChart is used to connect with patients for Virtual Visits (Telemedicine).  Patients are able to view lab/test results, encounter notes, upcoming appointments, etc.  Non-urgent messages can be sent to your provider as well.   To learn more about what you can do with MyChart, go to [email or web address].    Your next appointment:   3 week(s)  The format for your next appointment:   In Person  Provider:   You may see Meriam Sprague, MD or one of the following Advanced Practice Providers on your designated Care Team:    Tereso Newcomer, PA-C  Vin Bayard, [place]    Other Instructions Hematoma precautions: Please call if hematoma, swollen area worsen ; if you start to  have chest pain with exertion please call Dr. Shari Prows or EMS

## 2020-10-21 ENCOUNTER — Encounter (HOSPITAL_COMMUNITY): Payer: Medicare HMO

## 2020-10-24 ENCOUNTER — Other Ambulatory Visit: Payer: Self-pay

## 2020-10-24 ENCOUNTER — Encounter (HOSPITAL_COMMUNITY)
Admission: RE | Admit: 2020-10-24 | Discharge: 2020-10-24 | Disposition: A | Discharge status: Home / Self Care | Payer: Medicare HMO | Source: Ambulatory Visit | Attending: Cardiology | Admitting: Cardiology

## 2020-10-24 DIAGNOSIS — Z955 Presence of coronary angioplasty implant and graft: Secondary | ICD-10-CM | POA: Diagnosis not present

## 2020-10-24 DIAGNOSIS — I214 Non-ST elevation (NSTEMI) myocardial infarction: Secondary | ICD-10-CM

## 2020-10-24 NOTE — Progress Notes (Signed)
Daily Session Note  Patient Details  Name: Brenda Wilkins MRN: [id number] Date of Birth: 17-Jun-1946 Referring Provider:   Flowsheet Row CARDIAC REHAB PHASE II ORIENTATION from 09/27/2020 in [facility]  Referring Provider Freada Bergeron, MD      Encounter Date: 10/24/2020  Check In:  Session Check In - 10/24/20 1158      Check-In   Supervising physician immediately available to respond to emergencies [facility] immediately available    Physician(s) Dr. Tawanna Solo    Location [facility]    Staff Present Seward Carol, MS, ACSM CEP, Exercise Physiologist;Maria Whitaker, RN, BSN;Other;David Onslow, MS, EP-C, CCRP;Carlette Carlton, RN, BSN    Virtual Visit No    Medication changes reported     No    Fall or balance concerns reported    No    Tobacco Cessation No Change    Warm-up and Cool-down Performed on first and last piece of equipment    Resistance Training Performed Yes    VAD Patient? No    PAD/SET Patient? No      Pain Assessment   Currently in Pain? No/denies    Pain Score 0-No pain    Multiple Pain Sites No           Capillary Blood Glucose: No results found for this or any previous visit (from the past 24 hour(s)).   Exercise Prescription Changes - 10/24/20 1047      Response to Exercise   Blood Pressure (Admit) 102/58    Blood Pressure (Exercise) 122/78    Blood Pressure (Exit) 102/72    Heart Rate (Admit) 93 bpm    Heart Rate (Exercise) 108 bpm    Heart Rate (Exit) 87 bpm    Rating of Perceived Exertion (Exercise) 11    Symptoms none    Comments Off to a good start with exercise.    Duration Continue with 30 min of aerobic exercise without signs/symptoms of physical distress.    Intensity THRR unchanged      Progression   Progression Continue to progress workloads to maintain intensity without signs/symptoms of physical distress.    Average METs 2      Resistance Training   Training  Prescription Yes    Weight 2 lbs    Reps 10-15    Time 10 Minutes      Interval Training   Interval Training No      NuStep   Level 1    SPM 85    Minutes 15    METs 1.6      Track   Laps 13    Minutes 15    METs 2.51           Social History   Tobacco Use  Smoking Status Never Smoker  Smokeless Tobacco Never Used    Goals Met:  Exercise tolerated well No report of cardiac concerns or symptoms Strength training completed today  Goals Unmet:  Not Applicable  Comments: Brenda Wilkins started cardiac rehab today.  Pt tolerated light exercise without difficulty. VSS, telemetry-Sinus Rhythm tall t wave, asymptomatic.  Medication list reconciled. Pt denies barriers to medicaiton compliance.  PSYCHOSOCIAL ASSESSMENT:  PHQ-0. Pt exhibits positive coping skills, hopeful outlook with supportive family. No psychosocial needs identified at this time, no psychosocial interventions necessary.   Pt oriented to exercise equipment and routine.    Understanding verbalized. Brenda Wilkins did well today and had no reports of chest pain. Brenda Pall, RN,BSN 10/25/2020 8:28  AM   Dr. Fransico Him is Medical Director for Cardiac Rehab at [facility].

## 2020-10-26 ENCOUNTER — Other Ambulatory Visit: Payer: Self-pay

## 2020-10-26 ENCOUNTER — Encounter (HOSPITAL_COMMUNITY): Payer: Self-pay | Admitting: Cardiovascular Disease

## 2020-10-26 ENCOUNTER — Encounter (HOSPITAL_COMMUNITY)
Admission: RE | Admit: 2020-10-26 | Discharge: 2020-10-26 | Disposition: A | Discharge status: Home / Self Care | Payer: Medicare HMO | Source: Ambulatory Visit | Attending: Cardiology | Admitting: Cardiology

## 2020-10-26 DIAGNOSIS — I214 Non-ST elevation (NSTEMI) myocardial infarction: Secondary | ICD-10-CM

## 2020-10-26 DIAGNOSIS — Z955 Presence of coronary angioplasty implant and graft: Secondary | ICD-10-CM | POA: Diagnosis not present

## 2020-10-27 NOTE — Progress Notes (Signed)
[facility] [address] [place] [address] Phone: [phone] Subjective:   Brenda Wilkins, am serving as a scribe for Dr. Antoine Primas. This visit occurred during the SARS-CoV-2 public health emergency.  Safety protocols were in place, including screening questions prior to the visit, additional usage of staff PPE, and extensive cleaning of exam room while observing appropriate contact time as indicated for disinfecting solutions.   I'm seeing this patient by the request  of:  Patient, No Pcp Per  CC: Left wrist pain  [id number]  Brenda Wilkins is a 75 y.o. female coming in with complaint of L wrist pain, non-dominant hand. Achy in nature. Insidious onset. States that her entire wrist hurts. No pattern to her pain. Denies any tingling. 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  Patient is wondering if gout could be contributing.  Patient denies ever being significantly swollen or red.  Just states that sometimes can get pain and does sometimes notice some decrease in range of motion.     Past Medical History:  Diagnosis Date  . Coronary artery disease   . Hyperlipidemia   . Hypertension   . NSTEMI (non-ST elevated myocardial infarction) (HCC) 07/16/2020  . 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 CARDIAC CATHETERIZATION  08/08/2020  . CORONARY ARTERY BYPASS GRAFT N/A 07/18/2020   Procedure: CORONARY ARTERY BYPASS GRAFTING (CABG), ON PUMP, TIMES THREE, USING ENDOSCOPICALLY HARVESTED RIGHT GREATER SAPHENOUS VEIN;  Surgeon: Alleen Borne, MD;  Location: [facility] OR;  Service: Open Heart Surgery;  Laterality: N/A;  . CORONARY STENT INTERVENTION N/A 10/03/2020   Procedure: CORONARY STENT INTERVENTION;  Surgeon: Runell Gess, MD;  Location: [facility] INVASIVE CV LAB;  Service: Cardiovascular;  Laterality: N/A;  . CORONARY/GRAFT ACUTE MI REVASCULARIZATION N/A 07/18/2020   Procedure: Coronary/Graft Acute MI Revascularization;   Surgeon: Swazil[place], Peter M, MD;  Location: [facility] INVASIVE CV LAB;  Service: Cardiovascular;  Laterality: N/A;  . INTRAVASCULAR PRESSURE WIRE/FFR STUDY N/A 10/03/2020   Procedure: INTRAVASCULAR PRESSURE WIRE/FFR STUDY;  Surgeon: Runell Gess, MD;  Location: [facility];  Service: Cardiovascular;  Laterality: N/A;  . LEFT HEART CATH AND CORONARY ANGIOGRAPHY N/A 07/18/2020   Procedure: LEFT HEART CATH AND CORONARY ANGIOGRAPHY;  Surgeon: Tonny Bollman, MD;  Location: [facility] INVASIVE CV LAB;  Service: Cardiovascular;  Laterality: N/A;  . LEFT HEART CATH AND CORS/GRAFTS ANGIOGRAPHY N/A 08/08/2020   Procedure: LEFT HEART CATH AND CORS/GRAFTS ANGIOGRAPHY;  Surgeon: Lennette Bihari, MD;  Location: [facility] INV[facility] LAB;  Service: Cardiovascular;  Laterality: N/A;  . LEFT HEART CATH AND CORS/GRAFTS ANGIOGRAPHY N/A 10/03/2020   Procedure: LEFT HEART CATH AND CORS/GRAFTS ANGIOGRAPHY;  Surgeon: Runell Gess, MD;  Location: [facility]VAS[facility];  Service: Cardiovascular;  Laterality: N/A;  . TEE WITHOUT CARDIOVERSION N/A 07/18/2020   Procedure: TRANSESOPHAGEAL ECHOCARDIOGRAM (TEE);  Surgeon: Alleen Borne, MD;  Location: [facility] OR;  Service: Open Heart Surgery;  Laterality: N/A;   Social History   Socioeconomic History  . Marital status: Unknown    Spouse name: Not on file  . Number of children: Not on file  . Years of education: 39  . Highest education level: Not on file  Occupational History  . Not on file  Tobacco Use  . Smoking status: Never Smoker  . Smokeless tobacco: Never Used  Vaping Use  . Vaping Use: Never used  Substance and Sexual Activity  . Alcohol use: Never  .  Drug use: Never  . Sexual activity: Not Currently  Other Topics Concern  . Not on file  Social History Narrative  . Not on file   Social Determinants of Health   Financial Resource Strain: Not on file  Food Insecurity: Not on file  Transportation Needs: Not on file  Physical Activity: Not on file  Stress: Not on file   Social Connections: Not on file   No Known Allergies No family history on file.   Current Outpatient Medications (Cardiovascular):  .  atorvastatin (LIPITOR) 80 MG tablet, Take 1 tablet (80 mg total) by mouth daily. .  metoprolol succinate (TOPROL-XL) 25 MG 24 hr tablet, Take 0.5 tablets (12.5 mg total) by mouth daily. .  nitroGLYCERIN (NITROSTAT) 0.4 MG SL tablet, Place 1 tablet (0.4 mg total) under the tongue every 5 (five) minutes x 3 doses as needed for chest pain.   Current Outpatient Medications (Analgesics):  .  acetaminophen (TYLENOL) 325 MG tablet, Take 325-650 mg by mouth every 6 (six) hours as needed for mild pain (or headaches). Marland Kitchen  aspirin 81 MG EC tablet, Take 1 tablet (81 mg total) by mouth daily. Swallow whole. .  colchicine 0.6 MG tablet, Take 1.2mg  (2 tablets) for the first dose and the 0.6mg (1 tablet) one hour later.  Current Outpatient Medications (Hematological):  .  ferrous fumarate-b12-vitamic C-folic acid (FEROCON) capsule, Take 1 capsule by mouth 2 (two) times daily. .  ticagrelor (BRILINTA) 90 MG TABS tablet, Take 1 tablet (90 mg total) by mouth 2 (two) times daily.    Reviewed prior external information including notes and imaging from  primary care provider As well as notes that were available from care everywhere and other healthcare systems.  Past medical history, social, surgical and family history all reviewed in electronic medical record.  No pertanent information unless stated regarding to the chief complaint.   Review of Systems:  No headache, visual changes, nausea, vomiting, diarrhea, constipation, dizziness, abdominal pain, skin rash, fevers, chills, night sweats, weight loss, swollen lymph nodes, body aches, joint swelling, chest pain, shortness of breath, mood changes.   Objective  Blood pressure 120/82, pulse 74, height 4\' 11"  (1.499 m), weight 138 lb (62.6 kg), SpO2 99 %.   General: No apparent distress alert and oriented x3 mood and  affect normal, dressed appropriately.  HEENT: Pupils equal, extraocular movements intact  Respiratory: Patient's speak in full sentences and does not appear short of breath  Cardiovascular: No lower extremity edema, non tender, no erythema  Gait normal with good balance and coordination.  MSK: Mild arthritic changes of multiple joints Patient's left wrist does have good range of motion noted.  Mild tenderness noted over the TFCC.  Good grip strength noted.  Neurovascularly intact.  No pain at the elbow.    Limited musculoskeletal ultrasound was performed and interpreted by  Limited ultrasound of patient's left wrist shows that at the scaphoid lunate joint there appears to be a very small cyst on the dorsal aspect.  Seems to be intra-articular.  Very small at this moment measuring 0.1 cm x 0.2 cm.  Patient also has what appears to be a very small tear in the TFCC area.  Hypoechoic changes in this area.  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   Impression and Recommendations:     The above documentation has been reviewed and is accurate and complete Judi Saa, DO

## 2020-10-28 ENCOUNTER — Other Ambulatory Visit: Payer: Self-pay

## 2020-10-28 ENCOUNTER — Encounter (HOSPITAL_COMMUNITY)
Admission: RE | Admit: 2020-10-28 | Discharge: 2020-10-28 | Disposition: A | Discharge status: Home / Self Care | Payer: Medicare HMO | Source: Ambulatory Visit | Attending: Cardiology | Admitting: Cardiology

## 2020-10-28 DIAGNOSIS — I214 Non-ST elevation (NSTEMI) myocardial infarction: Secondary | ICD-10-CM

## 2020-10-28 DIAGNOSIS — Z955 Presence of coronary angioplasty implant and graft: Secondary | ICD-10-CM | POA: Diagnosis not present

## 2020-10-31 ENCOUNTER — Ambulatory Visit: Payer: Medicare HMO | Admitting: Cardiology

## 2020-10-31 ENCOUNTER — Other Ambulatory Visit: Payer: Self-pay

## 2020-10-31 ENCOUNTER — Encounter (HOSPITAL_COMMUNITY)
Admission: RE | Admit: 2020-10-31 | Discharge: 2020-10-31 | Disposition: A | Discharge status: Home / Self Care | Payer: Medicare HMO | Source: Ambulatory Visit | Attending: Cardiology | Admitting: Cardiology

## 2020-10-31 DIAGNOSIS — Z955 Presence of coronary angioplasty implant and graft: Secondary | ICD-10-CM | POA: Diagnosis not present

## 2020-10-31 DIAGNOSIS — I214 Non-ST elevation (NSTEMI) myocardial infarction: Secondary | ICD-10-CM

## 2020-10-31 NOTE — Progress Notes (Signed)
Brenda Wilkins 75 y.o. female Nutrition Note  Diagnosis: NSTEMI  Past Medical History:  Diagnosis Date  . Coronary artery disease   . Hyperlipidemia   . Hypertension   . NSTEMI (non-ST elevated myocardial infarction) (HCC) 07/16/2020  . 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   Current Outpatient Medications:  .  acetaminophen (TYLENOL) 325 MG tablet, Take 325-650 mg by mouth every 6 (six) hours as needed for mild pain (or headaches)., Disp: , Rfl:  .  aspirin 81 MG EC tablet, Take 1 tablet (81 mg total) by mouth daily. Swallow whole., Disp: 30 tablet, Rfl: 11 .  atorvastatin (LIPITOR) 80 MG tablet, Take 1 tablet (80 mg total) by mouth daily., Disp: 90 tablet, Rfl: 3 .  colchicine 0.6 MG tablet, Take 1.2mg  (2 tablets) for the first dose and the 0.6mg (1 tablet) one hour later., Disp: 3 tablet, Rfl: 0 .  ferrous fumarate-b12-vitamic C-folic acid (FEROCON) capsule, Take 1 capsule by mouth 2 (two) times daily., Disp: , Rfl:  .  metoprolol succinate (TOPROL-XL) 25 MG 24 hr tablet, Take 0.5 tablets (12.5 mg total) by mouth daily., Disp: 90 tablet, Rfl: 3 .  nitroGLYCERIN (NITROSTAT) 0.4 MG SL tablet, Place 1 tablet (0.4 mg total) under the tongue every 5 (five) minutes x 3 doses as needed for chest pain., Disp: 25 tablet, Rfl: 0 .  ticagrelor (BRILINTA) 90 MG TABS tablet, Take 1 tablet (90 mg total) by mouth 2 (two) times daily., Disp: 180 tablet, Rfl: 3   Ht Readings from Last 1 Encounters:  10/20/20 4' 11.75" (1.518 m)     Wt Readings from Last 3 Encounters:  10/20/20 140 lb 6.4 oz (63.7 kg)  10/05/20 148 lb 5.9 oz (67.3 kg)  09/27/20 142 lb 10.2 oz (64.7 kg)     There is no height or weight on file to calculate BMI.   Social History   Tobacco Use  Smoking Status Never Smoker  Smokeless Tobacco Never Used     Lab Results  Component Value Date   CHOL 254 (H) 07/17/2020   Lab Results  Component Value Date   HDL 40 (L) 07/17/2020   Lab Results  Component  Value Date   LDLCALC 183 (H) 07/17/2020   Lab Results  Component Value Date   TRIG 153 (H) 07/17/2020     Lab Results  Component Value Date   HGBA1C 5.6 07/17/2020     CBG (last 3)  No results for input(s): GLUCAP in the last 72 hours.   Nutrition Note  Spoke with pt. Nutrition Plan and Nutrition Survey goals reviewed with pt. Pt is following a Heart Healthy diet. She made many diet changes after STEMI. She increased fruits and vegetables, switched to whole grains, and choosing lean proteins. She is reading labels and avoiding salt shaker. She eats out occasionally. She lost 20 lbs after STEMI. Reviewed lipid panel. Provided label reading education. She does not drink adequate water. She typically drinks <8 oz water per day and 2-3 bottles of diet coke.   Pt expressed understanding of the information reviewed.    Nutrition Diagnosis ? Food-and nutrition-related knowledge deficit related to lack of exposure to information as related to diagnosis of: ? CVD ?   Nutrition Intervention ? Pt's individual nutrition plan reviewed with pt. ? Benefits of adopting Heart Healthy diet discussed when Picture Your Plate reviewed. ? Continue client-centered nutrition education by RD, as part of interdisciplinary care.  Goal(s) ? Pt to build a  healthy plate including vegetables, fruits, whole grains, and low-fat dairy products in a heart healthy meal plan. ? Pt to read labels to reduce saturated fat to <16 g per day and increase fiber >28 g per day  Plan:   Will provide client-centered nutrition education as part of interdisciplinary care  Monitor and evaluate progress toward nutrition goal with team.   Andrey Campanile, MS, RDN, LDN

## 2020-10-31 NOTE — Progress Notes (Signed)
Cardiac Individual Treatment Plan  Patient Details  Name: Caili Escalera MRN: [id number] Date of Birth: 09-Sep-1945 Referring Provider:   Flowsheet Row CARDIAC REHAB PHASE II ORIENTATION from 09/27/2020 in [facility] CARDIAC REHAB  Referring Provider Meriam Sprague, MD      Initial Encounter Date:  Flowsheet Row CARDIAC REHAB PHASE II ORIENTATION from 09/27/2020 in [facility] CARDIAC REHAB  Date 09/27/20      Visit Diagnosis: 08/07/2020 NSTEMI  Patient's Home Medications on Admission:  Current Outpatient Medications:  .  acetaminophen (TYLENOL) 325 MG tablet, Take 325-650 mg by mouth every 6 (six) hours as needed for mild pain (or headaches)., Disp: , Rfl:  .  aspirin 81 MG EC tablet, Take 1 tablet (81 mg total) by mouth daily. Swallow whole., Disp: 30 tablet, Rfl: 11 .  atorvastatin (LIPITOR) 80 MG tablet, Take 1 tablet (80 mg total) by mouth daily., Disp: 90 tablet, Rfl: 3 .  colchicine 0.6 MG tablet, Take 1.2mg  (2 tablets) for the first dose and the 0.6mg (1 tablet) one hour later., Disp: 3 tablet, Rfl: 0 .  ferrous fumarate-b12-vitamic C-folic acid (FEROCON) capsule, Take 1 capsule by mouth 2 (two) times daily., Disp: , Rfl:  .  metoprolol succinate (TOPROL-XL) 25 MG 24 hr tablet, Take 0.5 tablets (12.5 mg total) by mouth daily., Disp: 90 tablet, Rfl: 3 .  nitroGLYCERIN (NITROSTAT) 0.4 MG SL tablet, Place 1 tablet (0.4 mg total) under the tongue every 5 (five) minutes x 3 doses as needed for chest pain., Disp: 25 tablet, Rfl: 0 .  ticagrelor (BRILINTA) 90 MG TABS tablet, Take 1 tablet (90 mg total) by mouth 2 (two) times daily., Disp: 180 tablet, Rfl: 3  Past Medical History: Past Medical History:  Diagnosis Date  . Coronary artery disease   . Hyperlipidemia   . Hypertension   . NSTEMI (non-ST elevated myocardial infarction) (HCC) 07/16/2020  . 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    Tobacco Use: Social History   Tobacco Use  Smoking Status Never  Smoker  Smokeless Tobacco Never Used    Labs: Recent Review Flowsheet Data    Labs for ITP Cardiac and Pulmonary Rehab Latest Ref Rng & Units 07/18/2020 07/19/2020 07/19/2020 07/19/2020 07/19/2020   Cholestrol 0 - 200 mg/dL - - - - -   LDLCALC 0 - 99 mg/dL - - - - -   HDL >57 mg/dL - - - - -   Trlycerides <150 mg/dL - - - - -   Hemoglobin A1c 4.8 - 5.6 % - - - - -   PHART 7.350 - 7.450 7.350 7.372 7.376 7.387 7.353   PCO2ART 32.0 - 48.0 mmHg 50.4(H) 48.5(H) 45.1 42.2 44.8   HCO3 20.0 - 28.0 mmol/L 27.5 28.0 26.2 25.2 24.7   TCO2 22 - 32 mmol/L ACIDBASEDEF 0.0 - 2.0 mmol/L - - - - 1.0   O2SAT % 100.0 98.0 98.0 97.0 96.0      Capillary Blood Glucose: Lab Results  Component Value Date   GLUCAP 89 07/21/2020   GLUCAP 95 07/21/2020   GLUCAP 100 (H) 07/21/2020   GLUCAP 93 07/21/2020   GLUCAP 104 (H) 07/20/2020     Exercise Target Goals: Exercise Program Goal: Individual exercise prescription set using results from initial 6 min walk test and THRR while considering  patient's activity barriers and safety.   Exercise Prescription Goal: Initial exercise prescription builds to 30-45 minutes a day of aerobic activity, 2-3 days  per week.  Home exercise guidelines will be given to patient during program as part of exercise prescription that the participant will acknowledge.  Activity Barriers & Risk Stratification:  Activity Barriers & Cardiac Risk Stratification - 09/27/20 1022      Activity Barriers & Cardiac Risk Stratification   Activity Barriers History of Falls    Cardiac Risk Stratification High           6 Minute Walk:  6 Minute Walk    Row Name 09/27/20 1051         6 Minute Walk   Phase Initial     Distance 1107 feet     Walk Time 6 minutes     # of Rest Breaks 0     MPH 2.1     METS 2.36     RPE 9     Perceived Dyspnea  0     VO2 Peak 8.27     Symptoms No     Resting HR 80 bpm     Resting BP 102/68     Resting Oxygen Saturation  98 %      Exercise Oxygen Saturation  during 6 min walk 99 %     Max Ex. HR 111 bpm     Max Ex. BP 128/74     2 Minute Post BP 110/69            Oxygen Initial Assessment:   Oxygen Re-Evaluation:   Oxygen Discharge (Final Oxygen Re-Evaluation):   Initial Exercise Prescription:  Initial Exercise Prescription - 09/27/20 1100      Date of Initial Exercise RX and Referring Provider   Date 09/27/20    Referring Provider Meriam SpraguePemberton, Heather E, MD    Expected Discharge Date 11/25/20      NuStep   Level 1    SPM 85    Minutes 15    METs 2.3      Track   Laps 12    Minutes 15    METs 2.39      Prescription Details   Frequency (times per week) 3    Duration Progress to 30 minutes of continuous aerobic without signs/symptoms of physical distress      Intensity   THRR 40-80% of Max Heartrate 59-118    Ratings of Perceived Exertion 11-13    Perceived Dyspnea 0-4      Progression   Progression Continue to progress workloads to maintain intensity without signs/symptoms of physical distress.      Resistance Training   Training Prescription Yes    Weight 2 lbs    Reps 10-15           Perform Capillary Blood Glucose checks as needed.  Exercise Prescription Changes:   Exercise Prescription Changes    Row Name 10/24/20 1047 10/31/20 1045           Response to Exercise   Blood Pressure (Admit) 102/58 124/76      Blood Pressure (Exercise) 122/78 104/72      Blood Pressure (Exit) 102/72 104/68      Heart Rate (Admit) 93 bpm 92 bpm      Heart Rate (Exercise) 108 bpm 108 bpm      Heart Rate (Exit) 87 bpm 78 bpm      Rating of Perceived Exertion (Exercise) 11 9      Symptoms none none      Comments Off to a good start with exercise. --      Duration  Continue with 30 min of aerobic exercise without signs/symptoms of physical distress. Continue with 30 min of aerobic exercise without signs/symptoms of physical distress.      Intensity THRR unchanged THRR unchanged              Progression   Progression Continue to progress workloads to maintain intensity without signs/symptoms of physical distress. Continue to progress workloads to maintain intensity without signs/symptoms of physical distress.      Average METs 2 2.2             Resistance Training   Training Prescription Yes Yes      Weight 2 lbs 2 lbs      Reps 10-15 10-15      Time 10 Minutes 10 Minutes             Interval Training   Interval Training No No             NuStep   Level 1 1      SPM 85 85      Minutes 15 15      METs 1.6 1.6             Track   Laps 13 16      Minutes 15 15      METs 2.51 2.86             Exercise Comments:   Exercise Comments    Row Name 10/24/20 1145           Exercise Comments Patient tolerated low intensity exercise without symptoms.              Exercise Goals and Review:   Exercise Goals    Row Name 09/27/20 1033             Exercise Goals   Increase Physical Activity Yes       Intervention Provide advice, education, support and counseling about physical activity/exercise needs.;Develop an individualized exercise prescription for aerobic and resistive training based on initial evaluation findings, risk stratification, comorbidities and participant's personal goals.       Expected Outcomes Short Term: Attend rehab on a regular basis to increase amount of physical activity.;Long Term: Add in home exercise to make exercise part of routine and to increase amount of physical activity.;Long Term: Exercising regularly at least 3-5 days a week.       Increase Strength and Stamina Yes       Intervention Provide advice, education, support and counseling about physical activity/exercise needs.;Develop an individualized exercise prescription for aerobic and resistive training based on initial evaluation findings, risk stratification, comorbidities and participant's personal goals.       Expected Outcomes Short Term: Increase workloads from  initial exercise prescription for resistance, speed, and METs.;Long Term: Improve cardiorespiratory fitness, muscular endurance and strength as measured by increased METs and functional capacity ( );Short Term: Perform resistance training exercises routinely during rehab and add in resistance training at home       Able to understand and use rate of perceived exertion (RPE) scale Yes       Intervention Provide education and explanation on how to use RPE scale       Expected Outcomes Short Term: Able to use RPE daily in rehab to express subjective intensity level;Long Term:  Able to use RPE to guide intensity level when exercising independently       Knowledge and understanding of Target Heart Rate Range (THRR) Yes  Intervention Provide education and explanation of THRR including how the numbers were predicted and where they are located for reference       Expected Outcomes Short Term: Able to state/look up THRR;Long Term: Able to use THRR to govern intensity when exercising independently;Short Term: Able to use daily as guideline for intensity in rehab       Able to check pulse independently Yes       Intervention Provide education and demonstration on how to check pulse in carotid and radial arteries.;Review the importance of being able to check your own pulse for safety during independent exercise       Expected Outcomes Short Term: Able to explain why pulse checking is important during independent exercise;Long Term: Able to check pulse independently and accurately       Understanding of Exercise Prescription Yes       Intervention Provide education, explanation, and written materials on patient's individual exercise prescription       Expected Outcomes Short Term: Able to explain program exercise prescription;Long Term: Able to explain home exercise prescription to exercise independently              Exercise Goals Re-Evaluation :  Exercise Goals Re-Evaluation    Row Name 10/24/20  1145             Exercise Goal Re-Evaluation   Exercise Goals Review Increase Physical Activity;Able to understand and use rate of perceived exertion (RPE) scale       Comments Patient able to understand and use RPE scale appropriately.       Expected Outcomes Progress workloads as tolerated to help increase cardiorespiratory fitness.              Discharge Exercise Prescription (Final Exercise Prescription Changes):  Exercise Prescription Changes - 10/31/20 1045      Response to Exercise   Blood Pressure (Admit) 124/76    Blood Pressure (Exercise) 104/72    Blood Pressure (Exit) 104/68    Heart Rate (Admit) 92 bpm    Heart Rate (Exercise) 108 bpm    Heart Rate (Exit) 78 bpm    Rating of Perceived Exertion (Exercise) 9    Symptoms none    Duration Continue with 30 min of aerobic exercise without signs/symptoms of physical distress.    Intensity THRR unchanged      Progression   Progression Continue to progress workloads to maintain intensity without signs/symptoms of physical distress.    Average METs 2.2      Resistance Training   Training Prescription Yes    Weight 2 lbs    Reps 10-15    Time 10 Minutes      Interval Training   Interval Training No      NuStep   Level 1    SPM 85    Minutes 15    METs 1.6      Track   Laps 16    Minutes 15    METs 2.86           Nutrition:  Target Goals: Understanding of nutrition guidelines, daily intake of sodium 1500mg , cholesterol 200mg , calories 30% from fat and 7% or less from saturated fats, daily to have 5 or more servings of fruits and vegetables.  Biometrics:  Pre Biometrics - 09/27/20 1023      Pre Biometrics   Waist Circumference 36.75 inches    Hip Circumference 39.75 inches    Waist to Hip Ratio 0.92 %    Triceps  Skinfold 26 mm    % Body Fat 40.5 %    Grip Strength 25.5 kg    Flexibility 14.63 in    Single Leg Stand 25.93 seconds            Nutrition Therapy Plan and Nutrition Goals:   Nutrition Therapy & Goals - 10/31/20 1151      Nutrition Therapy   Diet TLC    Drug/Food Interactions Statins/Certain Fruits      Personal Nutrition Goals   Nutrition Goal Pt to build a healthy plate including vegetables, fruits, whole grains, and low-fat dairy products in a heart healthy meal plan.    Personal Goal #2 Pt to read labels to reduce saturated fat to <16 g per day and increase fiber >28 g per day      Intervention Plan   Intervention Prescribe, educate and counsel regarding individualized specific dietary modifications aiming towards targeted core components such as weight, hypertension, lipid management, diabetes, heart failure and other comorbidities.;Nutrition handout(s) given to patient.    Expected Outcomes Short Term Goal: Understand basic principles of dietary content, such as calories, fat, sodium, cholesterol and nutrients.;Long Term Goal: Adherence to prescribed nutrition plan.           Nutrition Assessments:  MEDIFICTS Score Key:  ?70 Need to make dietary changes   40-70 Heart Healthy Diet  ? 40 Therapeutic Level Cholesterol Diet   Flowsheet Row CARDIAC REHAB PHASE II EXERCISE from 10/31/2020 in [facility] CARDIAC REHAB  Picture Your Plate Total Score on Admission 76     Picture Your Plate Scores:  <49 Unhealthy dietary pattern with much room for improvement.  41-50 Dietary pattern unlikely to meet recommendations for good health and room for improvement.  51-60 More healthful dietary pattern, with some room for improvement.   >60 Healthy dietary pattern, although there may be some specific behaviors that could be improved.    Nutrition Goals Re-Evaluation:  Nutrition Goals Re-Evaluation    Row Name 10/31/20 1152             Goals   Current Weight 140 lb (63.5 kg)       Nutrition Goal Pt to build a healthy plate including vegetables, fruits, whole grains, and low-fat dairy products in a heart healthy meal plan.                Personal Goal #2 Re-Evaluation   Personal Goal #2 Pt to read labels to reduce saturated fat to <16 g per day and increase fiber >28 g per day              Nutrition Goals Re-Evaluation:  Nutrition Goals Re-Evaluation    Row Name 10/31/20 1152             Goals   Current Weight 140 lb (63.5 kg)       Nutrition Goal Pt to build a healthy plate including vegetables, fruits, whole grains, and low-fat dairy products in a heart healthy meal plan.               Personal Goal #2 Re-Evaluation   Personal Goal #2 Pt to read labels to reduce saturated fat to <16 g per day and increase fiber >28 g per day              Nutrition Goals Discharge (Final Nutrition Goals Re-Evaluation):  Nutrition Goals Re-Evaluation - 10/31/20 1152      Goals   Current Weight 140 lb (63.5 kg)  Nutrition Goal Pt to build a healthy plate including vegetables, fruits, whole grains, and low-fat dairy products in a heart healthy meal plan.      Personal Goal #2 Re-Evaluation   Personal Goal #2 Pt to read labels to reduce saturated fat to <16 g per day and increase fiber >28 g per day           Psychosocial: Target Goals: Acknowledge presence or absence of significant depression and/or stress, maximize coping skills, provide positive support system. Participant is able to verbalize types and ability to use techniques and skills needed for reducing stress and depression.  Initial Review & Psychosocial Screening:  Initial Psych Review & Screening - 09/27/20 1129      Initial Review   Current issues with Current Stress Concerns    Source of Stress Concerns Chronic Illness    Comments Debby says she had some stress when she initially had her open heart surgery and MI. Debby says that she is better now      [facility]   Good Support System? Yes   Debby lives alone but has her son and daughter for support.     Barriers   Psychosocial barriers to participate in program The patient should  benefit from training in stress management and relaxation.      Screening Interventions   Interventions Encouraged to exercise    Expected Outcomes Long Term Goal: Stressors or current issues are controlled or eliminated.           Quality of Life Scores:  Quality of Life - 09/27/20 1151      Quality of Life   Select Quality of Life      Quality of Life Scores   Health/Function Pre 26.65 %    Socioeconomic Pre 27.36 %    Psych/Spiritual Pre 28.79 %    Family Pre 30 %    GLOBAL Pre 27.73 %          Scores of 19 and below usually indicate a poorer quality of life in these areas.  A difference of  2-3 points is a clinically meaningful difference.  A difference of 2-3 points in the total score of the Quality of Life Index has been associated with significant improvement in overall quality of life, self-image, physical symptoms, and general health in studies assessing change in quality of life.  PHQ-9: Recent Review Flowsheet Data    Depression screen [facility] 2/9 09/27/2020   Decreased Interest 0   Down, Depressed, Hopeless 0   PHQ - 2 Score 0     Interpretation of Total Score  Total Score Depression Severity:  1-4 = Minimal depression, 5-9 = Mild depression, 10-14 = Moderate depression, 15-19 = Moderately severe depression, 20-27 = Severe depression   Psychosocial Evaluation and Intervention:   Psychosocial Re-Evaluation:  Psychosocial Re-Evaluation    Row Name 10/31/20 1717             Psychosocial Re-Evaluation   Current issues with Current Stress Concerns       Comments Debby denies having decreased stress concerns despite her recent Stent placement in [place]       Expected Outcomes Debby will have decreased stressors upon completion of phase 2 cardiac rehab.       Interventions Encouraged to attend Cardiac Rehabilitation for the exercise;Relaxation education;Stress management education       Continue Psychosocial Services  Follow up required by staff        Comments Will continue to monitor  and offer support as needed               Initial Review   Source of Stress Concerns Chronic Illness              Psychosocial Discharge (Final Psychosocial Re-Evaluation):  Psychosocial Re-Evaluation - 10/31/20 1717      Psychosocial Re-Evaluation   Current issues with Current Stress Concerns    Comments Debby denies having decreased stress concerns despite her recent Stent placement in [place]    Expected Outcomes Debby will have decreased stressors upon completion of phase 2 cardiac rehab.    Interventions Encouraged to attend Cardiac Rehabilitation for the exercise;Relaxation education;Stress management education    Continue Psychosocial Services  Follow up required by staff    Comments Will continue to monitor and offer support as needed      Initial Review   Source of Stress Concerns Chronic Illness           Vocational Rehabilitation: Provide vocational rehab assistance to qualifying candidates.   Vocational Rehab Evaluation & Intervention:  Vocational Rehab - 09/27/20 1135      Initial Vocational Rehab Evaluation & Intervention   Assessment shows need for Vocational Rehabilitation No   Debby works full time and does not need vocational rehab at this time.          Education: Education Goals: Education classes will be provided on a weekly basis, covering required topics. Participant will state understanding/return demonstration of topics presented.  Learning Barriers/Preferences:  Learning Barriers/Preferences - 09/27/20 1133      Learning Barriers/Preferences   Learning Barriers Sight   wears contacts and glasses   Learning Preferences Audio;Computer/Internet;Group Instruction;Individual Instruction;Pictoral;Skilled Demonstration;Verbal Instruction;Video;Written Material           Education Topics: Count Your Pulse:  -Group instruction provided by verbal instruction, demonstration, patient participation and written  materials to support subject.  Instructors address importance of being able to find your pulse and how to count your pulse when at home without a heart monitor.  Patients get hands on experience counting their pulse with staff help and individually.   Heart Attack, Angina, and Risk Factor Modification:  -Group instruction provided by verbal instruction, video, and written materials to support subject.  Instructors address signs and symptoms of angina and heart attacks.    Also discuss risk factors for heart disease and how to make changes to improve heart health risk factors.   Functional Fitness:  -Group instruction provided by verbal instruction, demonstration, patient participation, and written materials to support subject.  Instructors address safety measures for doing things around the house.  Discuss how to get up and down off the floor, how to pick things up properly, how to safely get out of a chair without assistance, and balance training.   Meditation and Mindfulness:  -Group instruction provided by verbal instruction, patient participation, and written materials to support subject.  Instructor addresses importance of mindfulness and meditation practice to help reduce stress and improve awareness.  Instructor also leads participants through a meditation exercise.    Stretching for Flexibility and Mobility:  -Group instruction provided by verbal instruction, patient participation, and written materials to support subject.  Instructors lead participants through series of stretches that are designed to increase flexibility thus improving mobility.  These stretches are additional exercise for major muscle groups that are typically performed during regular warm up and cool down.   Hands Only CPR:  -Group verbal, video, and participation provides a basic overview  of AHA guidelines for community CPR. Role-play of emergencies allow participants the opportunity to practice calling for help and  chest compression technique with discussion of AED use.   Hypertension: -Group verbal and written instruction that provides a basic overview of hypertension including the most recent diagnostic guidelines, risk factor reduction with self-care instructions and medication management.    Nutrition I class: Heart Healthy Eating:  -Group instruction provided by PowerPoint slides, verbal discussion, and written materials to support subject matter. The instructor gives an explanation and review of the Therapeutic Lifestyle Changes diet recommendations, which includes a discussion on lipid goals, dietary fat, sodium, fiber, plant stanol/sterol esters, sugar, and the components of a well-balanced, healthy diet.   Nutrition II class: Lifestyle Skills:  -Group instruction provided by PowerPoint slides, verbal discussion, and written materials to support subject matter. The instructor gives an explanation and review of label reading, grocery shopping for heart health, heart healthy recipe modifications, and ways to make healthier choices when eating out.   Diabetes Question & Answer:  -Group instruction provided by PowerPoint slides, verbal discussion, and written materials to support subject matter. The instructor gives an explanation and review of diabetes co-morbidities, pre- and post-prandial blood glucose goals, pre-exercise blood glucose goals, signs, symptoms, and treatment of hypoglycemia and hyperglycemia, and foot care basics.   Diabetes Blitz:  -Group instruction provided by PowerPoint slides, verbal discussion, and written materials to support subject matter. The instructor gives an explanation and review of the physiology behind type 1 and type 2 diabetes, diabetes medications and rational behind using different medications, pre- and post-prandial blood glucose recommendations and Hemoglobin A1c goals, diabetes diet, and exercise including blood glucose guidelines for exercising safely.     Portion Distortion:  -Group instruction provided by PowerPoint slides, verbal discussion, written materials, and food models to support subject matter. The instructor gives an explanation of serving size versus portion size, changes in portions sizes over the last 20 years, and what consists of a serving from each food group.   Stress Management:  -Group instruction provided by verbal instruction, video, and written materials to support subject matter.  Instructors review role of stress in heart disease and how to cope with stress positively.     Exercising on Your Own:  -Group instruction provided by verbal instruction, power point, and written materials to support subject.  Instructors discuss benefits of exercise, components of exercise, frequency and intensity of exercise, and end points for exercise.  Also discuss use of nitroglycerin and activating EMS.  Review options of places to exercise outside of rehab.  Review guidelines for sex with heart disease.   Cardiac Drugs I:  -Group instruction provided by verbal instruction and written materials to support subject.  Instructor reviews cardiac drug classes: antiplatelets, anticoagulants, beta blockers, and statins.  Instructor discusses reasons, side effects, and lifestyle considerations for each drug class.   Cardiac Drugs II:  -Group instruction provided by verbal instruction and written materials to support subject.  Instructor reviews cardiac drug classes: angiotensin converting enzyme inhibitors (ACE-I), angiotensin II receptor blockers (ARBs), nitrates, and calcium channel blockers.  Instructor discusses reasons, side effects, and lifestyle considerations for each drug class.   Anatomy and Physiology of the Circulatory System:  Group verbal and written instruction and models provide basic cardiac anatomy and physiology, with the coronary electrical and arterial systems. Review of: AMI, Angina, Valve disease, Heart Failure,  Peripheral Artery Disease, Cardiac Arrhythmia, Pacemakers, and the ICD.   Other Education:  -Group or individual  verbal, written, or video instructions that support the educational goals of the cardiac rehab program.   Holiday Eating Survival Tips:  -Group instruction provided by PowerPoint slides, verbal discussion, and written materials to support subject matter. The instructor gives patients tips, tricks, and techniques to help them not only survive but enjoy the holidays despite the onslaught of food that accompanies the holidays.   Knowledge Questionnaire Score:  Knowledge Questionnaire Score - 09/27/20 1151      Knowledge Questionnaire Score   Pre Score 22/24           Core Components/Risk Factors/Patient Goals at Admission:  Personal Goals and Risk Factors at Admission - 09/27/20 1147      Core Components/Risk Factors/Patient Goals on Admission    Weight Management Weight Maintenance;Yes    Intervention Weight Management: Develop a combined nutrition and exercise program designed to reach desired caloric intake, while maintaining appropriate intake of nutrient and fiber, sodium and fats, and appropriate energy expenditure required for the weight goal.;Weight Management: Provide education and appropriate resources to help participant work on and attain dietary goals.    Expected Outcomes Long Term: Adherence to nutrition and physical activity/exercise program aimed toward attainment of established weight goal;Weight Maintenance: Understanding of the daily nutrition guidelines, which includes 25-35% calories from fat, 7% or less cal from saturated fats, less than 200mg  cholesterol, less than 1.5gm of sodium, & 5 or more servings of fruits and vegetables daily;Understanding of distribution of calorie intake throughout the day with the consumption of 4-5 meals/snacks    Hypertension Yes    Intervention Provide education on lifestyle modifcations including regular physical  activity/exercise, weight management, moderate sodium restriction and increased consumption of fresh fruit, vegetables, and low fat dairy, alcohol moderation, and smoking cessation.;Monitor prescription use compliance.    Expected Outcomes Short Term: Continued assessment and intervention until BP is < 140/64mm HG in hypertensive participants. < 130/10mm HG in hypertensive participants with diabetes, heart failure or chronic kidney disease.;Long Term: Maintenance of blood pressure at goal levels.    Lipids Yes    Intervention Provide education and support for participant on nutrition & aerobic/resistive exercise along with prescribed medications to achieve LDL 70mg , HDL >40mg .    Expected Outcomes Short Term: Participant states understanding of desired cholesterol values and is compliant with medications prescribed. Participant is following exercise prescription and nutrition guidelines.;Long Term: Cholesterol controlled with medications as prescribed, with individualized exercise RX and with personalized nutrition plan. Value goals: LDL < 70mg , HDL > 40 mg.    Stress Yes    Intervention Offer individual and/or small group education and counseling on adjustment to heart disease, stress management and health-related lifestyle change. Teach and support self-help strategies.;Refer participants experiencing significant psychosocial distress to appropriate mental health specialists for further evaluation and treatment. When possible, include family members and significant others in education/counseling sessions.    Expected Outcomes Short Term: Participant demonstrates changes in health-related behavior, relaxation and other stress management skills, ability to obtain effective social support, and compliance with psychotropic medications if prescribed.;Long Term: Emotional wellbeing is indicated by absence of clinically significant psychosocial distress or social isolation.           Core Components/Risk  Factors/Patient Goals Review:   Goals and Risk Factor Review    Row Name 10/25/20 1149 10/31/20 1720           Core Components/Risk Factors/Patient Goals Review   Personal Goals Review Weight Management/Obesity;Hypertension;Lipids;Stress Weight Management/Obesity;Hypertension;Lipids;Stress      Review Debby started  exercise on 10/24/20 and did well with exercise. Debby continues to do well with exercise.Vital signs have been stable. Debby has not had any reports of chest pain and is doing well so far      Expected Outcomes Debby will continue to participate in phase 2 cardiac rehab for exercise, nutrition and lifestyle modifications Debby will continue to participate in phase 2 cardiac rehab for exercise, nutrition and lifestyle modifications             Core Components/Risk Factors/Patient Goals at Discharge (Final Review):   Goals and Risk Factor Review - 10/31/20 1720      Core Components/Risk Factors/Patient Goals Review   Personal Goals Review Weight Management/Obesity;Hypertension;Lipids;Stress    Review Debby continues to do well with exercise.Vital signs have been stable. Debby has not had any reports of chest pain and is doing well so far    Expected Outcomes Debby will continue to participate in phase 2 cardiac rehab for exercise, nutrition and lifestyle modifications           ITP Comments:  ITP Comments    Row Name 09/27/20 1032 10/04/20 1338 10/25/20 1147       ITP Comments Medical Director- Dr. Armanda Magic, MD 30 Day ITP Review. Patient is currently in the hospital. Exercise is currently on hold 30 Day ITP Reivew. Debbie started exerise on 10/24/20 and did well with exercise. Debby is post DES Diag 10/02/20            Comments: See ITP comments.

## 2020-11-01 ENCOUNTER — Ambulatory Visit: Payer: Self-pay

## 2020-11-01 ENCOUNTER — Ambulatory Visit (INDEPENDENT_AMBULATORY_CARE_PROVIDER_SITE_OTHER): Payer: Medicare HMO

## 2020-11-01 ENCOUNTER — Encounter: Payer: Self-pay | Admitting: Family Medicine

## 2020-11-01 ENCOUNTER — Ambulatory Visit: Payer: Medicare HMO | Admitting: Family Medicine

## 2020-11-01 VITALS — BP 120/82 | HR 74 | Ht 59.0 in | Wt 138.0 lb

## 2020-11-01 DIAGNOSIS — M25532 Pain in left wrist: Secondary | ICD-10-CM

## 2020-11-01 NOTE — Assessment & Plan Note (Addendum)
Discussed with patient in great length.  Patient does have a questionable cyst within the scaphoid lunate area.  Very small at this time.  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  Also underlying arthritic changes.  All of these could be contributing to some of his aches and pains that patient is having.  At this moment we will try some range of motion exercises very minimal.  Discussed avoiding certain things that could be potentially associated with gout as well.  We discussed possible tart cherry supplementation.  X-rays are pending.  Follow-up with me again in 4 to 6 weeks worsening pain can consider formal physical therapy and will consider repeat ultrasound to further evaluate the cyst.

## 2020-11-01 NOTE — Patient Instructions (Signed)
Arthritis and likely gout and a small cyst Xray today Pennsaid 2x a day, then switch to Voltaren gel or Arnica lotion Vit D 2000IU daily Tart Cherry Extract 1200mg  at night See me again in 6 weeks

## 2020-11-02 ENCOUNTER — Encounter (HOSPITAL_COMMUNITY)
Admission: RE | Admit: 2020-11-02 | Discharge: 2020-11-02 | Disposition: A | Discharge status: Home / Self Care | Payer: Medicare HMO | Source: Ambulatory Visit | Attending: Cardiology | Admitting: Cardiology

## 2020-11-02 ENCOUNTER — Other Ambulatory Visit: Payer: Self-pay

## 2020-11-02 DIAGNOSIS — Z955 Presence of coronary angioplasty implant and graft: Secondary | ICD-10-CM | POA: Diagnosis not present

## 2020-11-02 DIAGNOSIS — I214 Non-ST elevation (NSTEMI) myocardial infarction: Secondary | ICD-10-CM

## 2020-11-04 ENCOUNTER — Encounter (HOSPITAL_COMMUNITY)
Admission: RE | Admit: 2020-11-04 | Discharge: 2020-11-04 | Disposition: A | Discharge status: Home / Self Care | Payer: Medicare HMO | Source: Ambulatory Visit | Attending: Cardiology | Admitting: Cardiology

## 2020-11-04 ENCOUNTER — Other Ambulatory Visit: Payer: Self-pay

## 2020-11-04 DIAGNOSIS — I214 Non-ST elevation (NSTEMI) myocardial infarction: Secondary | ICD-10-CM | POA: Insufficient documentation

## 2020-11-07 ENCOUNTER — Other Ambulatory Visit: Payer: Self-pay

## 2020-11-07 ENCOUNTER — Encounter (HOSPITAL_COMMUNITY)
Admission: RE | Admit: 2020-11-07 | Discharge: 2020-11-07 | Disposition: A | Discharge status: Home / Self Care | Payer: Medicare HMO | Source: Ambulatory Visit | Attending: Cardiology | Admitting: Cardiology

## 2020-11-07 DIAGNOSIS — I214 Non-ST elevation (NSTEMI) myocardial infarction: Secondary | ICD-10-CM

## 2020-11-07 NOTE — Progress Notes (Signed)
Reviewed home exercise guidelines with patient including endpoints, temperature precautions, target heart rate and rate of perceived exertion. Patient is currently walking 20 minutes, 2-3  days/week as her mode of home exercise. Patient voices understanding of instructions given.  Artist Pais, MS, ACSM CEP

## 2020-11-09 ENCOUNTER — Encounter (HOSPITAL_COMMUNITY)
Admission: RE | Admit: 2020-11-09 | Discharge: 2020-11-09 | Disposition: A | Discharge status: Home / Self Care | Payer: Medicare HMO | Source: Ambulatory Visit | Attending: Cardiology | Admitting: Cardiology

## 2020-11-09 ENCOUNTER — Other Ambulatory Visit: Payer: Self-pay

## 2020-11-09 DIAGNOSIS — I214 Non-ST elevation (NSTEMI) myocardial infarction: Secondary | ICD-10-CM | POA: Diagnosis not present

## 2020-11-11 ENCOUNTER — Other Ambulatory Visit: Payer: Self-pay

## 2020-11-11 ENCOUNTER — Encounter (HOSPITAL_COMMUNITY)
Admission: RE | Admit: 2020-11-11 | Discharge: 2020-11-11 | Disposition: A | Discharge status: Home / Self Care | Payer: Medicare HMO | Source: Ambulatory Visit | Attending: Cardiology | Admitting: Cardiology

## 2020-11-11 DIAGNOSIS — I214 Non-ST elevation (NSTEMI) myocardial infarction: Secondary | ICD-10-CM | POA: Diagnosis not present

## 2020-11-12 NOTE — Progress Notes (Deleted)
Cardiology Office Note:    Date:  11/12/2020   ID:  Brenda Wilkins, DOB [birth year], MRN [id number]  PCP:  Patient, No Pcp Per (Inactive)   [facility]  Cardiologist:  Meriam Sprague, MD  Advanced Practice Provider:  No care team member to display Electrophysiologist:  None   Referring MD: No ref. provider found     History of Present Illness:    Brenda Wilkins is a 75 y.o. female with a hx of with a hx of known CAD s/p CABG 08/05/21 with subsequent NSTEMIs (08/07/20 and 09/29/20) found to have multiple occluded grafts s/p PCI to SVG-Diag as detailed below and HLD who presents to clinic for follow-up.  The patient suffered from anNSTEMI in early December found to have multivessel CAD on cath 07/17/20 s/p 3V CABG on 07/18/20(SVG to LAD, sequential SVG to Diag and OM).   Post-operative course was complicated by recurrent chest pain causing her tore-presentto theEDon 08/07/20. In the ED, she was found tohave elevated troponin consistent with NSTEMI. She underwent repeat cath, whichrevealedoccluded SVG-LAD graft but TIMI 3 flow down LAD from SVG-Diag graft.Case was discussed with CV surgery and given brisk TIMI 3 flow down the LAD from the other bypass graft, the decision was made to manage medically.  She was doing well until 09/29/20 where she developed progressive chest pressure. She presented to [facility] on 10/02/20 and underwent coronary angiography where she had 95% stenosis of the graft before the 1st diag and 100% stenosis of the origin of the graft between the first Diag and OM2. A DES was placed at the origin of the diagonal branch SVG. Her post-cath course was complicated by a right groin hematoma and pseudoaneurysm. She was managed conservatively.  Called the office prior to her visit on 10/17/20 as she noted increased swelling in her groin with a palpable firm mass at prior groin access site. CBC was obtained which showed hemoglobin 12.6 which was  improved from 10.3 on discharge. Ultrasound consistent with hematoma but no evidence of pseudoaneurysm.   During her visit, she was stable and tolerating her medications. Groin site with sizable firm hematoma which she had been monitoring and was not increasing.   Past Medical History:  Diagnosis Date  . Coronary artery disease   . Hyperlipidemia   . Hypertension   . NSTEMI (non-ST elevated myocardial infarction) (HCC) 07/16/2020  . 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 CARDIAC CATHETERIZATION  08/08/2020  . CORONARY ARTERY BYPASS GRAFT N/A 07/18/2020   Procedure: CORONARY ARTERY BYPASS GRAFTING (CABG), ON PUMP, TIMES THREE, USING ENDOSCOPICALLY HARVESTED RIGHT GREATER SAPHENOUS VEIN;  Surgeon: Alleen Borne, MD;  Location: [facility] OR;  Service: Open Heart Surgery;  Laterality: N/A;  . CORONARY STENT INTERVENTION N/A 10/03/2020   Procedure: CORONARY STENT INTERVENTION;  Surgeon: Runell Gess, MD;  Location: [facility] INVASIVE CV LAB;  Service: Cardiovascular;  Laterality: N/A;  . CORONARY/GRAFT ACUTE MI REVASCULARIZATION N/A 07/18/2020   Procedure: Coronary/Graft Acute MI Revascularization;  Surgeon: Swaz[place], Peter M, MD;  Location: [facility] INVASIVE CV LAB;  Service: Cardiovascular;  Laterality: N/A;  . INTRAVASCULAR PRESSURE WIRE/FFR STUDY N/A 10/03/2020   Procedure: INTRAVASCULAR PRESSURE WIRE/FFR STUDY;  Surgeon: Runell Gess, MD;  Location: [facility] INVASIVE CV LAB;  Service: Cardiovascular;  Laterality: N/A;  . LEFT HEART CATH AND CORONARY ANGIOGRAPHY N/A 07/18/2020   Procedure: LEFT HEART CATH AND CORONARY ANGIOGRAPHY;  Surgeon: Tonny Bollman, MD;  Location: [facility]  INVASIVE CV LAB;  Service: Cardiovascular;  Laterality: N/A;  . LEFT HEART CATH AND CORS/GRAFTS ANGIOGRAPHY N/A 08/08/2020   Procedure: LEFT HEART CATH AND CORS/GRAFTS ANGIOGRAPHY;  Surgeon: Lennette Bihari, MD;  Location: [facility] INVASIVE CV LAB;  Service: Cardiovascular;  Laterality: N/A;  . LEFT  HEART CATH AND CORS/GRAFTS ANGIOGRAPHY N/A 10/03/2020   Procedure: LEFT HEART CATH AND CORS/GRAFTS ANGIOGRAPHY;  Surgeon: Runell Gess, MD;  Location: [facility] INVASIVE CV LAB;  Service: Cardiovascular;  Laterality: N/A;  . TEE WITHOUT CARDIOVERSION N/A 07/18/2020   Procedure: TRANSESOPHAGEAL ECHOCARDIOGRAM (TEE);  Surgeon: Alleen Borne, MD;  Location: [facility] OR;  Service: Open Heart Surgery;  Laterality: N/A;    Current Medications: No outpatient medications have been marked as taking for the 11/18/20 encounter (Appointment) with Meriam Sprague, MD.     Allergies:   Patient has no known allergies.   Social History   Socioeconomic History  . Marital status: Unknown    Spouse name: Not on file  . Number of children: Not on file  . Years of education: 63  . Highest education level: Not on file  Occupational History  . Not on file  Tobacco Use  . Smoking status: Never Smoker  . Smokeless tobacco: Never Used  Vaping Use  . Vaping Use: Never used  Substance and Sexual Activity  . Alcohol use: Never  . Drug use: Never  . Sexual activity: Not Currently  Other Topics Concern  . Not on file  Social History Narrative  . Not on file   Social Determinants of Health   Financial Resource Strain: Not on file  Food Insecurity: Not on file  Transportation Needs: Not on file  Physical Activity: Not on file  Stress: Not on file  Social Connections: Not on file     Family History: The patient's ***family history is not on file.  ROS:   Please see the history of present illness.    *** All other systems reviewed and are negative.  EKGs/Labs/Other Studies Reviewed:    The following studies were reviewed today: Ultrasound 10/18/20: Summary:  The avascular inhomogeneous structure noted in the right groin has  characteristics of a hematoma, measuring 6.7 cm in length and 4.4 cm in  width.   [place] PSA 10/04/20: A mixed echogenic structure measuring approximately 4.1 cm x 1.8 cm is   visualized at the right groin with ultrasound characteristics of a  hematoma.   ____________________  Left heart cath 10/03/20:  Origin lesion is 100% stenosed.  Ost Cx to Prox Cx lesion is 80% stenosed.  Ost LAD to Mid LAD lesion is 100% stenosed.  Origin to Prox Graft lesion before 1st Diag is 95% stenosed.  Origin to Prox Graft lesion between 1st Diag and 3rd Mrg is 100% stenosed.  A drug-eluting stent was successfully placed using a STENT RESOLUTE ONYX 4.5X22.  Post intervention, there is a 10% residual stenosis.  The left ventricular ejection fraction is 50-55% by visual estimate.  The left ventricular systolic function is normal.  LV end diastolic pressure is normal.  IMPRESSION:Successful PCI drug-eluting stenting of the origin of the diagonal branch SVG with occlusion of the continuation of the diagonal to the obtuse marginal branch. The DFR of the proximal native circumflex was 0.99 suggesting this was not physiologically significant. There was dampening at the origin of the dominant RCA which had been demonstrated in several prior cardiac cath but no obstructive disease was noted. The sheath was secured in place. The patient  was already on aspirin and ticagrelor. The sheath will be removed once ACT falls below 170 pressure held. Patient will be hydrated overnight, discharged home in the morning and follow-up with Dr. Shari Prows.` _____________  Echo 08/08/20: 1. Septal hypokinesis consistent with post-operative state. Left  ventricular ejection fraction, by estimation, is 50 to 55%. The left  ventricle has low normal function. The left ventricle demonstrates  regional wall motion abnormalities (see scoring  diagram/findings for description). Left ventricular diastolic parameters  are consistent with Grade I diastolic dysfunction (impaired relaxation).  2. Right ventricular systolic function is normal. The right ventricular  size is normal.  3. The mitral  valve is normal in structure. Trivial mitral valve  regurgitation. No evidence of mitral stenosis.  4. The aortic valve is tricuspid. Aortic valve regurgitation is not  visualized. No aortic stenosis is present.  5. The inferior vena cava is normal in size with greater than 50%  respiratory variability, suggesting right atrial pressure of 3 mmHg.   Cath 08/09/19:  Prox Cx lesion is 80% stenosed.  Prox LAD to Mid LAD lesion is 100% stenosed.  Mid LAD lesion is 80% stenosed.  Origin to Prox Graft lesion is 100% stenosed.  Ost RCA lesion is 40% stenosed.  There is mild to moderate left ventricular systolic dysfunction.  LV end diastolic pressure is mildly elevated.  Mild LV dysfunction with mild anterolateral hypocontractility, EF estimate at 40% with LVEDP at 20 mmHg.  Significant native CAD with total occlusion of the LAD immediately proximal to the takeoff of the diagonal vessel.  Normal bifurcating ramus intermediate vessel.  Large left circumflex coronary artery with 80% proximal stenosis and competitive filling of a large OM 3 vessel.  Smooth ostial narrowing of the RCA of approximately 30 to 40%. However with catheter engagement there was significant catheter dampening which improved following IC nitroglycerin administration.  Occluded SVG which had supplied the LAD.  Patent sequential SVG supplying the diagonal vessel which then fills the LAD with previously noted 80% stenosis on an angle in the LAD immediately after the diagonal takeoff, and sequential graft supplying a large OM 3 vessel of the left circumflex coronary artery.  RECOMMENDATION: The patient's non-ST segment elevation myocardial infarction most likely is due to acute occlusion of the vein graft which had supplied the LAD. This graft is 40 weeks old. I suspect this occlusion may have contributed by the fact that the LAD is supplied by the diagonal vessel which is grafted and there is TIMI-3 flow  down the LAD system. There continues to be a high-grade 80+ percent stenosis on an angle in the LAD arising from the diagonal vessel which 3 weeks ago was not able to be entered with a wire during attempted acute angioplasty. Since the patient is pain-free and there is a high likelihood of thrombus in the vein graft which has occluded plan initial aggressive medical management. Will initiate DAPT with aspirin/Brilinta. Continue beta-blocker therapy, initiate nitrate therapy, and continue aggressive lipid-lowering therapy. If patient continues to have symptoms, consider addition of ranolazine and possible future intervention to the native LAD after an aggressive medical therapy trial.   Diagnostic Dominance: Right    Intervention   TTE 08/08/20: IMPRESSIONS  1. Septal hypokinesis consistent with post-operative state. Left  ventricular ejection fraction, by estimation, is 50 to 55%. The left  ventricle has low normal function. The left ventricle demonstrates  regional wall motion abnormalities (see scoring  diagram/findings for description). Left ventricular diastolic parameters  are consistent  with Grade I diastolic dysfunction (impaired relaxation).  2. Right ventricular systolic function is normal. The right ventricular  size is normal.  3. The mitral valve is normal in structure. Trivial mitral valve  regurgitation. No evidence of mitral stenosis.  4. The aortic valve is tricuspid. Aortic valve regurgitation is not  visualized. No aortic stenosis is present.  5. The inferior vena cava is normal in size with greater than 50%  respiratory variability, suggesting right atrial pressure of 3 mmHg.   Cath 07/18/20:   1st Diag lesion is 90% stenosed.  Prox Cx lesion is 75% stenosed.  Prox LAD-1 lesion is 100% stenosed.  Prox LAD-2 lesion is 95% stenosed with 95% stenosed side branch in 1st Diag.  Post intervention, there is a 95% residual stenosis.  Post  intervention, there is a 95% residual stenosis.  Post intervention, the side branch was reduced to 95% residual stenosis.  1. Acute occlusion of the proximal LAD at site of very complex bifurcation lesion. Successful restoration of antegrade flow with POBA into the first diagonal branch. Unable to cross the lesion in the LAD distal to the diagonal with a wire.   Plan: emergent CABG.   Diagnostic Dominance: Right    Intervention      TTE 07/17/20: IMPRESSIONS  1. Left ventricular ejection fraction, by estimation, is 55 to 60%. The  left ventricle has normal function. The left ventricle has no regional  wall motion abnormalities. There is moderate asymmetric left ventricular  hypertrophy. Left ventricular  diastolic parameters are consistent with Grade I diastolic dysfunction  (impaired relaxation).  2. Right ventricular systolic function is normal. The right ventricular  size is normal.  3. The mitral valve is grossly normal. No evidence of mitral valve  regurgitation.  4. The aortic valve was not well visualized. Aortic valve regurgitation  is not visualized. No aortic stenosis is present.   Comparison(s): No prior Echocardiogram.   Conclusion(s)/Recommendation(s): Normal biventricular function without  evidence of hemodynamically significant valvular heart disease.   FINDINGS  Left Ventricle: Left ventricular ejection fraction, by estimation, is 55  to 60%. The left ventricle has normal function. The left ventricle has no  regional wall motion abnormalities. The left ventricular internal cavity  size was small. There is moderate  asymmetric left ventricular hypertrophy. Left ventricular diastolic  parameters are consistent with Grade I diastolic dysfunction (impaired  relaxation).   Right Ventricle: The right ventricular size is normal. No increase in  right ventricular wall thickness. Right ventricular systolic function is  normal.   Left Atrium:  Left atrial size was normal in size.   Right Atrium: Right atrial size was normal in size.   Pericardium: There is no evidence of pericardial effusion.   Mitral Valve: The mitral valve is grossly normal. No evidence of mitral  valve regurgitation.   Tricuspid Valve: The tricuspid valve is grossly normal. Tricuspid valve  regurgitation is not demonstrated.   Aortic Valve: The aortic valve was not well visualized. Aortic valve  regurgitation is not visualized. No aortic stenosis is present.   Pulmonic Valve: The pulmonic valve was grossly normal. Pulmonic valve  regurgitation is not visualized.   Aorta: The aortic root and ascending aorta are structurally normal, with  no evidence of dilitation.   IAS/Shunts: The atrial septum is grossly normal.   07/18/20: CABG Op note: Procedure:  1. EmergentMedian Sternotomy 2. Extracorporeal circulation 3. Coronary artery bypass grafting x 3   Saphenous veingraft to the LAD  Sequential SVG to  diagonal and OM.   4. Endoscopic vein harvest from the rightleg  EKG:  EKG is *** ordered today.  The ekg ordered today demonstrates ***  Recent Labs: 07/16/2020: ALT 30 07/17/2020: TSH 2.699 07/19/2020: Magnesium 2.3 10/05/2020: BUN 12; Creatinine, Ser 0.95; Potassium 3.8; Sodium 139 10/18/2020: Hemoglobin 12.6; Platelets 355  Recent Lipid Panel    Component Value Date/Time   CHOL 254 (H) 07/17/2020 1015   TRIG 153 (H) 07/17/2020 1015   HDL 40 (L) 07/17/2020 1015   CHOLHDL 6.4 07/17/2020 1015   VLDL 31 07/17/2020 1015   LDLCALC 183 (H) 07/17/2020 1015     Risk Assessment/Calculations:   {Does this patient have ATRIAL FIBRILLATION?:[id number]}   Physical Exam:    VS:  There were no vitals taken for this visit.    Wt Readings from Last 3 Encounters:  11/01/20 138 lb (62.6 kg)  10/20/20 140 lb 6.4 oz (63.7 kg)  10/05/20 148 lb 5.9 oz (67.3 kg)     GEN: *** Well nourished, well developed in no acute distress HEENT:  Normal NECK: No JVD; No carotid bruits LYMPHATICS: No lymphadenopathy CARDIAC: ***RRR, no murmurs, rubs, gallops RESPIRATORY:  Clear to auscultation without rales, wheezing or rhonchi  ABDOMEN: Soft, non-tender, non-distended MUSCULOSKELETAL:  No edema; No deformity  SKIN: Warm and dry NEUROLOGIC:  Alert and oriented x 3 PSYCHIATRIC:  Normal affect   ASSESSMENT:    No diagnosis found. PLAN:    In order of problems listed above:  #NSTEMIsecondary to newly occluded SVG-to-LAD graft #Multivessel CAD s/p CABG on 07/18/20 withSVG to LAD, sequential SVG to Diag and OM Patient withinitialadmission forNSTEMI found to have multivessel CAD on cath on 12/12/21prompting emergent CABG on 07/18/20 as detailed above. LIMA deemed not a suitable bypass conduit due to small size. TTE on 07/17/20 with preserved LVEF 55-60%, no WMA, normal RV function. Post-operatively, she required inotropic support initially but was weaned off and was able to go home on 07/25/20. She was doing well until01/02when she developed acute onset back pain radiating to her chest. She presented to the ED where trop up-trended from 30-->500 and ECG with new TWI in the lateral leads (chronic q waves in V1-V3). Underwent coronary angiography which revealed occluded SVG-LAD graft. Suspect occlusion may have contributed by the fact that the LAD is supplied by the diagonal vessel which is grafted and there is TIMI-3 flow down the LAD system.Given concern for significant thrombus in the SVG-LAD graft and TIMI 3 flow down the LAD from SVG-Diag graft, the decision was made to pursue medical management. She was doing well until 09/29/20 when she developed progressive angina that required readmission on 10/02/20. Underwent coronary angiography on 10/03/20 where the SVG-D1 graft was 95% stenosed proximally and the jump graft to the OM3 was 100% occluded. She ultimately underwent PCI to the SVG-D1. Now presenting for follow-up. -Continue  aggressive medical management with ASA, brilintafor at least 12 months -Did not tolerate imdur due to hypotension and no active chest pain; can consider ranexa if angina returns -Continue atorvastatin  daily -Continue metop  XL daily -Hold cardiac rehab until Monday of next week to allow groin hematoma to heal  #Right groin hematoma Recent cath procedure complicated by right groin hematoma. Pressure was held. CT demonstrated right thigh and groin hematoma and small pseudoaneurysm.Repet ultrasound with residual hematoma but no pseudoaneurysm. CBC improved to 12. -No heavy lifting or vigorous exercise for 7 days; can resume cardiac rehab on 10/24/20  #HLD: -Continue atorvastatin  daily  #Left  Wrist Pain: -Referred to Dr. Katrinka Blazing with sports medicine     {Are you ordering a CV Procedure (e.g. stress test, cath, DCCV, TEE, etc)?   Press F2        :[id number]}    Medication Adjustments/Labs and Tests Ordered: Current medicines are reviewed at length with the patient today.  Concerns regarding medicines are outlined above.  No orders of the defined types were placed in this encounter.  No orders of the defined types were placed in this encounter.   There are no Patient Instructions on file for this visit.   Signed, Meriam Sprague, MD  11/12/2020 1:34 PM    [facility]

## 2020-11-14 ENCOUNTER — Encounter (HOSPITAL_COMMUNITY)
Admission: RE | Admit: 2020-11-14 | Discharge: 2020-11-14 | Disposition: A | Discharge status: Home / Self Care | Payer: Medicare HMO | Source: Ambulatory Visit | Attending: Cardiology | Admitting: Cardiology

## 2020-11-14 ENCOUNTER — Other Ambulatory Visit: Payer: Self-pay

## 2020-11-14 DIAGNOSIS — I214 Non-ST elevation (NSTEMI) myocardial infarction: Secondary | ICD-10-CM

## 2020-11-16 ENCOUNTER — Other Ambulatory Visit: Payer: Self-pay

## 2020-11-16 ENCOUNTER — Encounter (HOSPITAL_COMMUNITY)
Admission: RE | Admit: 2020-11-16 | Discharge: 2020-11-16 | Disposition: A | Discharge status: Home / Self Care | Payer: Medicare HMO | Source: Ambulatory Visit | Attending: Cardiology | Admitting: Cardiology

## 2020-11-16 DIAGNOSIS — I214 Non-ST elevation (NSTEMI) myocardial infarction: Secondary | ICD-10-CM | POA: Diagnosis not present

## 2020-11-18 ENCOUNTER — Other Ambulatory Visit: Payer: Self-pay

## 2020-11-18 ENCOUNTER — Ambulatory Visit: Payer: Medicare HMO | Admitting: Cardiology

## 2020-11-18 ENCOUNTER — Encounter (HOSPITAL_COMMUNITY)
Admission: RE | Admit: 2020-11-18 | Discharge: 2020-11-18 | Disposition: A | Discharge status: Home / Self Care | Payer: Medicare HMO | Source: Ambulatory Visit | Attending: Cardiology | Admitting: Cardiology

## 2020-11-18 ENCOUNTER — Encounter: Payer: Self-pay | Admitting: Cardiology

## 2020-11-18 VITALS — BP 118/78 | HR 71 | Ht 59.0 in | Wt 139.6 lb

## 2020-11-18 DIAGNOSIS — Z951 Presence of aortocoronary bypass graft: Secondary | ICD-10-CM | POA: Diagnosis not present

## 2020-11-18 DIAGNOSIS — I214 Non-ST elevation (NSTEMI) myocardial infarction: Secondary | ICD-10-CM

## 2020-11-18 DIAGNOSIS — I2510 Atherosclerotic heart disease of native coronary artery without angina pectoris: Secondary | ICD-10-CM | POA: Diagnosis not present

## 2020-11-18 DIAGNOSIS — I729 Aneurysm of unspecified site: Secondary | ICD-10-CM

## 2020-11-18 DIAGNOSIS — M25532 Pain in left wrist: Secondary | ICD-10-CM

## 2020-11-18 DIAGNOSIS — T81718A Complication of other artery following a procedure, not elsewhere classified, initial encounter: Secondary | ICD-10-CM | POA: Diagnosis not present

## 2020-11-18 LAB — LIPID PANEL
Chol/HDL Ratio: 3.4 ratio (ref 0.0–4.4)
Cholesterol, Total: 131 mg/dL (ref 100–199)
HDL: 38 mg/dL — ABNORMAL LOW (ref >39)
LDL Chol Calc (NIH): 67 mg/dL (ref 0–99)
Triglycerides: 153 mg/dL — ABNORMAL HIGH (ref 0–149)
VLDL Cholesterol Cal: 26 mg/dL (ref 5–40)

## 2020-11-18 LAB — HEPATIC FUNCTION PANEL
ALT: 27 IU/L (ref 0–32)
AST: 23 IU/L (ref 0–40)
Albumin: 4.1 g/dL (ref 3.7–4.7)
Alkaline Phosphatase: 129 IU/L — ABNORMAL HIGH (ref 44–121)
Bilirubin Total: 0.3 mg/dL (ref 0.0–1.2)
Bilirubin, Direct: <0.1 mg/dL (ref 0.00–0.40)
Total Protein: 7 g/dL (ref 6.0–8.5)

## 2020-11-18 NOTE — Patient Instructions (Signed)
Medication Instructions:  Your physician recommends that you continue on your current medications as directed. Please refer to the Current Medication list given to you today.  *If you need a refill on your cardiac medications before your next appointment, please call your pharmacy*   Lab Work: Lipid and Liver today  If you have labs (blood work) drawn today and your tests are completely normal, you will receive your results only by: Marland Kitchen MyChart Message (if you have MyChart) OR . A paper copy in the mail If you have any lab test that is abnormal or we need to change your treatment, we will call you to review the results.   Testing/Procedures: None   Follow-Up: At [facility], you and your health needs are our priority.  As part of our continuing mission to provide you with exceptional heart care, we have created designated Provider Care Teams.  These Care Teams include your primary Cardiologist (physician) and Advanced Practice Providers (APPs -  Physician Assistants and Nurse Practitioners) who all work together to provide you with the care you need, when you need it.  We recommend signing up for the patient portal called "MyChart".  Sign up information is provided on this After Visit Summary.  MyChart is used to connect with patients for Virtual Visits (Telemedicine).  Patients are able to view lab/test results, encounter notes, upcoming appointments, etc.  Non-urgent messages can be sent to your provider as well.   To learn more about what you can do with MyChart, go to [email or web address].    Your next appointment:   3 month(s)  The format for your next appointment:   In Person  Provider:   You may see Meriam Sprague, MD or one of the following Advanced Practice Providers on your designated Care Team:    Tereso Newcomer, PA-C  Chelsea Aus, [place]    Other Instructions

## 2020-11-18 NOTE — Progress Notes (Signed)
Cardiology Office Note:    Date:  11/18/2020   ID:  [name], DOB June 07, 1946, MRN [id number]  PCP:  Patient, No Pcp Per (Inactive)   [facility]  Cardiologist:  Meriam Sprague, MD  Advanced Practice Provider:  No care team member to display Electrophysiologist:  None   Referring MD: No ref. provider found     History of Present Illness:    Brenda Wilkins is a 75 y.o. female with a hx of with a hx of known CAD s/p CABG 08/05/21 with subsequent NSTEMIs (08/07/20 and 09/29/20) found to have multiple occluded grafts s/p PCI to SVG-Diag as detailed below and HLD who presents to clinic for follow-up.  The patient suffered from anNSTEMI in early December found to have multivessel CAD on cath 07/17/20 s/p 3V CABG on 07/18/20(SVG to LAD, sequential SVG to Diag and OM).   Post-operative course was complicated by recurrent chest pain causing her tore-presentto theEDon 08/07/20. In the ED, she was found tohave elevated troponin consistent with NSTEMI. She underwent repeat cath, whichrevealedoccluded SVG-LAD graft but TIMI 3 flow down LAD from SVG-Diag graft.Case was discussed with CV surgery and given brisk TIMI 3 flow down the LAD from the other bypass graft, the decision was made to manage medically.  She was doing well until 09/29/20 where she developed progressive chest pressure. She presented to [facility] on 10[facility]20 and underwent coronary angiography where she had 95% stenosis of the graft before the 1st diag and 100% stenosis of the origin of the graft between the first Diag and OM2. A DES was placed at the origin of the diagonal branch SVG. Her post-cath course was complicated by a right groin hematoma and pseudoaneurysm. She was managed conservatively.  Called the office prior to her visit on 10/17/20 as she noted increased swelling in her groin with a palpable firm mass at prior groin access site. CBC was obtained which showed hemoglobin 12.6 which was  improved from 10.3 on discharge. Ultrasound consistent with hematoma but no evidence of pseudoaneurysm. During her visit, she was stable and tolerating her medications. Groin site with sizable firm hematoma which she had been monitoring and was not increasing.   Today, the patient states she is doing much better and feels like she is getting back to "normal." Groin hematoma resolved. She has resumed walking and participated in cardiac rehab which is going well. Tolerating her medications She does complain of having an episode of feeling a pulsation in the left side of her ear so she took her blood pressure and the readings were at her baseline. She questions if this is normal. She request to have her cholesterol checked today as well. She denies having any lower extremity edema, exertional chest pain, tightness, or pressure, PND, or orthopnea.   Past Medical History:  Diagnosis Date  . Coronary artery disease   . Hyperlipidemia   . Hypertension   . NSTEMI (non-ST elevated myocardial infarction) (HCC) 07/16/2020  . 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 CARDIAC CATHETERIZATION  08/08/2020  . CORONARY ARTERY BYPASS GRAFT N/A 07/18/2020   Procedure: CORONARY ARTERY BYPASS GRAFTING (CABG), ON PUMP, TIMES THREE, USING ENDOSCOPICALLY HARVESTED RIGHT GREATER SAPHENOUS VEIN;  Surgeon: Alleen Borne, MD;  Location: [facility] OR;  Service: Open Heart Surgery;  Laterality: N/A;  . CORONARY STENT INTERVENTION N/A 10/03/2020   Procedure: CORONARY STENT INTERVENTION;  Surgeon: Runell Gess, MD;  Location: [facility] INVASIVE CV  LAB;  Service: Cardiovascular;  Laterality: N/A;  . CORONARY/GRAFT ACUTE MI REVASCULARIZATION N/A 07/18/2020   Procedure: Coronary/Graft Acute MI Revascularization;  Surgeon: SwazilandJordan, Peter M, MD;  [facility];  Service: Cardiovascular;  Laterality: N/A;  . INTRAVASCULAR PRESSURE WIRE/FFR STUDY N/A 10/03/2020   Procedure: INTRAVASCULAR  PRESSURE WIRE/FFR STUDY;  Surgeon: Runell GessBerry, Jonathan J, MD;  Location: [facility] INVASIVE CV LAB;  Service: Cardiovascular;  Laterality: N/A;  . LEFT HEART CATH AND CORONARY ANGIOGRAPHY N/A 07/18/2020   Procedure: LEFT HEART CATH AND CORONARY ANGIOGRAPHY;  Surgeon: Tonny Bollmanooper, Michael, MD;  [facility];  Service: Cardiovascular;  Laterality: N/A;  . LEFT HEART CATH AND CORS/GRAFTS ANGIOGRAPHY N/A 08/08/2020   Procedure: LEFT HEART CATH AND CORS/GRAFTS ANGIOGRAPHY;  Surgeon: Lennette BihariKelly, Thomas A, MD;  Location: [facility] INVASIVE CV LAB;  Service: Cardiovascular;  Laterality: N/A;  . LEFT HEART CATH AND CORS/GRAFTS ANGIOGRAPHY N/A 10/03/2020   Procedure: LEFT HEART CATH AND CORS/GRAFTS ANGIOGRAPHY;  Surgeon: Runell GessBerry, Jonathan J, MD;  Location: [facility] INVASIVE CV LAB;  Service: Cardiovascular;  Laterality: N/A;  . TEE WITHOUT CARDIOVERSION N/A 07/18/2020   Procedure: TRANSESOPHAGEAL ECHOCARDIOGRAM (TEE);  Surgeon: Alleen BorneBartle, Bryan K, MD;  [facility];  Service: Open Heart Surgery;  Laterality: N/A;    Current Medications: Current Meds  Medication Sig  . acetaminophen (TYLENOL) 325 MG tablet Take 325-650 mg by mouth every 6 (six) hours as needed for mild pain (or headaches).  Marland Kitchen. aspirin 81 MG EC tablet Take 1 tablet (81 mg total) by mouth daily. Swallow whole.  Marland Kitchen. atorvastatin (LIPITOR) 80 MG tablet Take 1 tablet (80 mg total) by mouth daily.  . Cholecalciferol (VITAMIN D) 50 MCG (2000 UT) CAPS Take 1 capsule by mouth daily.  . colchicine 0.6 MG tablet Take 1.2mg  (2 tablets) for the first dose and the 0.6mg (1 tablet) one hour later.  . ferrous fumarate-b12-vitamic C-folic acid (FEROCON) capsule Take 1 capsule by mouth 2 (two) times daily.  . metoprolol succinate (TOPROL-XL) 25 MG 24 hr tablet Take 0.5 tablets (12.5 mg total) by mouth daily.  . nitroGLYCERIN (NITROSTAT) 0.4 MG SL tablet PLACE 1 TABLET (0.4 MG TOTAL) UNDER THE TONGUE EVERY FIVE MINUTES X 3 DOSES AS NEEDED FOR CHEST PAIN.  Marland Kitchen. TART CHERRY PO Take 1  capsule by mouth daily.  . ticagrelor (BRILINTA) 90 MG TABS tablet Take 1 tablet (90 mg total) by mouth 2 (two) times daily.     Allergies:   Patient has no known allergies.   Social History   Socioeconomic History  . Marital status: Unknown    Spouse name: Not on file  . Number of children: Not on file  . Years of education: 7912  . Highest education level: Not on file  Occupational History  . Not on file  Tobacco Use  . Smoking status: Never Smoker  . Smokeless tobacco: Never Used  Vaping Use  . Vaping Use: Never used  Substance and Sexual Activity  . Alcohol use: Never  . Drug use: Never  . Sexual activity: Not Currently  Other Topics Concern  . Not on file  Social History Narrative  . Not on file   Social Determinants of Health   Financial Resource Strain: Not on file  Food Insecurity: Not on file  Transportation Needs: Not on file  Physical Activity: Not on file  Stress: Not on file  Social Connections: Not on file     Family History: The patient's family history is not on file.  ROS:  Please see the history of present illness. All other systems reviewed and are negative. Review of Systems  Constitutional: Negative for unexpected weight change.  HENT: Negative for hearing loss and rhinorrhea.   Eyes: Negative for visual disturbance.  Respiratory: Negative for cough.   Cardiovascular: Negative for chest pain and leg swelling.  Gastrointestinal: Negative for nausea, vomiting, diarrhea and blood in stool.  Genitourinary: Negative for dysuria and frequency.  Musculoskeletal: Negative for myalgias and arthralgias.  Skin: Negative for rash.  Neurological: Negative for headaches.  Hematological: Negative for adenopathy.  Psychiatric/Behavioral: Denies depression or anxiety    EKGs/Labs/Other Studies Reviewed:    The following studies were reviewed today: Ultrasound 10/18/20: Summary:  The avascular inhomogeneous structure noted in the right groin has   characteristics of a hematoma, measuring 6.7 cm in length and 4.4 cm in  width.   [place] PSA 10/04/20: A mixed echogenic structure measuring approximately 4.1 cm x 1.8 cm is  visualized at the right groin with ultrasound characteristics of a  hematoma.   ____________________  Left heart cath 10/03/20:  Origin lesion is 100% stenosed.  Ost Cx to Prox Cx lesion is 80% stenosed.  Ost LAD to Mid LAD lesion is 100% stenosed.  Origin to Prox Graft lesion before 1st Diag is 95% stenosed.  Origin to Prox Graft lesion between 1st Diag and 3rd Mrg is 100% stenosed.  A drug-eluting stent was successfully placed using a STENT RESOLUTE ONYX 4.5X22.  Post intervention, there is a 10% residual stenosis.  The left ventricular ejection fraction is 50-55% by visual estimate.  The left ventricular systolic function is normal.  LV end diastolic pressure is normal.  IMPRESSION:Successful PCI drug-eluting stenting of the origin of the diagonal branch SVG with occlusion of the continuation of the diagonal to the obtuse marginal branch. The DFR of the proximal native circumflex was 0.99 suggesting this was not physiologically significant. There was dampening at the origin of the dominant RCA which had been demonstrated in several prior cardiac cath but no obstructive disease was noted. The sheath was secured in place. The patient was already on aspirin and ticagrelor. The sheath will be removed once ACT falls below 170 pressure held. Patient will be hydrated overnight, discharged home in the morning and follow-up with Dr. Shari Prows.` _____________  Echo 08/08/20: 1. Septal hypokinesis consistent with post-operative state. Left  ventricular ejection fraction, by estimation, is 50 to 55%. The left  ventricle has low normal function. The left ventricle demonstrates  regional wall motion abnormalities (see scoring  diagram/findings for description). Left ventricular diastolic parameters  are  consistent with Grade I diastolic dysfunction (impaired relaxation).  2. Right ventricular systolic function is normal. The right ventricular  size is normal.  3. The mitral valve is normal in structure. Trivial mitral valve  regurgitation. No evidence of mitral stenosis.  4. The aortic valve is tricuspid. Aortic valve regurgitation is not  visualized. No aortic stenosis is present.  5. The inferior vena cava is normal in size with greater than 50%  respiratory variability, suggesting right atrial pressure of 3 mmHg.   Cath 08/09/19:  Prox Cx lesion is 80% stenosed.  Prox LAD to Mid LAD lesion is 100% stenosed.  Mid LAD lesion is 80% stenosed.  Origin to Prox Graft lesion is 100% stenosed.  Ost RCA lesion is 40% stenosed.  There is mild to moderate left ventricular systolic dysfunction.  LV end diastolic pressure is mildly elevated.  Mild LV dysfunction with mild anterolateral hypocontractility, EF  estimate at 40% with LVEDP at 20 mmHg.  Significant native CAD with total occlusion of the LAD immediately proximal to the takeoff of the diagonal vessel.  Normal bifurcating ramus intermediate vessel.  Large left circumflex coronary artery with 80% proximal stenosis and competitive filling of a large OM 3 vessel.  Smooth ostial narrowing of the RCA of approximately 30 to 40%. However with catheter engagement there was significant catheter dampening which improved following IC nitroglycerin administration.  Occluded SVG which had supplied the LAD.  Patent sequential SVG supplying the diagonal vessel which then fills the LAD with previously noted 80% stenosis on an angle in the LAD immediately after the diagonal takeoff, and sequential graft supplying a large OM 3 vessel of the left circumflex coronary artery.  RECOMMENDATION: The patient's non-ST segment elevation myocardial infarction most likely is due to acute occlusion of the vein graft which had supplied the LAD.  This graft is 24 weeks old. I suspect this occlusion may have contributed by the fact that the LAD is supplied by the diagonal vessel which is grafted and there is TIMI-3 flow down the LAD system. There continues to be a high-grade 80+ percent stenosis on an angle in the LAD arising from the diagonal vessel which 3 weeks ago was not able to be entered with a wire during attempted acute angioplasty. Since the patient is pain-free and there is a high likelihood of thrombus in the vein graft which has occluded plan initial aggressive medical management. Will initiate DAPT with aspirin/Brilinta. Continue beta-blocker therapy, initiate nitrate therapy, and continue aggressive lipid-lowering therapy. If patient continues to have symptoms, consider addition of ranolazine and possible future intervention to the native LAD after an aggressive medical therapy trial.   Diagnostic Dominance: Right    Intervention   TTE 08/08/20: IMPRESSIONS  1. Septal hypokinesis consistent with post-operative state. Left  ventricular ejection fraction, by estimation, is 50 to 55%. The left  ventricle has low normal function. The left ventricle demonstrates  regional wall motion abnormalities (see scoring  diagram/findings for description). Left ventricular diastolic parameters  are consistent with Grade I diastolic dysfunction (impaired relaxation).  2. Right ventricular systolic function is normal. The right ventricular  size is normal.  3. The mitral valve is normal in structure. Trivial mitral valve  regurgitation. No evidence of mitral stenosis.  4. The aortic valve is tricuspid. Aortic valve regurgitation is not  visualized. No aortic stenosis is present.  5. The inferior vena cava is normal in size with greater than 50%  respiratory variability, suggesting right atrial pressure of 3 mmHg.   Cath 07/18/20:   1st Diag lesion is 90% stenosed.  Prox Cx lesion is 75% stenosed.  Prox LAD-1  lesion is 100% stenosed.  Prox LAD-2 lesion is 95% stenosed with 95% stenosed side branch in 1st Diag.  Post intervention, there is a 95% residual stenosis.  Post intervention, there is a 95% residual stenosis.  Post intervention, the side branch was reduced to 95% residual stenosis.  1. Acute occlusion of the proximal LAD at site of very complex bifurcation lesion. Successful restoration of antegrade flow with POBA into the first diagonal branch. Unable to cross the lesion in the LAD distal to the diagonal with a wire.   Plan: emergent CABG.   Diagnostic Dominance: Right    Intervention      TTE 07/17/20: IMPRESSIONS  1. Left ventricular ejection fraction, by estimation, is 55 to 60%. The  left ventricle has normal function. The left  ventricle has no regional  wall motion abnormalities. There is moderate asymmetric left ventricular  hypertrophy. Left ventricular  diastolic parameters are consistent with Grade I diastolic dysfunction  (impaired relaxation).  2. Right ventricular systolic function is normal. The right ventricular  size is normal.  3. The mitral valve is grossly normal. No evidence of mitral valve  regurgitation.  4. The aortic valve was not well visualized. Aortic valve regurgitation  is not visualized. No aortic stenosis is present.   Comparison(s): No prior Echocardiogram.   Conclusion(s)/Recommendation(s): Normal biventricular function without  evidence of hemodynamically significant valvular heart disease.   FINDINGS  Left Ventricle: Left ventricular ejection fraction, by estimation, is 55  to 60%. The left ventricle has normal function. The left ventricle has no  regional wall motion abnormalities. The left ventricular internal cavity  size was small. There is moderate  asymmetric left ventricular hypertrophy. Left ventricular diastolic  parameters are consistent with Grade I diastolic dysfunction (impaired  relaxation).    Right Ventricle: The right ventricular size is normal. No increase in  right ventricular wall thickness. Right ventricular systolic function is  normal.   Left Atrium: Left atrial size was normal in size.   Right Atrium: Right atrial size was normal in size.   Pericardium: There is no evidence of pericardial effusion.   Mitral Valve: The mitral valve is grossly normal. No evidence of mitral  valve regurgitation.   Tricuspid Valve: The tricuspid valve is grossly normal. Tricuspid valve  regurgitation is not demonstrated.   Aortic Valve: The aortic valve was not well visualized. Aortic valve  regurgitation is not visualized. No aortic stenosis is present.   Pulmonic Valve: The pulmonic valve was grossly normal. Pulmonic valve  regurgitation is not visualized.   Aorta: The aortic root and ascending aorta are structurally normal, with  no evidence of dilitation.   IAS/Shunts: The atrial septum is grossly normal.   07/18/20: CABG Op note: Procedure:  1. EmergentMedian Sternotomy 2. Extracorporeal circulation 3. Coronary artery bypass grafting x 3   Saphenous veingraft to the LAD  Sequential SVG to diagonal and OM.   4. Endoscopic vein harvest from the rightleg  EKG:  EKG was not ordered today.    Recent Labs: 07/16/2020: ALT 30 07/17/2020: TSH 2.699 07/19/2020: Magnesium 2.3 10/05/2020: BUN 12; Creatinine, Ser 0.95; Potassium 3.8; Sodium 139 10/18/2020: Hemoglobin 12.6; Platelets 355  Recent Lipid Panel    Component Value Date/Time   CHOL 254 (H) 07/17/2020 1015   TRIG 153 (H) 07/17/2020 1015   HDL 40 (L) 07/17/2020 1015   CHOLHDL 6.4 07/17/2020 1015   VLDL 31 07/17/2020 1015   LDLCALC 183 (H) 07/17/2020 1015      Physical Exam:    VS:  BP 118/78   Pulse 71   Ht  (1.499 m)   Wt 139 lb 9.6 oz (63.3 kg)   SpO2 98%   BMI 28.20 kg/m     Wt Readings from Last 3 Encounters:  11/18/20 139 lb 9.6 oz (63.3 kg)  11/01/20 138 lb (62.6 kg)   10/20/20 140 lb 6.4 oz (63.7 kg)     GEN: Well nourished, well developed in no acute distress HEENT: Normal NECK: No JVD; No carotid bruits LYMPHATICS: No lymphadenopathy CARDIAC: RRR, no murmurs, rubs, gallops RESPIRATORY:  Clear to auscultation without rales, wheezing or rhonchi  ABDOMEN: Soft, non-tender, non-distended MUSCULOSKELETAL:  No edema; No deformity  SKIN: Warm and dry NEUROLOGIC:  Alert and oriented x 3 PSYCHIATRIC:  Normal  affect   ASSESSMENT:    1. Coronary artery disease involving native coronary artery of native heart without angina pectoris   2. NSTEMI (non-ST elevated myocardial infarction) (HCC)   3. Pseudoaneurysm following procedure (HCC)   4. S/P CABG x 3   5. Left wrist pain    PLAN:    In order of problems listed above:  #NSTEMIsecondary to newly occluded SVG-to-LAD graft #Multivessel CAD s/p CABG on 07/18/20 withSVG to LAD, sequential SVG to Diag and OM Patient withinitialadmission forNSTEMI found to have multivessel CAD on cath on 12/12/21prompting emergent CABG on 07/18/20 as detailed above. LIMA deemed not a suitable bypass conduit due to small size. TTE on 07/17/20 with preserved LVEF 55-60%, no WMA, normal RV function. Post-operatively, she required inotropic support initially but was weaned off and was able to go home on 07/25/20. She was doing well until01/02when she developed acute onset back pain radiating to her chest. She presented to the ED where trop up-trended from 30-->500 and ECG with new TWI in the lateral leads (chronic q waves in V1-V3). Underwent coronary angiography which revealed occluded SVG-LAD graft. Suspect occlusion may have contributed by the fact that the LAD is supplied by the diagonal vessel which is grafted and there is TIMI-3 flow down the LAD system.Given concern for significant thrombus in the SVG-LAD graft and TIMI 3 flow down the LAD from SVG-Diag graft, the decision was made to pursue medical management. She was  doing well until 09/29/20 when she developed progressive angina that required readmission on 10/02/20. Underwent coronary angiography on 10/03/20 where the SVG-D1 graft was 95% stenosed proximally and the jump graft to the OM3 was 100% occluded. She ultimately underwent PCI to the SVG-D1. Now presenting for follow-up. -Continue aggressive medical management with ASA, brilintafor at least 12 months -Did not tolerate imdur due to hypotension and no active chest pain; can consider ranexa if angina returns -Continue atorvastatin  daily -Continue metop  XL daily -Continue cardiac rehab  #Right groin hematoma Recent cath procedure complicated by right groin hematoma. Pressure was held. CT demonstrated right thigh and groin hematoma and small pseudoaneurysm.Repet ultrasound with residual hematoma but no pseudoaneurysm. CBC improved to 12. Now resolved. -Resolved with no further symptoms  #HLD: -Continue atorvastatin  daily -Check lipids and LFTs today  #Left Wrist Pain: -Referred to Dr. Katrinka Blazing with sports medicine; pursuing conservative management of arthritis and cyst   Medication Adjustments/Labs and Tests Ordered: Current medicines are reviewed at length with the patient today.  Concerns regarding medicines are outlined above.  Orders Placed This Encounter  Procedures  . Lipid panel  . Hepatic function panel   No orders of the defined types were placed in this encounter.   Patient Instructions  Medication Instructions:  Your physician recommends that you continue on your current medications as directed. Please refer to the Current Medication list given to you today.  *If you need a refill on your cardiac medications before your next appointment, please call your pharmacy*   Lab Work: Lipid and Liver today  If you have labs (blood work) drawn today and your tests are completely normal, you will receive your results only by: Marland Kitchen MyChart Message (if you have MyChart)  OR . A paper copy in the mail If you have any lab test that is abnormal or we need to change your treatment, we will call you to review the results.   Testing/Procedures: None   Follow-Up: At [facility], you and your health needs are our  priority.  As part of our continuing mission to provide you with exceptional heart care, we have created designated Provider Care Teams.  These Care Teams include your primary Cardiologist (physician) and Advanced Practice Providers (APPs -  Physician Assistants and Nurse Practitioners) who all work together to provide you with the care you need, when you need it.  We recommend signing up for the patient portal called "MyChart".  Sign up information is provided on this After Visit Summary.  MyChart is used to connect with patients for Virtual Visits (Telemedicine).  Patients are able to view lab/test results, encounter notes, upcoming appointments, etc.  Non-urgent messages can be sent to your provider as well.   To learn more about what you can do with MyChart, go to [email or web address].    Your next appointment:   3 month(s)  The format for your next appointment:   In Person  Provider:   You may see Meriam Sprague, MD or one of the following Advanced Practice Providers on your designated Care Team:    Tereso Newcomer, PA-C  Vin Simpson, [place]    Other Instructions      Follow in 3 months   I,Alexis Bryant,acting as a scribe for Meriam Sprague, MD.,have documented all relevant documentation on the behalf of Meriam Sprague, MD,as directed by  Meriam Sprague, MD while in the presence of Meriam Sprague, MD.  I, Meriam Sprague, MD, have reviewed all documentation for this visit. The documentation on 11/18/20 for the exam, diagnosis, procedures, and orders are all accurate and complete.  Signed, Meriam Sprague, MD  11/18/2020 8:58 AM    [facility]

## 2020-11-21 ENCOUNTER — Encounter (HOSPITAL_COMMUNITY)
Admission: RE | Admit: 2020-11-21 | Discharge: 2020-11-21 | Disposition: A | Discharge status: Home / Self Care | Payer: Medicare HMO | Source: Ambulatory Visit | Attending: Cardiology | Admitting: Cardiology

## 2020-11-21 ENCOUNTER — Other Ambulatory Visit: Payer: Self-pay

## 2020-11-21 DIAGNOSIS — I214 Non-ST elevation (NSTEMI) myocardial infarction: Secondary | ICD-10-CM

## 2020-11-23 ENCOUNTER — Other Ambulatory Visit: Payer: Self-pay

## 2020-11-23 ENCOUNTER — Encounter (HOSPITAL_COMMUNITY)
Admission: RE | Admit: 2020-11-23 | Discharge: 2020-11-23 | Disposition: A | Discharge status: Home / Self Care | Payer: Medicare HMO | Source: Ambulatory Visit | Attending: Cardiology | Admitting: Cardiology

## 2020-11-23 DIAGNOSIS — Z0100 Encounter for examination of eyes and vision without abnormal findings: Secondary | ICD-10-CM | POA: Diagnosis not present

## 2020-11-23 DIAGNOSIS — I214 Non-ST elevation (NSTEMI) myocardial infarction: Secondary | ICD-10-CM

## 2020-11-25 ENCOUNTER — Encounter (HOSPITAL_COMMUNITY)
Admission: RE | Admit: 2020-11-25 | Discharge: 2020-11-25 | Disposition: A | Discharge status: Home / Self Care | Payer: Medicare HMO | Source: Ambulatory Visit | Attending: Cardiology | Admitting: Cardiology

## 2020-11-25 ENCOUNTER — Other Ambulatory Visit: Payer: Self-pay

## 2020-11-25 DIAGNOSIS — I214 Non-ST elevation (NSTEMI) myocardial infarction: Secondary | ICD-10-CM | POA: Diagnosis not present

## 2020-11-28 ENCOUNTER — Other Ambulatory Visit: Payer: Self-pay

## 2020-11-28 ENCOUNTER — Encounter (HOSPITAL_COMMUNITY)
Admission: RE | Admit: 2020-11-28 | Discharge: 2020-11-28 | Disposition: A | Discharge status: Home / Self Care | Payer: Medicare HMO | Source: Ambulatory Visit | Attending: Cardiology | Admitting: Cardiology

## 2020-11-28 DIAGNOSIS — I214 Non-ST elevation (NSTEMI) myocardial infarction: Secondary | ICD-10-CM

## 2020-11-29 NOTE — Progress Notes (Signed)
Cardiac Individual Treatment Plan  Patient Details  Name: Brenda Wilkins MRN: [id number] Date of Birth: [birth year] Referring Provider:   Flowsheet Row CARDIAC REHAB PHASE II ORIENTATION from 09/27/2020 in [facility] CARDIAC REHAB  Referring Provider Meriam Sprague, MD      Initial Encounter Date:  Flowsheet Row CARDIAC REHAB PHASE II ORIENTATION from 09/27/2020 in [facility] CARDIAC REHAB  Date 09/27/20      Visit Diagnosis: 08/07/2020 NSTEMI  Patient's Home Medications on Admission:  Current Outpatient Medications:  .  acetaminophen (TYLENOL) 325 MG tablet, Take 325-650 mg by mouth every 6 (six) hours as needed for mild pain (or headaches)., Disp: , Rfl:  .  aspirin 81 MG EC tablet, Take 1 tablet (81 mg total) by mouth daily. Swallow whole., Disp: 30 tablet, Rfl: 11 .  atorvastatin (LIPITOR) 80 MG tablet, Take 1 tablet (80 mg total) by mouth daily., Disp: 90 tablet, Rfl: 3 .  Cholecalciferol (VITAMIN D) 50 MCG (2000 UT) CAPS, Take 1 capsule by mouth daily., Disp: , Rfl:  .  colchicine 0.6 MG tablet, Take 1.2mg  (2 tablets) for the first dose and the 0.6mg (1 tablet) one hour later., Disp: 3 tablet, Rfl: 0 .  ferrous fumarate-b12-vitamic C-folic acid (FEROCON) capsule, Take 1 capsule by mouth 2 (two) times daily., Disp: , Rfl:  .  metoprolol succinate (TOPROL-XL) 25 MG 24 hr tablet, Take 0.5 tablets (12.5 mg total) by mouth daily., Disp: 90 tablet, Rfl: 3 .  nitroGLYCERIN (NITROSTAT) 0.4 MG SL tablet, PLACE 1 TABLET (0.4 MG TOTAL) UNDER THE TONGUE EVERY FIVE MINUTES X 3 DOSES AS NEEDED FOR CHEST PAIN., Disp: 25 tablet, Rfl: 0 .  TART CHERRY PO, Take 1 capsule by mouth daily., Disp: , Rfl:  .  ticagrelor (BRILINTA) 90 MG TABS tablet, Take 1 tablet (90 mg total) by mouth 2 (two) times daily., Disp: 180 tablet, Rfl: 3  Past Medical History: Past Medical History:  Diagnosis Date  . Coronary artery disease   . Hyperlipidemia   . Hypertension   .  NSTEMI (non-ST elevated myocardial infarction) (HCC) 07/16/2020  . 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    Tobacco Use: Social History   Tobacco Use  Smoking Status Never Smoker  Smokeless Tobacco Never Used    Labs: Recent Review Flowsheet Data    Labs for ITP Cardiac and Pulmonary Rehab Latest Ref Rng & Units 07/19/2020 07/19/2020 07/19/2020 07/19/2020 11/18/2020   Cholestrol 100 - 199 mg/dL - - - - 782   LDLCALC 0 - 99 mg/dL - - - - 67   HDL >95 mg/dL - - - - 62(Z)   Trlycerides 0 - 149 mg/dL - - - - 308(M)   Hemoglobin A1c 4.8 - 5.6 % - - - - -   PHART 7.350 - 7.450 7.372 7.376 7.387 7.353 -   PCO2ART 32.0 - 48.0 mmHg 48.5(H) 45.1 42.2 44.8 -   HCO3 20.0 - 28.0 mmol/L 28.0 26.2 25.2 24.7 -   TCO2 22 - 32 mmol/L -   ACIDBASEDEF 0.0 - 2.0 mmol/L - - - 1.0 -   O2SAT % 98.0 98.0 97.0 96.0 -      Capillary Blood Glucose: Lab Results  Component Value Date   GLUCAP 89 07/21/2020   GLUCAP 95 07/21/2020   GLUCAP 100 (H) 07/21/2020   GLUCAP 93 07/21/2020   GLUCAP 104 (H) 07/20/2020     Exercise Target Goals: Exercise Program Goal: Individual exercise prescription set using  results from initial 6 min walk test and THRR while considering  patient's activity barriers and safety.   Exercise Prescription Goal: Initial exercise prescription builds to 30-45 minutes a day of aerobic activity, 2-3 days per week.  Home exercise guidelines will be given to patient during program as part of exercise prescription that the participant will acknowledge.  Activity Barriers & Risk Stratification:  Activity Barriers & Cardiac Risk Stratification - 09/27/20 1022      Activity Barriers & Cardiac Risk Stratification   Activity Barriers History of Falls    Cardiac Risk Stratification High           6 Minute Walk:  6 Minute Walk    Row Name 09/27/20 1051         6 Minute Walk   Phase Initial     Distance 1107 feet     Walk Time 6 minutes     # of Rest Breaks 0     MPH 2.1      METS 2.36     RPE 9     Perceived Dyspnea  0     VO2 Peak 8.27     Symptoms No     Resting HR 80 bpm     Resting BP 102/68     Resting Oxygen Saturation  98 %     Exercise Oxygen Saturation  during 6 min walk 99 %     Max Ex. HR 111 bpm     Max Ex. BP 128/74     2 Minute Post BP 110/69            Oxygen Initial Assessment:   Oxygen Re-Evaluation:   Oxygen Discharge (Final Oxygen Re-Evaluation):   Initial Exercise Prescription:  Initial Exercise Prescription - 09/27/20 1100      Date of Initial Exercise RX and Referring Provider   Date 09/27/20    Referring Provider Meriam Sprague, MD    Expected Discharge Date 11/25/20      NuStep   Level 1    SPM 85    Minutes 15    METs 2.3      Track   Laps 12    Minutes 15    METs 2.39      Prescription Details   Frequency (times per week) 3    Duration Progress to 30 minutes of continuous aerobic without signs/symptoms of physical distress      Intensity   THRR 40-80% of Max Heartrate 59-118    Ratings of Perceived Exertion 11-13    Perceived Dyspnea 0-4      Progression   Progression Continue to progress workloads to maintain intensity without signs/symptoms of physical distress.      Resistance Training   Training Prescription Yes    Weight 2 lbs    Reps 10-15           Perform Capillary Blood Glucose checks as needed.  Exercise Prescription Changes:   Exercise Prescription Changes    Row Name 10/24/20 1047 10/31/20 1045 11/14/20 1048 11/28/20 1050       Response to Exercise   Blood Pressure (Admit) 102/58 124/76 122/64 113/72    Blood Pressure (Exercise) 122/78 104/72 118/78 114/78    Blood Pressure (Exit) 102/72 104/68 102/62 108/69    Heart Rate (Admit) 93 bpm 92 bpm 83 bpm 82 bpm    Heart Rate (Exercise) 108 bpm 108 bpm 115 bpm 112 bpm    Heart Rate (Exit) 87 bpm 78 bpm  73 bpm 82 bpm    Rating of Perceived Exertion (Exercise) Symptoms none none none none    Comments Off  to a good start with exercise. -- -- --    Duration Continue with 30 min of aerobic exercise without signs/symptoms of physical distress. Continue with 30 min of aerobic exercise without signs/symptoms of physical distress. Continue with 30 min of aerobic exercise without signs/symptoms of physical distress. Continue with 30 min of aerobic exercise without signs/symptoms of physical distress.    Intensity THRR unchanged THRR unchanged THRR unchanged THRR unchanged         Progression   Progression Continue to progress workloads to maintain intensity without signs/symptoms of physical distress. Continue to progress workloads to maintain intensity without signs/symptoms of physical distress. Continue to progress workloads to maintain intensity without signs/symptoms of physical distress. Continue to progress workloads to maintain intensity without signs/symptoms of physical distress.    Average METs 2 2.2 2.5 2.7         Resistance Training   Training Prescription Yes Yes Yes Yes    Weight 2 lbs 2 lbs 2 lbs 3 lbs    Reps 10-15 10-15 10-15 10-15    Time 10 Minutes 10 Minutes 10 Minutes 10 Minutes         Interval Training   Interval Training No No No No         NuStep   Level SPM 85 85 85 85    Minutes METs 1.6 1.6 2 2.2         Track   Laps Minutes METs 2.51 2.86 2.97 3.2         Home Exercise Plan   Plans to continue exercise at -- -- Home (comment)  Walking Home (comment)  Walking    Frequency -- -- Add 2 additional days to program exercise sessions. Add 2 additional days to program exercise sessions.    Initial Home Exercises Provided -- -- 11/07/20 11/07/20           Exercise Comments:   Exercise Comments    Row Name 10/24/20 1145 11/07/20 1049 11/16/20 1110 11/28/20 1120     Exercise Comments Patient tolerated low intensity exercise without symptoms. Reviewed home exercise, METs, and goals with patient.  Reviewed METs with patient. Reviewed METs and goals with patient.           Exercise Goals and Review:   Exercise Goals    Row Name 09/27/20 1033             Exercise Goals   Increase Physical Activity Yes       Intervention Provide advice, education, support and counseling about physical activity/exercise needs.;Develop an individualized exercise prescription for aerobic and resistive training based on initial evaluation findings, risk stratification, comorbidities and participant's personal goals.       Expected Outcomes Short Term: Attend rehab on a regular basis to increase amount of physical activity.;Long Term: Add in home exercise to make exercise part of routine and to increase amount of physical activity.;Long Term: Exercising regularly at least 3-5 days a week.       Increase Strength and Stamina Yes       Intervention Provide advice, education, support and counseling about physical activity/exercise needs.;Develop an individualized exercise prescription for aerobic  and resistive training based on initial evaluation findings, risk stratification, comorbidities and participant's personal goals.       Expected Outcomes Short Term: Increase workloads from initial exercise prescription for resistance, speed, and METs.;Long Term: Improve cardiorespiratory fitness, muscular endurance and strength as measured by increased METs and functional capacity ( );Short Term: Perform resistance training exercises routinely during rehab and add in resistance training at home       Able to understand and use rate of perceived exertion (RPE) scale Yes       Intervention Provide education and explanation on how to use RPE scale       Expected Outcomes Short Term: Able to use RPE daily in rehab to express subjective intensity level;Long Term:  Able to use RPE to guide intensity level when exercising independently       Knowledge and understanding of Target Heart Rate Range (THRR) Yes        Intervention Provide education and explanation of THRR including how the numbers were predicted and where they are located for reference       Expected Outcomes Short Term: Able to state/look up THRR;Long Term: Able to use THRR to govern intensity when exercising independently;Short Term: Able to use daily as guideline for intensity in rehab       Able to check pulse independently Yes       Intervention Provide education and demonstration on how to check pulse in carotid and radial arteries.;Review the importance of being able to check your own pulse for safety during independent exercise       Expected Outcomes Short Term: Able to explain why pulse checking is important during independent exercise;Long Term: Able to check pulse independently and accurately       Understanding of Exercise Prescription Yes       Intervention Provide education, explanation, and written materials on patient's individual exercise prescription       Expected Outcomes Short Term: Able to explain program exercise prescription;Long Term: Able to explain home exercise prescription to exercise independently              Exercise Goals Re-Evaluation :  Exercise Goals Re-Evaluation    Row Name 10/24/20 1145 11/07/20 1049 11/28/20 1120         Exercise Goal Re-Evaluation   Exercise Goals Review Increase Physical Activity;Able to understand and use rate of perceived exertion (RPE) scale Increase Physical Activity;Able to understand and use rate of perceived exertion (RPE) scale;Knowledge and understanding of Target Heart Rate Range (THRR);Able to check pulse independently;Increase Strength and Stamina;Understanding of Exercise Prescription Increase Physical Activity;Able to understand and use rate of perceived exertion (RPE) scale;Knowledge and understanding of Target Heart Rate Range (THRR);Able to check pulse independently;Increase Strength and Stamina;Understanding of Exercise Prescription     Comments Patient able to  understand and use RPE scale appropriately. Patient is walking 20 minutes, 2-3 days/week in addition to exercise at cardiac rehab and doing well. Encouraged to increase duration, and patient is amenable to this. Patient knows how to manually count her pulse. Patient not sure if she has hand weights at home, but can use household items for resistance training. Patient is progressing well with exercise. Patient is walking consistently at home in addition to exercise at cardiac rehab. Patient feels that she is making progress and is getting "back to normal".     Expected Outcomes Progress workloads as tolerated to help increase cardiorespiratory fitness. Patient will increase walking duration at home to achieve 150 minutes of  aerobic exercise per week Progress workloads as tolerated to help increase strength and stamina.            Discharge Exercise Prescription (Final Exercise Prescription Changes):  Exercise Prescription Changes - 11/28/20 1050      Response to Exercise   Blood Pressure (Admit) 113/72    Blood Pressure (Exercise) 114/78    Blood Pressure (Exit) 108/69    Heart Rate (Admit) 82 bpm    Heart Rate (Exercise) 112 bpm    Heart Rate (Exit) 82 bpm    Rating of Perceived Exertion (Exercise) 12    Symptoms none    Duration Continue with 30 min of aerobic exercise without signs/symptoms of physical distress.    Intensity THRR unchanged      Progression   Progression Continue to progress workloads to maintain intensity without signs/symptoms of physical distress.    Average METs 2.7      Resistance Training   Training Prescription Yes    Weight 3 lbs    Reps 10-15    Time 10 Minutes      Interval Training   Interval Training No      NuStep   Level 2    SPM 85    Minutes 15    METs 2.2      Track   Laps 19    Minutes 15    METs 3.2      Home Exercise Plan   Plans to continue exercise at Home (comment)   Walking   Frequency Add 2 additional days to program exercise  sessions.    Initial Home Exercises Provided 11/07/20           Nutrition:  Target Goals: Understanding of nutrition guidelines, daily intake of sodium 1500mg , cholesterol 200mg , calories 30% from fat and 7% or less from saturated fats, daily to have 5 or more servings of fruits and vegetables.  Biometrics:  Pre Biometrics - 09/27/20 1023      Pre Biometrics   Waist Circumference 36.75 inches    Hip Circumference 39.75 inches    Waist to Hip Ratio 0.92 %    Triceps Skinfold 26 mm    % Body Fat 40.5 %    Grip Strength 25.5 kg    Flexibility 14.63 in    Single Leg Stand 25.93 seconds            Nutrition Therapy Plan and Nutrition Goals:  Nutrition Therapy & Goals - 10/31/20 1151      Nutrition Therapy   Diet TLC    Drug/Food Interactions Statins/Certain Fruits      Personal Nutrition Goals   Nutrition Goal Pt to build a healthy plate including vegetables, fruits, whole grains, and low-fat dairy products in a heart healthy meal plan.    Personal Goal #2 Pt to read labels to reduce saturated fat to <16 g per day and increase fiber >28 g per day      Intervention Plan   Intervention Prescribe, educate and counsel regarding individualized specific dietary modifications aiming towards targeted core components such as weight, hypertension, lipid management, diabetes, heart failure and other comorbidities.;Nutrition handout(s) given to patient.    Expected Outcomes Short Term Goal: Understand basic principles of dietary content, such as calories, fat, sodium, cholesterol and nutrients.;Long Term Goal: Adherence to prescribed nutrition plan.           Nutrition Assessments:  MEDIFICTS Score Key:  ?70 Need to make dietary changes   40-70 Heart Healthy Diet  ?  40 Therapeutic Level Cholesterol Diet   Flowsheet Row CARDIAC REHAB PHASE II EXERCISE from 10/31/2020 in [facility] CARDIAC REHAB  Picture Your Plate Total Score on Admission 76     Picture  Your Plate Scores:  <16 Unhealthy dietary pattern with much room for improvement.  41-50 Dietary pattern unlikely to meet recommendations for good health and room for improvement.  51-60 More healthful dietary pattern, with some room for improvement.   >60 Healthy dietary pattern, although there may be some specific behaviors that could be improved.    Nutrition Goals Re-Evaluation:  Nutrition Goals Re-Evaluation    Row Name 10/31/20 1152 11/25/20 1327           Goals   Current Weight 140 lb (63.5 kg) 141 lb 8.6 oz (64.2 kg)      Nutrition Goal Pt to build a healthy plate including vegetables, fruits, whole grains, and low-fat dairy products in a heart healthy meal plan. Pt to build a healthy plate including vegetables, fruits, whole grains, and low-fat dairy products in a heart healthy meal plan.             Personal Goal #2 Re-Evaluation   Personal Goal #2 Pt to read labels to reduce saturated fat to <16 g per day and increase fiber >28 g per day Pt to read labels to reduce saturated fat to <16 g per day and increase fiber >28 g per day             Nutrition Goals Re-Evaluation:  Nutrition Goals Re-Evaluation    Row Name 10/31/20 1152 11/25/20 1327           Goals   Current Weight 140 lb (63.5 kg) 141 lb 8.6 oz (64.2 kg)      Nutrition Goal Pt to build a healthy plate including vegetables, fruits, whole grains, and low-fat dairy products in a heart healthy meal plan. Pt to build a healthy plate including vegetables, fruits, whole grains, and low-fat dairy products in a heart healthy meal plan.             Personal Goal #2 Re-Evaluation   Personal Goal #2 Pt to read labels to reduce saturated fat to <16 g per day and increase fiber >28 g per day Pt to read labels to reduce saturated fat to <16 g per day and increase fiber >28 g per day             Nutrition Goals Discharge (Final Nutrition Goals Re-Evaluation):  Nutrition Goals Re-Evaluation - 11/25/20 1327       Goals   Current Weight 141 lb 8.6 oz (64.2 kg)    Nutrition Goal Pt to build a healthy plate including vegetables, fruits, whole grains, and low-fat dairy products in a heart healthy meal plan.      Personal Goal #2 Re-Evaluation   Personal Goal #2 Pt to read labels to reduce saturated fat to <16 g per day and increase fiber >28 g per day           Psychosocial: Target Goals: Acknowledge presence or absence of significant depression and/or stress, maximize coping skills, provide positive support system. Participant is able to verbalize types and ability to use techniques and skills needed for reducing stress and depression.  Initial Review & Psychosocial Screening:  Initial Psych Review & Screening - 09/27/20 1129      Initial Review   Current issues with Current Stress Concerns    Source of Stress Concerns Chronic  Illness    Comments Debby says she had some stress when she initially had her open heart surgery and MI. Debby says that she is better now      [facility]   Good Support System? Yes   Debby lives alone but has her son and daughter for support.     Barriers   Psychosocial barriers to participate in program The patient should benefit from training in stress management and relaxation.      Screening Interventions   Interventions Encouraged to exercise    Expected Outcomes Long Term Goal: Stressors or current issues are controlled or eliminated.           Quality of Life Scores:  Quality of Life - 09/27/20 1151      Quality of Life   Select Quality of Life      Quality of Life Scores   Health/Function Pre 26.65 %    Socioeconomic Pre 27.36 %    Psych/Spiritual Pre 28.79 %    Family Pre 30 %    GLOBAL Pre 27.73 %          Scores of 19 and below usually indicate a poorer quality of life in these areas.  A difference of  2-3 points is a clinically meaningful difference.  A difference of 2-3 points in the total score of the Quality of Life Index has been  associated with significant improvement in overall quality of life, self-image, physical symptoms, and general health in studies assessing change in quality of life.  PHQ-9: Recent Review Flowsheet Data    Depression screen [facility] 09/27/2020   Decreased Interest 0   Down, Depressed, Hopeless 0   PHQ - 2 Score 0     Interpretation of Total Score  Total Score Depression Severity:  1-4 = Minimal depression, 5-9 = Mild depression, 10-14 = Moderate depression, 15-19 = Moderately severe depression, 20-27 = Severe depression   Psychosocial Evaluation and Intervention:   Psychosocial Re-Evaluation:  Psychosocial Re-Evaluation    Row Name 10/31/20 1717 11/28/20 1648           Psychosocial Re-Evaluation   Current issues with Current Stress Concerns Current Stress Concerns      Comments Debby denies having decreased stress concerns despite her recent Stent placement in [place] Debbie continues to deny having any increased psychological concerns or stressors      Expected Outcomes Debby will have decreased stressors upon completion of phase 2 cardiac rehab. Debby will have decreased stressors upon completion of phase 2 cardiac rehab.      Interventions Encouraged to attend Cardiac Rehabilitation for the exercise;Relaxation education;Stress management education Encouraged to attend Cardiac Rehabilitation for the exercise;Relaxation education;Stress management education      Continue Psychosocial Services  Follow up required by staff Follow up required by staff      Comments Will continue to monitor and offer support as needed Will continue to monitor and offer support as needed             Initial Review   Source of Stress Concerns Chronic Illness Chronic Illness             Psychosocial Discharge (Final Psychosocial Re-Evaluation):  Psychosocial Re-Evaluation - 11/28/20 1648      Psychosocial Re-Evaluation   Current issues with Current Stress Concerns    Comments Eunice BlaseDebbie continues to  deny having any increased psychological concerns or stressors    Expected Outcomes Debby will have decreased stressors upon completion of phase 2  cardiac rehab.    Interventions Encouraged to attend Cardiac Rehabilitation for the exercise;Relaxation education;Stress management education    Continue Psychosocial Services  Follow up required by staff    Comments Will continue to monitor and offer support as needed      Initial Review   Source of Stress Concerns Chronic Illness           Vocational Rehabilitation: Provide vocational rehab assistance to qualifying candidates.   Vocational Rehab Evaluation & Intervention:  Vocational Rehab - 09/27/20 1135      Initial Vocational Rehab Evaluation & Intervention   Assessment shows need for Vocational Rehabilitation No   Debby works full time and does not need vocational rehab at this time.          Education: Education Goals: Education classes will be provided on a weekly basis, covering required topics. Participant will state understanding/return demonstration of topics presented.  Learning Barriers/Preferences:  Learning Barriers/Preferences - 09/27/20 1133      Learning Barriers/Preferences   Learning Barriers Sight   wears contacts and glasses   Learning Preferences Audio;Computer/Internet;Group Instruction;Individual Instruction;Pictoral;Skilled Demonstration;Verbal Instruction;Video;Written Material           Education Topics: Count Your Pulse:  -Group instruction provided by verbal instruction, demonstration, patient participation and written materials to support subject.  Instructors address importance of being able to find your pulse and how to count your pulse when at home without a heart monitor.  Patients get hands on experience counting their pulse with staff help and individually.   Heart Attack, Angina, and Risk Factor Modification:  -Group instruction provided by verbal instruction, video, and written materials  to support subject.  Instructors address signs and symptoms of angina and heart attacks.    Also discuss risk factors for heart disease and how to make changes to improve heart health risk factors.   Functional Fitness:  -Group instruction provided by verbal instruction, demonstration, patient participation, and written materials to support subject.  Instructors address safety measures for doing things around the house.  Discuss how to get up and down off the floor, how to pick things up properly, how to safely get out of a chair without assistance, and balance training.   Meditation and Mindfulness:  -Group instruction provided by verbal instruction, patient participation, and written materials to support subject.  Instructor addresses importance of mindfulness and meditation practice to help reduce stress and improve awareness.  Instructor also leads participants through a meditation exercise.    Stretching for Flexibility and Mobility:  -Group instruction provided by verbal instruction, patient participation, and written materials to support subject.  Instructors lead participants through series of stretches that are designed to increase flexibility thus improving mobility.  These stretches are additional exercise for major muscle groups that are typically performed during regular warm up and cool down.   Hands Only CPR:  -Group verbal, video, and participation provides a basic overview of AHA guidelines for community CPR. Role-play of emergencies allow participants the opportunity to practice calling for help and chest compression technique with discussion of AED use.   Hypertension: -Group verbal and written instruction that provides a basic overview of hypertension including the most recent diagnostic guidelines, risk factor reduction with self-care instructions and medication management.    Nutrition I class: Heart Healthy Eating:  -Group instruction provided by PowerPoint slides,  verbal discussion, and written materials to support subject matter. The instructor gives an explanation and review of the Therapeutic Lifestyle Changes diet recommendations, which includes a  discussion on lipid goals, dietary fat, sodium, fiber, plant stanol/sterol esters, sugar, and the components of a well-balanced, healthy diet.   Nutrition II class: Lifestyle Skills:  -Group instruction provided by PowerPoint slides, verbal discussion, and written materials to support subject matter. The instructor gives an explanation and review of label reading, grocery shopping for heart health, heart healthy recipe modifications, and ways to make healthier choices when eating out.   Diabetes Question & Answer:  -Group instruction provided by PowerPoint slides, verbal discussion, and written materials to support subject matter. The instructor gives an explanation and review of diabetes co-morbidities, pre- and post-prandial blood glucose goals, pre-exercise blood glucose goals, signs, symptoms, and treatment of hypoglycemia and hyperglycemia, and foot care basics.   Diabetes Blitz:  -Group instruction provided by PowerPoint slides, verbal discussion, and written materials to support subject matter. The instructor gives an explanation and review of the physiology behind type 1 and type 2 diabetes, diabetes medications and rational behind using different medications, pre- and post-prandial blood glucose recommendations and Hemoglobin A1c goals, diabetes diet, and exercise including blood glucose guidelines for exercising safely.    Portion Distortion:  -Group instruction provided by PowerPoint slides, verbal discussion, written materials, and food models to support subject matter. The instructor gives an explanation of serving size versus portion size, changes in portions sizes over the last 20 years, and what consists of a serving from each food group.   Stress Management:  -Group instruction provided by  verbal instruction, video, and written materials to support subject matter.  Instructors review role of stress in heart disease and how to cope with stress positively.     Exercising on Your Own:  -Group instruction provided by verbal instruction, power point, and written materials to support subject.  Instructors discuss benefits of exercise, components of exercise, frequency and intensity of exercise, and end points for exercise.  Also discuss use of nitroglycerin and activating EMS.  Review options of places to exercise outside of rehab.  Review guidelines for sex with heart disease.   Cardiac Drugs I:  -Group instruction provided by verbal instruction and written materials to support subject.  Instructor reviews cardiac drug classes: antiplatelets, anticoagulants, beta blockers, and statins.  Instructor discusses reasons, side effects, and lifestyle considerations for each drug class.   Cardiac Drugs II:  -Group instruction provided by verbal instruction and written materials to support subject.  Instructor reviews cardiac drug classes: angiotensin converting enzyme inhibitors (ACE-I), angiotensin II receptor blockers (ARBs), nitrates, and calcium channel blockers.  Instructor discusses reasons, side effects, and lifestyle considerations for each drug class.   Anatomy and Physiology of the Circulatory System:  Group verbal and written instruction and models provide basic cardiac anatomy and physiology, with the coronary electrical and arterial systems. Review of: AMI, Angina, Valve disease, Heart Failure, Peripheral Artery Disease, Cardiac Arrhythmia, Pacemakers, and the ICD.   Other Education:  -Group or individual verbal, written, or video instructions that support the educational goals of the cardiac rehab program.   Holiday Eating Survival Tips:  -Group instruction provided by PowerPoint slides, verbal discussion, and written materials to support subject matter. The instructor gives  patients tips, tricks, and techniques to help them not only survive but enjoy the holidays despite the onslaught of food that accompanies the holidays.   Knowledge Questionnaire Score:  Knowledge Questionnaire Score - 09/27/20 1151      Knowledge Questionnaire Score   Pre Score 22/24           Core  Components/Risk Factors/Patient Goals at Admission:  Personal Goals and Risk Factors at Admission - 09/27/20 1147      Core Components/Risk Factors/Patient Goals on Admission    Weight Management Weight Maintenance;Yes    Intervention Weight Management: Develop a combined nutrition and exercise program designed to reach desired caloric intake, while maintaining appropriate intake of nutrient and fiber, sodium and fats, and appropriate energy expenditure required for the weight goal.;Weight Management: Provide education and appropriate resources to help participant work on and attain dietary goals.    Expected Outcomes Long Term: Adherence to nutrition and physical activity/exercise program aimed toward attainment of established weight goal;Weight Maintenance: Understanding of the daily nutrition guidelines, which includes 25-35% calories from fat, 7% or less cal from saturated fats, less than 200mg  cholesterol, less than 1.5gm of sodium, & 5 or more servings of fruits and vegetables daily;Understanding of distribution of calorie intake throughout the day with the consumption of 4-5 meals/snacks    Hypertension Yes    Intervention Provide education on lifestyle modifcations including regular physical activity/exercise, weight management, moderate sodium restriction and increased consumption of fresh fruit, vegetables, and low fat dairy, alcohol moderation, and smoking cessation.;Monitor prescription use compliance.    Expected Outcomes Short Term: Continued assessment and intervention until BP is < 140/45mm HG in hypertensive participants. < 130/55mm HG in hypertensive participants with diabetes,  heart failure or chronic kidney disease.;Long Term: Maintenance of blood pressure at goal levels.    Lipids Yes    Intervention Provide education and support for participant on nutrition & aerobic/resistive exercise along with prescribed medications to achieve LDL 70mg , HDL >40mg .    Expected Outcomes Short Term: Participant states understanding of desired cholesterol values and is compliant with medications prescribed. Participant is following exercise prescription and nutrition guidelines.;Long Term: Cholesterol controlled with medications as prescribed, with individualized exercise RX and with personalized nutrition plan. Value goals: LDL < 70mg , HDL > 40 mg.    Stress Yes    Intervention Offer individual and/or small group education and counseling on adjustment to heart disease, stress management and health-related lifestyle change. Teach and support self-help strategies.;Refer participants experiencing significant psychosocial distress to appropriate mental health specialists for further evaluation and treatment. When possible, include family members and significant others in education/counseling sessions.    Expected Outcomes Short Term: Participant demonstrates changes in health-related behavior, relaxation and other stress management skills, ability to obtain effective social support, and compliance with psychotropic medications if prescribed.;Long Term: Emotional wellbeing is indicated by absence of clinically significant psychosocial distress or social isolation.           Core Components/Risk Factors/Patient Goals Review:   Goals and Risk Factor Review    Row Name 10/25/20 1149 10/31/20 1720 11/28/20 1649         Core Components/Risk Factors/Patient Goals Review   Personal Goals Review Weight Management/Obesity;Hypertension;Lipids;Stress Weight Management/Obesity;Hypertension;Lipids;Stress Weight Management/Obesity;Hypertension;Lipids;Stress     Review Debby started exercise on  10/24/20 and did well with exercise. Debby continues to do well with exercise.Vital signs have been stable. Debby has not had any reports of chest pain and is doing well so far Debby continues to do well with exercise.Vital signs have been stable. Debby continues to deny having any chest discomfort with exercise     Expected Outcomes Debby will continue to participate in phase 2 cardiac rehab for exercise, nutrition and lifestyle modifications Debby will continue to participate in phase 2 cardiac rehab for exercise, nutrition and lifestyle modifications Debby will continue to participate in  phase 2 cardiac rehab for exercise, nutrition and lifestyle modifications            Core Components/Risk Factors/Patient Goals at Discharge (Final Review):   Goals and Risk Factor Review - 11/28/20 1649      Core Components/Risk Factors/Patient Goals Review   Personal Goals Review Weight Management/Obesity;Hypertension;Lipids;Stress    Review Debby continues to do well with exercise.Vital signs have been stable. Debby continues to deny having any chest discomfort with exercise    Expected Outcomes Debby will continue to participate in phase 2 cardiac rehab for exercise, nutrition and lifestyle modifications           ITP Comments:  ITP Comments    Row Name 09/27/20 1032 10/04/20 1338 10/25/20 1147 11/28/20 1643     ITP Comments Medical Director- Dr. Armanda Magic, MD 30 Day ITP Review. Patient is currently in the hospital. Exercise is currently on hold 30 Day ITP Reivew. Debbie started exerise on 10/24/20 and did well with exercise. Debby is post DES Diag 10/02/20 30 Day ITP Reivew. Eunice Blase has good attendance and participation in phase 2 cardiac rehab           Comments: See ITP Comments

## 2020-11-30 ENCOUNTER — Other Ambulatory Visit: Payer: Self-pay

## 2020-11-30 ENCOUNTER — Encounter (HOSPITAL_COMMUNITY)
Admission: RE | Admit: 2020-11-30 | Discharge: 2020-11-30 | Disposition: A | Discharge status: Home / Self Care | Payer: Medicare HMO | Source: Ambulatory Visit | Attending: Cardiology | Admitting: Cardiology

## 2020-11-30 DIAGNOSIS — I214 Non-ST elevation (NSTEMI) myocardial infarction: Secondary | ICD-10-CM

## 2020-11-30 MED ORDER — FEROCON PO CAPS: 1.0000 | ORAL_CAPSULE | Freq: Two times a day (BID) | ORAL | 3 refills | Status: DC

## 2020-11-30 NOTE — Telephone Encounter (Signed)
Yes please refill. Thanks. 

## 2020-12-02 ENCOUNTER — Encounter (HOSPITAL_COMMUNITY)
Admission: RE | Admit: 2020-12-02 | Discharge: 2020-12-02 | Disposition: A | Discharge status: Home / Self Care | Payer: Medicare HMO | Source: Ambulatory Visit | Attending: Cardiology | Admitting: Cardiology

## 2020-12-02 ENCOUNTER — Other Ambulatory Visit: Payer: Self-pay

## 2020-12-02 DIAGNOSIS — I214 Non-ST elevation (NSTEMI) myocardial infarction: Secondary | ICD-10-CM | POA: Diagnosis not present

## 2020-12-05 ENCOUNTER — Encounter (HOSPITAL_COMMUNITY)
Admission: RE | Admit: 2020-12-05 | Discharge: 2020-12-05 | Disposition: A | Discharge status: Home / Self Care | Payer: Medicare HMO | Source: Ambulatory Visit | Attending: Cardiology | Admitting: Cardiology

## 2020-12-05 ENCOUNTER — Other Ambulatory Visit: Payer: Self-pay

## 2020-12-05 DIAGNOSIS — I214 Non-ST elevation (NSTEMI) myocardial infarction: Secondary | ICD-10-CM | POA: Insufficient documentation

## 2020-12-07 ENCOUNTER — Encounter (HOSPITAL_COMMUNITY)
Admission: RE | Admit: 2020-12-07 | Discharge: 2020-12-07 | Disposition: A | Discharge status: Home / Self Care | Payer: Medicare HMO | Source: Ambulatory Visit | Attending: Cardiology | Admitting: Cardiology

## 2020-12-07 ENCOUNTER — Other Ambulatory Visit: Payer: Self-pay

## 2020-12-07 DIAGNOSIS — I214 Non-ST elevation (NSTEMI) myocardial infarction: Secondary | ICD-10-CM | POA: Diagnosis not present

## 2020-12-09 ENCOUNTER — Encounter (HOSPITAL_COMMUNITY)
Admission: RE | Admit: 2020-12-09 | Discharge: 2020-12-09 | Disposition: A | Discharge status: Home / Self Care | Payer: Medicare HMO | Source: Ambulatory Visit | Attending: Cardiology | Admitting: Cardiology

## 2020-12-09 ENCOUNTER — Other Ambulatory Visit: Payer: Self-pay

## 2020-12-09 DIAGNOSIS — I214 Non-ST elevation (NSTEMI) myocardial infarction: Secondary | ICD-10-CM

## 2020-12-12 ENCOUNTER — Other Ambulatory Visit: Payer: Self-pay

## 2020-12-12 ENCOUNTER — Encounter (HOSPITAL_COMMUNITY)
Admission: RE | Admit: 2020-12-12 | Discharge: 2020-12-12 | Disposition: A | Discharge status: Home / Self Care | Payer: Medicare HMO | Source: Ambulatory Visit | Attending: Cardiology | Admitting: Cardiology

## 2020-12-12 DIAGNOSIS — I214 Non-ST elevation (NSTEMI) myocardial infarction: Secondary | ICD-10-CM | POA: Diagnosis not present

## 2020-12-12 NOTE — Progress Notes (Signed)
[facility] [address] [place] [address] Phone: [phone] Subjective:   Bruce Donath, am serving as a scribe for Dr. Antoine Primas. This visit occurred during the SARS-CoV-2 public health emergency.  Safety protocols were in place, including screening questions prior to the visit, additional usage of staff PPE, and extensive cleaning of exam room while observing appropriate contact time as indicated for disinfecting solutions.   I'm seeing this patient by the request  of:  Patient, No Pcp Per (Inactive)  CC: Left wrist pain follow-up  ONG:[id number]   11/01/2020 Discussed with patient in great length.  Patient does have a questionable cyst within the scaphoid lunate area.  Very small at this time.  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  Also underlying arthritic changes.  All of these could be contributing to some of his aches and pains that patient is having.  At this moment we will try some range of motion exercises very minimal.  Discussed avoiding certain things that could be potentially associated with gout as well.  We discussed possible tart cherry supplementation.  X-rays are pending.  Follow-up with me again in 4 to 6 weeks worsening pain can consider formal physical therapy and will consider repeat ultrasound to further evaluate the cyst.  Update 12/13/2020 Brenda Wilkins is a 75 y.o. female coming in with complaint of L wrist pain. Patient states that she has been using tart cherry and Vit D. Pain has subsided.  Patient states that she is feeling 95% better.  Patient states that it is made a significant difference.  Able to do all activities of daily living unable to do things with no significant discomfort at the time.  Patient is aware of the compared to the contralateral side but no significant pain      Past Medical History:  Diagnosis Date  . Coronary artery disease   . Hyperlipidemia   .  Hypertension   . NSTEMI (non-ST elevated myocardial infarction) (HCC) 07/16/2020  . 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 CARDIAC CATHETERIZATION  08/08/2020  . CORONARY ARTERY BYPASS GRAFT N/A 07/18/2020   Procedure: CORONARY ARTERY BYPASS GRAFTING (CABG), ON PUMP, TIMES THREE, USING ENDOSCOPICALLY HARVESTED RIGHT GREATER SAPHENOUS VEIN;  Surgeon: Alleen Borne, MD;  Location: [facility] OR;  Service: Open Heart Surgery;  Laterality: N/A;  . CORONARY STENT INTERVENTION N/A 10/03/2020   Procedure: CORONARY STENT INTERVENTION;  Surgeon: Runell Gess, MD;  Location: [facility] INVASIVE CV LAB;  Service: Cardiovascular;  Laterality: N/A;  . CORONARY/GRAFT ACUTE MI REVASCULARIZATION N/A 07/18/2020   Procedure: Coronary/Graft Acute MI Revascularization;  Surgeon: [place], Peter M, MD;  Location: [facility] INVASIVE CV LAB;  Service: Cardiovascular;  Laterality: N/A;  . INTRAVASCULAR PRESSURE WIRE/FFR STUDY N/A 10/03/2020   Procedure: INTRAVASCULAR PRESSURE WIRE/FFR STUDY;  Surgeon: Runell Gess, MD;  Location: [facility] INVASIVE CV LAB;  Service: Cardiovascular;  Laterality: N/A;  . LEFT HEART CATH AND CORONARY ANGIOGRAPHY N/A 07/18/2020   Procedure: LEFT HEART CATH AND CORONARY ANGIOGRAPHY;  Surgeon: Tonny Bollman, MD;  [facility] INVASIVE CV LAB;  Service: Cardiovascular;  Laterality: N/A;  . LEFT HEART CATH AND CORS/GRAFTS ANGIOGRAPHY N/A 08/08/2020   Procedure: LEFT HEART CATH AND CORS/GRAFTS ANGIOGRAPHY;  Surgeon: Lennette Bihari, MD;  Location: [facility] INVASIVE CV LAB;  Service: Cardiovascular;  Laterality: N/A;  . LEFT HEART CATH AND CORS/GRAFTS ANGIOGRAPHY N/A 10/03/2020  Procedure: LEFT HEART CATH AND CORS/GRAFTS ANGIOGRAPHY;  Surgeon: Runell Gess, MD;  Location: [facility] INVASIVE CV LAB;  Service: Cardiovascular;  Laterality: N/A;  . TEE WITHOUT CARDIOVERSION N/A 07/18/2020   Procedure: TRANSESOPHAGEAL ECHOCARDIOGRAM (TEE);  Surgeon: Alleen Borne, MD;  Location: [facility] OR;   Service: Open Heart Surgery;  Laterality: N/A;   Social History   Socioeconomic History  . Marital status: Unknown    Spouse name: Not on file  . Number of children: Not on file  . Years of education: 28  . Highest education level: Not on file  Occupational History  . Not on file  Tobacco Use  . Smoking status: Never Smoker  . Smokeless tobacco: Never Used  Vaping Use  . Vaping Use: Never used  Substance and Sexual Activity  . Alcohol use: Never  . Drug use: Never  . Sexual activity: Not Currently  Other Topics Concern  . Not on file  Social History Narrative  . Not on file   Social Determinants of Health   Financial Resource Strain: Not on file  Food Insecurity: Not on file  Transportation Needs: Not on file  Physical Activity: Not on file  Stress: Not on file  Social Connections: Not on file   No Known Allergies No family history on file.   Current Outpatient Medications (Cardiovascular):  .  atorvastatin (LIPITOR) 80 MG tablet, Take 1 tablet (80 mg total) by mouth daily. .  metoprolol succinate (TOPROL-XL) 25 MG 24 hr tablet, Take 0.5 tablets (12.5 mg total) by mouth daily. .  nitroGLYCERIN (NITROSTAT) 0.4 MG SL tablet, PLACE 1 TABLET (0.4 MG TOTAL) UNDER THE TONGUE EVERY FIVE MINUTES X 3 DOSES AS NEEDED FOR CHEST PAIN.   Current Outpatient Medications (Analgesics):  .  acetaminophen (TYLENOL) 325 MG tablet, Take 325-650 mg by mouth every 6 (six) hours as needed for mild pain (or headaches). Marland Kitchen  aspirin 81 MG EC tablet, Take 1 tablet (81 mg total) by mouth daily. Swallow whole. .  colchicine 0.6 MG tablet, Take 1.2mg  (2 tablets) for the first dose and the 0.6mg (1 tablet) one hour later.  Current Outpatient Medications (Hematological):  .  ferrous fumarate-b12-vitamic C-folic acid (FEROCON) capsule, Take 1 capsule by mouth 2 (two) times daily. .  ticagrelor (BRILINTA) 90 MG TABS tablet, Take 1 tablet (90 mg total) by mouth 2 (two) times daily.  Current  Outpatient Medications (Other):  Marland Kitchen  Cholecalciferol (VITAMIN D) 50 MCG (2000 UT) CAPS, Take 1 capsule by mouth daily. Marland Kitchen  TART CHERRY PO, Take 1 capsule by mouth daily.   Reviewed prior external information including notes and imaging from  primary care provider As well as notes that were available from care everywhere and other healthcare systems.  Past medical history, social, surgical and family history all reviewed in electronic medical record.  No pertanent information unless stated regarding to the chief complaint.   Review of Systems:  No headache, visual changes, nausea, vomiting, diarrhea, constipation, dizziness, abdominal pain, skin rash, fevers, chills, night sweats, weight loss, swollen lymph nodes, body aches, joint swelling, chest pain, shortness of breath, mood changes. POSITIVE muscle aches  Objective  Blood pressure 120/84, pulse 71, height 4\' 11"  (1.499 m), SpO2 98 %.   General: No apparent distress alert and oriented x3 mood and affect normal, dressed appropriately.  HEENT: Pupils equal, extraocular movements intact  Respiratory: Patient's speak in full sentences and does not appear short of breath  Cardiovascular: No lower extremity edema, non tender, no erythema  Gait normal with good balance and coordination.  MSK:  Left wrist exam shows the patient does have good range of motion.  Lacks last 5 degrees of dorsiflexion.  Minimal discomfort over the TFCC area but otherwise good grip strength.    Impression and Recommendations:     The above documentation has been reviewed and is accurate and complete Judi Saa, DO

## 2020-12-13 ENCOUNTER — Ambulatory Visit: Payer: Self-pay

## 2020-12-13 ENCOUNTER — Ambulatory Visit: Payer: Medicare HMO | Admitting: Family Medicine

## 2020-12-13 ENCOUNTER — Encounter: Payer: Self-pay | Admitting: Family Medicine

## 2020-12-13 VITALS — BP 120/84 | HR 71 | Ht 59.0 in

## 2020-12-13 DIAGNOSIS — M25532 Pain in left wrist: Secondary | ICD-10-CM

## 2020-12-13 NOTE — Assessment & Plan Note (Signed)
Patient is doing nearly 100% better at this time.  Still has some mild tightness with dorsiflexion of the wrist.  Some very minimal discomfort over the TFCC area.  Patient does have some mild arthritic changes but no significant swelling noted.  Patient feels like she is making significant improvement.  X-rays only showed some mild to moderate arthritic changes of the first and second compartments.  Patient overall is feeling much better and will follow-up as needed.

## 2020-12-13 NOTE — Patient Instructions (Signed)
Looks great See me when you need me!

## 2020-12-14 ENCOUNTER — Encounter (HOSPITAL_COMMUNITY)
Admission: RE | Admit: 2020-12-14 | Discharge: 2020-12-14 | Disposition: A | Discharge status: Home / Self Care | Payer: Medicare HMO | Source: Ambulatory Visit | Attending: Cardiology | Admitting: Cardiology

## 2020-12-14 ENCOUNTER — Other Ambulatory Visit: Payer: Self-pay

## 2020-12-14 VITALS — BP 100/60 | HR 83 | Ht 59.75 in | Wt 142.9 lb

## 2020-12-14 DIAGNOSIS — I214 Non-ST elevation (NSTEMI) myocardial infarction: Secondary | ICD-10-CM | POA: Diagnosis not present

## 2020-12-16 ENCOUNTER — Telehealth (HOSPITAL_COMMUNITY): Payer: Self-pay

## 2020-12-16 ENCOUNTER — Telehealth (HOSPITAL_COMMUNITY): Payer: Self-pay | Admitting: *Deleted

## 2020-12-16 ENCOUNTER — Encounter (HOSPITAL_COMMUNITY): Payer: Self-pay | Admitting: *Deleted

## 2020-12-16 ENCOUNTER — Encounter (HOSPITAL_COMMUNITY): Payer: Medicare HMO

## 2020-12-16 DIAGNOSIS — I214 Non-ST elevation (NSTEMI) myocardial infarction: Secondary | ICD-10-CM

## 2020-12-16 NOTE — Telephone Encounter (Signed)
Brenda Wilkins was not able to return today for graduation as she was exposed to her granddaughter who tested positive for Covid. Medications reviewed over the phone. Depression screening asked.Gladstone Lighter, RN,BSN 12/16/2020 2:35 PM

## 2020-12-16 NOTE — Progress Notes (Signed)
Discharge Progress Report  Patient Details  Name: Brenda Wilkins MRN: [id number] Date of Birth: [birth year] Referring Provider:   Flowsheet Row CARDIAC REHAB PHASE II ORIENTATION from 09/27/2020 in [facility]  Referring Provider Freada Bergeron, MD       Number of Visits: 23  Reason for Discharge:  Patient reached a stable level of exercise. Patient independent in their exercise. Patient has met program and personal goals.  Smoking History:  Social History   Tobacco Use  Smoking Status Never Smoker  Smokeless Tobacco Never Used    Diagnosis:  08/07/2020 NSTEMI  ADL [facility]:   Initial Exercise Prescription:   Discharge Exercise Prescription (Final Exercise Prescription Changes):  Exercise Prescription Changes - 12/14/20 1049      Response to Exercise   Blood Pressure (Admit) 100/60    Blood Pressure (Exercise) 138/60    Blood Pressure (Exit) 102/68    Heart Rate (Admit) 83 bpm    Heart Rate (Exercise) 121 bpm    Heart Rate (Exit) 83 bpm    Rating of Perceived Exertion (Exercise) 12    Symptoms none    Duration Continue with 30 min of aerobic exercise without signs/symptoms of physical distress.    Intensity THRR unchanged      Progression   Progression Continue to progress workloads to maintain intensity without signs/symptoms of physical distress.    Average METs 2.8      Resistance Training   Training Prescription No      Interval Training   Interval Training No      NuStep   Level 3    SPM 85    Minutes 15    METs 2.5      Track   Laps 18    Minutes 15    METs 3.09      Home Exercise Plan   Plans to continue exercise at Home (comment)   Walking   Frequency Add 2 additional days to program exercise sessions.    Initial Home Exercises Provided 11/07/20           Functional Capacity:  6 Minute Walk    Row Name 12/12/20 1054         6 Minute Walk   Phase Discharge     Distance 1647 feet     Distance %  Change 48.78 %     Distance Feet Change 540 ft     Walk Time 6 minutes     # of Rest Breaks 0     MPH 3.12     METS 3.47     RPE 12     Perceived Dyspnea  0     VO2 Peak 12.15     Symptoms No     Resting HR 88 bpm     Resting BP 120/70     Max Ex. HR 111 bpm     Max Ex. BP 148/78     2 Minute Post BP 104/78            Psychological, QOL, Others - Outcomes: PHQ 2/9: Depression screen PHQ 2/9 09/27/2020  Decreased Interest 0  Down, Depressed, Hopeless 0  PHQ - 2 Score 0    Quality of Life:  Quality of Life - 12/13/20 0750      Quality of Life   Select Quality of Life      Quality of Life Scores   Health/Function Pre 26.65 %    Health/Function Post 26.04 %  Health/Function % Change -2.29 %    Socioeconomic Pre 27.36 %    Socioeconomic Post 28.21 %    Socioeconomic % Change  3.11 %    Psych/Spiritual Pre 28.79 %    Psych/Spiritual Post 27.86 %    Psych/Spiritual % Change -3.23 %    Family Pre 30 %    Family Post 28.5 %    Family % Change -5 %    GLOBAL Pre 27.73 %    GLOBAL Post 27.22 %    GLOBAL % Change -1.84 %           Personal Goals: Goals established at orientation with interventions provided to work toward goal.    Personal Goals Discharge:  Goals and Risk Factor Review    Row Name 10/25/20 1149 10/31/20 1720 11/28/20 1649 12/29/20 0818       Core Components/Risk Factors/Patient Goals Review   Personal Goals Review Weight Management/Obesity;Hypertension;Lipids;Stress Weight Management/Obesity;Hypertension;Lipids;Stress Weight Management/Obesity;Hypertension;Lipids;Stress Weight Management/Obesity;Hypertension;Lipids;Stress    Review Brenda Wilkins started exercise on 10/24/20 and did well with exercise. Brenda Wilkins continues to do well with exercise.Vital signs have been stable. Brenda Wilkins has not had any reports of chest pain and is doing well so far Brenda Wilkins continues to do well with exercise.Vital signs have been stable. Brenda Wilkins continues to deny having any chest  discomfort with exercise Brenda Wilkins did well with exercise.Vital signs were stable. Brenda Wilkins completed cardiac rehab on 12/14/20.    Expected Outcomes Brenda Wilkins will continue to participate in phase 2 cardiac rehab for exercise, nutrition and lifestyle modifications Brenda Wilkins will continue to participate in phase 2 cardiac rehab for exercise, nutrition and lifestyle modifications Brenda Wilkins will continue to participate in phase 2 cardiac rehab for exercise, nutrition and lifestyle modifications Brenda Wilkins will continue to  exercise, nutrition and lifestyle modifications upon completion of phase 2 cardiac rehab           Exercise Goals and Review:   Exercise Goals Re-Evaluation:  Exercise Goals Re-Evaluation    Row Name 10/24/20 1145 11/07/20 1049 11/28/20 1120 12/12/20 1104 12/16/20 0911     Exercise Goal Re-Evaluation   Exercise Goals Review Increase Physical Activity;Able to understand and use rate of perceived exertion (RPE) scale Increase Physical Activity;Able to understand and use rate of perceived exertion (RPE) scale;Knowledge and understanding of Target Heart Rate Range (THRR);Able to check pulse independently;Increase Strength and Stamina;Understanding of Exercise Prescription Increase Physical Activity;Able to understand and use rate of perceived exertion (RPE) scale;Knowledge and understanding of Target Heart Rate Range (THRR);Able to check pulse independently;Increase Strength and Stamina;Understanding of Exercise Prescription Increase Physical Activity;Able to understand and use rate of perceived exertion (RPE) scale;Knowledge and understanding of Target Heart Rate Range (THRR);Able to check pulse independently;Increase Strength and Stamina;Understanding of Exercise Prescription Increase Physical Activity;Able to understand and use rate of perceived exertion (RPE) scale;Knowledge and understanding of Target Heart Rate Range (THRR);Able to check pulse independently;Increase Strength and Stamina;Understanding of  Exercise Prescription   Comments Patient able to understand and use RPE scale appropriately. Patient is walking 20 minutes, 2-3 days/week in addition to exercise at cardiac rehab and doing well. Encouraged to increase duration, and patient is amenable to this. Patient knows how to manually count her pulse. Patient not sure if she has hand weights at home, but can use household items for resistance training. Patient is progressing well with exercise. Patient is walking consistently at home in addition to exercise at cardiac rehab. Patient feels that she is making progress and is getting "back to normal". Patient will complete  the cardiac rehab program and is doing well with exercise. Patient feels that she has achieved her personal goal of "getting back to normal". Patient's functional capacity increased 49% as measured by 6 minutes walk test. Patient called out today due to [place] exposure. Patient decided not makeup her last exercise session but will continue her home exercise routine at this time.   Expected Outcomes Progress workloads as tolerated to help increase cardiorespiratory fitness. Patient will increase walking duration at home to achieve 150 minutes of aerobic exercise per week Progress workloads as tolerated to help increase strength and stamina. Continue to progress workloads to increase strength and stamina. Patient will walk 30 minutes 5-7 days/week to maintain health and fitness gains.          Nutrition & Weight - Outcomes:   Post Biometrics - 12/14/20 1108       Post  Biometrics   Height 4' 11.75" (1.518 m)    Waist Circumference 36.25 inches    Hip Circumference 39 inches    Waist to Hip Ratio 0.93 %    BMI (Calculated) 28.12    Triceps Skinfold 24 mm    % Body Fat 39.9 %    Grip Strength 24 kg    Flexibility 13.13 in    Single Leg Stand 30 seconds           Nutrition:  Nutrition Therapy & Goals - 10/31/20 1151      Nutrition Therapy   Diet TLC    Drug/Food  Interactions Statins/Certain Fruits      Personal Nutrition Goals   Nutrition Goal Pt to build a healthy plate including vegetables, fruits, whole grains, and low-fat dairy products in a heart healthy meal plan.    Personal Goal #2 Pt to read labels to reduce saturated fat to <16 g per day and increase fiber >28 g per day      Intervention Plan   Intervention Prescribe, educate and counsel regarding individualized specific dietary modifications aiming towards targeted core components such as weight, hypertension, lipid management, diabetes, heart failure and other comorbidities.;Nutrition handout(s) given to patient.    Expected Outcomes Short Term Goal: Understand basic principles of dietary content, such as calories, fat, sodium, cholesterol and nutrients.;Long Term Goal: Adherence to prescribed nutrition plan.           Nutrition Discharge:   Education Questionnaire Score:  Knowledge Questionnaire Score - 12/13/20 0745      Knowledge Questionnaire Score   Pre Score 22/24    Post Score 23/24           Goals reviewed with patient; copy given to patient. Pt graduated from cardiac rehab program on 12/14/20 with completion of 23 exercise sessions in Phase II. Pt maintained good attendance and progressed nicely during her participation in rehab as evidenced by increased MET level.   Medication list reconciled. Repeat  PHQ score-0  .  Pt has made significant lifestyle changes and should be commended for her success. Pt feels she has achieved her goals during cardiac rehab.   Pt plans to continue exercise by walking. We are proud of Brenda Wilkins's progress! Brenda Wilkins increased her distance on her post exercise walk test by 540 feet.Barnet Pall, RN,BSN 12/29/2020 8:21 AM

## 2020-12-29 DIAGNOSIS — H25013 Cortical age-related cataract, bilateral: Secondary | ICD-10-CM | POA: Diagnosis not present

## 2020-12-29 DIAGNOSIS — H25043 Posterior subcapsular polar age-related cataract, bilateral: Secondary | ICD-10-CM | POA: Diagnosis not present

## 2020-12-29 DIAGNOSIS — H2512 Age-related nuclear cataract, left eye: Secondary | ICD-10-CM | POA: Diagnosis not present

## 2020-12-29 DIAGNOSIS — H2513 Age-related nuclear cataract, bilateral: Secondary | ICD-10-CM | POA: Diagnosis not present

## 2020-12-29 DIAGNOSIS — H20023 Recurrent acute iridocyclitis, bilateral: Secondary | ICD-10-CM | POA: Diagnosis not present

## 2021-01-25 DIAGNOSIS — H2512 Age-related nuclear cataract, left eye: Secondary | ICD-10-CM | POA: Diagnosis not present

## 2021-01-26 DIAGNOSIS — H2511 Age-related nuclear cataract, right eye: Secondary | ICD-10-CM | POA: Diagnosis not present

## 2021-02-15 DIAGNOSIS — H2511 Age-related nuclear cataract, right eye: Secondary | ICD-10-CM | POA: Diagnosis not present

## 2021-02-24 ENCOUNTER — Encounter: Payer: Self-pay | Admitting: Cardiology

## 2021-03-13 ENCOUNTER — Ambulatory Visit: Payer: Medicare HMO | Admitting: Cardiology

## 2021-03-13 ENCOUNTER — Encounter (HOSPITAL_BASED_OUTPATIENT_CLINIC_OR_DEPARTMENT_OTHER): Payer: Self-pay

## 2021-03-13 DIAGNOSIS — U071 COVID-19: Secondary | ICD-10-CM | POA: Diagnosis not present

## 2021-04-05 ENCOUNTER — Other Ambulatory Visit: Payer: Self-pay

## 2021-04-05 ENCOUNTER — Ambulatory Visit (HOSPITAL_BASED_OUTPATIENT_CLINIC_OR_DEPARTMENT_OTHER): Payer: Medicare HMO | Admitting: Family

## 2021-04-05 ENCOUNTER — Encounter (HOSPITAL_BASED_OUTPATIENT_CLINIC_OR_DEPARTMENT_OTHER): Payer: Self-pay | Admitting: Family

## 2021-04-05 VITALS — BP 126/72 | HR 67 | Ht 59.75 in | Wt 142.0 lb

## 2021-04-05 DIAGNOSIS — M109 Gout, unspecified: Secondary | ICD-10-CM | POA: Diagnosis not present

## 2021-04-05 DIAGNOSIS — E785 Hyperlipidemia, unspecified: Secondary | ICD-10-CM

## 2021-04-05 DIAGNOSIS — I25118 Atherosclerotic heart disease of native coronary artery with other forms of angina pectoris: Secondary | ICD-10-CM

## 2021-04-05 DIAGNOSIS — Z951 Presence of aortocoronary bypass graft: Secondary | ICD-10-CM

## 2021-04-05 MED ORDER — COLCHICINE 0.6 MG PO TABS: 0.6000 mg | ORAL_TABLET | ORAL | 1 refills | Status: DC | PRN

## 2021-04-05 NOTE — Progress Notes (Signed)
Office Visit    Patient Name: Brenda Wilkins Date of Encounter: 04/05/2021  PCP:  Patient, No Pcp Per (Inactive)   [facility]  Cardiologist:  Meriam Sprague, MD  Advanced Practice Provider:  No care team member to display Electrophysiologist:  None     Chief Complaint    Brenda Wilkins is a 75 y.o. female with a hx of CAD s/p CABG 08/05/21 with subsequent NSTEMI (08/07/20 and 09/09/20) with multiple occluded grafts s/p PCI and SVG-Diag, HLD presents today for follow up   Past Medical History    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   Past Surgical History:  Procedure Laterality Date   CARDIAC CATHETERIZATION  08/08/2020   CORONARY ARTERY BYPASS GRAFT N/A 07/18/2020   Procedure: CORONARY ARTERY BYPASS GRAFTING (CABG), ON PUMP, TIMES THREE, USING ENDOSCOPICALLY HARVESTED RIGHT GREATER SAPHENOUS VEIN;  Surgeon: Alleen Borne, MD;  Location: [facility] OR;  Service: Open Heart Surgery;  Laterality: N/A;   CORONARY STENT INTERVENTION N/A 10/03/2020   Procedure: CORONARY STENT INTERVENTION;  Surgeon: Runell Gess, MD;  Location: [facility] INVASIVE CV LAB;  Service: Cardiovascular;  Laterality: N/A;   CORONARY/GRAFT ACUTE MI REVASCULARIZATION N/A 07/18/2020   Procedure: Coronary/Graft Acute MI Revascularization;  Surgeon: Swazil[place], Peter M, MD;  Location: [facility] INVASIVE CV LAB;  Service: Cardiovascular;  Laterality: N/A;   INTRAVASCULAR PRESSURE WIRE/FFR STUDY N/A 10/03/2020   Procedure: INTRAVASCULAR PRESSURE WIRE/FFR STUDY;  Surgeon: Runell Gess, MD;  Location: [facility] INVASIVE CV LAB;  Service: Cardiovascular;  Laterality: N/A;   LEFT HEART CATH AND CORONARY ANGIOGRAPHY N/A 07/18/2020   Procedure: LEFT HEART CATH AND CORONARY ANGIOGRAPHY;  Surgeon: Tonny Bollman, MD;  Location: [facility] INVASIVE CV LAB;  Service:  Cardiovascular;  Laterality: N/A;   LEFT HEART CATH AND CORS/GRAFTS ANGIOGRAPHY N/A 08/08/2020   Procedure: LEFT HEART CATH AND CORS/GRAFTS ANGIOGRAPHY;  Surgeon: Lennette Bihari, MD;  Location: [facility] INVASIVE CV LAB;  Service: Cardiovascular;  Laterality: N/A;   LEFT HEART CATH AND CORS/GRAFTS ANGIOGRAPHY N/A 10/03/2020   Procedure: LEFT HEART CATH AND CORS/GRAFTS ANGIOGRAPHY;  Surgeon: Runell Gess, MD;  Location: [facility] INVASIVE CV LAB;  Service: Cardiovascular;  Laterality: N/A;   TEE WITHOUT CARDIOVERSION N/A 07/18/2020   Procedure: TRANSESOPHAGEAL ECHOCARDIOGRAM (TEE);  Surgeon: Alleen Borne, MD;  Location: [facility] OR;  Service: Open Heart Surgery;  Laterality: N/A;    Allergies  No Known Allergies  History of Present Illness    Brenda Wilkins is a 75 y.o. female with a hx of CAD s/p CABG 08/05/21 with subsequent NSTEMI (08/07/20 and 09/09/20) with multiple occluded grafts s/p PCI and SVG-Diag, HLD last seen 11/18/20.  Suffered NSTEMI early 07/2020 found to have multivessel CAD on cath 07/17/20 s/p 3 vessel CABG 07/18/20 (SVG-LAD, SVG-diag and OM). Post op course complicated by recurrent chest pain causing her to re-present to the ED 08/07/2020.  She underwent repeat cath revealing occluded SVG-LAD but TIMI 3 flow down LAD from SVG-diagonal graft.  Case discussed with CV surgery and given brisk flow down LAD from other bypass graft decision was made to manage medically.  She did well until 09/29/2020 where she developed progressive chest pressure.  She presented 10/02/2020 and underwent coronary angiography with 95% stenosis of of the graft  the first diagonal and 100% stenosis of the origin  of the graft from first diagonal and OM 2.  DES placed at the origin of the diagonal branch SVG.  Post-cath course complicated by recurrent hematoma and pseudoaneurysm.  She was managed conservatively.  She was last seen in clinic 11/18/2020 doing well from a cardiac perspective.  She felt she was getting back to  "normal ".  No changes were made but she was referred to Dr. Katrinka Blazing for management of wrist arthritis and cyst.  She contacted the office [id number] she had contracted COVID and was following with urgent care.  She was treated with oral antiviral.   Presents today for follow-up. Tells me she feels nearly back to her baseline after having COVID. Completed course of antiviral.  She had a fever for 3 days but after that felt well. She notes residual very mild cough and post nasal drip which is overall not bothersome enough to take medication. She walks three times per week for 30 minutes. She checks her BP at home regularly with home readings 113/65.  Reports no chest pain, pressure, tightness.  Reports no dyspnea on exertion.  No orthopnea, PND, edema, palpitations.   EKGs/Labs/Other Studies Reviewed:   The following studies were reviewed today:  Ultrasound 10/18/20: Summary:  The avascular inhomogeneous structure noted in the right groin has  characteristics of a hematoma, measuring 6.7 cm in length and 4.4 cm in  width.    [place] PSA 10/04/20: A mixed echogenic structure measuring approximately 4.1 cm x 1.8 cm is  visualized at the right groin with ultrasound characteristics of a  hematoma.    ____________________   Left heart cath 10/03/20: Origin lesion is 100% stenosed. Ost Cx to Prox Cx lesion is 80% stenosed. Ost LAD to Mid LAD lesion is 100% stenosed. Origin to Prox Graft lesion before 1st Diag is 95% stenosed. Origin to Prox Graft lesion between 1st Diag and 3rd Mrg is 100% stenosed. A drug-eluting stent was successfully placed using a STENT RESOLUTE ONYX 4.5X22. Post intervention, there is a 10% residual stenosis. The left ventricular ejection fraction is 50-55% by visual estimate. The left ventricular systolic function is normal. LV end diastolic pressure is normal.   IMPRESSION: Successful PCI drug-eluting stenting of the origin of the diagonal branch SVG with occlusion of the  continuation of the diagonal to the obtuse marginal branch.  The DFR of the proximal native circumflex was 0.99 suggesting this was not physiologically significant.  There was dampening at the origin of the dominant RCA which had been demonstrated in several prior cardiac cath but no obstructive disease was noted.  The sheath was secured in place.  The patient was already on aspirin and ticagrelor.  The sheath will be removed once ACT falls below 170 pressure held.  Patient will be hydrated overnight, discharged home in the morning and follow-up with Dr. Shari Prows.` _____________   Echo 08/08/20:  1. Septal hypokinesis consistent with post-operative state. Left  ventricular ejection fraction, by estimation, is 50 to 55%. The left  ventricle has low normal function. The left ventricle demonstrates  regional wall motion abnormalities (see scoring  diagram/findings for description). Left ventricular diastolic parameters  are consistent with Grade I diastolic dysfunction (impaired relaxation).   2. Right ventricular systolic function is normal. The right ventricular  size is normal.   3. The mitral valve is normal in structure. Trivial mitral valve  regurgitation. No evidence of mitral stenosis.   4. The aortic valve is tricuspid. Aortic valve regurgitation is not  visualized. No aortic  stenosis is present.   5. The inferior vena cava is normal in size with greater than 50%  respiratory variability, suggesting right atrial pressure of 3 mmHg.    Cath 08/09/19: Prox Cx lesion is 80% stenosed. Prox LAD to Mid LAD lesion is 100% stenosed. Mid LAD lesion is 80% stenosed. Origin to Prox Graft lesion is 100% stenosed. Ost RCA lesion is 40% stenosed. There is mild to moderate left ventricular systolic dysfunction. LV end diastolic pressure is mildly elevated.   Mild LV dysfunction with mild anterolateral hypocontractility, EF estimate at 40% with LVEDP at 20 mmHg.   Significant native CAD with total  occlusion of the LAD immediately proximal to the takeoff of the diagonal vessel.   Normal bifurcating ramus intermediate vessel.   Large left circumflex coronary artery with 80% proximal stenosis and competitive filling of a large OM 3 vessel.   Smooth ostial narrowing of the RCA of approximately 30 to 40%.  However with catheter engagement there was significant catheter dampening which improved following IC nitroglycerin administration.   Occluded SVG which had supplied the LAD.   Patent sequential SVG supplying the diagonal vessel which then fills the LAD with previously noted 80% stenosis on an angle in the LAD immediately after the diagonal takeoff, and sequential graft supplying a large OM 3 vessel of the left circumflex coronary artery.   RECOMMENDATION: The patient's non-ST segment elevation myocardial infarction most likely is due to acute occlusion of the vein graft which had supplied the LAD.  This graft is 493 weeks old.  I suspect this occlusion may have contributed by the fact that the LAD is supplied by the diagonal vessel which is grafted and there is TIMI-3 flow down the LAD system.  There continues to be a high-grade 80+ percent stenosis on an angle in the LAD arising from the diagonal vessel which 3 weeks ago was not able to be entered with a wire during attempted acute angioplasty.  Since the patient is pain-free and there is a high likelihood of thrombus in the vein graft which has occluded plan initial aggressive medical management.  Will initiate DAPT with aspirin/Brilinta.  Continue beta-blocker therapy, initiate nitrate therapy, and continue aggressive lipid-lowering therapy.  If patient continues to have symptoms, consider addition of ranolazine and possible future intervention to the native LAD after an aggressive medical therapy trial.    Diagnostic Dominance: Right     Intervention     TTE 08/08/20: IMPRESSIONS   1. Septal hypokinesis consistent with post-operative  state. Left  ventricular ejection fraction, by estimation, is 50 to 55%. The left  ventricle has low normal function. The left ventricle demonstrates  regional wall motion abnormalities (see scoring  diagram/findings for description). Left ventricular diastolic parameters  are consistent with Grade I diastolic dysfunction (impaired relaxation).   2. Right ventricular systolic function is normal. The right ventricular  size is normal.   3. The mitral valve is normal in structure. Trivial mitral valve  regurgitation. No evidence of mitral stenosis.   4. The aortic valve is tricuspid. Aortic valve regurgitation is not  visualized. No aortic stenosis is present.   5. The inferior vena cava is normal in size with greater than 50%  respiratory variability, suggesting right atrial pressure of 3 mmHg.    Cath 07/18/20:   1st Diag lesion is 90% stenosed. Prox Cx lesion is 75% stenosed. Prox LAD-1 lesion is 100% stenosed. Prox LAD-2 lesion is 95% stenosed with 95% stenosed side branch in 1st  Diag. Post intervention, there is a 95% residual stenosis. Post intervention, there is a 95% residual stenosis. Post intervention, the side branch was reduced to 95% residual stenosis.   1. Acute occlusion of the proximal LAD at site of very complex bifurcation lesion. Successful restoration of antegrade flow with POBA into the first diagonal branch. Unable to cross the lesion in the LAD distal to the diagonal with a wire.    Plan: emergent CABG.    Diagnostic Dominance: Right     Intervention         TTE 07/17/20: IMPRESSIONS   1. Left ventricular ejection fraction, by estimation, is 55 to 60%. The  left ventricle has normal function. The left ventricle has no regional  wall motion abnormalities. There is moderate asymmetric left ventricular  hypertrophy. Left ventricular  diastolic parameters are consistent with Grade I diastolic dysfunction  (impaired relaxation).   2. Right ventricular  systolic function is normal. The right ventricular  size is normal.   3. The mitral valve is grossly normal. No evidence of mitral valve  regurgitation.   4. The aortic valve was not well visualized. Aortic valve regurgitation  is not visualized. No aortic stenosis is present.   Comparison(s): No prior Echocardiogram.   Conclusion(s)/Recommendation(s): Normal biventricular function without  evidence of hemodynamically significant valvular heart disease.   FINDINGS   Left Ventricle: Left ventricular ejection fraction, by estimation, is 55  to 60%. The left ventricle has normal function. The left ventricle has no  regional wall motion abnormalities. The left ventricular internal cavity  size was small. There is moderate   asymmetric left ventricular hypertrophy. Left ventricular diastolic  parameters are consistent with Grade I diastolic dysfunction (impaired  relaxation).   Right Ventricle: The right ventricular size is normal. No increase in  right ventricular wall thickness. Right ventricular systolic function is  normal.   Left Atrium: Left atrial size was normal in size.   Right Atrium: Right atrial size was normal in size.   Pericardium: There is no evidence of pericardial effusion.   Mitral Valve: The mitral valve is grossly normal. No evidence of mitral  valve regurgitation.   Tricuspid Valve: The tricuspid valve is grossly normal. Tricuspid valve  regurgitation is not demonstrated.   Aortic Valve: The aortic valve was not well visualized. Aortic valve  regurgitation is not visualized. No aortic stenosis is present.   Pulmonic Valve: The pulmonic valve was grossly normal. Pulmonic valve  regurgitation is not visualized.   Aorta: The aortic root and ascending aorta are structurally normal, with  no evidence of dilitation.   IAS/Shunts: The atrial septum is grossly normal.    07/18/20: CABG Op note: Procedure:   Emergent Median Sternotomy Extracorporeal  circulation 3.   Coronary artery bypass grafting x 3   Saphenous vein graft to the LAD Sequential SVG to diagonal and OM.     4.   Endoscopic vein harvest from the right leg  EKG:  EKG is ordered today.  The ekg ordered today demonstrates NSR 67 bpm with stable prior anterolateral infarct.  Recent Labs: 07/17/2020: TSH 2.699 07/19/2020: Magnesium 2.3 10/05/2020: BUN 12; Creatinine, Ser 0.95; Potassium 3.8; Sodium 139 10/18/2020: Hemoglobin 12.6; Platelets 355 11/18/2020: ALT 27  Recent Lipid Panel    Component Value Date/Time   CHOL 131 11/18/2020 0853   TRIG 153 (H) 11/18/2020 0853   HDL 38 (L) 11/18/2020 0853   CHOLHDL 3.4 11/18/2020 0853   CHOLHDL 6.4 07/17/2020 1015   VLDL 31  07/17/2020 1015   LDLCALC 67 11/18/2020 0853    Home Medications   Current Meds  Medication Sig   acetaminophen (TYLENOL) 325 MG tablet Take 325-650 mg by mouth every 6 (six) hours as needed for mild pain (or headaches).   aspirin 81 MG EC tablet Take 1 tablet (81 mg total) by mouth daily. Swallow whole.   atorvastatin (LIPITOR) 80 MG tablet Take 1 tablet (80 mg total) by mouth daily.   Cholecalciferol (VITAMIN D) 50 MCG (2000 UT) CAPS Take 1 capsule by mouth daily.   ferrous fumarate-b12-vitamic C-folic acid (FEROCON) capsule Take 1 capsule by mouth 2 (two) times daily.   metoprolol succinate (TOPROL-XL) 25 MG 24 hr tablet Take 0.5 tablets (12.5 mg total) by mouth daily.   nitroGLYCERIN (NITROSTAT) 0.4 MG SL tablet PLACE 1 TABLET (0.4 MG TOTAL) UNDER THE TONGUE EVERY FIVE MINUTES X 3 DOSES AS NEEDED FOR CHEST PAIN.   TART CHERRY PO Take 1 capsule by mouth daily.   ticagrelor (BRILINTA) 90 MG TABS tablet Take 1 tablet (90 mg total) by mouth 2 (two) times daily.     Review of Systems      All other systems reviewed and are otherwise negative except as noted above.  Physical Exam    VS:  BP 126/72   Pulse 67   Ht 4' 11.75" (1.518 m)   Wt 142 lb (64.4 kg)   BMI 27.97 kg/m  , BMI Body mass  index is 27.97 kg/m.  Wt Readings from Last 3 Encounters:  04/05/21 142 lb (64.4 kg)  12/14/20 142 lb 13.7 oz (64.8 kg)  11/18/20 139 lb 9.6 oz (63.3 kg)     GEN: Well nourished, well developed, in no acute distress. HEENT: normal. Neck: Supple, no JVD, carotid bruits, or masses. Cardiac: RRR, no murmurs, rubs, or gallops. No clubbing, cyanosis, edema.  Radials/PT 2+ and equal bilaterally.  Respiratory:  Respirations regular and unlabored, clear to auscultation bilaterally. GI: Soft, nontender, nondistended. MS: No deformity or atrophy. Skin: Warm and dry, no rash. Neuro:  Strength and sensation are intact. Psych: Normal affect.  Assessment & Plan    CAD -s/p CABG with subsequent stenting.  GDMT includes aspirin, Brilinta, atorvastatin, as needed nitroglycerin.  Previously intolerant of Imdur.  Recommended for DAPT for at least 12 months from 10/03/2020.  We discussed that if the ends.  Can consider reduced dose versus discontinuation of Brilinta.  She reports mild bruising which is overall not bothersome. Heart healthy diet and regular cardiovascular exercise encouraged.    HLD, LDL goal <70 -LDL at goal by most recent labs.  Continue atorvastatin 80 mg daily.  Denies myalgias.  Gout -previous wrist cath resolved with short course of colchicine.  Will provide as needed colchicine prescription.  Encouraged to establish with primary care provider and provided contact information.  Disposition: Follow up in 5 month(s) with Dr. Shari Prows or APP.  Signed, Alver Sorrow, NP 04/05/2021, 2:13 PM [facility]

## 2021-04-05 NOTE — Patient Instructions (Addendum)
Medication Instructions:  Continue your current medications.   We have sent a prescription for as-need colchicine.   *If you need a refill on your cardiac medications before your next appointment, please call your pharmacy*   Lab Work: None ordered today.   Testing/Procedures: Your EKG was stable compared to previous.    Follow-Up: At [facility], you and your health needs are our priority.  As part of our continuing mission to provide you with exceptional heart care, we have created designated Provider Care Teams.  These Care Teams include your primary Cardiologist (physician) and Advanced Practice Providers (APPs -  Physician Assistants and Nurse Practitioners) who all work together to provide you with the care you need, when you need it.  We recommend signing up for the patient portal called "MyChart".  Sign up information is provided on this After Visit Summary.  MyChart is used to connect with patients for Virtual Visits (Telemedicine).  Patients are able to view lab/test results, encounter notes, upcoming appointments, etc.  Non-urgent messages can be sent to your provider as well.   To learn more about what you can do with MyChart, go to [email or web address].    Your next appointment:   As scheduled with Dr. Shari Prows   Other Instructions  Primary Care at [facility]: [address], [place] [address] Main Line: [phone]

## 2021-05-05 ENCOUNTER — Encounter (HOSPITAL_BASED_OUTPATIENT_CLINIC_OR_DEPARTMENT_OTHER): Payer: Self-pay

## 2021-05-07 ENCOUNTER — Emergency Department (HOSPITAL_COMMUNITY): Payer: Medicare HMO

## 2021-05-07 ENCOUNTER — Emergency Department (HOSPITAL_COMMUNITY)
Admission: EM | Admit: 2021-05-07 | Discharge: 2021-05-07 | Disposition: A | Discharge status: Home / Self Care | Payer: Medicare HMO | Attending: Emergency Medicine | Admitting: Emergency Medicine

## 2021-05-07 DIAGNOSIS — R1011 Right upper quadrant pain: Secondary | ICD-10-CM | POA: Diagnosis not present

## 2021-05-07 DIAGNOSIS — R110 Nausea: Secondary | ICD-10-CM | POA: Diagnosis not present

## 2021-05-07 DIAGNOSIS — I2510 Atherosclerotic heart disease of native coronary artery without angina pectoris: Secondary | ICD-10-CM | POA: Diagnosis not present

## 2021-05-07 DIAGNOSIS — N3289 Other specified disorders of bladder: Secondary | ICD-10-CM | POA: Diagnosis not present

## 2021-05-07 DIAGNOSIS — K449 Diaphragmatic hernia without obstruction or gangrene: Secondary | ICD-10-CM | POA: Diagnosis not present

## 2021-05-07 DIAGNOSIS — R109 Unspecified abdominal pain: Secondary | ICD-10-CM | POA: Diagnosis not present

## 2021-05-07 DIAGNOSIS — K5730 Diverticulosis of large intestine without perforation or abscess without bleeding: Secondary | ICD-10-CM | POA: Diagnosis not present

## 2021-05-07 DIAGNOSIS — R112 Nausea with vomiting, unspecified: Secondary | ICD-10-CM | POA: Insufficient documentation

## 2021-05-07 DIAGNOSIS — N281 Cyst of kidney, acquired: Secondary | ICD-10-CM | POA: Diagnosis not present

## 2021-05-07 DIAGNOSIS — I10 Essential (primary) hypertension: Secondary | ICD-10-CM | POA: Diagnosis not present

## 2021-05-07 DIAGNOSIS — Z79899 Other long term (current) drug therapy: Secondary | ICD-10-CM | POA: Insufficient documentation

## 2021-05-07 DIAGNOSIS — I700 Atherosclerosis of aorta: Secondary | ICD-10-CM | POA: Diagnosis not present

## 2021-05-07 DIAGNOSIS — R079 Chest pain, unspecified: Secondary | ICD-10-CM | POA: Diagnosis not present

## 2021-05-07 DIAGNOSIS — R918 Other nonspecific abnormal finding of lung field: Secondary | ICD-10-CM | POA: Diagnosis not present

## 2021-05-07 DIAGNOSIS — R1013 Epigastric pain: Secondary | ICD-10-CM | POA: Diagnosis present

## 2021-05-07 DIAGNOSIS — R1084 Generalized abdominal pain: Secondary | ICD-10-CM | POA: Diagnosis not present

## 2021-05-07 LAB — COMPREHENSIVE METABOLIC PANEL
ALT: 35 U/L (ref 0–44)
AST: 47 U/L — ABNORMAL HIGH (ref 15–41)
Albumin: 3.9 g/dL (ref 3.5–5.0)
Alkaline Phosphatase: 93 U/L (ref 38–126)
Anion gap: 10 (ref 5–15)
BUN: 17 mg/dL (ref 8–23)
CO2: 21 mmol/L — ABNORMAL LOW (ref 22–32)
Calcium: 8.9 mg/dL (ref 8.9–10.3)
Chloride: 107 mmol/L (ref 98–111)
Creatinine, Ser: 0.93 mg/dL (ref 0.44–1.00)
GFR, Estimated: >60 mL/min (ref >60)
Glucose, Bld: 98 mg/dL (ref 70–99)
Potassium: 4.5 mmol/L (ref 3.5–5.1)
Sodium: 138 mmol/L (ref 135–145)
Total Bilirubin: 1 mg/dL (ref 0.3–1.2)
Total Protein: 6.9 g/dL (ref 6.5–8.1)

## 2021-05-07 LAB — CBC
HCT: 46.3 % — ABNORMAL HIGH (ref 36.0–46.0)
Hemoglobin: 14.7 g/dL (ref 12.0–15.0)
MCH: 30.6 pg (ref 26.0–34.0)
MCHC: 31.7 g/dL (ref 30.0–36.0)
MCV: 96.5 fL (ref 80.0–100.0)
Platelets: 197 10*3/uL (ref 150–400)
RBC: 4.8 MIL/uL (ref 3.87–5.11)
RDW: 13.2 % (ref 11.5–15.5)
WBC: 9.1 10*3/uL (ref 4.0–10.5)
nRBC: 0.3 % — ABNORMAL HIGH (ref 0.0–0.2)

## 2021-05-07 LAB — TROPONIN I (HIGH SENSITIVITY)
Troponin I (High Sensitivity): 6 ng/L (ref <18)
Troponin I (High Sensitivity): 7 ng/L (ref <18)

## 2021-05-07 LAB — LIPASE, BLOOD: Lipase: 111 U/L — ABNORMAL HIGH (ref 11–51)

## 2021-05-07 MED ORDER — OXYCODONE HCL 5 MG PO TABS: 2.5000 mg | ORAL_TABLET | Freq: Four times a day (QID) | ORAL | 0 refills | Status: DC | PRN

## 2021-05-07 MED ORDER — METOCLOPRAMIDE HCL 10 MG PO TABS: 10.0000 mg | ORAL_TABLET | Freq: Four times a day (QID) | ORAL | 0 refills | Status: DC | PRN

## 2021-05-07 MED ORDER — IOHEXOL 350 MG/ML SOLN
100.0000 mL | Freq: Once | INTRAVENOUS | Status: AC | PRN
  Administered 2021-05-07: 100 mL via INTRAVENOUS

## 2021-05-07 MED ORDER — METOCLOPRAMIDE HCL 5 MG/ML IJ SOLN
10.0000 mg | Freq: Once | INTRAMUSCULAR | Status: AC
  Administered 2021-05-07: 10 mg via INTRAVENOUS
  Filled 2021-05-07: qty 2

## 2021-05-07 MED ORDER — MORPHINE SULFATE (PF) 4 MG/ML IV SOLN
4.0000 mg | Freq: Once | INTRAVENOUS | Status: AC
  Administered 2021-05-07: 4 mg via INTRAVENOUS
  Filled 2021-05-07: qty 1

## 2021-05-07 MED ORDER — FENTANYL CITRATE PF 50 MCG/ML IJ SOSY
50.0000 ug | PREFILLED_SYRINGE | Freq: Once | INTRAMUSCULAR | Status: DC
Start: 2021-05-07 — End: 2021-05-07
  Filled 2021-05-07: qty 1

## 2021-05-07 MED ORDER — SODIUM CHLORIDE 0.9 % IV BOLUS
1000.0000 mL | Freq: Once | INTRAVENOUS | Status: AC
  Administered 2021-05-07: 1000 mL via INTRAVENOUS

## 2021-05-07 MED ORDER — ONDANSETRON 4 MG PO TBDP
4.0000 mg | ORAL_TABLET | Freq: Once | ORAL | Status: AC
  Administered 2021-05-07: 4 mg via ORAL
  Filled 2021-05-07: qty 1

## 2021-05-07 NOTE — ED Triage Notes (Signed)
Pt bib [facility] c/o LUQ abd pain x2hrs, began  after lunch. Hx MI, advised pain "felt similar." Emesis x1 w EMS, no meds given en route.   144/80 HR 63

## 2021-05-07 NOTE — ED Provider Notes (Signed)
Emergency Medicine Provider Triage Evaluation Note  Brenda Wilkins , a 75 y.o. female  was evaluated in triage.  Pt complains of epigastric pain radiating to back since 1400 today.  Patient reports after an hour of onset of symptoms she was nauseous and had multiple episodes of vomiting.  She denies any chest pain or shortness of breath.  Denies any fevers.  Denies any new food or travel.  Patient reports this "feels like her last MI".  Patient recent CABG in January 2022.  Review of Systems  Positive: Epigastric Donnell pain, nausea, vomiting Negative: Chest pain, shortness of breath, diaphoresis  Physical Exam  BP (!) 133/120 (BP Location: Right Arm)   Pulse 60   Temp 97.7 F (36.5 C) (Oral)   Resp 14   SpO2 100%  Gen:   Awake, no distress  Resp:  Normal effort  MSK:   Moves extremities without difficulty  Other:  Decreased radial pulse on left.  Medical Decision Making  Medically screening exam initiated at 5:29 PM.  Appropriate orders placed.  Jazlin Tapscott was informed that the remainder of the evaluation will be completed by another provider, this initial triage assessment does not replace that evaluation, and the importance of remaining in the ED until their evaluation is complete.  Charge nurse made aware. Patient is next to room.    Achille Rich, PA-C 05/08/21 0019    Derwood Kaplan, MD 05/08/21 [phone]

## 2021-05-07 NOTE — ED Provider Notes (Signed)
[facility] EMERGENCY DEPARTMENT Provider Note   CSN: [id number] Arrival date & time: 05/07/21  1706     History Chief Complaint  Patient presents with   Abdominal Pain    Brenda Wilkins is a 75 y.o. female presents emergency department chief complaint of abdominal pain nausea and vomiting.  She has a past medical history of coronary artery disease status post CABG x3, history of unstable angina, hyperlipidemia and hypertension.  Patient states that she ate lunch around 2:00 and shortly thereafter began having pain in her epigastrium.  The pain became progressively worse and has been waxing and waning in nature but severe since that time.  Currently her pain is 8 out of 10.  Pain radiates toward her back and toward the left side.  She has had multiple episodes of vomiting and was given Zofran prior to my evaluation without relief of symptoms.  Patient has no previous history of abdominal surgeries.  She denies ever having pain like this before.  She denies fever, chills, cough, shortness of breath, diaphoresis.   Abdominal Pain Associated symptoms: nausea and vomiting   Associated symptoms: no chest pain, no chills, no cough, no dysuria, no fever and no shortness of breath       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    Patient Active Problem List   Diagnosis Date Noted   Left wrist pain 11/01/2020   Unstable angina (HCC) 10/02/2020   Coronary artery disease 08/09/2020   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   NSTEMI (non-ST elevated myocardial infarction) (HCC) 07/16/2020   Hyperlipidemia 07/16/2020    Past Surgical History:  Procedure Laterality Date   CARDIAC CATHETERIZATION  08/08/2020   CORONARY ARTERY BYPASS GRAFT N/A 07/18/2020   Procedure: CORONARY ARTERY BYPASS GRAFTING (CABG), ON PUMP, TIMES THREE, USING  ENDOSCOPICALLY HARVESTED RIGHT GREATER SAPHENOUS VEIN;  Surgeon: Alleen Borne, MD;  Location: [facility] OR;  Service: Open Heart Surgery;  Laterality: N/A;   CORONARY STENT INTERVENTION N/A 10/03/2020   Procedure: CORONARY STENT INTERVENTION;  Surgeon: Runell Gess, MD;  Location: [facility] INVASIVE CV LAB;  Service: Cardiovascular;  Laterality: N/A;   CORONARY/GRAFT ACUTE MI REVASCULARIZATION N/A 07/18/2020   Procedure: Coronary/Graft Acute MI Revascularization;  Surgeon: Swaziland, Peter M, MD;  Location: [facility] INVASIVE CV LAB;  Service: Cardiovascular;  Laterality: N/A;   INTRAVASCULAR PRESSURE WIRE/FFR STUDY N/A 10/03/2020   Procedure: INTRAVASCULAR PRESSURE WIRE/FFR STUDY;  Surgeon: Runell Gess, MD;  Location: [facility] INVASIVE CV LAB;  Service: Cardiovascular;  Laterality: N/A;   LEFT HEART CATH AND CORONARY ANGIOGRAPHY N/A 07/18/2020   Procedure: LEFT HEART CATH AND CORONARY ANGIOGRAPHY;  Surgeon: Tonny Bollman, MD;  Location: [facility] INVASIVE CV LAB;  Service: Cardiovascular;  Laterality: N/A;   LEFT HEART CATH AND CORS/GRAFTS ANGIOGRAPHY N/A 08/08/2020   Procedure: LEFT HEART CATH AND CORS/GRAFTS ANGIOGRAPHY;  Surgeon: Lennette Bihari, MD;  Location: [facility] INVASIVE CV LAB;  Service: Cardiovascular;  Laterality: N/A;   LEFT HEART CATH AND CORS/GRAFTS ANGIOGRAPHY N/A 10/03/2020   Procedure: LEFT HEART CATH AND CORS/GRAFTS ANGIOGRAPHY;  Surgeon: Runell Gess, MD;  Location: [facility] INVASIVE CV LAB;  Service: Cardiovascular;  Laterality: N/A;   TEE WITHOUT CARDIOVERSION N/A 07/18/2020   Procedure: TRANSESOPHAGEAL ECHOCARDIOGRAM (TEE);  Surgeon: Alleen Borne, MD;  Location: [facility] OR;  Service: Open Heart Surgery;  Laterality: N/A;     OB History   No obstetric history on file.     No family history on file.  Social History   Tobacco Use   Smoking status: Never   Smokeless tobacco: Never  Vaping Use   Vaping Use: Never used  Substance Use Topics   Alcohol use: Never   Drug use: Never    Home  Medications Prior to Admission medications   Medication Sig Start Date End Date Taking? Authorizing Provider  acetaminophen (TYLENOL) 325 MG tablet Take 325-650 mg by mouth every 6 (six) hours as needed for mild pain (or headaches).    [provider]  aspirin 81 MG EC tablet Take 1 tablet (81 mg total) by mouth daily. Swallow whole. 08/30/20   Meriam Sprague, MD  atorvastatin (LIPITOR) 80 MG tablet Take 1 tablet (80 mg total) by mouth daily. 08/23/20   Tonny Bollman, MD  Cholecalciferol (VITAMIN D) 50 MCG (2000 UT) CAPS Take 1 capsule by mouth daily.    [provider]  colchicine 0.6 MG tablet Take 1 tablet (0.6 mg total) by mouth as needed (As needed for gout flare). 04/05/21   Alver Sorrow, NP  ferrous fumarate-b12-vitamic C-folic acid (FEROCON) capsule Take 1 capsule by mouth 2 (two) times daily. 11/30/20   Meriam Sprague, MD  metoprolol succinate (TOPROL-XL) 25 MG 24 hr tablet Take 0.5 tablets (12.5 mg total) by mouth daily. 08/30/20   Meriam Sprague, MD  nitroGLYCERIN (NITROSTAT) 0.4 MG SL tablet PLACE 1 TABLET (0.4 MG TOTAL) UNDER THE TONGUE EVERY FIVE MINUTES X 3 DOSES AS NEEDED FOR CHEST PAIN. 08/09/20 08/09/21  Kroeger, Ovidio Kin., PA-C  TART CHERRY PO Take 1 capsule by mouth daily.    [provider]  ticagrelor (BRILINTA) 90 MG TABS tablet Take 1 tablet (90 mg total) by mouth 2 (two) times daily. 08/30/20   Meriam Sprague, MD    Allergies    Patient has no known allergies.  Review of Systems   Review of Systems  Constitutional:  Negative for chills and fever.  HENT:  Negative for congestion.   Respiratory:  Negative for cough and shortness of breath.   Cardiovascular:  Negative for chest pain.  Gastrointestinal:  Positive for abdominal pain, nausea and vomiting.  Genitourinary:  Negative for dysuria.  Musculoskeletal:  Positive for back pain. Negative for myalgias.  Skin:  Negative for rash.  Neurological:  Negative for  headaches.  Psychiatric/Behavioral:  The patient is nervous/anxious.    Physical Exam Updated Vital Signs BP (!) 131/100   Pulse 83   Temp 97.7 F (36.5 C) (Oral)   Resp 19   SpO2 93%   Physical Exam Vitals and nursing note reviewed.  Constitutional:      General: She is not in acute distress.    Appearance: She is well-developed. She is not diaphoretic.  HENT:     Head: Normocephalic and atraumatic.     Right Ear: External ear normal.     Left Ear: External ear normal.     Nose: Nose normal.     Mouth/Throat:     Mouth: Mucous membranes are moist.  Eyes:     General: No scleral icterus.    Conjunctiva/sclera: Conjunctivae normal.  Cardiovascular:     Rate and Rhythm: Normal rate and regular rhythm.     Heart sounds: Normal heart sounds. No murmur heard.   No friction rub. No gallop.  Pulmonary:     Effort:  Pulmonary effort is normal. No respiratory distress.     Breath sounds: Normal breath sounds.  Abdominal:     General: Bowel sounds are normal. There is no distension.     Palpations: Abdomen is soft. There is no mass.     Tenderness: There is abdominal tenderness in the right upper quadrant. There is guarding. There is no right CVA tenderness, left CVA tenderness or rebound. Positive signs include Murphy's sign.  Musculoskeletal:     Cervical back: Normal range of motion.  Skin:    General: Skin is warm and dry.  Neurological:     Mental Status: She is alert and oriented to person, place, and time.  Psychiatric:        Behavior: Behavior normal.    ED Results / Procedures / Treatments   Labs (all labs ordered are listed, but only abnormal results are displayed) Labs Reviewed  CBC  COMPREHENSIVE METABOLIC PANEL  LIPASE, BLOOD  URINALYSIS, ROUTINE W REFLEX MICROSCOPIC  TROPONIN I (HIGH SENSITIVITY)    EKG EKG Interpretation  Date/Time:  Sunday May 07 2021 17:21:27 EDT Ventricular Rate:  63 PR Interval:  168 QRS Duration: 88 QT  Interval:  406 QTC Calculation: 415 R Axis:   42 Text Interpretation: Normal sinus rhythm Low voltage QRS Septal infarct , age undetermined Abnormal ECG No significant change since last tracing Confirmed by Derwood Kaplan [phone]) on 05/07/2021 5:26:10 PM  Radiology No results found.  Procedures Procedures   Medications Ordered in ED Medications  sodium chloride 0.9 % bolus 1,000 mL (has no administration in time range)  fentaNYL (SUBLIMAZE) injection 50 mcg (has no administration in time range)  metoCLOPramide (REGLAN) injection 10 mg (has no administration in time range)  ondansetron (ZOFRAN-ODT) disintegrating tablet 4 mg (4 mg Oral Given 05/07/21 1736)    ED Course  I have reviewed the triage vital signs and the nursing notes.  Pertinent labs & imaging results that were available during my care of the patient were reviewed by me and considered in my medical decision making (see chart for details).  Clinical Course as of 05/08/21 1122  Sun May 07, 2021  2002 Patient currently pain free [AH]    Clinical Course User Index [AH] Arthor Captain, PA-C   MDM Rules/Calculators/A&P                           This is a 75 year old female with a past medical history of coronary artery disease who presents the emergency department with chief complaint of abdominal pain nausea and vomiting.  Patient had onset of postprandial abdominal pain suddenly just after eating lunch at about 2 PM.  Since that time she has had severe, progressively worsening abdominal pain with multiple episodes of nonbloody nonbilious vomitus no previous history of abdominal pain.  Differential diagnosis includes volvulus, perforated ulcer, aortic dissection, ACS, biliary colic, pancreatitis, portal vein thrombus, choledocholithiasis.    Patient was treated in the emergency department with morphine with total resolution of her pain along with Zofran which did little to relieve her emesis and Reglan which relieved  nausea and emesis.  I ordered and reviewed labs which included CBC without elevated white blood cell count, CMP with mildly low bicarb likely secondary to patient's vomiting and AST mildly elevated.  Lipase elevated at 111, troponin is negative x2. I ordered and reviewed images of a right upper quadrant abdominal ultrasound which shows no acute abnormalities, 2 view chest x-ray which also  shows no acute abnormalities and a CT angiogram of the chest abdomen and pelvis which shows some aortic atherosclerosis and no other acute findings.  Given the patient's work-up I do not feel she has emergent cause of symptoms including ACS dissection volvulus etc.  Her lipase is mildly elevated and this could represent recently passed gallstone causing gallstone pancreatitis.  Patient does not have any obvious findings of pancreatitis or cholecystitis on CT imaging.  Patient is currently pain-free and without nausea or emesis.  Will discharge with pain medication and antiemetics after PDMP review.  Patient is otherwise stable for discharge at this time.  She is instructed to follow closely with her primary care physician.   Final Clinical Impression(s) / ED Diagnoses Final diagnoses:  RUQ abdominal pain    Rx / DC Orders ED Discharge Orders     None        Arthor Captain, PA-C 05/08/21 1126    Gwyneth Sprout, MD 05/11/21 1757

## 2021-05-07 NOTE — ED Notes (Signed)
Taken to US by transporter. °

## 2021-05-07 NOTE — ED Notes (Signed)
E-signature pad unavailable at time of pt discharge. This RN discussed discharge materials with pt and answered all pt questions. Pt stated understanding of discharge material. ? ?

## 2021-05-07 NOTE — Discharge Instructions (Addendum)

## 2021-08-04 ENCOUNTER — Other Ambulatory Visit: Payer: Self-pay | Admitting: Cardiovascular Disease

## 2021-08-26 NOTE — Progress Notes (Signed)
Cardiology Office Note:    Date:  08/31/2021   ID:  Brenda Wilkins, DOB [birth year], MRN [id number]  PCP:  Patient, No Pcp Per (Inactive)   [place]  Cardiologist:  Freada Bergeron, MD  Advanced Practice Provider:  No care team member to display Electrophysiologist:  None   Referring MD: No ref. provider found     History of Present Illness:    Brenda Wilkins is a 76 y.o. female with a hx of with a hx of known CAD s/p CABG 08/05/21 with subsequent NSTEMIs (08/07/20 and 09/29/20) found to have multiple occluded grafts s/p PCI to SVG-Diag as detailed below and HLD who presents to clinic for follow-up.   The patient suffered from an NSTEMI in early December found to have multivessel CAD on cath 07/17/20 s/p 3V CABG on 07/18/20 (SVG to LAD, sequential SVG to Diag and OM).     Post-operative course was complicated by recurrent chest pain causing her to re-present to the ED on 08/07/20. In the ED, she was found to have elevated troponin consistent with NSTEMI. She underwent repeat cath, which revealed occluded SVG-LAD graft but TIMI 3 flow down LAD from SVG-Diag graft. Case was discussed with CV surgery and given brisk TIMI 3 flow down the LAD from the other bypass graft, the decision was made to manage medically.    She was doing well until 09/29/20 where she developed progressive chest pressure. She presented to [facility] on 10/02/20 and underwent coronary angiography where she had 95% stenosis of the graft before the 1st diag and 100% stenosis of the origin of the graft between the first Diag and OM2. A DES was placed at the origin of the diagonal branch SVG. Her post-cath course was complicated by a right groin hematoma and pseudoaneurysm. She was managed conservatively.   Called the office prior to her visit on 10/17/20 as she noted increased swelling in her groin with a palpable firm mass at prior groin access site. CBC was obtained which showed hemoglobin 12.6 which was  improved from 10.3 on discharge. Ultrasound consistent with hematoma but no evidence of pseudoaneurysm. During her visit, she was stable and tolerating her medications. Groin site with sizable firm hematoma which she had been monitoring and was not increasing.   Last seen in clinic by Laurann Montana on 03/2021 after having COVID. She was recovering well and was able to exercise without symptoms.  Today, the patient overall feels well. Back in 05/2021, she had an episode of severe abdominal pain and nausea. She took a nitro which did not help. She called EMS and was taken to the ER. Abdominal ultrasound of the gallbladder unremarkable. No evidence of pancreatitis on CT. Trop negative x2. Given reassuring work-up, she was sent home.  Pain has not recurred since that time. They suspect it was related to a gallstone that passed.   Otherwise, she is doing well and remains active. Trying to walk 38min/day. No chest pain or SOB. She continues to work from home which she enjoys. Tolerating her medications. No bleeding issues.    Past Medical History:  Diagnosis Date   Coronary artery disease    Hyperlipidemia    Hypertension    NSTEMI (non-ST elevated myocardial infarction) ([place]) 07/16/2020   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    Past Surgical History:  Procedure Laterality Date   CARDIAC CATHETERIZATION  08/08/2020   CORONARY ARTERY BYPASS GRAFT N/A 07/18/2020   Procedure: CORONARY ARTERY BYPASS GRAFTING (CABG), ON PUMP,  TIMES THREE, USING ENDOSCOPICALLY HARVESTED RIGHT GREATER SAPHENOUS VEIN;  Surgeon: Gaye Pollack, MD;  Location: [place];  Service: Open Heart Surgery;  Laterality: N/A;   CORONARY STENT INTERVENTION N/A 10/03/2020   Procedure: CORONARY STENT INTERVENTION;  Surgeon: Lorretta Harp, MD;  Location: [place] [facility];  Service: Cardiovascular;  Laterality: N/A;   CORONARY/GRAFT ACUTE MI REVASCULARIZATION N/A 07/18/2020   Procedure: Coronary/Graft Acute MI Revascularization;  Surgeon:  Martinique, Peter M, MD;  Location: [place] [facility];  Service: Cardiovascular;  Laterality: N/A;   INTRAVASCULAR PRESSURE WIRE/FFR STUDY N/A 10/03/2020   Procedure: INTRAVASCULAR PRESSURE WIRE/FFR STUDY;  Surgeon: Lorretta Harp, MD;  Location: [place] [facility];  Service: Cardiovascular;  Laterality: N/A;   LEFT HEART CATH AND CORONARY ANGIOGRAPHY N/A 07/18/2020   Procedure: LEFT HEART CATH AND CORONARY ANGIOGRAPHY;  Surgeon: Sherren Mocha, MD;  Location: [place] [facility];  Service: Cardiovascular;  Laterality: N/A;   LEFT HEART CATH AND CORS/GRAFTS ANGIOGRAPHY N/A 08/08/2020   Procedure: LEFT HEART CATH AND CORS/GRAFTS ANGIOGRAPHY;  Surgeon: Troy Sine, MD;  Location: [place] [facility];  Service: Cardiovascular;  Laterality: N/A;   LEFT HEART CATH AND CORS/GRAFTS ANGIOGRAPHY N/A 10/03/2020   Procedure: LEFT HEART CATH AND CORS/GRAFTS ANGIOGRAPHY;  Surgeon: Lorretta Harp, MD;  Location: [place] [facility];  Service: Cardiovascular;  Laterality: N/A;   TEE WITHOUT CARDIOVERSION N/A 07/18/2020   Procedure: TRANSESOPHAGEAL ECHOCARDIOGRAM (TEE);  Surgeon: Gaye Pollack, MD;  Location: [place];  Service: Open Heart Surgery;  Laterality: N/A;    Current Medications: Current Meds  Medication Sig   acetaminophen (TYLENOL) 325 MG tablet Take 325-650 mg by mouth every 6 (six) hours as needed for mild pain (or headaches).   aspirin 81 MG EC tablet Take 1 tablet (81 mg total) by mouth daily. Swallow whole.   atorvastatin (LIPITOR) 80 MG tablet TAKE 1 TABLET BY MOUTH EVERY DAY   Cholecalciferol (VITAMIN D) 50 MCG (2000 UT) CAPS Take 1 capsule by mouth daily.   colchicine 0.6 MG tablet Take 1 tablet (0.6 mg total) by mouth as needed (As needed for gout flare).   ferrous [id number] C-folic acid (FEROCON) capsule Take 1 capsule by mouth 2 (two) times daily.   metoCLOPramide (REGLAN) 10 MG tablet Take 1 tablet (10 mg total) by mouth every 6 (six) hours as needed for nausea  (nausea/headache).   metoprolol succinate (TOPROL-XL) 25 MG 24 hr tablet Take 0.5 tablets (12.5 mg total) by mouth daily.   nitroGLYCERIN (NITROSTAT) 0.4 MG SL tablet PLACE 1 TABLET (0.4 MG TOTAL) UNDER THE TONGUE EVERY FIVE MINUTES X 3 DOSES AS NEEDED FOR CHEST PAIN.   oxyCODONE (ROXICODONE) 5 MG immediate release tablet Take 0.5-1 tablets (2.5-5 mg total) by mouth every 6 (six) hours as needed for severe pain.   TART CHERRY PO Take 1 capsule by mouth daily.   ticagrelor (BRILINTA) 90 MG TABS tablet Take 1 tablet (90 mg total) by mouth 2 (two) times daily.     Allergies:   Patient has no known allergies.   Social History   Socioeconomic History   Marital status: Unknown    Spouse name: Not on file   Number of children: Not on file   Years of education: 12   Highest education level: Not on file  Occupational History   Not on file  Tobacco Use   Smoking status: Never   Smokeless tobacco: Never  Vaping Use   Vaping Use: Never used  Substance and Sexual Activity   Alcohol use: Never   Drug use: Never   Sexual activity: Not Currently  Other Topics Concern   Not on file  Social History Narrative   Not on file   Social Determinants of Health   Financial Resource Strain: Not on file  Food Insecurity: Not on file  Transportation Needs: Not on file  Physical Activity: Not on file  Stress: Not on file  Social Connections: Not on file     Family History: The patient's family history is not on file.  ROS:   Please see the history of present illness. All other systems reviewed and are negative. Review of Systems  Constitutional: Negative for unexpected weight change.  HENT: Negative for hearing loss and rhinorrhea.   Eyes: Negative for visual disturbance.  Respiratory: Negative for cough.   Cardiovascular: Negative for chest pain and leg swelling.  Gastrointestinal: Negative for nausea, vomiting, diarrhea and blood in stool.  Genitourinary: Negative for dysuria and  frequency.  Musculoskeletal: Negative for myalgias and arthralgias.  Skin: Negative for rash.  Neurological: Negative for headaches.  Hematological: Negative for adenopathy.  Psychiatric/Behavioral: Denies depression or anxiety    EKGs/Labs/Other Studies Reviewed:    The following studies were reviewed today: Ultrasound 10/18/20: Summary:  The avascular inhomogeneous structure noted in the right groin has  characteristics of a hematoma, measuring 6.7 cm in length and 4.4 cm in  width.    [place] PSA 10/04/20: A mixed echogenic structure measuring approximately 4.1 cm x 1.8 cm is  visualized at the right groin with ultrasound characteristics of a  hematoma.    ____________________   Left heart cath 10/03/20: Origin lesion is 100% stenosed. Ost Cx to Prox Cx lesion is 80% stenosed. Ost LAD to Mid LAD lesion is 100% stenosed. Origin to Prox Graft lesion before 1st Diag is 95% stenosed. Origin to Prox Graft lesion between 1st Diag and 3rd Mrg is 100% stenosed. A drug-eluting stent was successfully placed using a STENT RESOLUTE ONYX 4.5X22. Post intervention, there is a 10% residual stenosis. The left ventricular ejection fraction is 50-55% by visual estimate. The left ventricular systolic function is normal. LV end diastolic pressure is normal.   IMPRESSION: Successful PCI drug-eluting stenting of the origin of the diagonal branch SVG with occlusion of the continuation of the diagonal to the obtuse marginal branch.  The DFR of the proximal native circumflex was 0.99 suggesting this was not physiologically significant.  There was dampening at the origin of the dominant RCA which had been demonstrated in several prior cardiac cath but no obstructive disease was noted.  The sheath was secured in place.  The patient was already on aspirin and ticagrelor.  The sheath will be removed once ACT falls below 170 pressure held.  Patient will be hydrated overnight, discharged home in the morning and  follow-up with Dr. Johney Frame.` _____________   Echo 08/08/20:  1. Septal hypokinesis consistent with post-operative state. Left  ventricular ejection fraction, by estimation, is 50 to 55%. The left  ventricle has low normal function. The left ventricle demonstrates  regional wall motion abnormalities (see scoring  diagram/findings for description). Left ventricular diastolic parameters  are consistent with Grade I diastolic dysfunction (impaired relaxation).   2. Right ventricular systolic function is normal. The right ventricular  size is normal.   3. The mitral valve is normal in structure. Trivial mitral valve  regurgitation. No evidence of mitral stenosis.   4. The aortic valve is tricuspid. Aortic valve  regurgitation is not  visualized. No aortic stenosis is present.   5. The inferior vena cava is normal in size with greater than 50%  respiratory variability, suggesting right atrial pressure of 3 mmHg.    Cath 08/09/19: Prox Cx lesion is 80% stenosed. Prox LAD to Mid LAD lesion is 100% stenosed. Mid LAD lesion is 80% stenosed. Origin to Prox Graft lesion is 100% stenosed. Ost RCA lesion is 40% stenosed. There is mild to moderate left ventricular systolic dysfunction. LV end diastolic pressure is mildly elevated.   Mild LV dysfunction with mild anterolateral hypocontractility, EF estimate at 40% with LVEDP at 20 mmHg.   Significant native CAD with total occlusion of the LAD immediately proximal to the takeoff of the diagonal vessel.   Normal bifurcating ramus intermediate vessel.   Large left circumflex coronary artery with 80% proximal stenosis and competitive filling of a large OM 3 vessel.   Smooth ostial narrowing of the RCA of approximately 30 to 40%.  However with catheter engagement there was significant catheter dampening which improved following IC nitroglycerin administration.   Occluded SVG which had supplied the LAD.   Patent sequential SVG supplying the diagonal  vessel which then fills the LAD with previously noted 80% stenosis on an angle in the LAD immediately after the diagonal takeoff, and sequential graft supplying a large OM 3 vessel of the left circumflex coronary artery.   RECOMMENDATION: The patient's non-ST segment elevation myocardial infarction most likely is due to acute occlusion of the vein graft which had supplied the LAD.  This graft is 15 weeks old.  I suspect this occlusion may have contributed by the fact that the LAD is supplied by the diagonal vessel which is grafted and there is TIMI-3 flow down the LAD system.  There continues to be a high-grade 80+ percent stenosis on an angle in the LAD arising from the diagonal vessel which 3 weeks ago was not able to be entered with a wire during attempted acute angioplasty.  Since the patient is pain-free and there is a high likelihood of thrombus in the vein graft which has occluded plan initial aggressive medical management.  Will initiate DAPT with aspirin/Brilinta.  Continue beta-blocker therapy, initiate nitrate therapy, and continue aggressive lipid-lowering therapy.  If patient continues to have symptoms, consider addition of ranolazine and possible future intervention to the native LAD after an aggressive medical therapy trial.    Diagnostic Dominance: Right      Intervention     TTE 08/08/20: IMPRESSIONS   1. Septal hypokinesis consistent with post-operative state. Left  ventricular ejection fraction, by estimation, is 50 to 55%. The left  ventricle has low normal function. The left ventricle demonstrates  regional wall motion abnormalities (see scoring  diagram/findings for description). Left ventricular diastolic parameters  are consistent with Grade I diastolic dysfunction (impaired relaxation).   2. Right ventricular systolic function is normal. The right ventricular  size is normal.   3. The mitral valve is normal in structure. Trivial mitral valve  regurgitation. No evidence  of mitral stenosis.   4. The aortic valve is tricuspid. Aortic valve regurgitation is not  visualized. No aortic stenosis is present.   5. The inferior vena cava is normal in size with greater than 50%  respiratory variability, suggesting right atrial pressure of 3 mmHg.    Cath 07/18/20:   1st Diag lesion is 90% stenosed. Prox Cx lesion is 75% stenosed. Prox LAD-1 lesion is 100% stenosed. Prox LAD-2 lesion is 95%  stenosed with 95% stenosed side branch in 1st Diag. Post intervention, there is a 95% residual stenosis. Post intervention, there is a 95% residual stenosis. Post intervention, the side branch was reduced to 95% residual stenosis.   1. Acute occlusion of the proximal LAD at site of very complex bifurcation lesion. Successful restoration of antegrade flow with POBA into the first diagonal branch. Unable to cross the lesion in the LAD distal to the diagonal with a wire.    Plan: emergent CABG.    Diagnostic Dominance: Right      Intervention          TTE 07/17/20: IMPRESSIONS   1. Left ventricular ejection fraction, by estimation, is 55 to 60%. The  left ventricle has normal function. The left ventricle has no regional  wall motion abnormalities. There is moderate asymmetric left ventricular  hypertrophy. Left ventricular  diastolic parameters are consistent with Grade I diastolic dysfunction  (impaired relaxation).   2. Right ventricular systolic function is normal. The right ventricular  size is normal.   3. The mitral valve is grossly normal. No evidence of mitral valve  regurgitation.   4. The aortic valve was not well visualized. Aortic valve regurgitation  is not visualized. No aortic stenosis is present.   Comparison(s): No prior Echocardiogram.   Conclusion(s)/Recommendation(s): Normal biventricular function without  evidence of hemodynamically significant valvular heart disease.   FINDINGS   Left Ventricle: Left ventricular ejection fraction, by  estimation, is 55  to 60%. The left ventricle has normal function. The left ventricle has no  regional wall motion abnormalities. The left ventricular internal cavity  size was small. There is moderate   asymmetric left ventricular hypertrophy. Left ventricular diastolic  parameters are consistent with Grade I diastolic dysfunction (impaired  relaxation).   Right Ventricle: The right ventricular size is normal. No increase in  right ventricular wall thickness. Right ventricular systolic function is  normal.   Left Atrium: Left atrial size was normal in size.   Right Atrium: Right atrial size was normal in size.   Pericardium: There is no evidence of pericardial effusion.   Mitral Valve: The mitral valve is grossly normal. No evidence of mitral  valve regurgitation.   Tricuspid Valve: The tricuspid valve is grossly normal. Tricuspid valve  regurgitation is not demonstrated.   Aortic Valve: The aortic valve was not well visualized. Aortic valve  regurgitation is not visualized. No aortic stenosis is present.   Pulmonic Valve: The pulmonic valve was grossly normal. Pulmonic valve  regurgitation is not visualized.   Aorta: The aortic root and ascending aorta are structurally normal, with  no evidence of dilitation.   IAS/Shunts: The atrial septum is grossly normal.    07/18/20: CABG Op note: Procedure:   Emergent Median Sternotomy Extracorporeal circulation 3.   Coronary artery bypass grafting x 3   Saphenous vein graft to the LAD Sequential SVG to diagonal and OM.     4.   Endoscopic vein harvest from the right leg  EKG:  EKG was not ordered today.    Recent Labs: 05/07/2021: ALT 35; BUN 17; Creatinine, Ser 0.93; Hemoglobin 14.7; Platelets 197; Potassium 4.5; Sodium 138  Recent Lipid Panel    Component Value Date/Time   CHOL 131 11/18/2020 0853   TRIG 153 (H) 11/18/2020 0853   HDL 38 (L) 11/18/2020 0853   CHOLHDL 3.4 11/18/2020 0853   CHOLHDL 6.4 07/17/2020 1015    VLDL 31 07/17/2020 1015   LDLCALC 67 11/18/2020 0853  Physical Exam:    VS:  BP 122/60    Pulse 75    Ht 4' 11.75" (1.518 m)    Wt 143 lb 12.8 oz (65.2 kg)    SpO2 99%    BMI 28.32 kg/m     Wt Readings from Last 3 Encounters:  08/31/21 143 lb 12.8 oz (65.2 kg)  04/05/21 142 lb (64.4 kg)  12/14/20 142 lb 13.7 oz (64.8 kg)     GEN: Well nourished, well developed in no acute distress HEENT: Normal NECK: No JVD; No carotid bruits LYMPHATICS: No lymphadenopathy CARDIAC: RRR, no murmurs, rubs, gallops RESPIRATORY:  Clear to auscultation without rales, wheezing or rhonchi  ABDOMEN: Soft, non-tender, non-distended MUSCULOSKELETAL:  No edema; No deformity  SKIN: Warm and dry NEUROLOGIC:  Alert and oriented x 3 PSYCHIATRIC:  Normal affect   ASSESSMENT:    1. Coronary artery disease of native artery of native heart with stable angina pectoris ([place])   2. Hyperlipidemia LDL goal <70   3. S/P CABG x 3   4. S/P angioplasty with stent   5. NSTEMI (non-ST elevated myocardial infarction) [facility])     PLAN:    In order of problems listed above:  #Multivessel CAD s/p CABG on 07/18/20 with SVG to LAD, sequential SVG to Diag and OM #History of NSTEMI s/p PCI to SVG-D1 Graft: Patient with initial admission for NSTEMI found to have multivessel CAD on cath on 07/17/20 prompting emergent CABG on 07/18/20 as detailed above. LIMA deemed not a suitable bypass conduit due to small size. TTE on 07/17/20 with preserved LVEF 55-60%, no WMA, normal RV function. Post-operatively, she required inotropic support initially but was weaned off and was able to go home on 07/25/20. She was doing well until 01/02 when she developed acute onset back pain radiating to her chest. She presented to the ED where trop up-trended from 30-->500 and ECG with new TWI in the lateral leads (chronic q waves in V1-V3). Underwent coronary angiography which revealed occluded SVG-LAD graft. Suspect occlusion may have contributed  by the fact that the LAD is supplied by the diagonal vessel which is grafted and there is TIMI-3 flow down the LAD system. Given concern for significant thrombus in the SVG-LAD graft and TIMI 3 flow down the LAD from SVG-Diag graft, the decision was made to pursue medical management. She was doing well until 09/29/20 when she developed progressive angina that required readmission on 10/02/20. Underwent coronary angiography on 10/03/20 where the SVG-D1 graft was 95% stenosed proximally and the jump graft to the OM3 was 100% occluded. She ultimately underwent PCI to the SVG-D1. Now presenting for follow-up. -Continue aggressive medical management with ASA, brilinta for at least 12 months; plan to drop the ASA at 1 year and can eventually transition to plavix monotherapy -Did not tolerate imdur due to hypotension and no active chest pain; can consider ranexa if angina returns -Continue atorvastatin 80mg  daily -Continue metop 25mg  XL daily -Continue cardiac rehab   #HLD: -Continue atorvastatin 80mg  daily -Check lipids and LFTs next week when fasting -Goal LDL<55      Medication Adjustments/Labs and Tests Ordered: Current medicines are reviewed at length with the patient today.  Concerns regarding medicines are outlined above.  Orders Placed This Encounter  Procedures   Lipid Profile   Hepatic function panel   No orders of the defined types were placed in this encounter.   Patient Instructions  Medication Instructions:   Your physician recommends that you continue on your current  medications as directed. Please refer to the Current Medication list given to you today.  *If you need a refill on your cardiac medications before your next appointment, please call your pharmacy*   Lab Work:  NEXT WEEK [place] LFTs--PLEASE COME FASTING TO THIS LAB APPOINTMENT  If you have labs (blood work) drawn today and your tests are completely normal, you will receive your results only  by: [place] (if you have MyChart) OR A paper copy in the mail If you have any lab test that is abnormal or we need to change your treatment, we will call you to review the results.   Follow-Up: At [facility], you and your health needs are our priority.  As part of our continuing mission to provide you with exceptional heart care, we have created designated Provider Care Teams.  These Care Teams include your primary Cardiologist (physician) and Advanced Practice Providers (APPs -  Physician Assistants and Nurse Practitioners) who all work together to provide you with the care you need, when you need it.  We recommend signing up for the patient portal called "MyChart".  Sign up information is provided on this After Visit Summary.  MyChart is used to connect with patients for Virtual Visits (Telemedicine).  Patients are able to view lab/test results, encounter notes, upcoming appointments, etc.  Non-urgent messages can be sent to your provider as well.   To learn more about what you can do with MyChart, go to [email or web address].    Your next appointment:   6 month(s)  The format for your next appointment:   In Person  Provider:   Freada Bergeron, MD {        Signed, Freada Bergeron, MD  08/31/2021 10:37 AM    [place]

## 2021-08-31 ENCOUNTER — Ambulatory Visit: Payer: Medicare HMO | Admitting: Cardiology

## 2021-08-31 ENCOUNTER — Encounter: Payer: Self-pay | Admitting: Cardiology

## 2021-08-31 ENCOUNTER — Other Ambulatory Visit: Payer: Self-pay

## 2021-08-31 VITALS — BP 122/60 | HR 75 | Ht 59.75 in | Wt 143.8 lb

## 2021-08-31 DIAGNOSIS — E785 Hyperlipidemia, unspecified: Secondary | ICD-10-CM

## 2021-08-31 DIAGNOSIS — Z95820 Peripheral vascular angioplasty status with implants and grafts: Secondary | ICD-10-CM

## 2021-08-31 DIAGNOSIS — I214 Non-ST elevation (NSTEMI) myocardial infarction: Secondary | ICD-10-CM

## 2021-08-31 DIAGNOSIS — I25118 Atherosclerotic heart disease of native coronary artery with other forms of angina pectoris: Secondary | ICD-10-CM | POA: Diagnosis not present

## 2021-08-31 DIAGNOSIS — Z951 Presence of aortocoronary bypass graft: Secondary | ICD-10-CM | POA: Diagnosis not present

## 2021-08-31 NOTE — Patient Instructions (Signed)
Medication Instructions:   Your physician recommends that you continue on your current medications as directed. Please refer to the Current Medication list given to you today.  *If you need a refill on your cardiac medications before your next appointment, please call your pharmacy*   Lab Work:  NEXT WEEK SOMETIME--CHECK LIPIDS AND LFTs--PLEASE COME FASTING TO THIS LAB APPOINTMENT  If you have labs (blood work) drawn today and your tests are completely normal, you will receive your results only by: MyChart Message (if you have MyChart) OR A paper copy in the mail If you have any lab test that is abnormal or we need to change your treatment, we will call you to review the results.   Follow-Up: At [facility], you and your health needs are our priority.  As part of our continuing mission to provide you with exceptional heart care, we have created designated Provider Care Teams.  These Care Teams include your primary Cardiologist (physician) and Advanced Practice Providers (APPs -  Physician Assistants and Nurse Practitioners) who all work together to provide you with the care you need, when you need it.  We recommend signing up for the patient portal called "MyChart".  Sign up information is provided on this After Visit Summary.  MyChart is used to connect with patients for Virtual Visits (Telemedicine).  Patients are able to view lab/test results, encounter notes, upcoming appointments, etc.  Non-urgent messages can be sent to your provider as well.   To learn more about what you can do with MyChart, go to [email or web address].    Your next appointment:   6 month(s)  The format for your next appointment:   In Person  Provider:   Meriam Sprague, MD {

## 2021-09-08 ENCOUNTER — Other Ambulatory Visit: Payer: Self-pay

## 2021-09-08 ENCOUNTER — Other Ambulatory Visit: Payer: Medicare HMO | Admitting: *Deleted

## 2021-09-08 DIAGNOSIS — Z951 Presence of aortocoronary bypass graft: Secondary | ICD-10-CM | POA: Diagnosis not present

## 2021-09-08 DIAGNOSIS — I25118 Atherosclerotic heart disease of native coronary artery with other forms of angina pectoris: Secondary | ICD-10-CM | POA: Diagnosis not present

## 2021-09-08 DIAGNOSIS — E785 Hyperlipidemia, unspecified: Secondary | ICD-10-CM

## 2021-09-08 DIAGNOSIS — Z95820 Peripheral vascular angioplasty status with implants and grafts: Secondary | ICD-10-CM | POA: Diagnosis not present

## 2021-09-08 LAB — HEPATIC FUNCTION PANEL
ALT: 31 IU/L (ref 0–32)
AST: 27 IU/L (ref 0–40)
Albumin: 4.4 g/dL (ref 3.7–4.7)
Alkaline Phosphatase: 136 IU/L — ABNORMAL HIGH (ref 44–121)
Bilirubin Total: 0.4 mg/dL (ref 0.0–1.2)
Bilirubin, Direct: 0.14 mg/dL (ref 0.00–0.40)
Total Protein: 6.5 g/dL (ref 6.0–8.5)

## 2021-09-08 LAB — LIPID PANEL
Chol/HDL Ratio: 3 ratio (ref 0.0–4.4)
Cholesterol, Total: 124 mg/dL (ref 100–199)
HDL: 42 mg/dL (ref >39)
LDL Chol Calc (NIH): 64 mg/dL (ref 0–99)
Triglycerides: 98 mg/dL (ref 0–149)
VLDL Cholesterol Cal: 18 mg/dL (ref 5–40)

## 2021-09-11 ENCOUNTER — Telehealth: Payer: Self-pay | Admitting: *Deleted

## 2021-09-11 DIAGNOSIS — I25118 Atherosclerotic heart disease of native coronary artery with other forms of angina pectoris: Secondary | ICD-10-CM

## 2021-09-11 DIAGNOSIS — Z79899 Other long term (current) drug therapy: Secondary | ICD-10-CM

## 2021-09-11 DIAGNOSIS — E785 Hyperlipidemia, unspecified: Secondary | ICD-10-CM

## 2021-09-11 DIAGNOSIS — Z951 Presence of aortocoronary bypass graft: Secondary | ICD-10-CM

## 2021-09-11 MED ORDER — EZETIMIBE 10 MG PO TABS: 10.0000 mg | ORAL_TABLET | Freq: Every day | ORAL | 2 refills | Status: DC

## 2021-09-11 NOTE — Telephone Encounter (Signed)
The patient has been notified of the result and verbalized understanding.  All questions (if any) were answered.  Pt states she will start taking zetia 10 mg po daily along with her other med regimens. Confirmed the pharmacy of choice with the pt. Scheduled her to come in for repeat lipids in 6-8 weeks on 11/10/21, for pt states she cannot attend until that day.  She is aware to come fasting to this lab appt.  Pt states she is currently seeking out a new PCP, and has someone in mind, and states she will be giving them a call soon to arrange her new pt appt with them. Pt verbalized understanding and agrees with this plan.

## 2021-09-11 NOTE — Telephone Encounter (Signed)
-----   Message from Meriam Sprague, MD sent at 09/10/2021  9:31 AM EST ----- Her cholesterol is just a little above where we want her. The goal LDL is <55 for her given the extent of her disease. If she is okay with it, I would like to start her on zetia 10mg  daily and repeat lipids in 6-8 weeks. Otherwise, her alkaline phosphatase which is one of her liver function tests but is also in the bones as well, is just a little elevated. She can just follow-up with her primary care about this. Fortunately, she recently had abdominal imaging and everything looked okay.

## 2021-09-13 ENCOUNTER — Encounter: Payer: Self-pay | Admitting: Cardiology

## 2021-09-13 NOTE — Telephone Encounter (Signed)
Pt was given colchicine for gout, by Gillian Shields NP at last OV on 04/05/22.  Pt was advised by Luther Parody at that time to establish with a PCP for further management of this.  Pt is now seeking advisement about colchicine regimen and how to take this for gout flare-up. Will route this message to Dr. Shari Prows to advise on this matter, or to have pt establish with PCP about for further management.

## 2021-10-23 IMAGING — DX DG CHEST 1V PORT
1 series · 1 of 1 positions shown · non-contrast
Comparison: 07/18/2020

CLINICAL DATA: Intubated.  Status post CABG.

EXAM:
PORTABLE CHEST 1 VIEW

[chest ap]
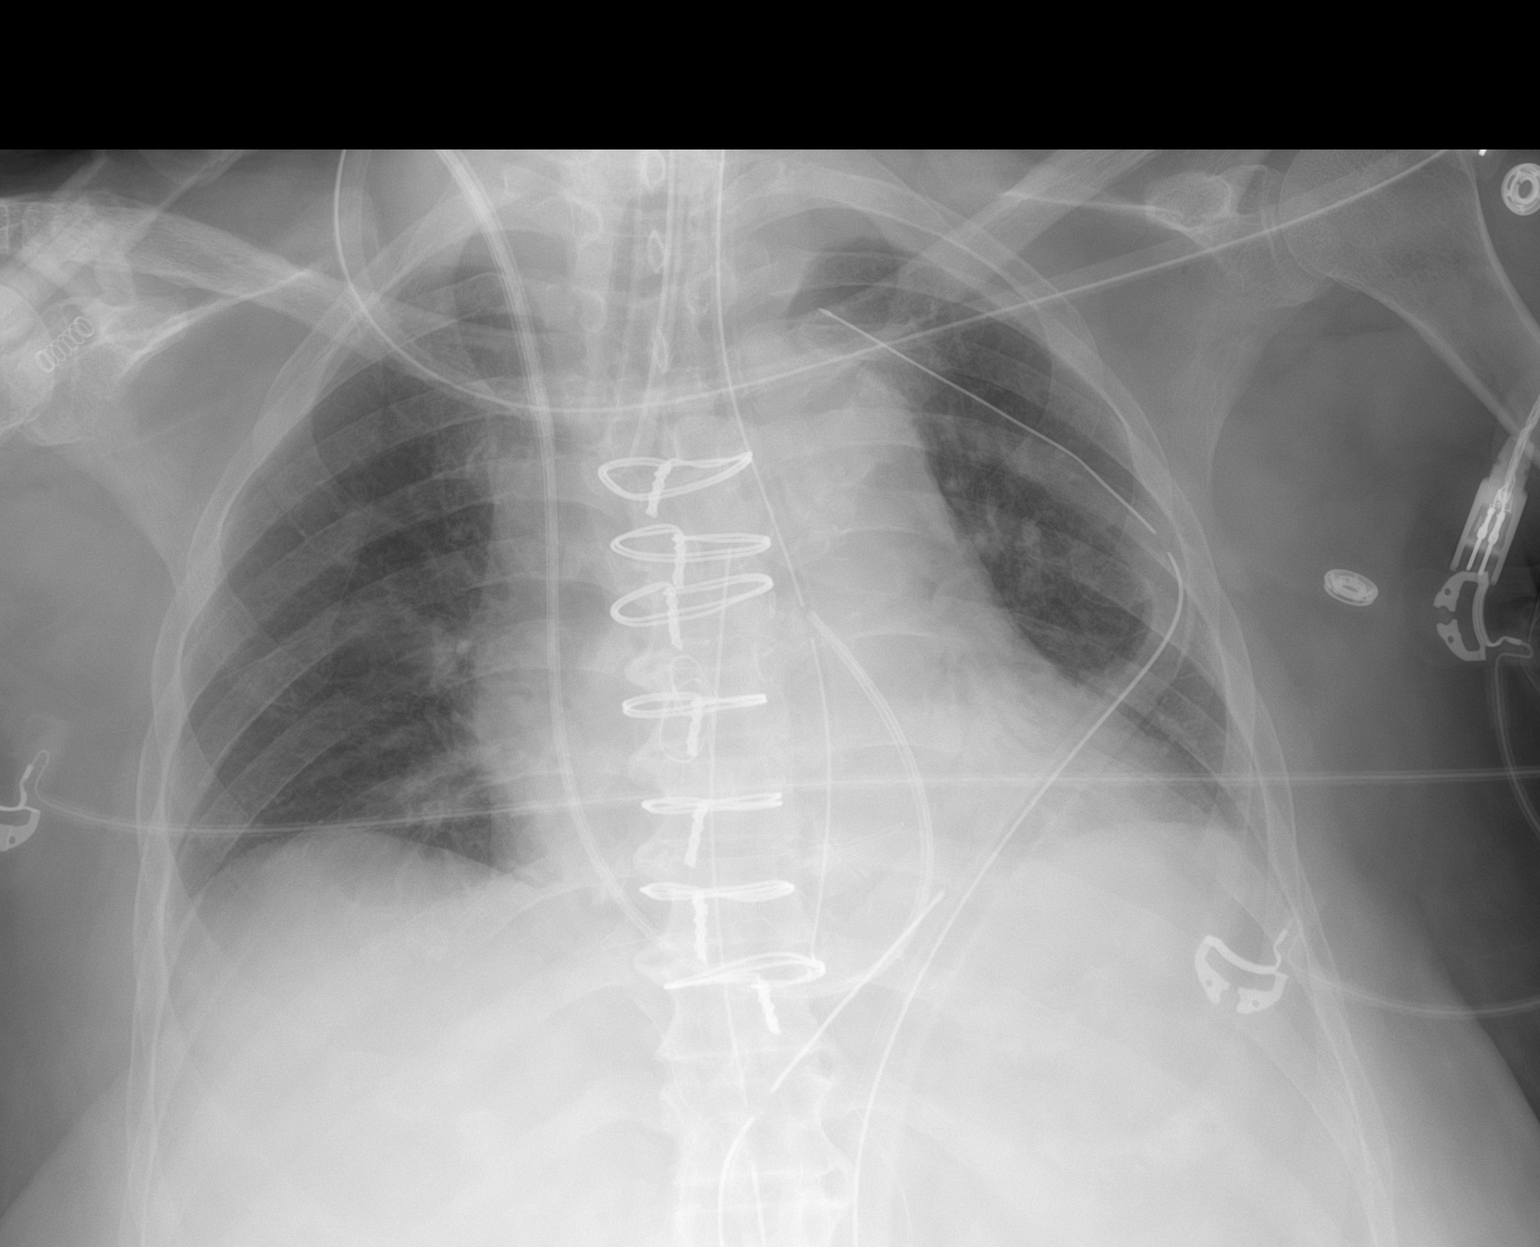

[1 of 1 positions shown; findings below may reference images not displayed]

FINDINGS: Sequelae of CABG are again identified. Endotracheal tube terminates
just below the level of the clavicles, unchanged. An enteric tube
terminates at the level of the GE junction, unchanged. A left chest
tube and mediastinal drains remain in place. A right jugular
Swan-Ganz catheter terminates over the main pulmonary artery,
unchanged. Widened appearance of the mediastinum is unchanged as is
asymmetric mild pleural/extrapleural density at the left lung apex.
Left greater than right basilar lung opacities are unchanged. No
sizable pleural effusion or pneumothorax is identified.
IMPRESSION: 1. Unchanged support devices.  No pneumothorax.
2. Unchanged bibasilar atelectasis, likely atelectasis.

## 2021-10-24 IMAGING — DX DG CHEST 1V PORT
1 series · 1 of 1 positions shown · non-contrast
Comparison: 07/19/2020

CLINICAL DATA: Status post coronary bypass grafting

EXAM:
PORTABLE CHEST 1 VIEW

[chest ap]
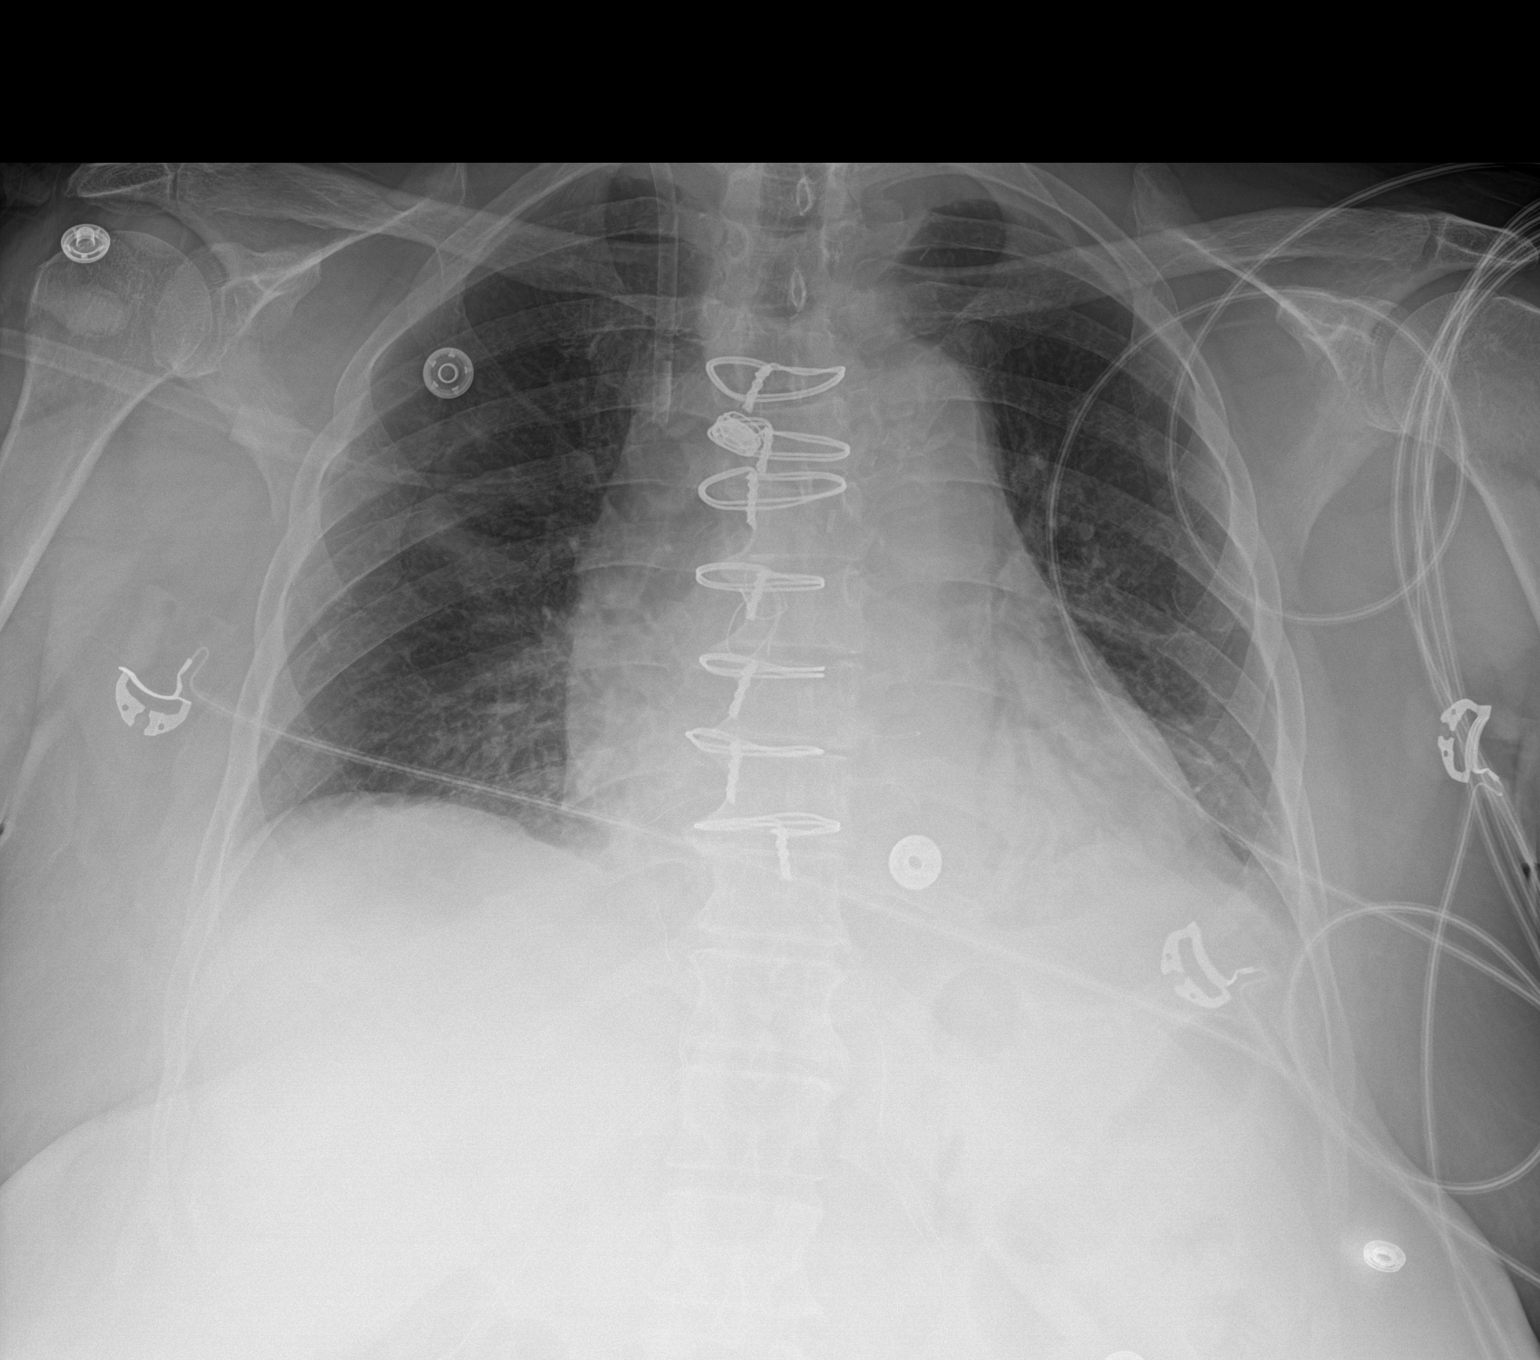

[1 of 1 positions shown; findings below may reference images not displayed]

FINDINGS: Endotracheal tube and gastric catheter have been removed in the
interval. Swan-Ganz catheter has also been removed. Right jugular
sheath remains in place left-sided thoracostomy catheter,
mediastinal drain and pericardial drain have all been removed. No
recurrent pneumothorax is noted. Minimal left basilar atelectasis is
noted similar to that seen on the prior study.
IMPRESSION: Left basilar atelectasis.

Tubes and lines removed in the interval.

## 2021-11-10 ENCOUNTER — Other Ambulatory Visit: Payer: Medicare HMO | Admitting: *Deleted

## 2021-11-10 DIAGNOSIS — I25118 Atherosclerotic heart disease of native coronary artery with other forms of angina pectoris: Secondary | ICD-10-CM | POA: Diagnosis not present

## 2021-11-10 DIAGNOSIS — Z79899 Other long term (current) drug therapy: Secondary | ICD-10-CM

## 2021-11-10 DIAGNOSIS — E785 Hyperlipidemia, unspecified: Secondary | ICD-10-CM | POA: Diagnosis not present

## 2021-11-10 DIAGNOSIS — Z951 Presence of aortocoronary bypass graft: Secondary | ICD-10-CM

## 2021-11-10 LAB — LIPID PANEL
Chol/HDL Ratio: 2.5 ratio (ref 0.0–4.4)
Cholesterol, Total: 96 mg/dL — ABNORMAL LOW (ref 100–199)
HDL: 39 mg/dL — ABNORMAL LOW (ref >39)
LDL Chol Calc (NIH): 37 mg/dL (ref 0–99)
Triglycerides: 108 mg/dL (ref 0–149)
VLDL Cholesterol Cal: 20 mg/dL (ref 5–40)

## 2021-11-13 ENCOUNTER — Other Ambulatory Visit: Payer: Self-pay | Admitting: *Deleted

## 2021-11-13 MED ORDER — TICAGRELOR 90 MG PO TABS: 90.0000 mg | ORAL_TABLET | Freq: Two times a day (BID) | ORAL | 3 refills | Status: DC

## 2021-11-14 ENCOUNTER — Other Ambulatory Visit: Payer: Self-pay | Admitting: *Deleted

## 2021-11-14 MED ORDER — METOPROLOL SUCCINATE ER 25 MG PO TB24: 12.5000 mg | ORAL_TABLET | Freq: Every day | ORAL | 0 refills | Status: DC

## 2021-11-21 IMAGING — DX DG CHEST 2V
2 series · 2 of 2 positions shown · non-contrast
Comparison: July 23, 2020

CLINICAL DATA: Status post coronary artery bypass grafting

EXAM:
CHEST - 2 VIEW

[dg chest 2 view (1 of 2)]
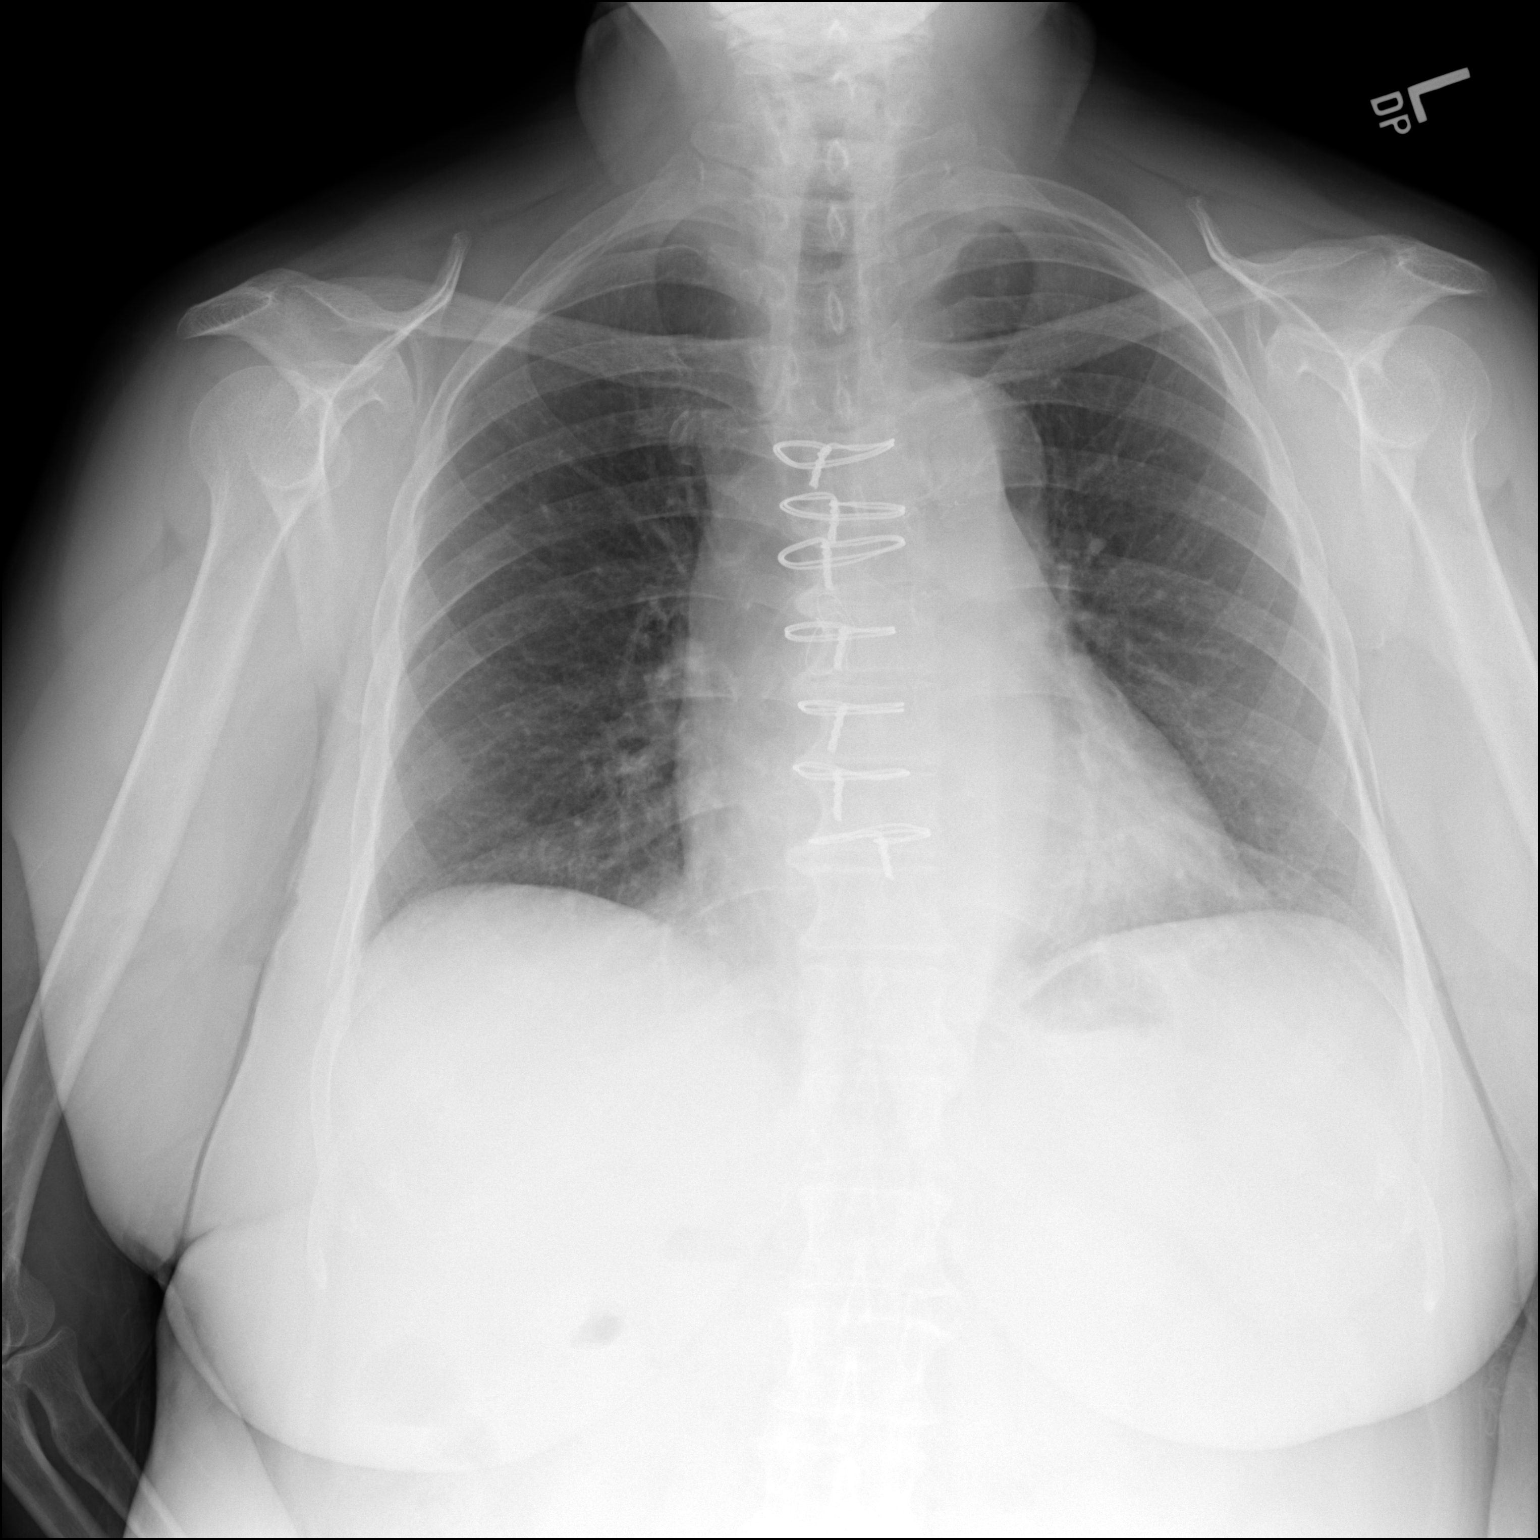

[dg chest 2 view (2 of 2)]
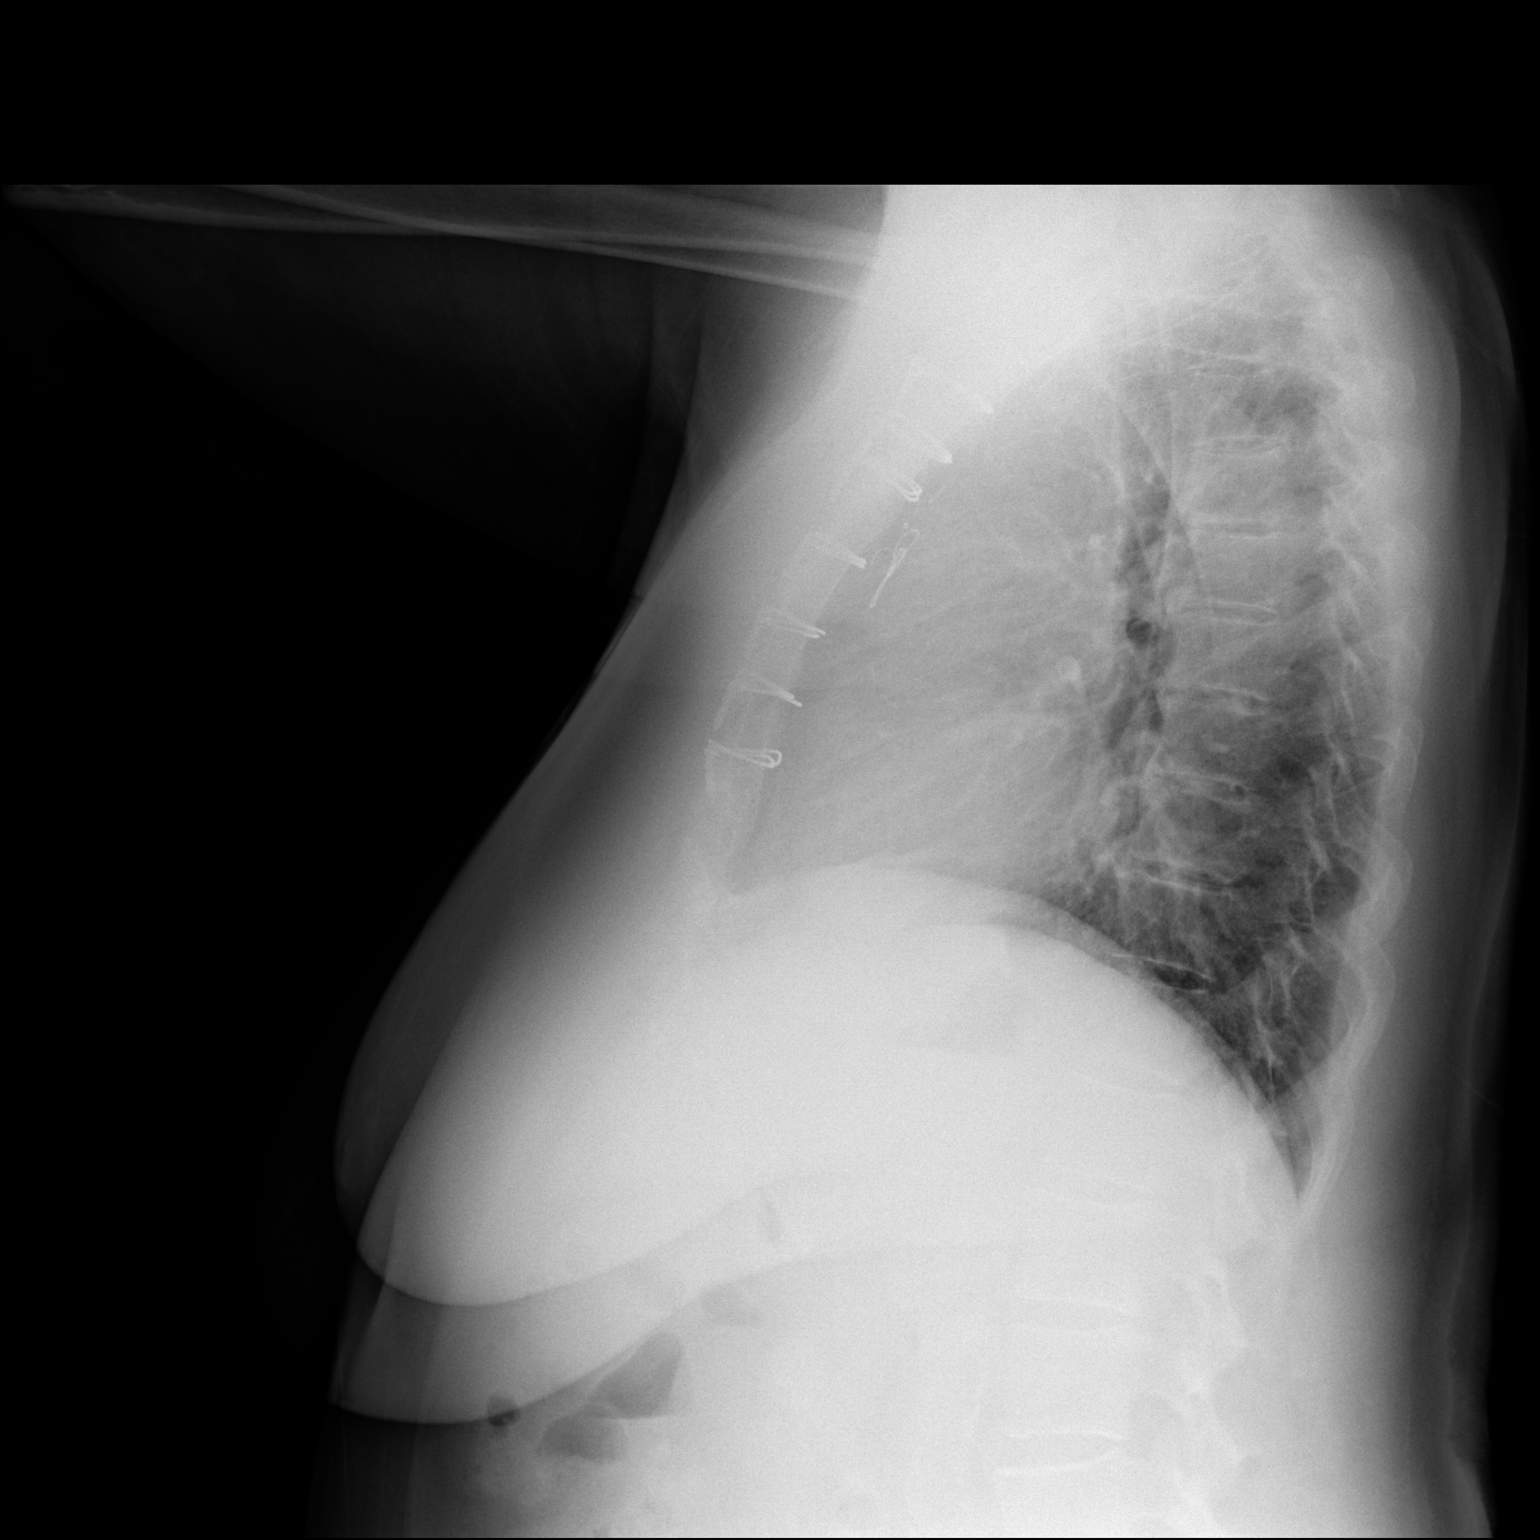

[2 of 2 positions shown; findings below may reference images not displayed]

FINDINGS: Lungs are clear. Heart size and pulmonary vascularity normal.
Patient is status post coronary artery bypass grafting. No
adenopathy. No pneumothorax. No bone lesions.
IMPRESSION: Status post coronary artery bypass grafting. Lungs clear. Cardiac
silhouette within normal limits.

## 2021-11-27 ENCOUNTER — Other Ambulatory Visit: Payer: Self-pay | Admitting: *Deleted

## 2021-11-27 MED ORDER — FEROCON PO CAPS: 1.0000 | ORAL_CAPSULE | Freq: Two times a day (BID) | ORAL | 3 refills | Status: DC

## 2021-11-27 NOTE — Telephone Encounter (Signed)
Call placed to pt, she confirms that she isn't taking Aspirin any longer. ?

## 2021-11-27 NOTE — Addendum Note (Signed)
Addended by: Gaetano Net on: 11/27/2021 09:04 AM ? ? Modules accepted: Orders ? ?

## 2021-11-27 NOTE — Telephone Encounter (Signed)
Received refill request for Aspirin, looks like pt requested it to be moved. ? ?Will close encounter and contact pt at a later time.  ?

## 2021-11-29 ENCOUNTER — Telehealth: Payer: Self-pay

## 2021-11-29 NOTE — Telephone Encounter (Signed)
Yes, pt should still be taking ASA 81 mg po daily. ?Thanks! ?

## 2021-12-04 MED ORDER — ASPIRIN 81 MG PO TBEC: 81.0000 mg | DELAYED_RELEASE_TABLET | Freq: Every day | ORAL | 5 refills | Status: DC

## 2021-12-28 ENCOUNTER — Encounter: Payer: Self-pay | Admitting: Cardiology

## 2022-01-08 IMAGING — CT CT ABD-PEL WO/W CM
2 of 9 series · 13 of 46 positions shown, 15 images · IV contrast (Omni 300)
Comparison: None.

CLINICAL DATA: Acute, nonlocalized abdominal pain, status post
cardiac catheterization, right hip pain and swelling

EXAM:
CT ABDOMEN AND PELVIS WITHOUT AND WITH CONTRAST
TECHNIQUE: Multidetector CT imaging of the abdomen and pelvis was performed
following the standard protocol before and following the bolus
administration of intravenous contrast.
CONTRAST:  100mL OMNIPAQUE IOHEXOL 300 MG/ML  SOLN

[Series 5: a/p w/o cor · coronal · non-contrast · 0.76mm/px · 3 of 151 slices shown]
[im 38/151  soft-tissue]
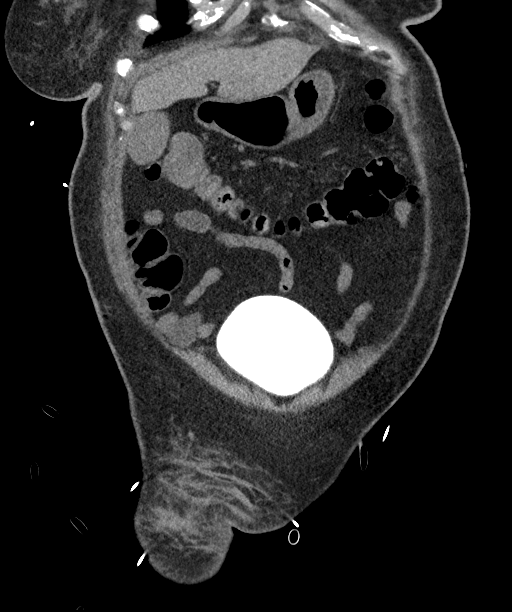
[im 76/151  soft-tissue]
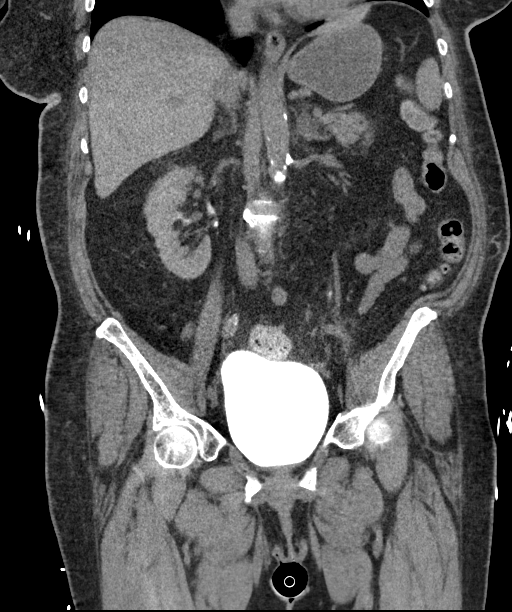
[im 113/151  soft-tissue]
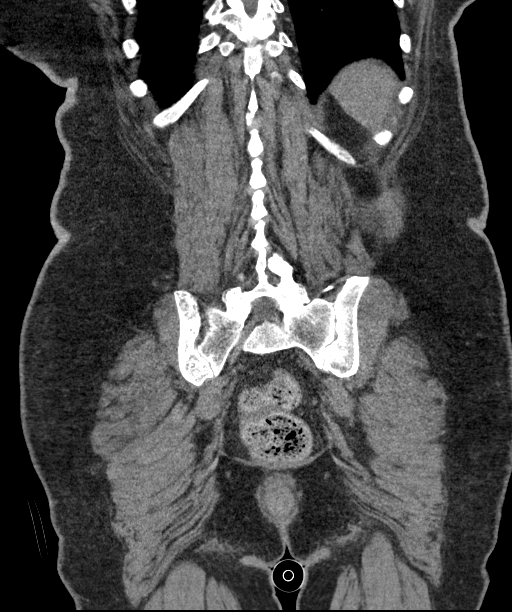

[Series 9: a/p w/ 5mm · axial · 0.80mm/px · z∈[+664,+1084]mm · 10 of 104 slices shown, 12 images]
[im 10/104  soft-tissue]
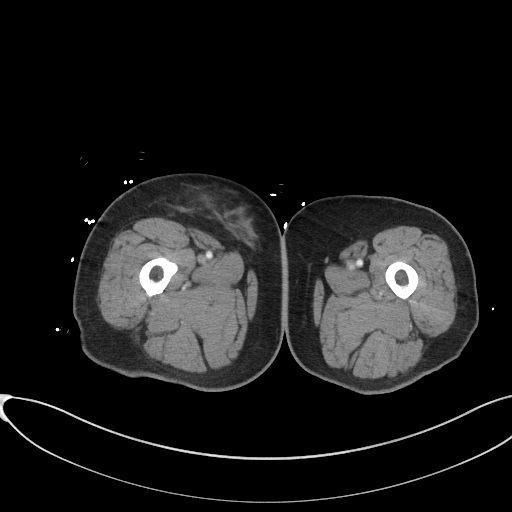
[im 10/104  bone]
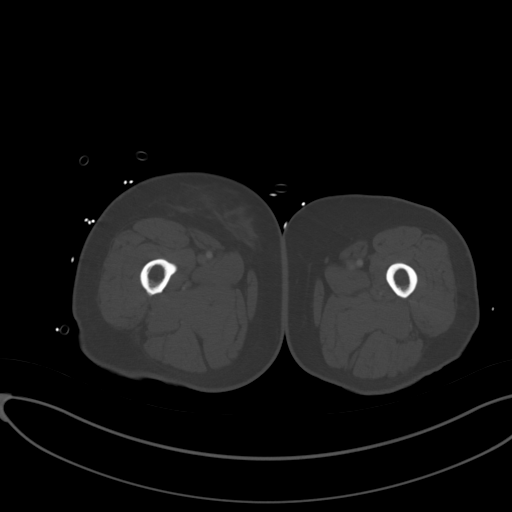
[im 19/104  soft-tissue]
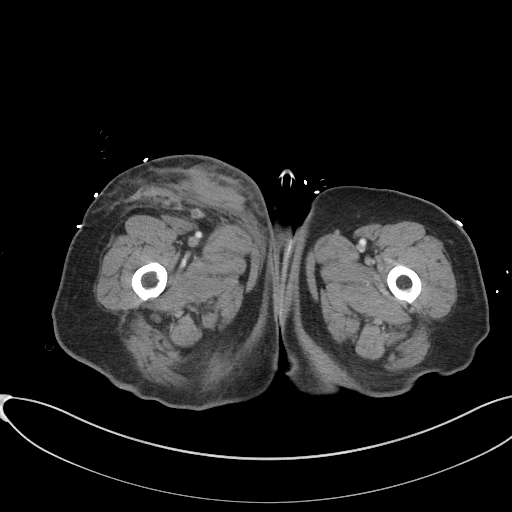
[im 29/104  soft-tissue]
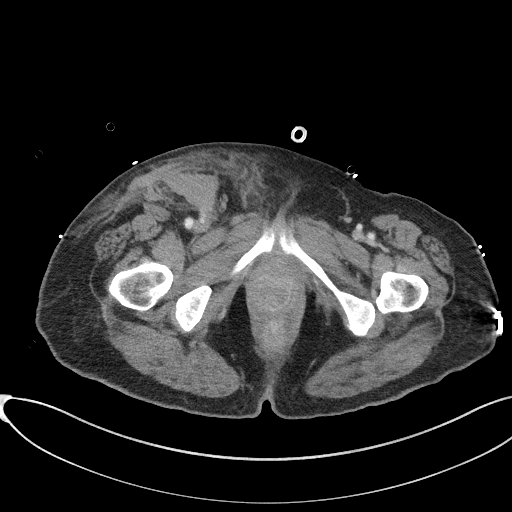
[im 38/104  soft-tissue]
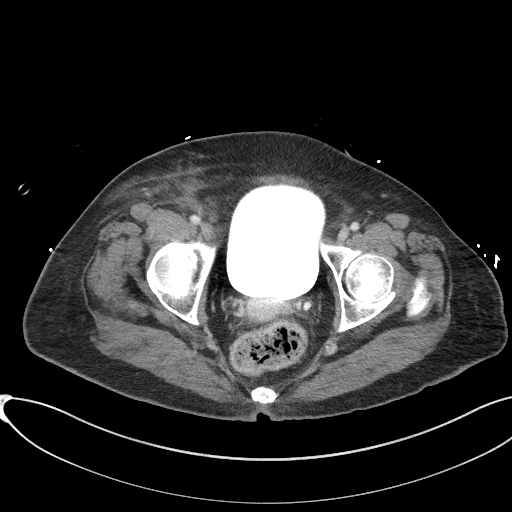
[im 47/104  soft-tissue]
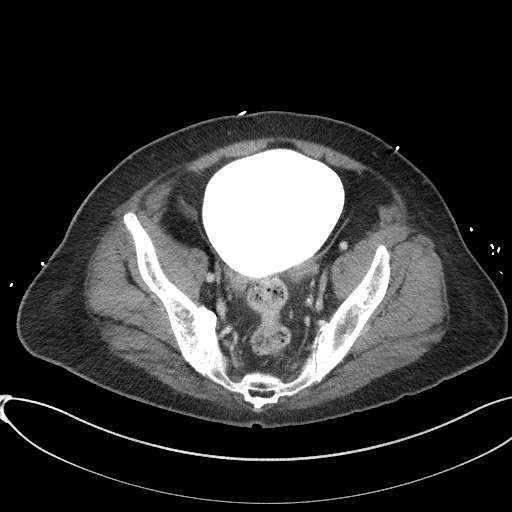
[im 57/104  soft-tissue]
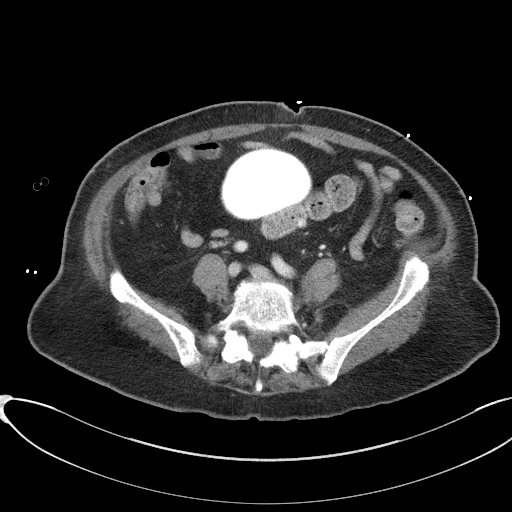
[im 66/104  soft-tissue]
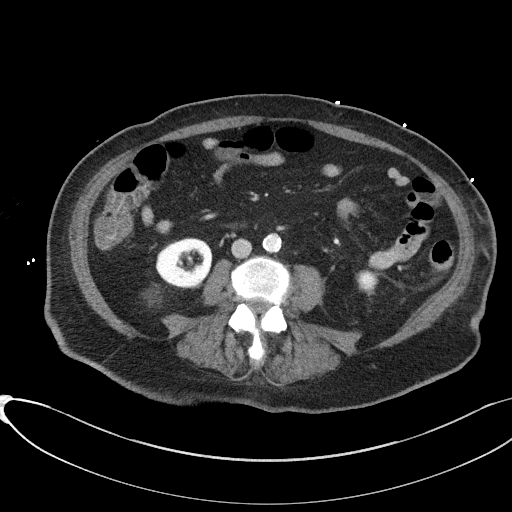
[im 75/104  soft-tissue]
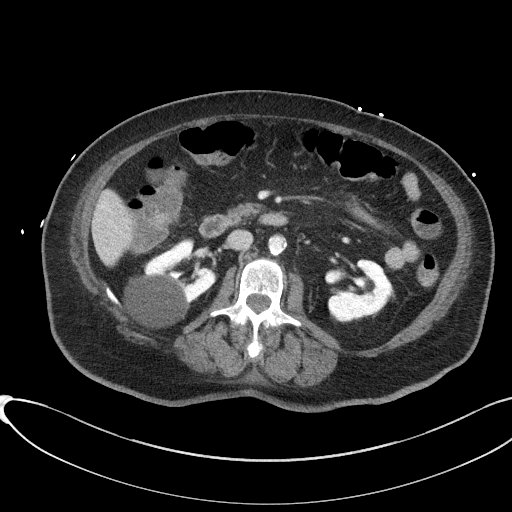
[im 85/104  soft-tissue]
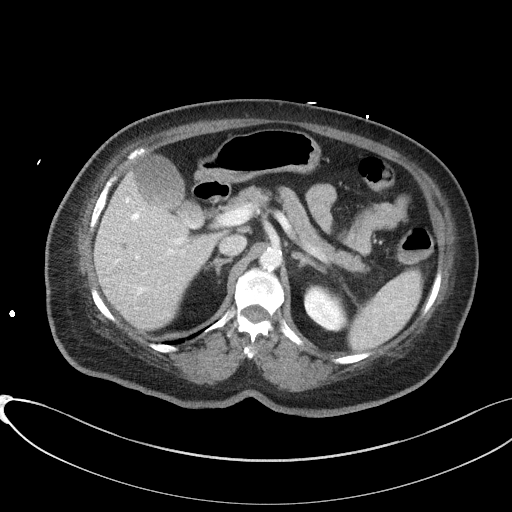
[im 85/104  bone]
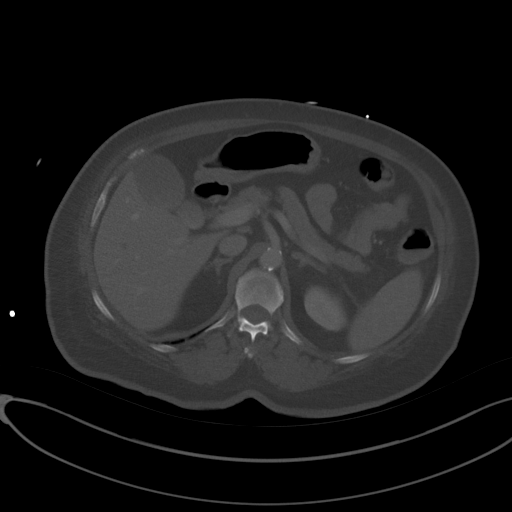
[im 94/104  soft-tissue]
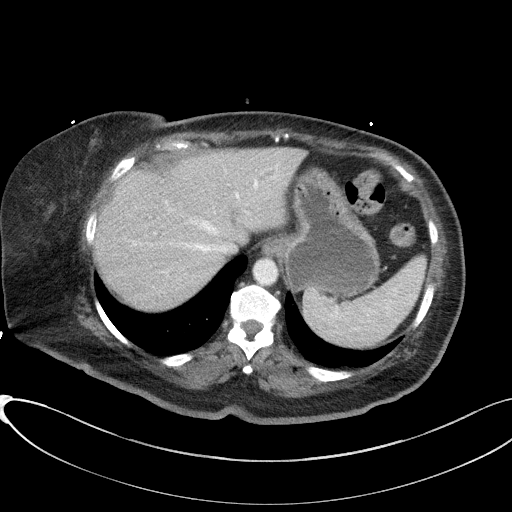

[13 of 46 positions shown; findings below may reference images not displayed]

FINDINGS: Lower chest: The visualized lung bases are clear. The visualized
heart and pericardium are unremarkable.

Hepatobiliary: 2 small subcentimeter cysts are seen within the right
hepatic lobe. The liver is otherwise unremarkable. Probable
vicarious excretion of contrast within the gallbladder. The
gallbladder is otherwise unremarkable. No intra or extrahepatic
biliary ductal dilation.

Pancreas: Unremarkable

Spleen: Unremarkable

Adrenals/Urinary Tract: The adrenal glands are unremarkable. 5.7 cm
simple exophytic cortical cyst arises from the interpolar region of
the right kidney. The kidneys are otherwise unremarkable. The
bladder is distended, but is otherwise unremarkable.

Stomach/Bowel: Mild descending colonic diverticulosis. There is mild
focal inflammatory change surrounding a single diverticulum
involving the mid descending colon, best seen on axial image # 37
and sagittal image # 160 most in keeping with very mild acute or
subacute descending colonic diverticulitis. There is background
moderate descending colonic diverticulosis. The stomach, small
bowel, and large bowel are otherwise unremarkable. No evidence of
obstruction. No free intraperitoneal gas or fluid. Appendix normal.

Vascular/Lymphatic: Moderate aortoiliac atherosclerotic
calcification is present. No aortic aneurysm. Particularly prominent
atherosclerotic calcification is seen at the origin of the left
renal artery and left common iliac artery. There is extensive
infiltrative high attenuation material within the right groin in
anterior right thigh compatible with subcutaneous hemorrhage. There
is, additionally, and enhancing 6 mm nodule within the subcutaneous
tissues anterolateral to the right common femoral artery compatible
with a small pseudoaneurysm. Infiltrative hemorrhage does not extend
into the retroperitoneum or peritoneal cavity.

No pathologic adenopathy within the abdomen and pelvis.

Reproductive: Uterus and bilateral adnexa are unremarkable.

Other: Tiny fat containing umbilical hernia.  Rectum unremarkable.

Musculoskeletal: Degenerative changes are seen within the lumbar
spine. No acute bone abnormality. No lytic or blastic bone lesions
are identified.
IMPRESSION: Extensive interstitial high attenuation material within the anterior
right thigh and right groin compatible with subcutaneous hemorrhage.
Superimposed patent 6 mm pseudoaneurysm anterolateral to the right
common femoral artery. No active extravasation identified.

Very mild acute or subacute uncomplicated descending colonic
diverticulitis.

Peripheral vascular disease with prominent atherosclerotic
calcification at the origin of the left renal and left common iliac
arteries. The degree of stenosis is not well assessed on this non
arteriographic study.

These results will be called to the ordering clinician or
representative by the Radiologist Assistant, and communication
documented in the PACS or [REDACTED].

Aortic Atherosclerosis ([id number]).

## 2022-02-03 ENCOUNTER — Other Ambulatory Visit: Payer: Self-pay | Admitting: Medical

## 2022-02-08 ENCOUNTER — Other Ambulatory Visit: Payer: Self-pay

## 2022-02-08 DIAGNOSIS — I25118 Atherosclerotic heart disease of native coronary artery with other forms of angina pectoris: Secondary | ICD-10-CM

## 2022-02-08 DIAGNOSIS — E785 Hyperlipidemia, unspecified: Secondary | ICD-10-CM

## 2022-02-08 DIAGNOSIS — Z79899 Other long term (current) drug therapy: Secondary | ICD-10-CM

## 2022-02-08 DIAGNOSIS — Z951 Presence of aortocoronary bypass graft: Secondary | ICD-10-CM

## 2022-02-08 MED ORDER — METOPROLOL SUCCINATE ER 25 MG PO TB24: 12.5000 mg | ORAL_TABLET | Freq: Every day | ORAL | 1 refills | Status: DC

## 2022-02-08 MED ORDER — EZETIMIBE 10 MG PO TABS: 10.0000 mg | ORAL_TABLET | Freq: Every day | ORAL | 1 refills | Status: DC

## 2022-02-08 MED ORDER — ATORVASTATIN CALCIUM 80 MG PO TABS: 80.0000 mg | ORAL_TABLET | Freq: Every day | ORAL | 1 refills | Status: DC

## 2022-02-27 NOTE — Progress Notes (Unsigned)
Cardiology Office Note:    Date:  03/01/2022   ID:  [name], DOB 08/06/1946, MRN [id number]  PCP:  Patient, No Pcp Per    [facility]  Cardiologist:  Meriam Sprague, MD  Advanced Practice Provider:  No care team member to display Electrophysiologist:  None   Referring MD: No ref. provider found     History of Present Illness:    Brenda Wilkins is a 76 y.o. female with a hx of with a hx of known CAD s/p CABG 08/05/20 with subsequent NSTEMIs (08/07/20 and 09/29/20) found to have multiple occluded grafts s/p PCI to SVG-Diag as detailed below and HLD who presents to clinic for follow-up.   The patient suffered from an NSTEMI in 07/2021 found to have multivessel CAD on cath 07/17/20 s/p 3V CABG on 07/18/20 (SVG to LAD, sequential SVG to Diag and OM).     Post-operative course was complicated by recurrent chest pain causing her to re-present to the ED on 08/07/20. In the ED, she was found to have elevated troponin consistent with NSTEMI. She underwent repeat cath, which revealed occluded SVG-LAD graft but TIMI 3 flow down LAD from SVG-Diag graft. Case was discussed with CV surgery and given brisk TIMI 3 flow down the LAD from the other bypass graft, the decision was made to manage medically.    She was doing well until 09/29/20 where she developed progressive chest pressure. She presented to [facility] on 10/02/20 and underwent coronary angiography where she had 95% stenosis of the graft before the 1st diag and 100% stenosis of the origin of the graft between the first Diag and OM2. A DES was placed at the origin of the diagonal branch SVG. Her post-cath course was complicated by a right groin hematoma and pseudoaneurysm. She was managed conservatively.   Called the office prior to her visit on 10/17/20 as she noted increased swelling in her groin with a palpable firm mass at prior groin access site. CBC was obtained which showed hemoglobin 12.6 which was improved from 10.3  on discharge. Ultrasound consistent with hematoma but no evidence of pseudoaneurysm. During her visit, she was stable and tolerating her medications. Groin site with sizable firm hematoma which she had been monitoring and was not increasing.   Was last seen in clinic on 08/2021 where she was doing well from a CV standpoint.   Today, the patient overall feels well. No chest pain, SOB, LE edema, orthopnea, PND. Staying active and is able to walk 5x/week without issues. Tolerating medications without issues. Blood pressure is well controlled at home and runs 115-120/60s at home.   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    Past Surgical History:  Procedure Laterality Date   CARDIAC CATHETERIZATION  08/08/2020   CORONARY ARTERY BYPASS GRAFT N/A 07/18/2020   Procedure: CORONARY ARTERY BYPASS GRAFTING (CABG), ON PUMP, TIMES THREE, USING ENDOSCOPICALLY HARVESTED RIGHT GREATER SAPHENOUS VEIN;  Surgeon: Alleen Borne, MD;  Location: [facility] OR;  Service: Open Heart Surgery;  Laterality: N/A;   CORONARY STENT INTERVENTION N/A 10/03/2020   Procedure: CORONARY STENT INTERVENTION;  Surgeon: Runell Gess, MD;  Location: [facility] INVASIVE CV LAB;  Service: Cardiovascular;  Laterality: N/A;   CORONARY/GRAFT ACUTE MI REVASCULARIZATION N/A 07/18/2020   Procedure: Coronary/Graft Acute MI Revascularization;  Surgeon: Swaziland, Peter M,  MD;  Location: [facility] INVASIVE CV LAB;  Service: Cardiovascular;  Laterality: N/A;   INTRAVASCULAR PRESSURE WIRE/FFR STUDY N/A 10/03/2020   Procedure: INTRAVASCULAR PRESSURE WIRE/FFR STUDY;  Surgeon: Runell Gess, MD;  Location: [facility] INVASIVE CV LAB;  Service: Cardiovascular;  Laterality: N/A;   LEFT HEART CATH AND CORONARY ANGIOGRAPHY N/A 07/18/2020   Procedure: LEFT HEART CATH AND CORONARY ANGIOGRAPHY;  Surgeon: Tonny Bollman,  MD;  Location: [facility] INVASIVE CV LAB;  Service: Cardiovascular;  Laterality: N/A;   LEFT HEART CATH AND CORS/GRAFTS ANGIOGRAPHY N/A 08/08/2020   Procedure: LEFT HEART CATH AND CORS/GRAFTS ANGIOGRAPHY;  Surgeon: Lennette Bihari, MD;  Location: [facility] INVASIVE CV LAB;  Service: Cardiovascular;  Laterality: N/A;   LEFT HEART CATH AND CORS/GRAFTS ANGIOGRAPHY N/A 10/03/2020   Procedure: LEFT HEART CATH AND CORS/GRAFTS ANGIOGRAPHY;  Surgeon: Runell Gess, MD;  Location: [facility] INVASIVE CV LAB;  Service: Cardiovascular;  Laterality: N/A;   TEE WITHOUT CARDIOVERSION N/A 07/18/2020   Procedure: TRANSESOPHAGEAL ECHOCARDIOGRAM (TEE);  Surgeon: Alleen Borne, MD;  Location: [facility] OR;  Service: Open Heart Surgery;  Laterality: N/A;    Current Medications: Current Meds  Medication Sig   acetaminophen (TYLENOL) 325 MG tablet Take 325-650 mg by mouth every 6 (six) hours as needed for mild pain (or headaches).   aspirin 81 MG EC tablet Take 1 tablet (81 mg total) by mouth daily. Swallow whole.   atorvastatin (LIPITOR) 80 MG tablet Take 1 tablet (80 mg total) by mouth daily.   Cholecalciferol (VITAMIN D) 50 MCG (2000 UT) CAPS Take 1 capsule by mouth daily.   clopidogrel (PLAVIX) 75 MG tablet Take 1 tablet (75 mg total) by mouth daily.   colchicine 0.6 MG tablet Take 1 tablet (0.6 mg total) by mouth as needed (As needed for gout flare).   ezetimibe (ZETIA) 10 MG tablet Take 1 tablet (10 mg total) by mouth daily.   ferrous fumarate-b12-vitamic C-folic acid (FEROCON) capsule Take 1 capsule by mouth 2 (two) times daily.   metoCLOPramide (REGLAN) 10 MG tablet Take 1 tablet (10 mg total) by mouth every 6 (six) hours as needed for nausea (nausea/headache).   metoprolol succinate (TOPROL-XL) 25 MG 24 hr tablet Take 0.5 tablets (12.5 mg total) by mouth daily.   nitroGLYCERIN (NITROSTAT) 0.4 MG SL tablet PLACE 1 TABLET (0.4 MG TOTAL) UNDER THE TONGUE EVERY 5 (FIVE) MINUTES X 3 DOSES AS NEEDED FOR CHEST PAIN.   oxyCODONE  (ROXICODONE) 5 MG immediate release tablet Take 0.5-1 tablets (2.5-5 mg total) by mouth every 6 (six) hours as needed for severe pain.   TART CHERRY PO Take 1 capsule by mouth daily.   [DISCONTINUED] ticagrelor (BRILINTA) 90 MG TABS tablet Take 1 tablet (90 mg total) by mouth 2 (two) times daily.     Allergies:   Patient has no known allergies.   Social History   Socioeconomic History   Marital status: Widowed    Spouse name: Not on file   Number of children: Not on file   Years of education: 12   Highest education level: Not on file  Occupational History   Not on file  Tobacco Use   Smoking status: Never   Smokeless tobacco: Never  Vaping Use   Vaping Use: Never used  Substance and Sexual Activity   Alcohol use: Never   Drug use: Never   Sexual activity: Not Currently  Other Topics Concern   Not on file  Social History Narrative   Not on file   Social Determinants  of Health   Financial Resource Strain: Not on file  Food Insecurity: Not on file  Transportation Needs: Not on file  Physical Activity: Not on file  Stress: Not on file  Social Connections: Not on file     Family History: The patient's family history is not on file.  ROS:   Please see the history of present illness. All other systems reviewed and are negative. Review of Systems  Constitutional: Negative for unexpected weight change.  HENT: Negative for hearing loss and rhinorrhea.   Eyes: Negative for visual disturbance.  Respiratory: Negative for cough.   Cardiovascular: Negative for chest pain and leg swelling.  Gastrointestinal: Negative for nausea, vomiting, diarrhea and blood in stool.  Genitourinary: Negative for dysuria and frequency.  Musculoskeletal: Negative for myalgias and arthralgias.  Skin: Negative for rash.  Neurological: Negative for headaches.  Hematological: Negative for adenopathy.  Psychiatric/Behavioral: Denies depression or anxiety    EKGs/Labs/Other Studies Reviewed:     The following studies were reviewed today: Ultrasound 10/18/20: Summary:  The avascular inhomogeneous structure noted in the right groin has  characteristics of a hematoma, measuring 6.7 cm in length and 4.4 cm in  width.    US PSA 10/04/20: A mixed echogenic structure measuring approximately 4.1 cm x 1.8 cm is  visualized at the right groin with ultrasound characteristics of a  hematoma.    ____________________   Left heart cath 10/03/20: Origin lesion is 100% stenosed. Ost Cx to Prox Cx lesion is 80% stenosed. Ost LAD to Mid LAD lesion is 100% stenosed. Origin to Prox Graft lesion before 1st Diag is 95% stenosed. Origin to Prox Graft lesion between 1st Diag and 3rd Mrg is 100% stenosed. A drug-eluting stent was successfully placed using a STENT RESOLUTE ONYX 4.5X22. Post intervention, there is a 10% residual stenosis. The left ventricular ejection fraction is 50-55% by visual estimate. The left ventricular systolic function is normal. LV end diastolic pressure is normal.   IMPRESSION: Successful PCI drug-eluting stenting of the origin of the diagonal branch SVG with occlusion of the continuation of the diagonal to the obtuse marginal branch.  The DFR of the proximal native circumflex was 0.99 suggesting this was not physiologically significant.  There was dampening at the origin of the dominant RCA which had been demonstrated in several prior cardiac cath but no obstructive disease was noted.  The sheath was secured in place.  The patient was already on aspirin and ticagrelor.  The sheath will be removed once ACT falls below 170 pressure held.  Patient will be hydrated overnight, discharged home in the morning and follow-up with Dr. Shari ProwsPemberton.` _____________   Echo 08/08/20:  1. Septal hypokinesis consistent with post-operative state. Left  ventricular ejection fraction, by estimation, is 50 to 55%. The left  ventricle has low normal function. The left ventricle demonstrates   regional wall motion abnormalities (see scoring  diagram/findings for description). Left ventricular diastolic parameters  are consistent with Grade I diastolic dysfunction (impaired relaxation).   2. Right ventricular systolic function is normal. The right ventricular  size is normal.   3. The mitral valve is normal in structure. Trivial mitral valve  regurgitation. No evidence of mitral stenosis.   4. The aortic valve is tricuspid. Aortic valve regurgitation is not  visualized. No aortic stenosis is present.   5. The inferior vena cava is normal in size with greater than 50%  respiratory variability, suggesting right atrial pressure of 3 mmHg.    Cath 08/09/19: Prox Cx  lesion is 80% stenosed. Prox LAD to Mid LAD lesion is 100% stenosed. Mid LAD lesion is 80% stenosed. Origin to Prox Graft lesion is 100% stenosed. Ost RCA lesion is 40% stenosed. There is mild to moderate left ventricular systolic dysfunction. LV end diastolic pressure is mildly elevated.   Mild LV dysfunction with mild anterolateral hypocontractility, EF estimate at 40% with LVEDP at 20 mmHg.   Significant native CAD with total occlusion of the LAD immediately proximal to the takeoff of the diagonal vessel.   Normal bifurcating ramus intermediate vessel.   Large left circumflex coronary artery with 80% proximal stenosis and competitive filling of a large OM 3 vessel.   Smooth ostial narrowing of the RCA of approximately 30 to 40%.  However with catheter engagement there was significant catheter dampening which improved following IC nitroglycerin administration.   Occluded SVG which had supplied the LAD.   Patent sequential SVG supplying the diagonal vessel which then fills the LAD with previously noted 80% stenosis on an angle in the LAD immediately after the diagonal takeoff, and sequential graft supplying a large OM 3 vessel of the left circumflex coronary artery.   RECOMMENDATION: The patient's non-ST segment  elevation myocardial infarction most likely is due to acute occlusion of the vein graft which had supplied the LAD.  This graft is 92 weeks old.  I suspect this occlusion may have contributed by the fact that the LAD is supplied by the diagonal vessel which is grafted and there is TIMI-3 flow down the LAD system.  There continues to be a high-grade 80+ percent stenosis on an angle in the LAD arising from the diagonal vessel which 3 weeks ago was not able to be entered with a wire during attempted acute angioplasty.  Since the patient is pain-free and there is a high likelihood of thrombus in the vein graft which has occluded plan initial aggressive medical management.  Will initiate DAPT with aspirin/Brilinta.  Continue beta-blocker therapy, initiate nitrate therapy, and continue aggressive lipid-lowering therapy.  If patient continues to have symptoms, consider addition of ranolazine and possible future intervention to the native LAD after an aggressive medical therapy trial.    Diagnostic Dominance: Right      Intervention     TTE 08/08/20: IMPRESSIONS   1. Septal hypokinesis consistent with post-operative state. Left  ventricular ejection fraction, by estimation, is 50 to 55%. The left  ventricle has low normal function. The left ventricle demonstrates  regional wall motion abnormalities (see scoring  diagram/findings for description). Left ventricular diastolic parameters  are consistent with Grade I diastolic dysfunction (impaired relaxation).   2. Right ventricular systolic function is normal. The right ventricular  size is normal.   3. The mitral valve is normal in structure. Trivial mitral valve  regurgitation. No evidence of mitral stenosis.   4. The aortic valve is tricuspid. Aortic valve regurgitation is not  visualized. No aortic stenosis is present.   5. The inferior vena cava is normal in size with greater than 50%  respiratory variability, suggesting right atrial pressure of 3  mmHg.    Cath 07/18/20:   1st Diag lesion is 90% stenosed. Prox Cx lesion is 75% stenosed. Prox LAD-1 lesion is 100% stenosed. Prox LAD-2 lesion is 95% stenosed with 95% stenosed side branch in 1st Diag. Post intervention, there is a 95% residual stenosis. Post intervention, there is a 95% residual stenosis. Post intervention, the side branch was reduced to 95% residual stenosis.   1. Acute occlusion of  the proximal LAD at site of very complex bifurcation lesion. Successful restoration of antegrade flow with POBA into the first diagonal branch. Unable to cross the lesion in the LAD distal to the diagonal with a wire.    Plan: emergent CABG.    Diagnostic Dominance: Right      Intervention          TTE 07/17/20: IMPRESSIONS   1. Left ventricular ejection fraction, by estimation, is 55 to 60%. The  left ventricle has normal function. The left ventricle has no regional  wall motion abnormalities. There is moderate asymmetric left ventricular  hypertrophy. Left ventricular  diastolic parameters are consistent with Grade I diastolic dysfunction  (impaired relaxation).   2. Right ventricular systolic function is normal. The right ventricular  size is normal.   3. The mitral valve is grossly normal. No evidence of mitral valve  regurgitation.   4. The aortic valve was not well visualized. Aortic valve regurgitation  is not visualized. No aortic stenosis is present.   Comparison(s): No prior Echocardiogram.   Conclusion(s)/Recommendation(s): Normal biventricular function without  evidence of hemodynamically significant valvular heart disease.   FINDINGS   Left Ventricle: Left ventricular ejection fraction, by estimation, is 55  to 60%. The left ventricle has normal function. The left ventricle has no  regional wall motion abnormalities. The left ventricular internal cavity  size was small. There is moderate   asymmetric left ventricular hypertrophy. Left ventricular  diastolic  parameters are consistent with Grade I diastolic dysfunction (impaired  relaxation).   Right Ventricle: The right ventricular size is normal. No increase in  right ventricular wall thickness. Right ventricular systolic function is  normal.   Left Atrium: Left atrial size was normal in size.   Right Atrium: Right atrial size was normal in size.   Pericardium: There is no evidence of pericardial effusion.   Mitral Valve: The mitral valve is grossly normal. No evidence of mitral  valve regurgitation.   Tricuspid Valve: The tricuspid valve is grossly normal. Tricuspid valve  regurgitation is not demonstrated.   Aortic Valve: The aortic valve was not well visualized. Aortic valve  regurgitation is not visualized. No aortic stenosis is present.   Pulmonic Valve: The pulmonic valve was grossly normal. Pulmonic valve  regurgitation is not visualized.   Aorta: The aortic root and ascending aorta are structurally normal, with  no evidence of dilitation.   IAS/Shunts: The atrial septum is grossly normal.    07/18/20: CABG Op note: Procedure:   Emergent Median Sternotomy Extracorporeal circulation 3.   Coronary artery bypass grafting x 3   Saphenous vein graft to the LAD Sequential SVG to diagonal and OM.     4.   Endoscopic vein harvest from the right leg  EKG:  EKG was not ordered today.    Recent Labs: 05/07/2021: BUN 17; Creatinine, Ser 0.93; Hemoglobin 14.7; Platelets 197; Potassium 4.5; Sodium 138 09/08/2021: ALT 31  Recent Lipid Panel    Component Value Date/Time   CHOL 96 (L) 11/10/2021 0825   TRIG 108 11/10/2021 0825   HDL 39 (L) 11/10/2021 0825   CHOLHDL 2.5 11/10/2021 0825   CHOLHDL 6.4 07/17/2020 1015   VLDL 31 07/17/2020 1015   LDLCALC 37 11/10/2021 0825      Physical Exam:    VS:  BP 130/82   Pulse 72   Ht 4' 11.5" (1.511 m)   Wt 145 lb 6.4 oz (66 kg)   SpO2 98%   BMI 28.88 kg/m  Wt Readings from Last 3 Encounters:  03/01/22 145  lb 6.4 oz (66 kg)  08/31/21 143 lb 12.8 oz (65.2 kg)  04/05/21 142 lb (64.4 kg)     GEN: Well nourished, well developed in no acute distress HEENT: Normal NECK: No JVD; No carotid bruits LYMPHATICS: No lymphadenopathy CARDIAC: RRR, no murmurs, rubs, gallops RESPIRATORY:  Clear to auscultation without rales, wheezing or rhonchi  ABDOMEN: Soft, non-tender, non-distended MUSCULOSKELETAL:  No edema; No deformity  SKIN: Warm and dry NEUROLOGIC:  Alert and oriented x 3 PSYCHIATRIC:  Normal affect   ASSESSMENT:    1. Coronary artery disease of native artery of native heart with stable angina pectoris (HCC)   2. S/P CABG x 3   3. Hyperlipidemia LDL goal <70   4. NSTEMI (non-ST elevated myocardial infarction) (HCC)   5. S/P angioplasty with stent      PLAN:    In order of problems listed above:  #Multivessel CAD s/p CABG on 07/18/20 with SVG to LAD, sequential SVG to Diag and OM #History of NSTEMI s/p PCI to SVG-D1 Graft: Patient with initial admission for NSTEMI found to have multivessel CAD on cath on 07/17/20 prompting emergent CABG on 07/18/20 as detailed above. LIMA deemed not a suitable bypass conduit due to small size. TTE on 07/17/20 with preserved LVEF 55-60%, no WMA, normal RV function. Post-operatively, she required inotropic support initially but was weaned off and was able to go home on 07/25/20. She was doing well until 01/02 when she developed acute onset back pain radiating to her chest. She presented to the ED where trop up-trended from 30-->500 and ECG with new TWI in the lateral leads (chronic q waves in V1-V3). Underwent coronary angiography which revealed occluded SVG-LAD graft. Suspect occlusion may have contributed by the fact that the LAD is supplied by the diagonal vessel which is grafted and there is TIMI-3 flow down the LAD system. Given concern for significant thrombus in the SVG-LAD graft and TIMI 3 flow down the LAD from SVG-Diag graft, the decision was made to  pursue medical management. She was doing well until 09/29/20 when she developed progressive angina that required readmission on 10/02/20. Underwent coronary angiography on 10/03/20 where the SVG-D1 graft was 95% stenosed proximally and the jump graft to the OM3 was 100% occluded. She ultimately underwent PCI to the SVG-D1. Now presenting for follow-up. -Change ticagrelor to plavix 75mg daily and ASA 81mg daily -Did not tolerate imdur due to hypotension and no active chest pain; can consider ranexa if angina returns -Continue atorvastatin 80mg daily -Continue metop 25mg XL daily -Continue cardiac rehab   #HLD: -Continue atorvastatin 80mg daily -LDL at goal of 37       Medication Adjustments/Labs and Tests Ordered: Current medicines are reviewed at length with the patient today.  Concerns regarding medicines are outlined above.  No orders of the defined types were placed in this encounter.  Meds ordered this encounter  Medications   clopidogrel (PLAVIX) 75 MG tablet    Sig: Take 1 tablet (75 mg total) by mouth daily.    Dispense:  90 tablet    Refill:  3    Patient Instructions  Medication Instructions:  Your physician has recommended you make the following change in your medication:  1) STOP taking Brilinta 2) START taking Aspirin 81 mg daily  3) START taking Plavix 75 mg daily *If you need a refill on your cardiac medications before your next appointment, please call your pharmacy*  Follow-Up: At   [facility], you and your health needs are our priority.  As part of our continuing mission to provide you with exceptional heart care, we have created designated Provider Care Teams.  These Care Teams include your primary Cardiologist (physician) and Advanced Practice Providers (APPs -  Physician Assistants and Nurse Practitioners) who all work together to provide you with the care you need, when you need it.  Your next appointment:   1 year(s)  The format for your next  appointment:   In Person  Provider:   Gordie Belvin E Tayanna Talford, MD     Important Information About Sugar            Signed, Shashana Fullington E Carneshia Raker, MD  03/01/2022 10:30 AM    [facility] 

## 2022-03-01 ENCOUNTER — Encounter: Payer: Self-pay | Admitting: Cardiology

## 2022-03-01 ENCOUNTER — Ambulatory Visit: Payer: Medicare HMO | Admitting: Cardiology

## 2022-03-01 VITALS — BP 130/82 | HR 72 | Ht 59.5 in | Wt 145.4 lb

## 2022-03-01 DIAGNOSIS — Z95820 Peripheral vascular angioplasty status with implants and grafts: Secondary | ICD-10-CM

## 2022-03-01 DIAGNOSIS — I25118 Atherosclerotic heart disease of native coronary artery with other forms of angina pectoris: Secondary | ICD-10-CM

## 2022-03-01 DIAGNOSIS — Z951 Presence of aortocoronary bypass graft: Secondary | ICD-10-CM

## 2022-03-01 DIAGNOSIS — I214 Non-ST elevation (NSTEMI) myocardial infarction: Secondary | ICD-10-CM

## 2022-03-01 DIAGNOSIS — E785 Hyperlipidemia, unspecified: Secondary | ICD-10-CM | POA: Diagnosis not present

## 2022-03-01 MED ORDER — CLOPIDOGREL BISULFATE 75 MG PO TABS: 75.0000 mg | ORAL_TABLET | Freq: Every day | ORAL | 3 refills | Status: DC

## 2022-03-01 NOTE — Patient Instructions (Signed)
Medication Instructions:  Your physician has recommended you make the following change in your medication:  1) STOP taking Brilinta 2) START taking Aspirin 81 mg daily  3) START taking Plavix 75 mg daily *If you need a refill on your cardiac medications before your next appointment, please call your pharmacy*  Follow-Up: At [facility], you and your health needs are our priority.  As part of our continuing mission to provide you with exceptional heart care, we have created designated Provider Care Teams.  These Care Teams include your primary Cardiologist (physician) and Advanced Practice Providers (APPs -  Physician Assistants and Nurse Practitioners) who all work together to provide you with the care you need, when you need it.  Your next appointment:   1 year(s)  The format for your next appointment:   In Person  Provider:   Meriam Sprague, MD     Important Information About Sugar

## 2022-08-05 ENCOUNTER — Emergency Department (HOSPITAL_COMMUNITY)
Admission: EM | Admit: 2022-08-05 | Discharge: 2022-08-06 | Disposition: A | Discharge status: Home / Self Care | Payer: Medicare HMO | Source: Home / Self Care | Attending: Emergency Medicine | Admitting: Emergency Medicine

## 2022-08-05 ENCOUNTER — Emergency Department (HOSPITAL_COMMUNITY)
Admission: EM | Admit: 2022-08-05 | Discharge: 2022-08-05 | Disposition: A | Discharge status: Home / Self Care | Payer: Medicare HMO | Attending: Emergency Medicine | Admitting: Emergency Medicine

## 2022-08-05 ENCOUNTER — Encounter (HOSPITAL_COMMUNITY): Payer: Self-pay | Admitting: *Deleted

## 2022-08-05 ENCOUNTER — Other Ambulatory Visit: Payer: Self-pay

## 2022-08-05 ENCOUNTER — Emergency Department (HOSPITAL_COMMUNITY): Payer: Medicare HMO

## 2022-08-05 DIAGNOSIS — I700 Atherosclerosis of aorta: Secondary | ICD-10-CM | POA: Diagnosis not present

## 2022-08-05 DIAGNOSIS — R0789 Other chest pain: Secondary | ICD-10-CM

## 2022-08-05 DIAGNOSIS — R112 Nausea with vomiting, unspecified: Secondary | ICD-10-CM | POA: Diagnosis not present

## 2022-08-05 DIAGNOSIS — R1013 Epigastric pain: Secondary | ICD-10-CM

## 2022-08-05 DIAGNOSIS — Z951 Presence of aortocoronary bypass graft: Secondary | ICD-10-CM | POA: Insufficient documentation

## 2022-08-05 DIAGNOSIS — Z7982 Long term (current) use of aspirin: Secondary | ICD-10-CM | POA: Insufficient documentation

## 2022-08-05 DIAGNOSIS — I2510 Atherosclerotic heart disease of native coronary artery without angina pectoris: Secondary | ICD-10-CM | POA: Insufficient documentation

## 2022-08-05 DIAGNOSIS — K449 Diaphragmatic hernia without obstruction or gangrene: Secondary | ICD-10-CM | POA: Insufficient documentation

## 2022-08-05 DIAGNOSIS — R109 Unspecified abdominal pain: Secondary | ICD-10-CM | POA: Diagnosis not present

## 2022-08-05 DIAGNOSIS — R079 Chest pain, unspecified: Secondary | ICD-10-CM | POA: Diagnosis not present

## 2022-08-05 DIAGNOSIS — R1111 Vomiting without nausea: Secondary | ICD-10-CM

## 2022-08-05 LAB — COMPREHENSIVE METABOLIC PANEL
ALT: 26 U/L (ref 0–44)
ALT: 27 U/L (ref 0–44)
AST: 27 U/L (ref 15–41)
AST: 28 U/L (ref 15–41)
Albumin: 3.9 g/dL (ref 3.5–5.0)
Albumin: 4 g/dL (ref 3.5–5.0)
Alkaline Phosphatase: 93 U/L (ref 38–126)
Alkaline Phosphatase: 91 U/L (ref 38–126)
Anion gap: 11 (ref 5–15)
Anion gap: 8 (ref 5–15)
BUN: 20 mg/dL (ref 8–23)
BUN: 20 mg/dL (ref 8–23)
CO2: 19 mmol/L — ABNORMAL LOW (ref 22–32)
CO2: 26 mmol/L (ref 22–32)
Calcium: 8.8 mg/dL — ABNORMAL LOW (ref 8.9–10.3)
Calcium: 8.9 mg/dL (ref 8.9–10.3)
Chloride: 110 mmol/L (ref 98–111)
Chloride: 107 mmol/L (ref 98–111)
Creatinine, Ser: 0.86 mg/dL (ref 0.44–1.00)
Creatinine, Ser: 0.95 mg/dL (ref 0.44–1.00)
GFR, Estimated: >60 mL/min (ref >60)
GFR, Estimated: >60 mL/min (ref >60)
Glucose, Bld: 98 mg/dL (ref 70–99)
Glucose, Bld: 157 mg/dL — ABNORMAL HIGH (ref 70–99)
Potassium: 4.2 mmol/L (ref 3.5–5.1)
Potassium: 4.1 mmol/L (ref 3.5–5.1)
Sodium: 140 mmol/L (ref 135–145)
Sodium: 141 mmol/L (ref 135–145)
Total Bilirubin: 0.6 mg/dL (ref 0.3–1.2)
Total Bilirubin: 0.4 mg/dL (ref 0.3–1.2)
Total Protein: 6.9 g/dL (ref 6.5–8.1)
Total Protein: 7.1 g/dL (ref 6.5–8.1)

## 2022-08-05 LAB — CBC WITH DIFFERENTIAL/PLATELET
Abs Immature Granulocytes: 0.03 10*3/uL (ref 0.00–0.07)
Basophils Absolute: 0 10*3/uL (ref 0.0–0.1)
Basophils Relative: 0 %
Eosinophils Absolute: 0.2 10*3/uL (ref 0.0–0.5)
Eosinophils Relative: 2 %
HCT: 45.1 % (ref 36.0–46.0)
Hemoglobin: 14.9 g/dL (ref 12.0–15.0)
Immature Granulocytes: 0 %
Lymphocytes Relative: 29 %
Lymphs Abs: 2.5 10*3/uL (ref 0.7–4.0)
MCH: 31.9 pg (ref 26.0–34.0)
MCHC: 33 g/dL (ref 30.0–36.0)
MCV: 96.6 fL (ref 80.0–100.0)
Monocytes Absolute: 0.6 10*3/uL (ref 0.1–1.0)
Monocytes Relative: 7 %
Neutro Abs: 5.1 10*3/uL (ref 1.7–7.7)
Neutrophils Relative %: 62 %
Platelets: 227 10*3/uL (ref 150–400)
RBC: 4.67 MIL/uL (ref 3.87–5.11)
RDW: 12.2 % (ref 11.5–15.5)
WBC: 8.4 10*3/uL (ref 4.0–10.5)
nRBC: 0 % (ref 0.0–0.2)

## 2022-08-05 LAB — URINALYSIS, ROUTINE W REFLEX MICROSCOPIC
Bacteria, UA: NONE SEEN
Bilirubin Urine: NEGATIVE
Glucose, UA: NEGATIVE mg/dL
Hgb urine dipstick: NEGATIVE
Ketones, ur: NEGATIVE mg/dL
Nitrite: NEGATIVE
Protein, ur: NEGATIVE mg/dL
Specific Gravity, Urine: 1.014 (ref 1.005–1.030)
pH: 5 (ref 5.0–8.0)

## 2022-08-05 LAB — CBC
HCT: 47.4 % — ABNORMAL HIGH (ref 36.0–46.0)
Hemoglobin: 15 g/dL (ref 12.0–15.0)
MCH: 31.8 pg (ref 26.0–34.0)
MCHC: 31.6 g/dL (ref 30.0–36.0)
MCV: 100.6 fL — ABNORMAL HIGH (ref 80.0–100.0)
Platelets: 217 10*3/uL (ref 150–400)
RBC: 4.71 MIL/uL (ref 3.87–5.11)
RDW: 12 % (ref 11.5–15.5)
WBC: 8.8 10*3/uL (ref 4.0–10.5)
nRBC: 0 % (ref 0.0–0.2)

## 2022-08-05 LAB — LIPASE, BLOOD
Lipase: 79 U/L — ABNORMAL HIGH (ref 11–51)
Lipase: 73 U/L — ABNORMAL HIGH (ref 11–51)

## 2022-08-05 LAB — TROPONIN I (HIGH SENSITIVITY)
Troponin I (High Sensitivity): 5 ng/L (ref <18)
Troponin I (High Sensitivity): 6 ng/L (ref <18)
Troponin I (High Sensitivity): 6 ng/L (ref <18)

## 2022-08-05 MED ORDER — ONDANSETRON 4 MG PO TBDP
4.0000 mg | ORAL_TABLET | Freq: Once | ORAL | Status: AC
  Administered 2022-08-05: 4 mg via ORAL
  Filled 2022-08-05: qty 1

## 2022-08-05 MED ORDER — OXYCODONE-ACETAMINOPHEN 5-325 MG PO TABS: 1.0000 | ORAL_TABLET | Freq: Once | ORAL | Status: DC

## 2022-08-05 NOTE — ED Triage Notes (Signed)
Pt c/o acute onset epigastric pain at 1230.  Pain was so severe pt was dry heaving.  Pt took nitro and pain was relieved.

## 2022-08-05 NOTE — Discharge Instructions (Signed)
Follow-up with your cardiologist.  Return if symptoms worsen.

## 2022-08-05 NOTE — ED Triage Notes (Signed)
Patient reports pain across her abdomen this morning with emesis this evening . No diarrhea or fever .

## 2022-08-05 NOTE — ED Provider Triage Note (Signed)
  Emergency Medicine Provider Triage Evaluation Note  MRN:  [id number]  Arrival date & time: 08/05/22    Medically screening exam initiated at 10:13 PM.   CC:   Abdominal Pain   HPI:  Brenda Wilkins is a 76 y.o. year-old female presents to the ED with chief complaint of persistent epigastric abdominal pain and vomiting.  States that it radiates into her back.  She denies fever.  Denies pain or numbness radiating into her legs.  History provided by patient. ROS:  -As included in HPI PE:  There were no vitals filed for this visit.  Non-toxic appearing No respiratory distress Retching during exam Mild epigastric tenderness MDM:  Abdominal pain. I've ordered labs and imaging in triage to expedite lab/diagnostic workup.  Patient was informed that the remainder of the evaluation will be completed by another provider, this initial triage assessment does not replace that evaluation, and the importance of remaining in the ED until their evaluation is complete.    Roxy Horseman, PA-C 08/05/22 2215

## 2022-08-05 NOTE — ED Provider Triage Note (Signed)
Emergency Medicine Provider Triage Evaluation Note  Qiara Minetti , a 76 y.o. female  was evaluated in triage.  Pt complains of epigastric pain. Epigastric pain earlier today and dryheaving with nausea.  Pain resolved with SL nitro.  No fever, cough, arm pain, sob, dizzy.  No active pain  Review of Systems  Positive: As above Negative: As above  Physical Exam  BP 137/67 (BP Location: Left Arm)   Pulse 69   Temp 97.6 F (36.4 C) (Oral)   Resp 16   Ht 4' 11.5" (1.511 m)   Wt 67.1 kg   SpO2 98%   BMI 29.39 kg/m  Gen:   Awake, no distress   Resp:  Normal effort  MSK:   Moves extremities without difficulty  Other:    Medical Decision Making  Medically screening exam initiated at 1:42 PM.  Appropriate orders placed.  Raiana Pharris was informed that the remainder of the evaluation will be completed by another provider, this initial triage assessment does not replace that evaluation, and the importance of remaining in the ED until their evaluation is complete.     Fayrene Helper, PA-C 08/05/22 1346

## 2022-08-05 NOTE — ED Notes (Signed)
This RN reviewed discharge instructions with patient and daughter. Both verbalized understanding and denied any further questions. PT well appearing upon discharge and reports tolerable pain. Pt ambulated with stable gait to exit. PIV removed.

## 2022-08-05 NOTE — ED Notes (Signed)
Pt ambulated to bathroom with stable gait.

## 2022-08-05 NOTE — ED Notes (Signed)
NAD noted, respirations are equal bilaterally and unlabored at this time. Pt resting in gurney and denies any unmet needs. Pt connected to CCM, pulseox & BP. Call light within reach. Support person at bedside.

## 2022-08-05 NOTE — ED Notes (Signed)
NAD noted, respirations are equal bilaterally and unlabored at this time. Pt denies SOB & CP. Pt resting in gurney and denies any unmet needs. Pt connected to CCM, pulseox & BP. Call light within reach.

## 2022-08-05 NOTE — ED Provider Notes (Signed)
[facility] EMERGENCY DEPARTMENT Provider Note   CSN: [id number] Arrival date & time: 08/05/22  1315     History  Chief Complaint  Patient presents with   Abdominal Pain   Chest Pain    Brenda Wilkins is a 76 y.o. female.  Patient is here with upper abdominal pain radiated into her chest, between her shoulder blades.  Last for an hour and got completely better with nitroglycerin after 1 dose.  She is chest pain and abdominal pain-free now.  She has not had a lot of chest pain or abdominal pain or events like this here in the last few weeks.  Feels similar to when she has had cardiac events in the past.  She has a history of CAD status post CABG and stent.  She has been compliant with her medications.  She denies any nausea, vomiting, chest pain, abdominal pain currently.  No weakness or numbness.  No recent surgery or travel.  No history of blood clots.  The history is provided by the patient.       Home Medications Prior to Admission medications   Medication Sig Start Date End Date Taking? Authorizing Provider  acetaminophen (TYLENOL) 325 MG tablet Take 325-650 mg by mouth every 6 (six) hours as needed for mild pain (or headaches).    [provider]  aspirin 81 MG EC tablet Take 1 tablet (81 mg total) by mouth daily. Swallow whole. 12/04/21   Meriam Sprague, MD  atorvastatin (LIPITOR) 80 MG tablet Take 1 tablet (80 mg total) by mouth daily. 02/08/22   Meriam Sprague, MD  Cholecalciferol (VITAMIN D) 50 MCG (2000 UT) CAPS Take 1 capsule by mouth daily.    [provider]  clopidogrel (PLAVIX) 75 MG tablet Take 1 tablet (75 mg total) by mouth daily. 03/01/22   Meriam Sprague, MD  colchicine 0.6 MG tablet Take 1 tablet (0.6 mg total) by mouth as needed (As needed for gout flare). 04/05/21   Alver Sorrow, NP  ezetimibe (ZETIA) 10 MG tablet Take 1 tablet (10 mg total) by mouth daily. 02/08/22   Meriam Sprague, MD  ferrous  fumarate-b12-vitamic C-folic acid (FEROCON) capsule Take 1 capsule by mouth 2 (two) times daily. 11/27/21   Meriam Sprague, MD  metoCLOPramide (REGLAN) 10 MG tablet Take 1 tablet (10 mg total) by mouth every 6 (six) hours as needed for nausea (nausea/headache). 05/07/21   Arthor Captain, PA-C  metoprolol succinate (TOPROL-XL) 25 MG 24 hr tablet Take 0.5 tablets (12.5 mg total) by mouth daily. 02/08/22   Meriam Sprague, MD  nitroGLYCERIN (NITROSTAT) 0.4 MG SL tablet PLACE 1 TABLET (0.4 MG TOTAL) UNDER THE TONGUE EVERY 5 (FIVE) MINUTES X 3 DOSES AS NEEDED FOR CHEST PAIN. 02/05/22   Meriam Sprague, MD  oxyCODONE (ROXICODONE) 5 MG immediate release tablet Take 0.5-1 tablets (2.5-5 mg total) by mouth every 6 (six) hours as needed for severe pain. 05/07/21   Harris, Abigail, PA-C  TART CHERRY PO Take 1 capsule by mouth daily.    [provider]      Allergies    Patient has no known allergies.    Review of Systems   Review of Systems  Physical Exam Updated Vital Signs BP (!) 125/46   Pulse (!) 57   Temp 98.2 F (36.8 C) (Oral)   Resp 12   Ht 4' 11.5" (1.511 m)   Wt 67.1 kg   SpO2 98%   BMI 29.39  kg/m  Physical Exam Vitals and nursing note reviewed.  Constitutional:      General: She is not in acute distress.    Appearance: She is well-developed. She is not ill-appearing.  HENT:     Head: Normocephalic and atraumatic.  Eyes:     Extraocular Movements: Extraocular movements intact.     Conjunctiva/sclera: Conjunctivae normal.  Cardiovascular:     Rate and Rhythm: Normal rate and regular rhythm.     Heart sounds: Normal heart sounds. No murmur heard. Pulmonary:     Effort: Pulmonary effort is normal. No respiratory distress.     Breath sounds: Normal breath sounds.  Abdominal:     Palpations: Abdomen is soft.     Tenderness: There is no abdominal tenderness. There is no guarding or rebound.  Musculoskeletal:        General: No swelling.     Cervical back:  Neck supple.  Skin:    General: Skin is warm and dry.     Capillary Refill: Capillary refill takes less than 2 seconds.  Neurological:     Mental Status: She is alert.  Psychiatric:        Mood and Affect: Mood normal.     ED Results / Procedures / Treatments   Labs (all labs ordered are listed, but only abnormal results are displayed) Labs Reviewed  CBC - Abnormal; Notable for the following components:      Result Value   HCT 47.4 (*)    MCV 100.6 (*)    All other components within normal limits  COMPREHENSIVE METABOLIC PANEL - Abnormal; Notable for the following components:   CO2 19 (*)    Calcium 8.8 (*)    All other components within normal limits  LIPASE, BLOOD - Abnormal; Notable for the following components:   Lipase 79 (*)    All other components within normal limits  TROPONIN I (HIGH SENSITIVITY)  TROPONIN I (HIGH SENSITIVITY)    EKG EKG Interpretation  Date/Time:  Sunday August 05 2022 13:26:47 EST Ventricular Rate:  72 PR Interval:  146 QRS Duration: 92 QT Interval:  410 QTC Calculation: 448 R Axis:   -14 Text Interpretation: Normal sinus rhythm Low voltage QRS Incomplete right bundle branch block Nonspecific T wave abnormality Confirmed by Cathren Laine ([id number]) on 08/05/2022 2:35:42 PM  Radiology DG Chest 1 View  Result Date: 08/05/2022 CLINICAL DATA:  Chest pain just below the xiphoid process. EXAM: CHEST  1 VIEW COMPARISON:  Two-view chest x-ray 05/07/2021 FINDINGS: The heart size and mediastinal contours are within normal limits. Both lungs are clear. The visualized skeletal structures are unremarkable. IMPRESSION: Negative one view chest x-ray Electronically Signed   By: Marin Roberts M.D.   On: 08/05/2022 14:20    Procedures Procedures    Medications Ordered in ED Medications - No data to display  ED Course/ Medical Decision Making/ A&P                           Medical Decision Making  Brenda Wilkins is here after episode of chest  pain and abdominal pain.  Normal vitals.  No fever.  She is pain-free now.  She has history of CAD status post stent.  No other major medical problems.  She had about 45 minutes to an hour of some discomfort in her upper abdomen that radiated up to her chest.  Got better with nitro and rest.  She has not been having any chest  pain or episodes like this here recently.  Recent cardiac process over a year ago.  He had a stent placed and a heart cath.  Overall she well-appearing.  She has no symptoms now.  Differential diagnosis includes may be GI related issue, possibly pancreatitis or gallbladder etiology pain versus ACS.  Have no concern for PE or dissection.  Per my review interpretation of labs no significant anemia, electrolyte abnormality or kidney injury.  Gallbladder and liver enzymes within normal limits.  Lipase was unremarkable.  Troponin is 5.  EKG per my review interpretation shows sinus rhythm.  No obvious ischemic changes.  Unchanged from prior EKGs.  Chest x-ray showed no evidence of pneumonia or pneumothorax.  Overall patient appears very well.  Talked with Dr. Wyline Mood of cardiology and will trend second troponin.  If unremarkable will have her follow-up outpatient cardiology.  She has not been having any symptoms of chest pain or anginal pain here in the last few weeks or months.  Overall this is 1 episode she has had somewhat of an atypical story.  Troponin within normal limits and stable.  Patient will follow-up outpatient with cardiology.  Discharged in good condition.  This chart was dictated using voice recognition software.  Despite best efforts to proofread,  errors can occur which can change the documentation meaning.           Final Clinical Impression(s) / ED Diagnoses Final diagnoses:  Atypical chest pain  Epigastric pain    Rx / DC Orders ED Discharge Orders     None         Virgina Norfolk, DO 08/05/22 1742

## 2022-08-06 ENCOUNTER — Emergency Department (HOSPITAL_COMMUNITY): Payer: Medicare HMO

## 2022-08-06 DIAGNOSIS — R109 Unspecified abdominal pain: Secondary | ICD-10-CM | POA: Diagnosis not present

## 2022-08-06 DIAGNOSIS — I700 Atherosclerosis of aorta: Secondary | ICD-10-CM | POA: Diagnosis not present

## 2022-08-06 MED ORDER — ONDANSETRON HCL 4 MG PO TABS: 4.0000 mg | ORAL_TABLET | Freq: Four times a day (QID) | ORAL | 0 refills | Status: DC

## 2022-08-06 MED ORDER — FAMOTIDINE 20 MG PO TABS: 20.0000 mg | ORAL_TABLET | Freq: Every day | ORAL | 0 refills | Status: DC

## 2022-08-06 MED ORDER — IOHEXOL 350 MG/ML SOLN
75.0000 mL | Freq: Once | INTRAVENOUS | Status: AC | PRN
  Administered 2022-08-06: 75 mL via INTRAVENOUS

## 2022-08-06 MED ORDER — MAALOX MAX 400-400-40 MG/5ML PO SUSP: 15.0000 mL | Freq: Four times a day (QID) | ORAL | 0 refills | Status: DC | PRN

## 2022-08-06 MED ORDER — ALUM & MAG HYDROXIDE-SIMETH 200-200-20 MG/5ML PO SUSP
30.0000 mL | Freq: Once | ORAL | Status: AC
  Administered 2022-08-06: 30 mL via ORAL
  Filled 2022-08-06: qty 30

## 2022-08-06 MED ORDER — FAMOTIDINE 20 MG PO TABS
20.0000 mg | ORAL_TABLET | Freq: Once | ORAL | Status: AC
  Administered 2022-08-06: 20 mg via ORAL
  Filled 2022-08-06: qty 1

## 2022-08-06 NOTE — ED Provider Notes (Signed)
[facility] EMERGENCY DEPARTMENT Provider Note   CSN: [id number] Arrival date & time: 08/05/22  2151     History  Chief Complaint  Patient presents with   Abdominal Pain    Brenda Wilkins is a 77 y.o. female.  Is a 77 year old female with a past medical history of CAD status post CABG presenting to the emergency department with abdominal pain.  The patient states that she initially woke up yesterday morning with epigastric abdominal pain radiating into her chest with nausea and vomiting.  She was seen in the emergency department and underwent cardiac evaluation which was normal and was discharged home.  She states that in the evening she ate a peanut butter and banana sandwich and about 2 hours later her abdominal pain with nausea and vomiting returned.  She states that she did not have any chest pain this time.  She denies any recent fevers, diarrhea or constipation, dysuria or hematuria.  She states that since being in the waiting room her pain and nausea have completely resolved.  The history is provided by the patient.  Abdominal Pain      Home Medications Prior to Admission medications   Medication Sig Start Date End Date Taking? Authorizing Provider  alum & mag hydroxide-simeth (MAALOX MAX) 400-400-40 MG/5ML suspension Take 15 mLs by mouth every 6 (six) hours as needed for indigestion. 08/06/22  Yes Maylon Peppers, Jordan Hawks K, DO  famotidine (PEPCID) 20 MG tablet Take 1 tablet (20 mg total) by mouth daily. 08/06/22  Yes Maylon Peppers, [place] K, DO  ondansetron (ZOFRAN) 4 MG tablet Take 1 tablet (4 mg total) by mouth every 6 (six) hours. 08/06/22  Yes Leanord Asal K, DO  acetaminophen (TYLENOL) 325 MG tablet Take 325-650 mg by mouth every 6 (six) hours as needed for mild pain (or headaches).    [provider]  aspirin 81 MG EC tablet Take 1 tablet (81 mg total) by mouth daily. Swallow whole. 12/04/21   Freada Bergeron, MD  atorvastatin (LIPITOR) 80 MG  tablet Take 1 tablet (80 mg total) by mouth daily. 02/08/22   Freada Bergeron, MD  Cholecalciferol (VITAMIN D) 50 MCG (2000 UT) CAPS Take 1 capsule by mouth daily.    [provider]  clopidogrel (PLAVIX) 75 MG tablet Take 1 tablet (75 mg total) by mouth daily. 03/01/22   Freada Bergeron, MD  colchicine 0.6 MG tablet Take 1 tablet (0.6 mg total) by mouth as needed (As needed for gout flare). 04/05/21   Loel Dubonnet, NP  ezetimibe (ZETIA) 10 MG tablet Take 1 tablet (10 mg total) by mouth daily. 02/08/22   Freada Bergeron, MD  ferrous [id number] C-folic acid (FEROCON) capsule Take 1 capsule by mouth 2 (two) times daily. 11/27/21   Freada Bergeron, MD  metoCLOPramide (REGLAN) 10 MG tablet Take 1 tablet (10 mg total) by mouth every 6 (six) hours as needed for nausea (nausea/headache). 05/07/21   Margarita Mail, PA-C  metoprolol succinate (TOPROL-XL) 25 MG 24 hr tablet Take 0.5 tablets (12.5 mg total) by mouth daily. 02/08/22   Freada Bergeron, MD  nitroGLYCERIN (NITROSTAT) 0.4 MG SL tablet PLACE 1 TABLET (0.4 MG TOTAL) UNDER THE TONGUE EVERY 5 (FIVE) MINUTES X 3 DOSES AS NEEDED FOR CHEST PAIN. 02/05/22   Freada Bergeron, MD  oxyCODONE (ROXICODONE) 5 MG immediate release tablet Take 0.5-1 tablets (2.5-5 mg total) by mouth every 6 (six) hours as needed for severe pain. 05/07/21   Margarita Mail, PA-C  TART CHERRY PO Take 1 capsule by mouth daily.    [provider]      Allergies    Patient has no known allergies.    Review of Systems   Review of Systems  Gastrointestinal:  Positive for abdominal pain.    Physical Exam Updated Vital Signs BP (!) 118/99   Pulse 74   Temp 98.4 F (36.9 C) (Oral)   Resp 19   SpO2 98%  Physical Exam Vitals reviewed.  Constitutional:      General: She is not in acute distress.    Appearance: She is well-developed and normal weight.  HENT:     Head: Normocephalic and atraumatic.     Mouth/Throat:     Mouth:  Mucous membranes are moist.     Pharynx: Oropharynx is clear.  Eyes:     Extraocular Movements: Extraocular movements intact.  Cardiovascular:     Rate and Rhythm: Normal rate and regular rhythm.     Heart sounds: Normal heart sounds.  Pulmonary:     Effort: Pulmonary effort is normal.     Breath sounds: Normal breath sounds.  Abdominal:     General: Abdomen is flat.     Palpations: Abdomen is soft.     Tenderness: There is abdominal tenderness (minimal) in the epigastric area. There is no right CVA tenderness, left CVA tenderness, guarding or rebound.  Skin:    General: Skin is warm and dry.  Neurological:     General: No focal deficit present.     Mental Status: She is alert and oriented to person, place, and time.  Psychiatric:        Mood and Affect: Mood normal.        Behavior: Behavior normal.     ED Results / Procedures / Treatments   Labs (all labs ordered are listed, but only abnormal results are displayed) Labs Reviewed  COMPREHENSIVE METABOLIC PANEL - Abnormal; Notable for the following components:      Result Value   Glucose, Bld 157 (*)    All other components within normal limits  LIPASE, BLOOD - Abnormal; Notable for the following components:   Lipase 73 (*)    All other components within normal limits  URINALYSIS, ROUTINE W REFLEX MICROSCOPIC - Abnormal; Notable for the following components:   APPearance HAZY (*)    Leukocytes,Ua TRACE (*)    All other components within normal limits  CBC WITH DIFFERENTIAL/PLATELET  TROPONIN I (HIGH SENSITIVITY)    EKG EKG Interpretation  Date/Time:  Monday August 06 2022 08:15:40 EST Ventricular Rate:  69 PR Interval:  149 QRS Duration: 106 QT Interval:  411 QTC Calculation: 438 R Axis:   -21 Text Interpretation: Sinus rhythm Borderline left axis deviation Anteroseptal infarct, age indeterminate No significant change since last tracing Confirmed by Elayne Snare ([id number]) on 08/06/2022 1:02:24  PM  Radiology CT ABDOMEN PELVIS W CONTRAST  Result Date: 08/06/2022 CLINICAL DATA:  Nonlocalized abdominal pain EXAM: CT ABDOMEN AND PELVIS WITH CONTRAST TECHNIQUE: Multidetector CT imaging of the abdomen and pelvis was performed using the standard protocol following bolus administration of intravenous contrast. RADIATION DOSE REDUCTION: This exam was performed according to the departmental dose-optimization program which includes automated exposure control, adjustment of the mA and/or kV according to patient size and/or use of iterative reconstruction technique. CONTRAST:  27mL OMNIPAQUE IOHEXOL 350 MG/ML SOLN COMPARISON:  None Available. FINDINGS: Lower Chest: Normal. Hepatobiliary: Normal hepatic contours. No intra- or extrahepatic biliary dilatation. The gallbladder is normal.  Pancreas: Normal pancreas. No ductal dilatation or peripancreatic fluid collection. Spleen: Normal. Adrenals/Urinary Tract: The adrenal glands are normal. No hydronephrosis, nephroureterolithiasis or solid renal mass. Simple right renal cyst measures 4.4 cm (Bosniak class 1, no further imaging recommended). The urinary bladder is normal for degree of distention Stomach/Bowel: Small sliding hiatal hernia. Normal duodenal course and caliber. No small bowel dilatation or inflammation. No focal colonic abnormality. Normal appendix. Vascular/Lymphatic: There is calcific atherosclerosis of the abdominal aorta. No lymphadenopathy. Reproductive: Normal uterus. No adnexal mass. Other: None. Musculoskeletal: No bony spinal canal stenosis or focal osseous abnormality. IMPRESSION: 1. No acute abnormality of the abdomen or pelvis. 2. Small sliding hiatal hernia. Aortic Atherosclerosis (ICD10-I70.0). Electronically Signed   By: Ulyses Jarred M.D.   On: 08/06/2022 03:31   DG Chest 1 View  Result Date: 08/05/2022 CLINICAL DATA:  Chest pain just below the xiphoid process. EXAM: CHEST  1 VIEW COMPARISON:  Two-view chest x-ray 05/07/2021 FINDINGS:  The heart size and mediastinal contours are within normal limits. Both lungs are clear. The visualized skeletal structures are unremarkable. IMPRESSION: Negative one view chest x-ray Electronically Signed   By: San Morelle M.D.   On: 08/05/2022 14:20    Procedures Procedures    Medications Ordered in ED Medications  oxyCODONE-acetaminophen (PERCOCET/ROXICET) 5-325 MG per tablet 1 tablet (1 tablet Oral Not Given 08/06/22 0817)  ondansetron (ZOFRAN-ODT) disintegrating tablet 4 mg (4 mg Oral Given 08/05/22 2231)  iohexol (OMNIPAQUE) 350 MG/ML injection 75 mL (75 mLs Intravenous Contrast Given 08/06/22 0316)  famotidine (PEPCID) tablet 20 mg (20 mg Oral Given 08/06/22 0838)  alum & mag hydroxide-simeth (MAALOX/MYLANTA) 200-200-20 MG/5ML suspension 30 mL (30 mLs Oral Given 08/06/22 2536)    ED Course/ Medical Decision Making/ A&P Clinical Course as of 08/06/22 1325  Mon Aug 06, 2022  1319 On reassessment, the patient has been able to tolerate p.o. feels comfortable with discharge home.  She was given strict return precautions. [VK]    Clinical Course User Index [VK] Kemper Durie, DO                           Medical Decision Making This patient presents to the ED with chief complaint(s) of abdominal pain with pertinent past medical history of CAD s/p CABG which further complicates the presenting complaint. The complaint involves an extensive differential diagnosis and also carries with it a high risk of complications and morbidity.    The differential diagnosis includes atypical ACS, gastritis, GERD, pancreatitis, cholelithiasis, cholecystitis, hepatitis, viral syndrome, dehydration, electrolyte abnormality, anemia/PUD  Additional history obtained: Additional history obtained from N/A Records reviewed recent ED records  ED Course and Reassessment: Patient was initially evaluated by provider in triage and had labs, EKG and CT abdomen and pelvis performed.  She was given Zofran  for symptomatic control.  Patient labs are within normal range including a negative troponin and EKG is nonischemic.  CT abdomen pelvis shows a hiatal hernia otherwise no acute disease.  Patient likely is having severe acid reflux from her hiatal hernia and will be given GI cocktail and p.o. challenged.  Independent labs interpretation:  The following labs were independently interpreted: Within normal range  Independent visualization of imaging: - I independently visualized the following imaging with scope of interpretation limited to determining acute life threatening conditions related to emergency care: CT AP, which revealed hiatal hernia, otherwise no acute disease  Consultation: - Consulted or discussed management/test interpretation w/ external professional:  N/A  Consideration for admission or further workup: Patient has no emergent conditions requiring admission or further work-up at this time and is stable for discharge home with primary care follow-up  Social Determinants of health: N/A    Risk OTC drugs. Prescription drug management.          Final Clinical Impression(s) / ED Diagnoses Final diagnoses:  Epigastric pain  Vomiting without nausea, unspecified vomiting type  Hiatal hernia    Rx / DC Orders ED Discharge Orders          Ordered    famotidine (PEPCID) 20 MG tablet  Daily        08/06/22 1324    alum & mag hydroxide-simeth (MAALOX MAX) 400-400-40 MG/5ML suspension  Every 6 hours PRN        08/06/22 1324    ondansetron (ZOFRAN) 4 MG tablet  Every 6 hours        08/06/22 1324              Elayne Snare K, DO 08/06/22 1325

## 2022-08-06 NOTE — ED Notes (Signed)
Pt given diet coke, peanut butter and graham crackers.

## 2022-08-06 NOTE — Discharge Instructions (Signed)
You were seen in the emergency department for your abdominal pain.  Your workup showed no signs of infection or heart attack and your CT scan shows that you have a hiatal hernia.  Your pain may be coming from severe acid reflux related to the hernia.  I have given you an antacid that you should take daily for at least the next 2 weeks to see if it is helping with your symptoms and you can take Maalox and Zofran as needed for additional pain or nausea.  You should follow-up with your primary doctor in the next few days to have your symptoms rechecked and you can follow-up with GI as needed.  You should return to the emergency department if you are having recurrent vomiting despite the nausea medication, significantly worsening abdominal pain, fevers or any other new or concerning symptoms.

## 2022-08-07 ENCOUNTER — Other Ambulatory Visit: Payer: Self-pay | Admitting: *Deleted

## 2022-08-07 MED ORDER — METOPROLOL SUCCINATE ER 25 MG PO TB24: 12.5000 mg | ORAL_TABLET | Freq: Every day | ORAL | 1 refills | Status: DC

## 2022-08-08 ENCOUNTER — Encounter: Payer: Self-pay | Admitting: Cardiology

## 2022-08-09 ENCOUNTER — Inpatient Hospital Stay (HOSPITAL_COMMUNITY)
Admission: EM | Admit: 2022-08-09 | Discharge: 2022-08-12 | DRG: 417 | Disposition: A | Discharge status: Home / Self Care | Payer: Medicare HMO | Attending: Internal Medicine | Admitting: Internal Medicine

## 2022-08-09 ENCOUNTER — Other Ambulatory Visit: Payer: Self-pay

## 2022-08-09 ENCOUNTER — Emergency Department (HOSPITAL_COMMUNITY): Payer: Medicare HMO

## 2022-08-09 ENCOUNTER — Encounter (HOSPITAL_COMMUNITY): Payer: Self-pay

## 2022-08-09 DIAGNOSIS — K449 Diaphragmatic hernia without obstruction or gangrene: Secondary | ICD-10-CM | POA: Diagnosis present

## 2022-08-09 DIAGNOSIS — K819 Cholecystitis, unspecified: Secondary | ICD-10-CM | POA: Diagnosis not present

## 2022-08-09 DIAGNOSIS — N281 Cyst of kidney, acquired: Secondary | ICD-10-CM | POA: Diagnosis not present

## 2022-08-09 DIAGNOSIS — Z7982 Long term (current) use of aspirin: Secondary | ICD-10-CM | POA: Diagnosis not present

## 2022-08-09 DIAGNOSIS — R935 Abnormal findings on diagnostic imaging of other abdominal regions, including retroperitoneum: Secondary | ICD-10-CM | POA: Diagnosis not present

## 2022-08-09 DIAGNOSIS — Z951 Presence of aortocoronary bypass graft: Secondary | ICD-10-CM

## 2022-08-09 DIAGNOSIS — K851 Biliary acute pancreatitis without necrosis or infection: Secondary | ICD-10-CM | POA: Diagnosis present

## 2022-08-09 DIAGNOSIS — K824 Cholesterolosis of gallbladder: Secondary | ICD-10-CM | POA: Diagnosis not present

## 2022-08-09 DIAGNOSIS — R1011 Right upper quadrant pain: Secondary | ICD-10-CM | POA: Diagnosis not present

## 2022-08-09 DIAGNOSIS — I252 Old myocardial infarction: Secondary | ICD-10-CM

## 2022-08-09 DIAGNOSIS — K8000 Calculus of gallbladder with acute cholecystitis without obstruction: Secondary | ICD-10-CM | POA: Diagnosis present

## 2022-08-09 DIAGNOSIS — K859 Acute pancreatitis without necrosis or infection, unspecified: Secondary | ICD-10-CM | POA: Diagnosis present

## 2022-08-09 DIAGNOSIS — I2510 Atherosclerotic heart disease of native coronary artery without angina pectoris: Secondary | ICD-10-CM | POA: Diagnosis present

## 2022-08-09 DIAGNOSIS — I25119 Atherosclerotic heart disease of native coronary artery with unspecified angina pectoris: Secondary | ICD-10-CM | POA: Diagnosis not present

## 2022-08-09 DIAGNOSIS — K8020 Calculus of gallbladder without cholecystitis without obstruction: Secondary | ICD-10-CM | POA: Diagnosis not present

## 2022-08-09 DIAGNOSIS — E785 Hyperlipidemia, unspecified: Secondary | ICD-10-CM | POA: Diagnosis present

## 2022-08-09 DIAGNOSIS — K7689 Other specified diseases of liver: Secondary | ICD-10-CM | POA: Diagnosis not present

## 2022-08-09 DIAGNOSIS — K219 Gastro-esophageal reflux disease without esophagitis: Secondary | ICD-10-CM | POA: Diagnosis present

## 2022-08-09 DIAGNOSIS — Z955 Presence of coronary angioplasty implant and graft: Secondary | ICD-10-CM

## 2022-08-09 DIAGNOSIS — Z79899 Other long term (current) drug therapy: Secondary | ICD-10-CM

## 2022-08-09 DIAGNOSIS — I2583 Coronary atherosclerosis due to lipid rich plaque: Secondary | ICD-10-CM | POA: Diagnosis not present

## 2022-08-09 DIAGNOSIS — R109 Unspecified abdominal pain: Principal | ICD-10-CM

## 2022-08-09 DIAGNOSIS — I10 Essential (primary) hypertension: Secondary | ICD-10-CM | POA: Diagnosis present

## 2022-08-09 DIAGNOSIS — R1013 Epigastric pain: Secondary | ICD-10-CM | POA: Diagnosis not present

## 2022-08-09 DIAGNOSIS — K8010 Calculus of gallbladder with chronic cholecystitis without obstruction: Secondary | ICD-10-CM | POA: Diagnosis not present

## 2022-08-09 LAB — CBC WITH DIFFERENTIAL/PLATELET
Abs Immature Granulocytes: 0.04 K/uL (ref 0.00–0.07)
Basophils Absolute: 0 K/uL (ref 0.0–0.1)
Basophils Relative: 0 %
Eosinophils Absolute: 0.1 K/uL (ref 0.0–0.5)
Eosinophils Relative: 1 %
HCT: 45.6 % (ref 36.0–46.0)
Hemoglobin: 14.9 g/dL (ref 12.0–15.0)
Immature Granulocytes: 0 %
Lymphocytes Relative: 10 %
Lymphs Abs: 1.1 K/uL (ref 0.7–4.0)
MCH: 31.8 pg (ref 26.0–34.0)
MCHC: 32.7 g/dL (ref 30.0–36.0)
MCV: 97.4 fL (ref 80.0–100.0)
Monocytes Absolute: 0.8 K/uL (ref 0.1–1.0)
Monocytes Relative: 7 %
Neutro Abs: 9.1 K/uL — ABNORMAL HIGH (ref 1.7–7.7)
Neutrophils Relative %: 82 %
Platelets: 225 K/uL (ref 150–400)
RBC: 4.68 MIL/uL (ref 3.87–5.11)
RDW: 11.9 % (ref 11.5–15.5)
WBC: 11 K/uL — ABNORMAL HIGH (ref 4.0–10.5)
nRBC: 0 % (ref 0.0–0.2)

## 2022-08-09 LAB — COMPREHENSIVE METABOLIC PANEL
ALT: 185 U/L — ABNORMAL HIGH (ref 0–44)
AST: 285 U/L — ABNORMAL HIGH (ref 15–41)
Albumin: 4 g/dL (ref 3.5–5.0)
Alkaline Phosphatase: 123 U/L (ref 38–126)
Anion gap: 10 (ref 5–15)
BUN: 18 mg/dL (ref 8–23)
CO2: 24 mmol/L (ref 22–32)
Calcium: 9 mg/dL (ref 8.9–10.3)
Chloride: 105 mmol/L (ref 98–111)
Creatinine, Ser: 1.02 mg/dL — ABNORMAL HIGH (ref 0.44–1.00)
GFR, Estimated: 58 mL/min — ABNORMAL LOW (ref >60)
Glucose, Bld: 104 mg/dL — ABNORMAL HIGH (ref 70–99)
Potassium: 4.3 mmol/L (ref 3.5–5.1)
Sodium: 139 mmol/L (ref 135–145)
Total Bilirubin: 1.8 mg/dL — ABNORMAL HIGH (ref 0.3–1.2)
Total Protein: 7.2 g/dL (ref 6.5–8.1)

## 2022-08-09 LAB — URINALYSIS, ROUTINE W REFLEX MICROSCOPIC
Bacteria, UA: NONE SEEN
Bilirubin Urine: NEGATIVE
Glucose, UA: NEGATIVE mg/dL
Hgb urine dipstick: NEGATIVE
Ketones, ur: NEGATIVE mg/dL
Nitrite: NEGATIVE
Protein, ur: NEGATIVE mg/dL
Specific Gravity, Urine: 1.017 (ref 1.005–1.030)
pH: 6 (ref 5.0–8.0)

## 2022-08-09 LAB — TROPONIN I (HIGH SENSITIVITY)
Troponin I (High Sensitivity): 6 ng/L (ref <18)
Troponin I (High Sensitivity): 7 ng/L (ref <18)

## 2022-08-09 LAB — LIPASE, BLOOD: Lipase: 1857 U/L — ABNORMAL HIGH (ref 11–51)

## 2022-08-09 MED ORDER — LACTATED RINGERS IV SOLN: INTRAVENOUS | Status: DC

## 2022-08-09 NOTE — ED Triage Notes (Signed)
Pt c/o 3/10 constant severe epigastric pain, vomiting started today. Pt denies SOB, weakness, dizziness

## 2022-08-09 NOTE — ED Provider Triage Note (Signed)
Emergency Medicine Provider Triage Evaluation Note  Brenda Wilkins , a 77 y.o. female  was evaluated in triage.  Pt complains of severe epigastric abdominal pain onset 5 days. Pain has been intermittent. Pain started again at noon today. Was evaluated in the ED at that time. Has associated nausea, vomiting. Denies urinary symptoms. Found to have a hiatal hernia on CT scan. Told to follow up with her PCP. Was given maalox. Pain has resolved at this time.   Review of Systems  Positive:  Negative:   Physical Exam  BP (!) 117/103 (BP Location: Right Arm)   Pulse 83   Temp 98.5 F (36.9 C) (Oral)   Resp 16   Ht 4' 11.5" (1.511 m)   Wt 67.1 kg   SpO2 100%   BMI 29.38 kg/m  Gen:   Awake, no distress   Resp:  Normal effort  MSK:   Moves extremities without difficulty  Other:  No abdominal tenderness to palpation  Medical Decision Making  Medically screening exam initiated at 6:08 PM.  Appropriate orders placed.  Brenda Wilkins was informed that the remainder of the evaluation will be completed by another provider, this initial triage assessment does not replace that evaluation, and the importance of remaining in the ED until their evaluation is complete.     Alvira Hecht A, PA-C 08/09/22 1813

## 2022-08-10 ENCOUNTER — Telehealth: Payer: Self-pay | Admitting: *Deleted

## 2022-08-10 ENCOUNTER — Observation Stay (HOSPITAL_COMMUNITY): Payer: Medicare HMO

## 2022-08-10 ENCOUNTER — Other Ambulatory Visit: Payer: Self-pay

## 2022-08-10 DIAGNOSIS — E785 Hyperlipidemia, unspecified: Secondary | ICD-10-CM | POA: Diagnosis present

## 2022-08-10 DIAGNOSIS — Z955 Presence of coronary angioplasty implant and graft: Secondary | ICD-10-CM | POA: Diagnosis not present

## 2022-08-10 DIAGNOSIS — K8020 Calculus of gallbladder without cholecystitis without obstruction: Secondary | ICD-10-CM | POA: Diagnosis not present

## 2022-08-10 DIAGNOSIS — Z951 Presence of aortocoronary bypass graft: Secondary | ICD-10-CM | POA: Diagnosis not present

## 2022-08-10 DIAGNOSIS — I252 Old myocardial infarction: Secondary | ICD-10-CM | POA: Diagnosis not present

## 2022-08-10 DIAGNOSIS — I10 Essential (primary) hypertension: Secondary | ICD-10-CM | POA: Diagnosis present

## 2022-08-10 DIAGNOSIS — I25119 Atherosclerotic heart disease of native coronary artery with unspecified angina pectoris: Secondary | ICD-10-CM | POA: Diagnosis not present

## 2022-08-10 DIAGNOSIS — K219 Gastro-esophageal reflux disease without esophagitis: Secondary | ICD-10-CM | POA: Diagnosis present

## 2022-08-10 DIAGNOSIS — K851 Biliary acute pancreatitis without necrosis or infection: Secondary | ICD-10-CM | POA: Diagnosis present

## 2022-08-10 DIAGNOSIS — I2510 Atherosclerotic heart disease of native coronary artery without angina pectoris: Secondary | ICD-10-CM | POA: Diagnosis present

## 2022-08-10 DIAGNOSIS — K819 Cholecystitis, unspecified: Secondary | ICD-10-CM | POA: Diagnosis not present

## 2022-08-10 DIAGNOSIS — K859 Acute pancreatitis without necrosis or infection, unspecified: Secondary | ICD-10-CM | POA: Diagnosis present

## 2022-08-10 DIAGNOSIS — Z79899 Other long term (current) drug therapy: Secondary | ICD-10-CM | POA: Diagnosis not present

## 2022-08-10 DIAGNOSIS — K449 Diaphragmatic hernia without obstruction or gangrene: Secondary | ICD-10-CM | POA: Diagnosis present

## 2022-08-10 DIAGNOSIS — Z7982 Long term (current) use of aspirin: Secondary | ICD-10-CM | POA: Diagnosis not present

## 2022-08-10 DIAGNOSIS — I2583 Coronary atherosclerosis due to lipid rich plaque: Secondary | ICD-10-CM | POA: Diagnosis not present

## 2022-08-10 DIAGNOSIS — K8000 Calculus of gallbladder with acute cholecystitis without obstruction: Secondary | ICD-10-CM | POA: Diagnosis present

## 2022-08-10 DIAGNOSIS — K7689 Other specified diseases of liver: Secondary | ICD-10-CM | POA: Diagnosis not present

## 2022-08-10 DIAGNOSIS — R935 Abnormal findings on diagnostic imaging of other abdominal regions, including retroperitoneum: Secondary | ICD-10-CM | POA: Diagnosis not present

## 2022-08-10 DIAGNOSIS — N281 Cyst of kidney, acquired: Secondary | ICD-10-CM | POA: Diagnosis not present

## 2022-08-10 LAB — COMPREHENSIVE METABOLIC PANEL
ALT: 191 U/L — ABNORMAL HIGH (ref 0–44)
AST: 139 U/L — ABNORMAL HIGH (ref 15–41)
Albumin: 3.5 g/dL (ref 3.5–5.0)
Alkaline Phosphatase: 122 U/L (ref 38–126)
Anion gap: 8 (ref 5–15)
BUN: 15 mg/dL (ref 8–23)
CO2: 26 mmol/L (ref 22–32)
Calcium: 8.7 mg/dL — ABNORMAL LOW (ref 8.9–10.3)
Chloride: 106 mmol/L (ref 98–111)
Creatinine, Ser: 0.99 mg/dL (ref 0.44–1.00)
GFR, Estimated: 60 mL/min — ABNORMAL LOW (ref >60)
Glucose, Bld: 97 mg/dL (ref 70–99)
Potassium: 4 mmol/L (ref 3.5–5.1)
Sodium: 140 mmol/L (ref 135–145)
Total Bilirubin: 0.9 mg/dL (ref 0.3–1.2)
Total Protein: 6.3 g/dL — ABNORMAL LOW (ref 6.5–8.1)

## 2022-08-10 LAB — CBC
HCT: 40.4 % (ref 36.0–46.0)
Hemoglobin: 13.3 g/dL (ref 12.0–15.0)
MCH: 31.7 pg (ref 26.0–34.0)
MCHC: 32.9 g/dL (ref 30.0–36.0)
MCV: 96.4 fL (ref 80.0–100.0)
Platelets: 176 10*3/uL (ref 150–400)
RBC: 4.19 MIL/uL (ref 3.87–5.11)
RDW: 12 % (ref 11.5–15.5)
WBC: 5.6 10*3/uL (ref 4.0–10.5)
nRBC: 0 % (ref 0.0–0.2)

## 2022-08-10 LAB — SURGICAL PCR SCREEN
MRSA, PCR: NEGATIVE
Staphylococcus aureus: NEGATIVE

## 2022-08-10 LAB — LIPASE, BLOOD
Lipase: 118 U/L — ABNORMAL HIGH (ref 11–51)
Lipase: 81 U/L — ABNORMAL HIGH (ref 11–51)

## 2022-08-10 MED ORDER — ONDANSETRON HCL 4 MG/2ML IJ SOLN: 4.0000 mg | Freq: Four times a day (QID) | INTRAMUSCULAR | Status: DC | PRN

## 2022-08-10 MED ORDER — GADOBUTROL 1 MMOL/ML IV SOLN
7.0000 mL | Freq: Once | INTRAVENOUS | Status: AC | PRN
  Administered 2022-08-10: 7 mL via INTRAVENOUS

## 2022-08-10 MED ORDER — MORPHINE SULFATE (PF) 2 MG/ML IV SOLN: 1.0000 mg | INTRAVENOUS | Status: DC | PRN

## 2022-08-10 MED ORDER — FEROCON PO CAPS: 1.0000 | ORAL_CAPSULE | Freq: Two times a day (BID) | ORAL | 3 refills | Status: DC

## 2022-08-10 MED ORDER — NALOXONE HCL 0.4 MG/ML IJ SOLN: 0.4000 mg | INTRAMUSCULAR | Status: DC | PRN

## 2022-08-10 NOTE — ED Notes (Signed)
GI at bedside

## 2022-08-10 NOTE — ED Notes (Signed)
Just arrived by from MR

## 2022-08-10 NOTE — H&P (Signed)
History and Physical    Chela Turrell [id number] DOB: 1946/06/08 DOA: 08/09/2022  PCP: Patient, No Pcp Per  Patient coming from: Home  Chief Complaint: Abdominal pain, vomiting  HPI: Sander Stogner is a 77 y.o. female with medical history significant of CAD status post CABG and PCI, hypertension, hyperlipidemia, GERD presented to the ED with epigastric abdominal pain, nausea, and vomiting.  Vital signs stable.  Labs significant for WBC 11.0, creatinine 1.0, AST 285, ALT 185, alk phos 123, T. bili 1.8, UA not suggestive of infection, troponin negative x 2.  Right upper quadrant ultrasound showing mildly echogenic focus in the gallbladder which may represent a poorly calcified stone or a small polyp. ED physician discussed the case with Dr. Grandville Silos with general surgery who recommended GI involvement.  General surgery will consult in the morning.  [facility] (Dr. Tarri Glenn) consulted and recommended MRCP.  Patient was given IV fluids.  TRH called to admit.  Patient is reporting epigastric abdominal pain and vomiting x 5 days, multiple recent ED visits.  States after her last ED her symptoms improved and she was able to tolerate p.o. intake.  She ate breakfast yesterday but around noon started experiencing symptoms again and since then has not been able to eat.  She is reporting constant severe epigastric abdominal pain.  Denies history of alcohol use or gallstones.  Denies shortness of breath or chest pain.  Review of Systems:  Review of Systems  All other systems reviewed and are negative.   Past Medical History:  Diagnosis Date   Coronary artery disease    Hyperlipidemia    Hypertension    NSTEMI (non-ST elevated myocardial infarction) ([place]) 07/16/2020   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    Past Surgical History:  Procedure Laterality Date   CARDIAC CATHETERIZATION  08/08/2020   CORONARY ARTERY BYPASS GRAFT N/A 07/18/2020   Procedure: CORONARY ARTERY BYPASS GRAFTING (CABG), ON PUMP, TIMES THREE,  USING ENDOSCOPICALLY HARVESTED RIGHT GREATER SAPHENOUS VEIN;  Surgeon: Gaye Pollack, MD;  Location: [facility];  Service: Open Heart Surgery;  Laterality: N/A;   CORONARY STENT INTERVENTION N/A 10/03/2020   Procedure: CORONARY STENT INTERVENTION;  Surgeon: Lorretta Harp, MD;  Location: [facility];  Service: Cardiovascular;  Laterality: N/A;   CORONARY/GRAFT ACUTE MI REVASCULARIZATION N/A 07/18/2020   Procedure: Coronary/Graft Acute MI Revascularization;  Surgeon: Martinique, Peter M, MD;  Location: [facility];  Service: Cardiovascular;  Laterality: N/A;   INTRAVASCULAR PRESSURE WIRE/FFR STUDY N/A 10/03/2020   Procedure: INTRAVASCULAR PRESSURE WIRE/FFR STUDY;  Surgeon: Lorretta Harp, MD;  Location: [facility];  Service: Cardiovascular;  Laterality: N/A;   LEFT HEART CATH AND CORONARY ANGIOGRAPHY N/A 07/18/2020   Procedure: LEFT HEART CATH AND CORONARY ANGIOGRAPHY;  Surgeon: Sherren Mocha, MD;  Location: [facility];  Service: Cardiovascular;  Laterality: N/A;   LEFT HEART CATH AND CORS/GRAFTS ANGIOGRAPHY N/A 08/08/2020   Procedure: LEFT HEART CATH AND CORS/GRAFTS ANGIOGRAPHY;  Surgeon: Troy Sine, MD;  Location: [facility];  Service: Cardiovascular;  Laterality: N/A;   LEFT HEART CATH AND CORS/GRAFTS ANGIOGRAPHY N/A 10/03/2020   Procedure: LEFT HEART CATH AND CORS/GRAFTS ANGIOGRAPHY;  Surgeon: Lorretta Harp, MD;  Location: [facility];  Service: Cardiovascular;  Laterality: N/A;   TEE WITHOUT CARDIOVERSION N/A 07/18/2020   Procedure: TRANSESOPHAGEAL ECHOCARDIOGRAM (TEE);  Surgeon: Gaye Pollack, MD;  Location: [facility];  Service: Open Heart Surgery;  Laterality: N/A;     reports that  she has never smoked. She has never used smokeless tobacco. She reports that she does not drink alcohol and does not use drugs.  No Known Allergies  History reviewed. No pertinent family history.  Prior to Admission medications   Medication Sig Start Date End  Date Taking? Authorizing Provider  acetaminophen (TYLENOL) 325 MG tablet Take 325-650 mg by mouth every 6 (six) hours as needed for mild pain (or headaches).    [provider]  alum & mag hydroxide-simeth (MAALOX MAX) 400-400-40 MG/5ML suspension Take 15 mLs by mouth every 6 (six) hours as needed for indigestion. 08/06/22   Kemper Durie, DO  aspirin 81 MG EC tablet Take 1 tablet (81 mg total) by mouth daily. Swallow whole. 12/04/21   Freada Bergeron, MD  atorvastatin (LIPITOR) 80 MG tablet Take 1 tablet (80 mg total) by mouth daily. 02/08/22   Freada Bergeron, MD  Cholecalciferol (VITAMIN D) 50 MCG (2000 UT) CAPS Take 1 capsule by mouth daily.    [provider]  clopidogrel (PLAVIX) 75 MG tablet Take 1 tablet (75 mg total) by mouth daily. 03/01/22   Freada Bergeron, MD  colchicine 0.6 MG tablet Take 1 tablet (0.6 mg total) by mouth as needed (As needed for gout flare). 04/05/21   Loel Dubonnet, NP  ezetimibe (ZETIA) 10 MG tablet Take 1 tablet (10 mg total) by mouth daily. 02/08/22   Freada Bergeron, MD  famotidine (PEPCID) 20 MG tablet Take 1 tablet (20 mg total) by mouth daily. 08/06/22   Kemper Durie, DO  ferrous [id number]-WCHENID C-folic acid (FEROCON) capsule Take 1 capsule by mouth 2 (two) times daily. 11/27/21   Freada Bergeron, MD  metoCLOPramide (REGLAN) 10 MG tablet Take 1 tablet (10 mg total) by mouth every 6 (six) hours as needed for nausea (nausea/headache). 05/07/21   Margarita Mail, PA-C  metoprolol succinate (TOPROL-XL) 25 MG 24 hr tablet Take 0.5 tablets (12.5 mg total) by mouth daily. 08/07/22   Freada Bergeron, MD  nitroGLYCERIN (NITROSTAT) 0.4 MG SL tablet PLACE 1 TABLET (0.4 MG TOTAL) UNDER THE TONGUE EVERY 5 (FIVE) MINUTES X 3 DOSES AS NEEDED FOR CHEST PAIN. 02/05/22   Freada Bergeron, MD  ondansetron (ZOFRAN) 4 MG tablet Take 1 tablet (4 mg total) by mouth every 6 (six) hours. 08/06/22   Leanord Asal K, DO   oxyCODONE (ROXICODONE) 5 MG immediate release tablet Take 0.5-1 tablets (2.5-5 mg total) by mouth every 6 (six) hours as needed for severe pain. 05/07/21   Harris, Abigail, PA-C  TART CHERRY PO Take 1 capsule by mouth daily.    [provider]    Physical Exam: Vitals:   08/10/22 0400 08/10/22 0424 08/10/22 0428 08/10/22 0515  BP: 125/62 124/62  114/68  Pulse: 64 64  61  Resp:  18    Temp:   98.1 F (36.7 C)   TempSrc:   Oral   SpO2: 96% 96%  97%  Weight:      Height:        Physical Exam Vitals reviewed.  Constitutional:      General: She is not in acute distress. HENT:     Head: Normocephalic and atraumatic.  Eyes:     Extraocular Movements: Extraocular movements intact.  Cardiovascular:     Rate and Rhythm: Normal rate and regular rhythm.     Pulses: Normal pulses.  Pulmonary:     Effort: Pulmonary effort is normal. No respiratory distress.  Breath sounds: Normal breath sounds. No wheezing or rales.  Abdominal:     General: Bowel sounds are normal. There is no distension.     Palpations: Abdomen is soft.     Tenderness: There is abdominal tenderness. There is no guarding or rebound.     Comments: Mild epigastric tenderness  Musculoskeletal:     Cervical back: Normal range of motion.     Right lower leg: No edema.     Left lower leg: No edema.  Skin:    General: Skin is warm and dry.  Neurological:     General: No focal deficit present.     Mental Status: She is alert and oriented to person, place, and time.     Labs on Admission: I have personally reviewed following labs and imaging studies  CBC: Recent Labs  Lab 08/05/22 1346 08/05/22 2223 08/09/22 1745  WBC 8.8 8.4 11.0*  NEUTROABS  --  5.1 9.1*  HGB 15.0 14.9 14.9  HCT 47.4* 45.1 45.6  MCV 100.6* 96.6 97.4  PLT 217 227 [id number]   Basic Metabolic Panel: Recent Labs  Lab 08/05/22 1346 08/05/22 2223 08/09/22 1745  NA 140 141 139  K 4.2 4.1 4.3  CL 110 107 105  CO2 19* 26 24  GLUCOSE  98 157* 104*  BUN 20 20 18   CREATININE 0.86 0.95 1.02*  CALCIUM 8.8* 8.9 9.0   GFR: Estimated Creatinine Clearance: 40.9 mL/min (A) (by C-G formula based on SCr of 1.02 mg/dL (H)). Liver Function Tests: Recent Labs  Lab 08/05/22 1346 08/05/22 2223 08/09/22 1745  AST 27 28 285*  ALT 26 27 185*  ALKPHOS 93 91 123  BILITOT 0.6 0.4 1.8*  PROT 6.9 7.1 7.2  ALBUMIN 3.9 4.0 4.0   Recent Labs  Lab 08/05/22 1346 08/05/22 2223 08/09/22 1745  LIPASE 79* 73* 1,857*   No results for input(s): "AMMONIA" in the last 168 hours. Coagulation Profile: No results for input(s): "INR", "PROTIME" in the last 168 hours. Cardiac Enzymes: No results for input(s): "CKTOTAL", "CKMB", "CKMBINDEX", "TROPONINI" in the last 168 hours. BNP (last 3 results) No results for input(s): "PROBNP" in the last 8760 hours. HbA1C: No results for input(s): "HGBA1C" in the last 72 hours. CBG: No results for input(s): "GLUCAP" in the last 168 hours. Lipid Profile: No results for input(s): "CHOL", "HDL", "LDLCALC", "TRIG", "CHOLHDL", "LDLDIRECT" in the last 72 hours. Thyroid Function Tests: No results for input(s): "TSH", "T4TOTAL", "FREET4", "T3FREE", "THYROIDAB" in the last 72 hours. Anemia Panel: No results for input(s): "VITAMINB12", "FOLATE", "FERRITIN", "TIBC", "IRON", "RETICCTPCT" in the last 72 hours. Urine analysis:    Component Value Date/Time   COLORURINE YELLOW 08/09/2022 1711   APPEARANCEUR HAZY (A) 08/09/2022 1711   LABSPEC 1.017 08/09/2022 1711   PHURINE 6.0 08/09/2022 1711   GLUCOSEU NEGATIVE 08/09/2022 1711   HGBUR NEGATIVE 08/09/2022 1711   BILIRUBINUR NEGATIVE 08/09/2022 1711   KETONESUR NEGATIVE 08/09/2022 1711   PROTEINUR NEGATIVE 08/09/2022 1711   NITRITE NEGATIVE 08/09/2022 1711   LEUKOCYTESUR TRACE (A) 08/09/2022 1711    Radiological Exams on Admission: US Abdomen Limited RUQ (LIVER/GB)  Result Date: 08/09/2022 CLINICAL DATA:  Right upper quadrant pain EXAM: ULTRASOUND ABDOMEN  LIMITED RIGHT UPPER QUADRANT COMPARISON:  CT from 08/06/2022 FINDINGS: Gallbladder: Gallbladder is well distended. Echogenic focus is noted without mobility which may represent a small adherent stone without significant calcification. Small polyp would deserve consideration as well. No other filling defects are noted. Common bile duct: Diameter: 5.7 mm Liver: No  focal lesion identified. Within normal limits in parenchymal echogenicity. Portal vein is patent on color Doppler imaging with normal direction of blood flow towards the liver. Other: Incidental note is made of 3.7 cm simple appearing right renal cyst. IMPRESSION: Mildly echogenic focus in calcification in the gallbladder. This may represent a poorly calcified stone or small polyp. Simple renal cyst.  No follow-up is recommended. Electronically Signed   By: Inez Catalina M.D.   On: 08/09/2022 23:38    EKG: Independently reviewed.  Sinus rhythm, incomplete RBBB.  No significant change compared to prior tracings.  Assessment and Plan  Acute pancreatitis Patient presenting with severe epigastric abdominal pain and vomiting x 5 days.  Troponin negative x 2 and not consistent with ACS.  Multiple recent ED visits for these symptoms and CT abdomen pelvis done 4 days ago negative for acute finding.  Lipase was in the 70s and liver enzymes normal on recent labs.  Labs now showing lipase increasing to 1857 and elevated LFTs (AST 285, ALT 185, alk phos 123, T. bili 1.8). Right upper quadrant ultrasound showing mildly echogenic focus in the gallbladder which may represent a poorly calcified stone or a small polyp, CBD 5.7 mm in diameter. ?Gallstone pancreatitis. WBC count 11.0, no fever or signs of cholangitis.  -[facility] and general surgery consulted, appreciate recommendations -MRCP ordered -Keep n.p.o. -Continue IV fluids and monitor volume status closely. Echo done January 2022 showing EF 50 to 55% and grade 1 diastolic dysfunction. -Pain  management -Antiemetic as needed -Trend lipase, LFTs, WBC count  CAD status post CABG and PCI: Workup not suggestive of ACS. Hypertension: Blood pressure stable Hyperlipidemia GERD  -Pharmacy med rec pending.  DVT prophylaxis: SCDs Code Status: Full Code (discussed with the patient) Family Communication: No family available at this time. Consults called: GI and general surgery Level of care: Telemetry bed Admission status: It is my clinical opinion that referral for OBSERVATION is reasonable and necessary in this patient based on the above information provided. The aforementioned taken together are felt to place the patient at high risk for further clinical deterioration. However, it is anticipated that the patient may be medically stable for discharge from the hospital within 24 to 48 hours.   Shela Leff MD [facility]  If 7PM-7AM, please contact night-coverage [email or web address]  08/10/2022, 5:24 AM

## 2022-08-10 NOTE — Anesthesia Preprocedure Evaluation (Signed)
Anesthesia Evaluation  Patient identified by MRN, date of birth, ID band Patient awake    Reviewed: Allergy & Precautions, NPO status , Patient's Chart, lab work & pertinent test results  Airway Mallampati: II  TM Distance: >3 FB Neck ROM: Full    Dental no notable dental hx.    Pulmonary neg pulmonary ROS   Pulmonary exam normal breath sounds clear to auscultation       Cardiovascular hypertension, Pt. on home beta blockers + angina  + CAD, + Past MI, + Cardiac Stents and + CABG  Normal cardiovascular exam Rhythm:Regular Rate:Normal  Echo 08/2020  1. Septal hypokinesis consistent with post-operative state. Left ventricular ejection fraction, by estimation, is 50 to 55%. The left ventricle has low normal function. The left ventricle demonstrates regional wall motion abnormalities (see scoring diagram/findings for description). Left ventricular diastolic parameters are consistent with Grade I diastolic dysfunction (impaired relaxation).   2. Right ventricular systolic function is normal. The right ventricular size is normal.   3. The mitral valve is normal in structure. Trivial mitral valve regurgitation. No evidence of mitral stenosis.   4. The aortic valve is tricuspid. Aortic valve regurgitation is not visualized. No aortic stenosis is present.   5. The inferior vena cava is normal in size with greater than 50% respiratory variability, suggesting right atrial pressure of 3 mmHg.    LHC 09/2020 Origin lesion is 100% stenosed. Ost Cx to Prox Cx lesion is 80% stenosed. Ost LAD to Mid LAD lesion is 100% stenosed. Origin to Prox Graft lesion before 1st Diag is 95% stenosed. Origin to Prox Graft lesion between 1st Diag and 3rd Mrg is 100% stenosed. A drug-eluting stent was successfully placed using a STENT RESOLUTE ONYX 4.5X22. Post intervention, there is a 10% residual stenosis. The left ventricular ejection fraction is 50-55%  by visual estimate. The left ventricular systolic function is normal. LV end diastolic pressure is normal.    Neuro/Psych negative neurological ROS     GI/Hepatic negative GI ROS, Neg liver ROS,,,  Endo/Other  negative endocrine ROS    Renal/GU negative Renal ROS     Musculoskeletal negative musculoskeletal ROS (+)    Abdominal   Peds  Hematology negative hematology ROS (+)   Anesthesia Other Findings   Reproductive/Obstetrics                             Anesthesia Physical Anesthesia Plan  ASA: 3  Anesthesia Plan: General   Post-op Pain Management: Ofirmev IV (intra-op)*   Induction: Intravenous  PONV Risk Score and Plan: 4 or greater and Ondansetron, Dexamethasone, Treatment may vary due to age or medical condition and Midazolam  Airway Management Planned: Oral ETT  Additional Equipment:   Intra-op Plan:   Post-operative Plan: Extubation in OR  Informed Consent: I have reviewed the patients History and Physical, chart, labs and discussed the procedure including the risks, benefits and alternatives for the proposed anesthesia with the patient or authorized representative who has indicated his/her understanding and acceptance.     Dental advisory given  Plan Discussed with: CRNA  Anesthesia Plan Comments:        Anesthesia Quick Evaluation

## 2022-08-10 NOTE — Telephone Encounter (Signed)
     Patient  visit on 08/06/2022  at [facility]  was for abdominal pain   Have you been able to follow up with your primary care physician? This is her 3rd visit in since sunday , Will admitted for surgery  The patient was able to obtain any needed medicine or equipment.  Are there diet recommendations that you are having difficulty following?  Patient expresses understanding of discharge instructions and education provided has no other needs at this time.   [place] [phone] [address] , [place] [address] Email : [email or web address]

## 2022-08-10 NOTE — Consult Note (Signed)
Consultation  Referring Provider: [facility]   Primary Care Physician:  Patient, No Pcp Per Primary Gastroenterologist:  Althia Forts       Reason for Consultation:    Ab pain, elevated LFTs, elevated lipase    Impression     Acute cholecystitis  Seen on MRCP without any ductal dilatation or choledocholithiasis. white blood cell count 11, creatinine 1, AST 285, ALT 185, alk phos 123, total bili 1.8  Gallstone pancreatitis Lipase lipase 1857 No evidence of pancreatic or peripancreatic edema on MRCP No personal or family history of pancreatitis, triglycerides normal 11/2021, no new medications other than Pepcid.  Leukocytosis Afebrile, question reactive.  Monitor  Coronary artery disease status post bypass x 3 with occluded grafts status post PCI 09/25/2020 On Plavix and aspirin No chest pain or shortness of breath  Screening colonoscopy No symptoms, no family history of patient's never had screening colonoscopy.   LOS: 0 days     Plan   Unremarkable bilirubin, MRCP does show acute cholecystitis, possible passed gallstone but currently without choledocholithiasis. Interestingly enough does not show pancreatitis on MRCP will repeat lipase Suggest general surgery consult this admission for possible cholecystectomy - NPO until after general surgery sees patient -Continue IVF -Pain control per the inpatient medical team. -Encourage early ambulation  - needs outpatient colonoscopy for screening  Thank you for your kind consultation, we will continue to follow.         HPI:   Brenda Wilkins is a 77 y.o. female with past medical history significant for CAD status post CABG 08/05/2020 with subsequent non-STEMI 08/07/2020 and 09/29/2020 with multiple occluded grafts status post PCI SVG to diagonal and managed medically, developed right groin hematoma and pseudoaneurysm 10/02/2020 on Plavix and low-dose aspirin, hypertension, hyperlipidemia, GERD presenting to ED with epigastric  abdominal pain, nausea and vomiting.  Daughter, Estill Bamberg, in the room during the history states had a pain October 2022 similar with vomiting that resolved. Then Sunday after eating breakfast patient had severe abdominal pain with vomiting went to the ER.  Was discharged home, had similar episode 2 hours later after eating a sandwich with pain and vomiting.  Proceed back to the ER was here in the ER New Year's Eve with unremarkable labs and CT abdomen pelvis. 08/06/2022 CT abdomen pelvis for nonlocalized abdominal pain normal hepatic contours, no intra or extrahepatic biliary dilatation, gallbladder normal, normal pancreas small sliding hiatal hernia. Patient was started on Pepcid at that time and told to eat small frequent meals thought it was potentially a hiatal hernia.  Patient was in normal state of health for the last 3 to 4 days until yesterday she ate breakfast around 930 and the pain started again around noon.  Right upper quadrant pain with vomiting.  Denies any nausea.  No fevers or chills.  Was having dark urine yesterday this is resolved.  Denies any yellowing of eyes or skin.  Clay colored stool.  Did not have a bowel movement yesterday but had bowel movements before that brown and soft.  Denies hematochezia or or melena. Patient does not have any personal or family history of pancreatitis.  No new medications.  Triglycerides 108 11/2021. Patient just retired a week ago. Patient denies any weight loss or weight gain. Patient denies NSAIDs, alcohol, drug use, smoking. Patient states she does walk and exercise without any shortness of breath or chest pain.  Denies family history of gallbladder issues. Denies family history of colon cancer or stomach cancer. Patient  is never had screening colonoscopy.  Labs in the ER significant for white blood cell count 11, creatinine 1, AST 285, ALT 185, alk phos 123, total bili 1.8, lipase 1857 UA unremarkable, troponin negative x 2. RUQ US showed  mildly echogenic focus gallbladder probably calcified stone or small polyp, CBD 5.7 mm in diameter    Abnormal ED labs: Abnormal Labs Reviewed  COMPREHENSIVE METABOLIC PANEL - Abnormal; Notable for the following components:      Result Value   Glucose, Bld 104 (*)    Creatinine, Ser 1.02 (*)    AST 285 (*)    ALT 185 (*)    Total Bilirubin 1.8 (*)    GFR, Estimated 58 (*)    All other components within normal limits  LIPASE, BLOOD - Abnormal; Notable for the following components:   Lipase 1,857 (*)    All other components within normal limits  CBC WITH DIFFERENTIAL/PLATELET - Abnormal; Notable for the following components:   WBC 11.0 (*)    Neutro Abs 9.1 (*)    All other components within normal limits  URINALYSIS, ROUTINE W REFLEX MICROSCOPIC - Abnormal; Notable for the following components:   APPearance HAZY (*)    Leukocytes,Ua TRACE (*)    All other components within normal limits     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    Surgical History:  She  has a past surgical history that includes LEFT HEART CATH AND CORONARY ANGIOGRAPHY (N/A, 07/18/2020); Coronary/Graft Acute MI Revascularization (N/A, 07/18/2020); Coronary artery bypass graft (N/A, 07/18/2020); TEE without cardioversion (N/A, 07/18/2020); Cardiac catheterization (08/08/2020); LEFT HEART CATH AND CORS/GRAFTS ANGIOGRAPHY (N/A, 08/08/2020); LEFT HEART CATH AND CORS/GRAFTS ANGIOGRAPHY (N/A, 10/03/2020); CORONARY STENT INTERVENTION (N/A, 10/03/2020); and INTRAVASCULAR PRESSURE WIRE/FFR STUDY (N/A, 10/03/2020). Family History:  Her family history is not on file. Social History:   reports that she has never smoked. She has never used smokeless tobacco. She reports that she does not drink alcohol and does not use drugs.  Prior to Admission medications   Medication Sig  Start Date End Date Taking? Authorizing Provider  acetaminophen (TYLENOL) 325 MG tablet Take 325-650 mg by mouth every 6 (six) hours as needed for mild pain (or headaches).    [provider]  alum & mag hydroxide-simeth (MAALOX MAX) 400-400-40 MG/5ML suspension Take 15 mLs by mouth every 6 (six) hours as needed for indigestion. 08/06/22   Rexford Maus, DO  aspirin 81 MG EC tablet Take 1 tablet (81 mg total) by mouth daily. Swallow whole. 12/04/21   Meriam Sprague, MD  atorvastatin (LIPITOR) 80 MG tablet Take 1 tablet (80 mg total) by mouth daily. 02/08/22   Meriam Sprague, MD  Cholecalciferol (VITAMIN D) 50 MCG (2000 UT) CAPS Take 1 capsule by mouth daily.    [provider]  clopidogrel (PLAVIX) 75 MG tablet Take 1 tablet (75 mg total) by mouth daily. 03/01/22   Meriam Sprague, MD  colchicine 0.6 MG tablet Take 1 tablet (0.6 mg total) by mouth as needed (As needed for gout flare). 04/05/21   Alver Sorrow, NP  ezetimibe (ZETIA) 10 MG tablet Take 1 tablet (10 mg total) by mouth daily. 02/08/22   Meriam Sprague, MD  famotidine (PEPCID) 20 MG tablet Take 1 tablet (20 mg total) by mouth daily. 08/06/22  Leanord Asal K, DO  ferrous [id number] C-folic acid (FEROCON) capsule Take 1 capsule by mouth 2 (two) times daily. 11/27/21   Freada Bergeron, MD  metoCLOPramide (REGLAN) 10 MG tablet Take 1 tablet (10 mg total) by mouth every 6 (six) hours as needed for nausea (nausea/headache). 05/07/21   Margarita Mail, PA-C  metoprolol succinate (TOPROL-XL) 25 MG 24 hr tablet Take 0.5 tablets (12.5 mg total) by mouth daily. 08/07/22   Freada Bergeron, MD  nitroGLYCERIN (NITROSTAT) 0.4 MG SL tablet PLACE 1 TABLET (0.4 MG TOTAL) UNDER THE TONGUE EVERY 5 (FIVE) MINUTES X 3 DOSES AS NEEDED FOR CHEST PAIN. 02/05/22   Freada Bergeron, MD  ondansetron (ZOFRAN) 4 MG tablet Take 1 tablet (4 mg total) by mouth every 6 (six) hours. 08/06/22   Leanord Asal  K, DO  oxyCODONE (ROXICODONE) 5 MG immediate release tablet Take 0.5-1 tablets (2.5-5 mg total) by mouth every 6 (six) hours as needed for severe pain. 05/07/21   Harris, Abigail, PA-C  TART CHERRY PO Take 1 capsule by mouth daily.    [provider]    Current Facility-Administered Medications  Medication Dose Route Frequency Provider Last Rate Last Admin   lactated ringers infusion   Intravenous Continuous Caren Griffins, MD 200 mL/hr at 08/10/22 0752 Rate Change at 08/10/22 0752   morphine (PF) 2 MG/ML injection 1 mg  1 mg Intravenous Q4H PRN Shela Leff, MD       naloxone [facility]) injection 0.4 mg  0.4 mg Intravenous PRN Shela Leff, MD       ondansetron [facility]) injection 4 mg  4 mg Intravenous Q6H PRN Shela Leff, MD       Current Outpatient Medications  Medication Sig Dispense Refill   acetaminophen (TYLENOL) 325 MG tablet Take 325-650 mg by mouth every 6 (six) hours as needed for mild pain (or headaches).     alum & mag hydroxide-simeth (MAALOX MAX) 400-400-40 MG/5ML suspension Take 15 mLs by mouth every 6 (six) hours as needed for indigestion. 355 mL 0   aspirin 81 MG EC tablet Take 1 tablet (81 mg total) by mouth daily. Swallow whole. 30 tablet 5   atorvastatin (LIPITOR) 80 MG tablet Take 1 tablet (80 mg total) by mouth daily. 90 tablet 1   Cholecalciferol (VITAMIN D) 50 MCG (2000 UT) CAPS Take 1 capsule by mouth daily.     clopidogrel (PLAVIX) 75 MG tablet Take 1 tablet (75 mg total) by mouth daily. 90 tablet 3   colchicine 0.6 MG tablet Take 1 tablet (0.6 mg total) by mouth as needed (As needed for gout flare). 10 tablet 1   ezetimibe (ZETIA) 10 MG tablet Take 1 tablet (10 mg total) by mouth daily. 90 tablet 1   famotidine (PEPCID) 20 MG tablet Take 1 tablet (20 mg total) by mouth daily. 30 tablet 0   ferrous [id number] C-folic acid (FEROCON) capsule Take 1 capsule by mouth 2 (two) times daily. 180 capsule 3   metoCLOPramide (REGLAN) 10 MG  tablet Take 1 tablet (10 mg total) by mouth every 6 (six) hours as needed for nausea (nausea/headache). 6 tablet 0   metoprolol succinate (TOPROL-XL) 25 MG 24 hr tablet Take 0.5 tablets (12.5 mg total) by mouth daily. 45 tablet 1   nitroGLYCERIN (NITROSTAT) 0.4 MG SL tablet PLACE 1 TABLET (0.4 MG TOTAL) UNDER THE TONGUE EVERY 5 (FIVE) MINUTES X 3 DOSES AS NEEDED FOR CHEST PAIN. 25 tablet 5   ondansetron (ZOFRAN) 4 MG tablet Take  1 tablet (4 mg total) by mouth every 6 (six) hours. 12 tablet 0   oxyCODONE (ROXICODONE) 5 MG immediate release tablet Take 0.5-1 tablets (2.5-5 mg total) by mouth every 6 (six) hours as needed for severe pain. 10 tablet 0   TART CHERRY PO Take 1 capsule by mouth daily.      Allergies as of 08/09/2022   (No Known Allergies)    Review of Systems:    Constitutional: No weight loss, fever, chills, weakness or fatigue HEENT: Eyes: No change in vision               Ears, Nose, Throat:  No change in hearing or congestion Skin: No rash or itching Cardiovascular: No chest pain, chest pressure or palpitations   Respiratory: No SOB or cough Gastrointestinal: See HPI and otherwise negative Genitourinary: No dysuria or change in urinary frequency Neurological: No headache, dizziness or syncope Musculoskeletal: No new muscle or joint pain Hematologic: No bleeding or bruising Psychiatric: No history of depression or anxiety     Physical Exam:  Vital signs in last 24 hours: Temp:  [97.9 F (36.6 C)-98.5 F (36.9 C)] 98.1 F (36.7 C) (01/05 0428) Pulse Rate:  [61-83] 61 (01/05 0515) Resp:  [16-18] 18 (01/05 0424) BP: (114-135)/(61-103) 114/68 (01/05 0515) SpO2:  [95 %-100 %] 97 % (01/05 0515) Weight:  [67.1 kg] 67.1 kg (01/04 1731)   Last BM recorded by nurses in past 5 days No data recorded  General:   Pleasant, well developed female in no acute distress Head:  Normocephalic and atraumatic. Eyes: sclerae anicteric,conjunctive pink  Heart:  regular rate and  rhythm Pulm: Clear anteriorly; no wheezing Abdomen:  Soft, Obese AB, Active bowel sounds. Very mild tenderness in the epigastrium and in the RUQ. Without guarding and Without rebound, No organomegaly appreciated. Extremities:  Without edema. Msk:  Symmetrical without gross deformities. Peripheral pulses intact.  Neurologic:  Alert and  oriented x4;  No focal deficits.  Skin:   Dry and intact without significant lesions or rashes. Psychiatric:  Cooperative. Normal mood and affect.  LAB RESULTS: Recent Labs    08/09/22 1745  WBC 11.0*  HGB 14.9  HCT 45.6  PLT 225   BMET Recent Labs    08/09/22 1745  NA 139  K 4.3  CL 105  CO2 24  GLUCOSE 104*  BUN 18  CREATININE 1.02*  CALCIUM 9.0   LFT Recent Labs    08/09/22 1745  PROT 7.2  ALBUMIN 4.0  AST 285*  ALT 185*  ALKPHOS 123  BILITOT 1.8*   PT/INR No results for input(s): "LABPROT", "INR" in the last 72 hours.  STUDIES: US Abdomen Limited RUQ (LIVER/GB)  Result Date: 08/09/2022 CLINICAL DATA:  Right upper quadrant pain EXAM: ULTRASOUND ABDOMEN LIMITED RIGHT UPPER QUADRANT COMPARISON:  CT from 08/06/2022 FINDINGS: Gallbladder: Gallbladder is well distended. Echogenic focus is noted without mobility which may represent a small adherent stone without significant calcification. Small polyp would deserve consideration as well. No other filling defects are noted. Common bile duct: Diameter: 5.7 mm Liver: No focal lesion identified. Within normal limits in parenchymal echogenicity. Portal vein is patent on color Doppler imaging with normal direction of blood flow towards the liver. Other: Incidental note is made of 3.7 cm simple appearing right renal cyst. IMPRESSION: Mildly echogenic focus in calcification in the gallbladder. This may represent a poorly calcified stone or small polyp. Simple renal cyst.  No follow-up is recommended. Electronically Signed   By: Loraine Leriche  Lukens M.D.   On: 08/09/2022 23:38     Doree Albee   08/10/2022, 8:04 AM

## 2022-08-10 NOTE — ED Provider Notes (Signed)
[facility] EMERGENCY DEPARTMENT Provider Note   CSN: [id number] Arrival date & time: 08/09/22  1711     History  Chief Complaint  Patient presents with   Abdominal Pain    Brenda Wilkins is a 77 y.o. female.  77 year old female with history of coronary artery disease who presents ER today for the third time in 4 days secondary to epigastric pain.  She visited twice on 12/31 for similar symptoms.  Both of those times she started having severe epigastric pain that wraps around her abdomen associated with nonbloody nonbilious emesis after eating.  Both times it resolved within a couple hours.  During that visit she had a cardiac workup and a CT scan both of which were reassuring.  Her symptoms improved and had a hiatal hernia so there is concern for possible indigestion and she went home on Maalox.  She had been doing fine on a light diet until today where she had the exact symptoms come back last about 4 hours.  They resolved here.  No known gallbladder issues.  Not an alcohol drinker.  No skin color changes.   Abdominal Pain      Home Medications Prior to Admission medications   Medication Sig Start Date End Date Taking? Authorizing Provider  acetaminophen (TYLENOL) 325 MG tablet Take 325-650 mg by mouth every 6 (six) hours as needed for mild pain (or headaches).    [provider]  alum & mag hydroxide-simeth (MAALOX MAX) 400-400-40 MG/5ML suspension Take 15 mLs by mouth every 6 (six) hours as needed for indigestion. 08/06/22   Rexford Maus, DO  aspirin 81 MG EC tablet Take 1 tablet (81 mg total) by mouth daily. Swallow whole. 12/04/21   Meriam Sprague, MD  atorvastatin (LIPITOR) 80 MG tablet Take 1 tablet (80 mg total) by mouth daily. 02/08/22   Meriam Sprague, MD  Cholecalciferol (VITAMIN D) 50 MCG (2000 UT) CAPS Take 1 capsule by mouth daily.    [provider]  clopidogrel (PLAVIX) 75 MG tablet Take 1 tablet (75 mg total) by mouth  daily. 03/01/22   Meriam Sprague, MD  colchicine 0.6 MG tablet Take 1 tablet (0.6 mg total) by mouth as needed (As needed for gout flare). 04/05/21   Alver Sorrow, NP  ezetimibe (ZETIA) 10 MG tablet Take 1 tablet (10 mg total) by mouth daily. 02/08/22   Meriam Sprague, MD  famotidine (PEPCID) 20 MG tablet Take 1 tablet (20 mg total) by mouth daily. 08/06/22   Rexford Maus, DO  ferrous fumarate-b12-vitamic C-folic acid (FEROCON) capsule Take 1 capsule by mouth 2 (two) times daily. 11/27/21   Meriam Sprague, MD  metoCLOPramide (REGLAN) 10 MG tablet Take 1 tablet (10 mg total) by mouth every 6 (six) hours as needed for nausea (nausea/headache). 05/07/21   Arthor Captain, PA-C  metoprolol succinate (TOPROL-XL) 25 MG 24 hr tablet Take 0.5 tablets (12.5 mg total) by mouth daily. 08/07/22   Meriam Sprague, MD  nitroGLYCERIN (NITROSTAT) 0.4 MG SL tablet PLACE 1 TABLET (0.4 MG TOTAL) UNDER THE TONGUE EVERY 5 (FIVE) MINUTES X 3 DOSES AS NEEDED FOR CHEST PAIN. 02/05/22   Meriam Sprague, MD  ondansetron (ZOFRAN) 4 MG tablet Take 1 tablet (4 mg total) by mouth every 6 (six) hours. 08/06/22   Elayne Snare K, DO  oxyCODONE (ROXICODONE) 5 MG immediate release tablet Take 0.5-1 tablets (2.5-5 mg total) by mouth every 6 (six) hours as needed for severe pain.  05/07/21   Harris, Abigail, PA-C  TART CHERRY PO Take 1 capsule by mouth daily.    [provider]      Allergies    Patient has no known allergies.    Review of Systems   Review of Systems  Gastrointestinal:  Positive for abdominal pain.    Physical Exam Updated Vital Signs BP 135/66   Pulse 69   Temp 98.3 F (36.8 C) (Oral)   Resp 18   Ht 4' 11.5" (1.511 m)   Wt 67.1 kg   SpO2 98%   BMI 29.38 kg/m  Physical Exam Vitals and nursing note reviewed.  Constitutional:      Appearance: She is well-developed.  HENT:     Head: Normocephalic and atraumatic.  Cardiovascular:     Rate and Rhythm: Normal  rate and regular rhythm.  Pulmonary:     Effort: No respiratory distress.     Breath sounds: No stridor.  Abdominal:     General: There is no distension.     Palpations: Abdomen is soft.  Musculoskeletal:     Cervical back: Normal range of motion.  Skin:    General: Skin is warm and dry.  Neurological:     Mental Status: She is alert.     ED Results / Procedures / Treatments   Labs (all labs ordered are listed, but only abnormal results are displayed) Labs Reviewed  COMPREHENSIVE METABOLIC PANEL - Abnormal; Notable for the following components:      Result Value   Glucose, Bld 104 (*)    Creatinine, Ser 1.02 (*)    AST 285 (*)    ALT 185 (*)    Total Bilirubin 1.8 (*)    GFR, Estimated 58 (*)    All other components within normal limits  LIPASE, BLOOD - Abnormal; Notable for the following components:   Lipase 1,857 (*)    All other components within normal limits  CBC WITH DIFFERENTIAL/PLATELET - Abnormal; Notable for the following components:   WBC 11.0 (*)    Neutro Abs 9.1 (*)    All other components within normal limits  URINALYSIS, ROUTINE W REFLEX MICROSCOPIC - Abnormal; Notable for the following components:   APPearance HAZY (*)    Leukocytes,Ua TRACE (*)    All other components within normal limits  TROPONIN I (HIGH SENSITIVITY)  TROPONIN I (HIGH SENSITIVITY)    EKG EKG Interpretation  Date/Time:  Thursday August 09 2022 17:32:06 EST Ventricular Rate:  65 PR Interval:  152 QRS Duration: 94 QT Interval:  414 QTC Calculation: 430 R Axis:   -10 Text Interpretation: Normal sinus rhythm Incomplete right bundle branch block Possible Anteroseptal infarct , age undetermined Abnormal ECG When compared with ECG of 06-Aug-2022 08:15, PREVIOUS ECG IS PRESENT Confirmed by Merrily Pew [phone]) on 08/09/2022 11:02:40 PM  Radiology US Abdomen Limited RUQ (LIVER/GB)  Result Date: 08/09/2022 CLINICAL DATA:  Right upper quadrant pain EXAM: ULTRASOUND ABDOMEN LIMITED  RIGHT UPPER QUADRANT COMPARISON:  CT from 08/06/2022 FINDINGS: Gallbladder: Gallbladder is well distended. Echogenic focus is noted without mobility which may represent a small adherent stone without significant calcification. Small polyp would deserve consideration as well. No other filling defects are noted. Common bile duct: Diameter: 5.7 mm Liver: No focal lesion identified. Within normal limits in parenchymal echogenicity. Portal vein is patent on color Doppler imaging with normal direction of blood flow towards the liver. Other: Incidental note is made of 3.7 cm simple appearing right renal cyst. IMPRESSION: Mildly echogenic focus  in calcification in the gallbladder. This may represent a poorly calcified stone or small polyp. Simple renal cyst.  No follow-up is recommended. Electronically Signed   By: Inez Catalina M.D.   On: 08/09/2022 23:38    Procedures Procedures    Medications Ordered in ED Medications  lactated ringers infusion ( Intravenous New Bag/Given 08/09/22 2344)    ED Course/ Medical Decision Making/ A&P                           Medical Decision Making Amount and/or Complexity of Data Reviewed Labs: ordered. Radiology: ordered.  Risk Prescription drug management. Decision regarding hospitalization.   Patient with significant lab abnormalities compared to her previous visits.  She now has a significant increase in her lipase at 1800 but was 70s before.  She also has increase in her AST ALT and bilirubin.  Ultrasound done and no evidence of cholecystitis.  Discussed with surgery that we will follow in the hospital but suggested GI workup.  Discussed with hospitalist they will admit.  Consulted [facility] via excepted institutional protocol with the plan to see him in the morning. Currently NPO.   Final Clinical Impression(s) / ED Diagnoses Final diagnoses:  Abdominal pain, unspecified abdominal location  Acute biliary pancreatitis, unspecified complication status    Rx  / DC Orders ED Discharge Orders     None         Keria Widrig, Corene Cornea, MD 08/10/22 [phone]

## 2022-08-10 NOTE — Progress Notes (Signed)
No charge note  Patient seen and examined this morning, admitted overnight, H&P reviewed and agree with the assessment and plan.  In brief, this is a 77 year old female with history of CAD status post three-vessel CABG couple years ago, comes into the hospital with epigastric abdominal pain that comes and goes over the past week.  She was seen in the ED at the end of December, was found to have a small sliding hiatal hernia and was sent home with symptomatic management.  She had recurrent symptoms with epigastric abdominal pain wrapping around her back, along with vomiting and decided to come back.  She was found to have significant elevation of her lipase, and was admitted to the hospital with general surgery and GI consultation  MRCP is pending this morning, continue supportive care with fluids, n.p.o., symptomatic management.  Appreciate GI and surgery  Brenda Wilkins M. Cruzita Lederer, MD, PhD [facility]  Between 7 am - 7 pm you can contact me via Amion (for emergencies) or [place] (non urgent matters).  I am not available 7 pm - 7 am, please contact night coverage MD/APP via Amion

## 2022-08-10 NOTE — Consult Note (Signed)
Reason for Consult:Biliary pancreatitis Referring Physician: Julious Oka  Brenda Wilkins is an 77 y.o. female.  HPI: 77yo F with PMHx CAD S/P 3 vessel CABG 2 years ago C/O epigastric abdominal pain over the past week.  She was seen in the ED on 12/31 and was found to have a small sliding hiatal hernia but no other significant abnormality and she was discharged home.  The pain continued and got worse over the past 24 hours and she came back to the emergency room.  She had associated vomiting which was pretty extensive and led to soreness in her rib area.  Workup in the emergency department revealed pancreatitis and elevated LFTs and I was asked to see her for surgical management.  Her pain has eased off a little bit at this time.  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;  Service: Open Heart Surgery;  Laterality: N/A;   CORONARY STENT INTERVENTION N/A 10/03/2020   Procedure: CORONARY STENT INTERVENTION;  Surgeon: Lorretta Harp, MD;  Location: [facility];  Service: Cardiovascular;  Laterality: N/A;   CORONARY/GRAFT ACUTE MI REVASCULARIZATION N/A 07/18/2020   Procedure: Coronary/Graft Acute MI Revascularization;  Surgeon: Martinique, Peter M, MD;  Location: [facility];  Service: Cardiovascular;  Laterality: N/A;   INTRAVASCULAR PRESSURE WIRE/FFR STUDY N/A 10/03/2020   Procedure: INTRAVASCULAR PRESSURE WIRE/FFR STUDY;  Surgeon: Lorretta Harp, MD;  Location: [facility];  Service: Cardiovascular;   Laterality: N/A;   LEFT HEART CATH AND CORONARY ANGIOGRAPHY N/A 07/18/2020   Procedure: LEFT HEART CATH AND CORONARY ANGIOGRAPHY;  Surgeon: Sherren Mocha, MD;  Location: [facility];  Service: Cardiovascular;  Laterality: N/A;   LEFT HEART CATH AND CORS/GRAFTS ANGIOGRAPHY N/A 08/08/2020   Procedure: LEFT HEART CATH AND CORS/GRAFTS ANGIOGRAPHY;  Surgeon: Troy Sine, MD;  Location: [facility];  Service: Cardiovascular;  Laterality: N/A;   LEFT HEART CATH AND CORS/GRAFTS ANGIOGRAPHY N/A 10/03/2020   Procedure: LEFT HEART CATH AND CORS/GRAFTS ANGIOGRAPHY;  Surgeon: Lorretta Harp, MD;  Location: [facility];  Service: Cardiovascular;  Laterality: N/A;   TEE WITHOUT CARDIOVERSION N/A 07/18/2020   Procedure: TRANSESOPHAGEAL ECHOCARDIOGRAM (TEE);  Surgeon: Gaye Pollack, MD;  Location: [facility];  Service: Open Heart Surgery;  Laterality: N/A;    History reviewed. No pertinent family history.  Social History:  reports that she has never smoked. She has never used smokeless tobacco. She reports that she does not drink alcohol and does not use drugs.  Allergies: No Known Allergies  Medications: I have reviewed the patient's current medications.  Results for orders placed or performed during the hospital encounter of 08/09/22 (from the past 48 hour(s))  Urinalysis, Routine w reflex microscopic     Status: Abnormal   Collection Time: 08/09/22  5:11 PM  Result Value Ref Range   Color, Urine YELLOW YELLOW   APPearance HAZY (A) CLEAR   Specific Gravity, Urine 1.017 1.005 - 1.030   pH 6.0 5.0 - 8.0   Glucose,  UA NEGATIVE NEGATIVE mg/dL   Hgb urine dipstick NEGATIVE NEGATIVE   Bilirubin Urine NEGATIVE NEGATIVE   Ketones, ur NEGATIVE NEGATIVE mg/dL   Protein, ur NEGATIVE NEGATIVE mg/dL   Nitrite NEGATIVE NEGATIVE   Leukocytes,Ua TRACE (A) NEGATIVE   RBC / HPF 0-5 0 - 5 RBC/hpf   WBC, UA 0-5 0 - 5 WBC/hpf   Bacteria, UA NONE SEEN NONE SEEN   Squamous Epithelial / HPF  6-10 0 - 5 /HPF   Mucus PRESENT     Comment: Performed at [facility], [address], [place], [place] [address]  Comprehensive metabolic panel     Status: Abnormal   Collection Time: 08/09/22  5:45 PM  Result Value Ref Range   Sodium 139 135 - 145 mmol/L   Potassium 4.3 3.5 - 5.1 mmol/L   Chloride 105 98 - 111 mmol/L   CO2 24 22 - 32 mmol/L   Glucose, Bld 104 (H) 70 - 99 mg/dL    Comment: Glucose reference range applies only to samples taken after fasting for at least 8 hours.   BUN 18 8 - 23 mg/dL   Creatinine, Ser 1.32 (H) 0.44 - 1.00 mg/dL   Calcium 9.0 8.9 - 44.0 mg/dL   Total Protein 7.2 6.5 - 8.1 g/dL   Albumin 4.0 3.5 - 5.0 g/dL   AST 102 (H) 15 - 41 U/L   ALT 185 (H) 0 - 44 U/L   Alkaline Phosphatase 123 38 - 126 U/L   Total Bilirubin 1.8 (H) 0.3 - 1.2 mg/dL   GFR, Estimated 58 (L) >60 mL/min    Comment: (NOTE) Calculated using the CKD-EPI Creatinine Equation (2021)    Anion gap 10 5 - 15    Comment: Performed at [facility], [address], [place], [place] [address]  Lipase, blood     Status: Abnormal   Collection Time: 08/09/22  5:45 PM  Result Value Ref Range   Lipase 1,857 (H) 11 - 51 U/L    Comment: RESULT CONFIRMED BY MANUAL DILUTION Performed at [facility], [address], [place], [place] [address]   CBC with Diff     Status: Abnormal   Collection Time: 08/09/22  5:45 PM  Result Value Ref Range   WBC 11.0 (H) 4.0 - 10.5 K/uL   RBC 4.68 3.87 - 5.11 MIL/uL   Hemoglobin 14.9 12.0 - 15.0 g/dL   HCT 47.4 25.9 - 56.3 %   MCV 97.4 80.0 - 100.0 fL   MCH 31.8 26.0 - 34.0 pg   MCHC 32.7 30.0 - 36.0 g/dL   RDW 87.5 64.3 - 32.9 %   Platelets 225 150 - 400 K/uL   nRBC 0.0 0.0 - 0.2 %   Neutrophils Relative % 82 %   Neutro Abs 9.1 (H) 1.7 - 7.7 K/uL   Lymphocytes Relative 10 %   Lymphs Abs 1.1 0.7 - 4.0 K/uL   Monocytes Relative 7 %   Monocytes Absolute 0.8 0.1 - 1.0 K/uL   Eosinophils Relative 1 %   Eosinophils Absolute 0.1 0.0 - 0.5  K/uL   Basophils Relative 0 %   Basophils Absolute 0.0 0.0 - 0.1 K/uL   Immature Granulocytes 0 %   Abs Immature Granulocytes 0.04 0.00 - 0.07 K/uL    Comment: Performed at [facility], [address] [place], [place] [address]  Troponin I (High Sensitivity)     Status: None   Collection Time: 08/09/22  5:45  PM  Result Value Ref Range   Troponin I (High Sensitivity) 6 <18 ng/L    Comment: (NOTE) Elevated high sensitivity troponin I (hsTnI) values and significant  changes across serial measurements may suggest ACS but many other  chronic and acute conditions are known to elevate hsTnI results.  Refer to the "Links" section for chest pain algorithms and additional  guidance. Performed at [facility], [address], [place], [place] [address]   Troponin I (High Sensitivity)     Status: None   Collection Time: 08/09/22 11:00 PM  Result Value Ref Range   Troponin I (High Sensitivity) 7 <18 ng/L    Comment: (NOTE) Elevated high sensitivity troponin I (hsTnI) values and significant  changes across serial measurements may suggest ACS but many other  chronic and acute conditions are known to elevate hsTnI results.  Refer to the "Links" section for chest pain algorithms and additional  guidance. Performed at [facility], [address], [place], [place] [address]     US Abdomen Limited RUQ (LIVER/GB)  Result Date: 08/09/2022 CLINICAL DATA:  Right upper quadrant pain EXAM: ULTRASOUND ABDOMEN LIMITED RIGHT UPPER QUADRANT COMPARISON:  CT from 08/06/2022 FINDINGS: Gallbladder: Gallbladder is well distended. Echogenic focus is noted without mobility which may represent a small adherent stone without significant calcification. Small polyp would deserve consideration as well. No other filling defects are noted. Common bile duct: Diameter: 5.7 mm Liver: No focal lesion identified. Within normal limits in parenchymal echogenicity. Portal vein is patent on color Doppler imaging  with normal direction of blood flow towards the liver. Other: Incidental note is made of 3.7 cm simple appearing right renal cyst. IMPRESSION: Mildly echogenic focus in calcification in the gallbladder. This may represent a poorly calcified stone or small polyp. Simple renal cyst.  No follow-up is recommended. Electronically Signed   By: Alcide Clever M.D.   On: 08/09/2022 23:38    Review of Systems  Constitutional:  Positive for appetite change.  HENT: Negative.    Eyes: Negative.   Respiratory: Negative.    Cardiovascular:  Negative for chest pain.  Gastrointestinal:  Positive for abdominal pain, nausea and vomiting.  Endocrine: Negative.   Genitourinary: Negative.   Musculoskeletal: Negative.   Skin: Negative.   Allergic/Immunologic: Negative.   Neurological: Negative.   Hematological: Negative.   Psychiatric/Behavioral: Negative.     Blood pressure 114/68, pulse 61, temperature 98.1 F (36.7 C), temperature source Oral, resp. rate 18, height 4' 11.5" (1.511 m), weight 67.1 kg, SpO2 97 %. Physical Exam HENT:     Head: Normocephalic.  Cardiovascular:     Rate and Rhythm: Normal rate and regular rhythm.     Heart sounds: Normal heart sounds.  Pulmonary:     Effort: Pulmonary effort is normal.     Breath sounds: Normal breath sounds.  Abdominal:     General: Abdomen is flat. Bowel sounds are decreased.     Palpations: Abdomen is soft.     Tenderness: There is abdominal tenderness in the epigastric area. There is no guarding or rebound.     Hernia: No hernia is present.  Skin:    General: Skin is warm and dry.     Capillary Refill: Capillary refill takes 2 to 3 seconds.  Neurological:     Mental Status: She is alert and oriented to person, place, and time.  Psychiatric:        Mood and Affect: Mood normal.     Assessment/Plan: Biliary  pancreatitis -gallbladder either contains a stone or a polyp.  Agree with medical admission and bowel rest.  MRCP is planned.  We will follow  and likely plan cholecystectomy this admission once her pancreatitis improves.  She is disappointed to be in the hospital as she is supposed to have a retirement party tomorrow night.  Zenovia Jarred 08/10/2022, 6:46 AM

## 2022-08-11 ENCOUNTER — Encounter (HOSPITAL_COMMUNITY): Payer: Self-pay | Admitting: Internal Medicine

## 2022-08-11 ENCOUNTER — Inpatient Hospital Stay (HOSPITAL_COMMUNITY): Payer: Medicare HMO | Admitting: Anesthesiology

## 2022-08-11 ENCOUNTER — Other Ambulatory Visit: Payer: Self-pay

## 2022-08-11 ENCOUNTER — Encounter (HOSPITAL_COMMUNITY)
Admission: EM | Disposition: A | Discharge status: Home / Self Care | Payer: Self-pay | Source: Home / Self Care | Attending: Internal Medicine

## 2022-08-11 DIAGNOSIS — I25119 Atherosclerotic heart disease of native coronary artery with unspecified angina pectoris: Secondary | ICD-10-CM | POA: Diagnosis not present

## 2022-08-11 DIAGNOSIS — K819 Cholecystitis, unspecified: Secondary | ICD-10-CM

## 2022-08-11 DIAGNOSIS — I10 Essential (primary) hypertension: Secondary | ICD-10-CM

## 2022-08-11 DIAGNOSIS — K851 Biliary acute pancreatitis without necrosis or infection: Secondary | ICD-10-CM | POA: Diagnosis not present

## 2022-08-11 DIAGNOSIS — I252 Old myocardial infarction: Secondary | ICD-10-CM | POA: Diagnosis not present

## 2022-08-11 HISTORY — PX: UMBILICAL HERNIA REPAIR: SHX196

## 2022-08-11 HISTORY — PX: CHOLECYSTECTOMY: SHX55

## 2022-08-11 SURGERY — LAPAROSCOPIC CHOLECYSTECTOMY
Anesthesia: General | Site: Abdomen

## 2022-08-11 MED ORDER — LIDOCAINE 2% (20 MG/ML) 5 ML SYRINGE
INTRAMUSCULAR | Status: AC
  Filled 2022-08-11: qty 5

## 2022-08-11 MED ORDER — METOPROLOL SUCCINATE ER 25 MG PO TB24
25.0000 mg | ORAL_TABLET | Freq: Once | ORAL | Status: AC
  Administered 2022-08-11: 25 mg via ORAL

## 2022-08-11 MED ORDER — METOPROLOL SUCCINATE ER 25 MG PO TB24
12.5000 mg | ORAL_TABLET | Freq: Every day | ORAL | Status: DC
  Administered 2022-08-12: 12.5 mg via ORAL
  Filled 2022-08-11: qty 1

## 2022-08-11 MED ORDER — FENTANYL CITRATE (PF) 250 MCG/5ML IJ SOLN
INTRAMUSCULAR | Status: AC
  Filled 2022-08-11: qty 5

## 2022-08-11 MED ORDER — OXYCODONE HCL 5 MG PO TABS
5.0000 mg | ORAL_TABLET | ORAL | Status: DC | PRN
  Administered 2022-08-11: 5 mg via ORAL
  Filled 2022-08-11: qty 1

## 2022-08-11 MED ORDER — ORAL CARE MOUTH RINSE: 15.0000 mL | Freq: Once | OROMUCOSAL | Status: AC

## 2022-08-11 MED ORDER — PROCHLORPERAZINE EDISYLATE 10 MG/2ML IJ SOLN: 10.0000 mg | INTRAMUSCULAR | Status: DC | PRN

## 2022-08-11 MED ORDER — LACTATED RINGERS IV SOLN: INTRAVENOUS | Status: DC | PRN

## 2022-08-11 MED ORDER — SIMETHICONE 80 MG PO CHEW: 80.0000 mg | CHEWABLE_TABLET | Freq: Four times a day (QID) | ORAL | Status: DC | PRN

## 2022-08-11 MED ORDER — PROPOFOL 10 MG/ML IV BOLUS
INTRAVENOUS | Status: AC
  Filled 2022-08-11: qty 20

## 2022-08-11 MED ORDER — ONDANSETRON HCL 4 MG/2ML IJ SOLN
INTRAMUSCULAR | Status: DC | PRN
  Administered 2022-08-11: 4 mg via INTRAVENOUS

## 2022-08-11 MED ORDER — ACETAMINOPHEN 325 MG PO TABS
650.0000 mg | ORAL_TABLET | Freq: Four times a day (QID) | ORAL | Status: DC
  Administered 2022-08-11 – 2022-08-12 (×5): 650 mg via ORAL
  Filled 2022-08-11 (×5): qty 2

## 2022-08-11 MED ORDER — 0.9 % SODIUM CHLORIDE (POUR BTL) OPTIME
TOPICAL | Status: DC | PRN
  Administered 2022-08-11: 1000 mL

## 2022-08-11 MED ORDER — LIDOCAINE 2% (20 MG/ML) 5 ML SYRINGE
INTRAMUSCULAR | Status: DC | PRN
  Administered 2022-08-11: 60 mg via INTRAVENOUS

## 2022-08-11 MED ORDER — LACTATED RINGERS IV SOLN: INTRAVENOUS | Status: DC

## 2022-08-11 MED ORDER — ROCURONIUM BROMIDE 10 MG/ML (PF) SYRINGE
PREFILLED_SYRINGE | INTRAVENOUS | Status: DC | PRN
  Administered 2022-08-11: 60 mg via INTRAVENOUS

## 2022-08-11 MED ORDER — PHENYLEPHRINE 80 MCG/ML (10ML) SYRINGE FOR IV PUSH (FOR BLOOD PRESSURE SUPPORT)
PREFILLED_SYRINGE | INTRAVENOUS | Status: AC
  Filled 2022-08-11: qty 10

## 2022-08-11 MED ORDER — ONDANSETRON HCL 4 MG/2ML IJ SOLN
INTRAMUSCULAR | Status: AC
  Filled 2022-08-11: qty 2

## 2022-08-11 MED ORDER — SODIUM CHLORIDE 0.9 % IR SOLN
Status: DC | PRN
  Administered 2022-08-11: 200 mL

## 2022-08-11 MED ORDER — BUPIVACAINE HCL (PF) 0.25 % IJ SOLN
INTRAMUSCULAR | Status: AC
  Filled 2022-08-11: qty 30

## 2022-08-11 MED ORDER — PHENYLEPHRINE 80 MCG/ML (10ML) SYRINGE FOR IV PUSH (FOR BLOOD PRESSURE SUPPORT)
PREFILLED_SYRINGE | INTRAVENOUS | Status: DC | PRN
  Administered 2022-08-11: 40 ug via INTRAVENOUS

## 2022-08-11 MED ORDER — PHENYLEPHRINE HCL (PRESSORS) 10 MG/ML IV SOLN
INTRAVENOUS | Status: DC | PRN
  Administered 2022-08-11: 80 ug via INTRAVENOUS

## 2022-08-11 MED ORDER — SUGAMMADEX SODIUM 200 MG/2ML IV SOLN
INTRAVENOUS | Status: DC | PRN
  Administered 2022-08-11: 200 mg via INTRAVENOUS

## 2022-08-11 MED ORDER — CEFAZOLIN SODIUM 1 G IJ SOLR
INTRAMUSCULAR | Status: AC
  Filled 2022-08-11: qty 20

## 2022-08-11 MED ORDER — DEXAMETHASONE SODIUM PHOSPHATE 10 MG/ML IJ SOLN
INTRAMUSCULAR | Status: AC
  Filled 2022-08-11: qty 1

## 2022-08-11 MED ORDER — ATORVASTATIN CALCIUM 80 MG PO TABS
80.0000 mg | ORAL_TABLET | Freq: Every morning | ORAL | Status: DC
  Administered 2022-08-12: 80 mg via ORAL
  Filled 2022-08-11: qty 1

## 2022-08-11 MED ORDER — CEFAZOLIN SODIUM-DEXTROSE 2-3 GM-%(50ML) IV SOLR
INTRAVENOUS | Status: DC | PRN
  Administered 2022-08-11: 2 g via INTRAVENOUS

## 2022-08-11 MED ORDER — OXYCODONE HCL 5 MG PO TABS
10.0000 mg | ORAL_TABLET | ORAL | Status: DC | PRN
  Filled 2022-08-11: qty 2

## 2022-08-11 MED ORDER — FENTANYL CITRATE (PF) 250 MCG/5ML IJ SOLN
INTRAMUSCULAR | Status: DC | PRN
  Administered 2022-08-11: 100 ug via INTRAVENOUS
  Administered 2022-08-11 (×2): 50 ug via INTRAVENOUS

## 2022-08-11 MED ORDER — METOPROLOL SUCCINATE ER 25 MG PO TB24
ORAL_TABLET | ORAL | Status: AC
  Filled 2022-08-11: qty 1

## 2022-08-11 MED ORDER — PROPOFOL 10 MG/ML IV BOLUS
INTRAVENOUS | Status: DC | PRN
  Administered 2022-08-11 (×2): 10 mg via INTRAVENOUS
  Administered 2022-08-11: 90 mg via INTRAVENOUS

## 2022-08-11 MED ORDER — PROMETHAZINE HCL 25 MG/ML IJ SOLN: 6.2500 mg | INTRAMUSCULAR | Status: DC | PRN

## 2022-08-11 MED ORDER — HYDROMORPHONE HCL 1 MG/ML IJ SOLN: 0.2500 mg | INTRAMUSCULAR | Status: DC | PRN

## 2022-08-11 MED ORDER — ROCURONIUM BROMIDE 10 MG/ML (PF) SYRINGE
PREFILLED_SYRINGE | INTRAVENOUS | Status: AC
  Filled 2022-08-11: qty 10

## 2022-08-11 MED ORDER — CHLORHEXIDINE GLUCONATE 0.12 % MT SOLN: 15.0000 mL | Freq: Once | OROMUCOSAL | Status: AC

## 2022-08-11 MED ORDER — HYDROMORPHONE HCL 1 MG/ML IJ SOLN
0.5000 mg | INTRAMUSCULAR | Status: DC | PRN
  Administered 2022-08-11: 0.5 mg via INTRAVENOUS
  Filled 2022-08-11: qty 0.5

## 2022-08-11 MED ORDER — CHLORHEXIDINE GLUCONATE 0.12 % MT SOLN
OROMUCOSAL | Status: AC
  Administered 2022-08-11: 15 mL via OROMUCOSAL
  Filled 2022-08-11: qty 15

## 2022-08-11 MED ORDER — DOCUSATE SODIUM 100 MG PO CAPS
100.0000 mg | ORAL_CAPSULE | Freq: Two times a day (BID) | ORAL | Status: DC
  Administered 2022-08-11 – 2022-08-12 (×3): 100 mg via ORAL
  Filled 2022-08-11 (×3): qty 1

## 2022-08-11 MED ORDER — DEXAMETHASONE SODIUM PHOSPHATE 10 MG/ML IJ SOLN
INTRAMUSCULAR | Status: DC | PRN
  Administered 2022-08-11: 10 mg via INTRAVENOUS

## 2022-08-11 MED ORDER — BUPIVACAINE HCL 0.25 % IJ SOLN
INTRAMUSCULAR | Status: DC | PRN
  Administered 2022-08-11: 30 mL

## 2022-08-11 SURGICAL SUPPLY — 44 items
APPLIER CLIP 5 13 M/L LIGAMAX5 (MISCELLANEOUS) ×2
BAG COUNTER SPONGE SURGICOUNT (BAG) ×2 IMPLANT
CANISTER SUCT 3000ML PPV (MISCELLANEOUS) ×2 IMPLANT
CHLORAPREP W/TINT 26 (MISCELLANEOUS) ×2 IMPLANT
CLIP APPLIE 5 13 M/L LIGAMAX5 (MISCELLANEOUS) ×2 IMPLANT
COVER SURGICAL LIGHT HANDLE (MISCELLANEOUS) ×2 IMPLANT
DERMABOND ADVANCED .7 DNX12 (GAUZE/BANDAGES/DRESSINGS) ×2 IMPLANT
DRSG TEGADERM 2-3/8X2-3/4 SM (GAUZE/BANDAGES/DRESSINGS) IMPLANT
ELECT REM PT RETURN 9FT ADLT (ELECTROSURGICAL) ×2
ELECTRODE REM PT RTRN 9FT ADLT (ELECTROSURGICAL) ×2 IMPLANT
ENDOLOOP SUT PDS II  0 18 (SUTURE) ×2
ENDOLOOP SUT PDS II 0 18 (SUTURE) IMPLANT
GAUZE SPONGE 2X2 8PLY STRL LF (GAUZE/BANDAGES/DRESSINGS) IMPLANT
GLOVE BIO SURGEON STRL SZ7.5 (GLOVE) ×2 IMPLANT
GLOVE BIOGEL PI IND STRL 8 (GLOVE) ×2 IMPLANT
GOWN STRL REUS W/ TWL LRG LVL3 (GOWN DISPOSABLE) ×4 IMPLANT
GOWN STRL REUS W/ TWL XL LVL3 (GOWN DISPOSABLE) ×2 IMPLANT
GOWN STRL REUS W/TWL LRG LVL3 (GOWN DISPOSABLE) ×4
GOWN STRL REUS W/TWL XL LVL3 (GOWN DISPOSABLE) ×2
GRASPER SUT TROCAR 14GX15 (MISCELLANEOUS) ×2 IMPLANT
KIT BASIN OR (CUSTOM PROCEDURE TRAY) ×2 IMPLANT
KIT TURNOVER KIT B (KITS) ×2 IMPLANT
NDL 22X1.5 STRL (OR ONLY) (MISCELLANEOUS) ×2 IMPLANT
NDL INSUFFLATION 14GA 120MM (NEEDLE) ×2 IMPLANT
NEEDLE 22X1.5 STRL (OR ONLY) (MISCELLANEOUS) ×2 IMPLANT
NEEDLE INSUFFLATION 14GA 120MM (NEEDLE) ×2 IMPLANT
NS IRRIG 1000ML POUR BTL (IV SOLUTION) ×2 IMPLANT
PAD ARMBOARD 7.5X6 YLW CONV (MISCELLANEOUS) ×2 IMPLANT
PENCIL BUTTON HOLSTER BLD 10FT (ELECTRODE) IMPLANT
POUCH RETRIEVAL ECOSAC 10 (ENDOMECHANICALS) ×2 IMPLANT
POUCH RETRIEVAL ECOSAC 10MM (ENDOMECHANICALS) ×2
SCISSORS LAP 5X35 DISP (ENDOMECHANICALS) ×2 IMPLANT
SET IRRIG TUBING LAPAROSCOPIC (IRRIGATION / IRRIGATOR) ×2 IMPLANT
SET TUBE SMOKE EVAC HIGH FLOW (TUBING) ×2 IMPLANT
SLEEVE ENDOPATH XCEL 5M (ENDOMECHANICALS) ×4 IMPLANT
SPECIMEN JAR SMALL (MISCELLANEOUS) ×2 IMPLANT
SUT MNCRL AB 4-0 PS2 18 (SUTURE) ×2 IMPLANT
SUT VICRYL 0 UR6 27IN ABS (SUTURE) IMPLANT
TOWEL GREEN STERILE FF (TOWEL DISPOSABLE) ×2 IMPLANT
TRAY LAPAROSCOPIC MC (CUSTOM PROCEDURE TRAY) ×2 IMPLANT
TROCAR XCEL NON-BLD 11X100MML (ENDOMECHANICALS) ×2 IMPLANT
TROCAR XCEL NON-BLD 5MMX100MML (ENDOMECHANICALS) ×2 IMPLANT
WARMER LAPAROSCOPE (MISCELLANEOUS) ×2 IMPLANT
WATER STERILE IRR 1000ML POUR (IV SOLUTION) ×2 IMPLANT

## 2022-08-11 NOTE — Anesthesia Postprocedure Evaluation (Signed)
Anesthesia Post Note  Patient: Brenda Wilkins  Procedure(s) Performed: LAPAROSCOPIC CHOLECYSTECTOMY HERNIA REPAIR UMBILICAL ADULT (Abdomen)     Patient location during evaluation: PACU Anesthesia Type: General Level of consciousness: sedated and patient cooperative Pain management: pain level controlled Vital Signs Assessment: post-procedure vital signs reviewed and stable Respiratory status: spontaneous breathing Cardiovascular status: stable Anesthetic complications: no   No notable events documented.  Last Vitals:  Vitals:   08/11/22 0915 08/11/22 0930  BP: 133/65 132/68  Pulse: 69 71  Resp: 11 15  Temp:  36.5 C  SpO2: 100% 100%    Last Pain:  Vitals:   08/11/22 0930  TempSrc:   PainSc: 2                  Nolon Nations

## 2022-08-11 NOTE — Progress Notes (Signed)
PROGRESS NOTE  Lisel Siegrist ION:[id number] DOB: 1946/03/23 DOA: 08/09/2022 PCP: Patient, No Pcp Per   LOS: 1 day   Brief Narrative / Interim history: 77 year old female with history of CAD status post three-vessel CABG couple years ago, comes into the hospital with epigastric abdominal pain that comes and goes over the past week. She was seen in the ED at the end of December, was found to have a small sliding hiatal hernia and was sent home with symptomatic management. She had recurrent symptoms with epigastric abdominal pain wrapping around her back, along with vomiting and decided to come back. She was found to have significant elevation of her lipase, and was admitted to the hospital with general surgery and GI consultation. MRCP was negative for choledocholithiasis, and eventually was taken to the OR on 1/6 Status post cholecystectomy  Subjective / 24h Interval events: Seen postop, a bit groggy.  Complains of abdominal pain.  No nausea  Assesement and Plan: Principal Problem:   Acute pancreatitis Active Problems:   Hyperlipidemia   S/P CABG x 3   Coronary artery disease   HTN (hypertension)  Principal problem Acute gallstone pancreatitis-GI and general surgery were consulted and followed patient while hospitalized.  She initially underwent an MRCP which was negative for choledocholithiasis.  With improvement in her pancreatitis, she was eventually taken to the OR on 08/11/2022 and she is status post laparoscopic cholecystectomy.  Lipase improving, clinically improving.  Will allow clear liquid diet postoperatively, advance as tolerated.  Active problems Coronary artery disease, history of CABG/PCI-no chest pain, this is stable.  Resume Plavix postoperatively when okay with surgery  Essential hypertension-monitor blood pressure, resume home metoprolol  Hyperlipidemia-resume home statin  Elevated LFTs-due to #1, overall improving  Scheduled Meds:  acetaminophen  650 mg Oral Q6H    docusate sodium  100 mg Oral BID   metoprolol succinate       Continuous Infusions:  lactated ringers 200 mL/hr at 08/11/22 0954   PRN Meds:.HYDROmorphone (DILAUDID) injection, metoprolol succinate, morphine injection, naLOXone (NARCAN)  injection, ondansetron (ZOFRAN) IV, oxyCODONE, oxyCODONE, prochlorperazine, simethicone  Current Outpatient Medications  Medication Instructions   alum & mag hydroxide-simeth (MAALOX MAX) 400-400-40 MG/5ML suspension 15 mLs, Oral, Every 6 hours PRN   aspirin EC 81 mg, Oral, Every morning   atorvastatin (LIPITOR) 80 mg, Oral, Daily   Cholecalciferol (VITAMIN D-3 PO) 1 capsule, Oral, Every morning   clopidogrel (PLAVIX) 75 mg, Oral, Daily   ezetimibe (ZETIA) 10 mg, Oral, Daily   famotidine (PEPCID) 20 mg, Oral, Daily   ferrous [id number] C-folic acid (FEROCON) capsule 1 capsule, Oral, 2 times daily   metoprolol succinate (TOPROL-XL) 12.5 mg, Oral, Daily   nitroGLYCERIN (NITROSTAT) 0.4 MG SL tablet PLACE 1 TABLET (0.4 MG TOTAL) UNDER THE TONGUE EVERY 5 (FIVE) MINUTES X 3 DOSES AS NEEDED FOR CHEST PAIN.   ondansetron (ZOFRAN) 4 mg, Oral, Every 6 hours   TART CHERRY PO 1 capsule, Oral, Daily at bedtime    Diet Orders (From admission, onward)     Start     Ordered   08/10/22 0645  Diet NPO time specified  Diet effective now        08/10/22 0646            DVT prophylaxis: SCDs Start: 08/10/22 0645   Lab Results  Component Value Date   PLT 176 08/10/2022      Code Status: Full Code  Family Communication: family at bedside   Status is: Inpatient  Remains inpatient  appropriate because: OR today    Level of care: Med-Surg  Consultants:  General surgery GI  Objective: Vitals:   08/11/22 0650 08/11/22 0900 08/11/22 0915 08/11/22 0930  BP:  (!) 139/49 133/65 132/68  Pulse:  73 69 71  Resp:  18 11 15   Temp:  97.6 F (36.4 C)  97.7 F (36.5 C)  TempSrc:      SpO2:  91% 100% 100%  Weight: 67.1 kg     Height: 4' 11.49"  (1.511 m)       Intake/Output Summary (Last 24 hours) at 08/11/2022 1014 Last data filed at 08/11/2022 0826 Gross per 24 hour  Intake 5272.01 ml  Output --  Net 5272.01 ml   Wt Readings from Last 3 Encounters:  08/11/22 67.1 kg  08/05/22 67.1 kg  03/01/22 66 kg    Examination:  Constitutional: NAD Eyes: no scleral icterus ENMT: Mucous membranes are moist.  Neck: normal, supple Respiratory: clear to auscultation bilaterally, no wheezing, no crackles. Normal respiratory effort. No accessory muscle use.  Cardiovascular: Regular rate and rhythm, no murmurs / rubs / gallops. No LE edema.  Abdomen: non distended   Data Reviewed: I have independently reviewed following labs and imaging studies   CBC Recent Labs  Lab 08/05/22 1346 08/05/22 2223 08/09/22 1745 08/10/22 0825  WBC 8.8 8.4 11.0* 5.6  HGB 15.0 14.9 14.9 13.3  HCT 47.4* 45.1 45.6 40.4  PLT 217 227 225 176  MCV 100.6* 96.6 97.4 96.4  MCH 31.8 31.9 31.8 31.7  MCHC 31.6 33.0 32.7 32.9  RDW 12.0 12.2 11.9 12.0  LYMPHSABS  --  2.5 1.1  --   MONOABS  --  0.6 0.8  --   EOSABS  --  0.2 0.1  --   BASOSABS  --  0.0 0.0  --     Recent Labs  Lab 08/05/22 1346 08/05/22 2223 08/09/22 1745 08/10/22 0825  NA 140 141 139 140  K 4.2 4.1 4.3 4.0  CL 110 107 105 106  CO2 19* 26 24 26   GLUCOSE 98 157* 104* 97  BUN 20 20 18 15   CREATININE 0.86 0.95 1.02* 0.99  CALCIUM 8.8* 8.9 9.0 8.7*  AST 27 28 285* 139*  ALT 26 27 185* 191*  ALKPHOS 93 91 123 122  BILITOT 0.6 0.4 1.8* 0.9  ALBUMIN 3.9 4.0 4.0 3.5    ------------------------------------------------------------------------------------------------------------------ No results for input(s): "CHOL", "HDL", "LDLCALC", "TRIG", "CHOLHDL", "LDLDIRECT" in the last 72 hours.  Lab Results  Component Value Date   HGBA1C 5.6 07/17/2020   ------------------------------------------------------------------------------------------------------------------ No results for  input(s): "TSH", "T4TOTAL", "T3FREE", "THYROIDAB" in the last 72 hours.  Invalid input(s): "FREET3"  Cardiac Enzymes No results for input(s): "CKMB", "TROPONINI", "MYOGLOBIN" in the last 168 hours.  Invalid input(s): "CK" ------------------------------------------------------------------------------------------------------------------ No results found for: "BNP"  CBG: No results for input(s): "GLUCAP" in the last 168 hours.  Recent Results (from the past 240 hour(s))  Surgical pcr screen     Status: None   Collection Time: 08/10/22  9:05 PM   Specimen: Nasal Mucosa; Nasal Swab  Result Value Ref Range Status   MRSA, PCR NEGATIVE NEGATIVE Final   Staphylococcus aureus NEGATIVE NEGATIVE Final    Comment: (NOTE) The Xpert SA Assay (FDA approved for NASAL specimens in patients 49 years of age and older), is one component of a comprehensive surveillance program. It is not intended to diagnose infection nor to guide or monitor treatment. Performed at [facility], [address], [place], [place] [address]      Radiology Studies: No results found.   Pamella Pert, MD, PhD [facility]  Between 7 am - 7 pm I am available, please contact me via Amion (for emergencies) or Securechat (non urgent messages)  Between 7 pm - 7 am I am not available, please contact night coverage MD/APP via Amion

## 2022-08-11 NOTE — Progress Notes (Signed)
MRCP with cholecystitis findings, no choledocholithiasis or pancreatitis.  I recommended laparoscopic cholecystectomy.  We discussed the procedure, its risks, benefits and alternatives.  After a full discussion and all questions answered the patient granted consent to proceed.  We will proceed as scheduled.  Felicie Morn, MD General, Bariatric and Minimally Invasive Surgery [facility]

## 2022-08-11 NOTE — Transfer of Care (Signed)
Immediate Anesthesia Transfer of Care Note  Patient: Brenda Wilkins  Procedure(s) Performed: LAPAROSCOPIC CHOLECYSTECTOMY HERNIA REPAIR UMBILICAL ADULT (Abdomen)  Patient Location: PACU  Anesthesia Type:General  Level of Consciousness: patient cooperative and responds to stimulation  Airway & Oxygen Therapy: Patient Spontanous Breathing  Post-op Assessment: Report given to RN, Post -op Vital signs reviewed and stable, and Patient moving all extremities X 4  Post vital signs: Reviewed and stable  Last Vitals:  Vitals Value Taken Time  BP 139/49 08/11/22 0900  Temp    Pulse 71 08/11/22 0902  Resp 12 08/11/22 0901  SpO2 92% 08/11/22 0902  Vitals shown include unvalidated device data.  Last Pain:  Vitals:   08/11/22 0644  TempSrc: Oral  PainSc:          Complications: No notable events documented.

## 2022-08-11 NOTE — Anesthesia Procedure Notes (Signed)
Procedure Name: Intubation Date/Time: 08/11/2022 7:43 AM  Performed by: Betha Loa, CRNAPre-anesthesia Checklist: Patient identified, Emergency Drugs available, Suction available and Patient being monitored Patient Re-evaluated:Patient Re-evaluated prior to induction Oxygen Delivery Method: Circle System Utilized Preoxygenation: Pre-oxygenation with 100% oxygen Induction Type: IV induction Ventilation: Mask ventilation without difficulty Laryngoscope Size: Mac and 3 Grade View: Grade III Tube type: Oral Tube size: 7.0 mm Number of attempts: 1 Airway Equipment and Method: Stylet and Oral airway Placement Confirmation: ETT inserted through vocal cords under direct vision, positive ETCO2 and breath sounds checked- equal and bilateral Secured at: 21 cm Tube secured with: Tape Dental Injury: Teeth and Oropharynx as per pre-operative assessment

## 2022-08-11 NOTE — Op Note (Signed)
Patient: Brenda Wilkins ([birth year], [id number])  Date of Surgery: 08/11/22  Preoperative Diagnosis: Cholecystitis   Postoperative Diagnosis: Cholecystitis   Surgical Procedure: LAPAROSCOPIC CHOLECYSTECTOMY:  HERNIA REPAIR UMBILICAL ADULT:    Operative Team Members:  Surgeon(s) and Role:    * Shayona Hibbitts, Nickola Major, MD - Primary   Anesthesiologist: Nolon Nations, MD CRNA: Amadeo Garnet, CRNA; Betha Loa, CRNA   Anesthesia: General   Fluids:  Total I/O In: 1000 [Z.C.:5885] Out: -   Complications: None  Drains:  none   Specimen:  ID Type Source Tests Collected by Time Destination  1 : Gallbladder GI Gallbladder SURGICAL PATHOLOGY Zariel Capano, Nickola Major, MD 08/11/2022 0807      Disposition:  PACU - hemodynamically stable.  Plan of Care:  Continue inpatient care    Indications for Procedure: Brenda Wilkins is a 77 y.o. female who presented with abdominal pain.  History, physical and imaging was concerning for cholecystitis.  Laparoscopic cholecystectomy was recommended for the patient.  The procedure itself, as well as the risks, benefits and alternatives were discussed with the patient.  Risks discussed included but were not limited to the risk of infection, bleeding, damage to nearby structures, need to convert to open procedure, incisional hernia, bile leak, common bile duct injury and the need for additional procedures or surgeries.  With this discussion complete and all questions answered the patient granted consent to proceed.  Findings: Inflamed gallbladder  Infection status: Patient: Brenda Wilkins Emergency General Surgery Service Patient Case: Urgent Infection Present At Time Of Surgery (PATOS):  Inflamed gallbladder, spillage of bile during the case   Description of Procedure:   On the date stated above, the patient was taken to the operating room suite and placed in supine positioning.  Sequential compression devices were placed on the lower extremities  to prevent blood clots.  General endotracheal anesthesia was induced. Preoperative antibiotics were given.  The patient's abdomen was prepped and draped in the usual sterile fashion.  A time-out was completed verifying the correct patient, procedure, positioning and equipment needed for the case.  We began by anesthetizing the skin with local anesthetic and then making a 12 mm incision just below the umbilicus.  She had a small umbilical hernia and a 02DX trocar was inserted through this into the abdomen.  The abdomen was inflated to 8mm hg.    There was no trauma to the underlying viscera with initial trocar placement.  Any abnormal findings, other than inflammation in the right upper quadrant, are listed above in the findings section.  Three additional trocars were placed, one 5 mm trocar in the subxiphoid position, one 5 mm trocar in the midline epigastric area and one 30mm trocar in the right upper quadrant subcostally.  These were placed under direct vision without any trauma to the underlying viscera.    The patient was then placed in head up, left side down positioning.  The gallbladder was identified and dissected free from its attachments to the omentum allowing the duodenum to fall away.  The infundibulum of the gallbladder was dissected free working laterally to medially.  The cystic duct and cystic artery were dissected free from surrounding connective tissue.  The infundibulum of the gallbladder was dissected off the cystic plate.  A critical view of safety was obtained with the cystic duct and cystic artery being cleared of connective tissues and clearly the only two structures entering into the gallbladder with the liver clearly visible behind.  Clips were then applied to the  cystic duct and cystic artery and then these structures were divided.  A PDS endoloop was placed on the cystic duct stump.   The gallbladder was dissected off the cystic plate, placed in an endocatch bag and removed from the  12 mm subxiphoid port site.  The clips were inspected and appeared effective.  The cystic plate was inspected and hemostasis was obtained using electrocautery.  A suction irrigator was used to clean the operative field.  Attention was turned to closure.  The abdomen was desufflated.  The 12 mm periumbilical port site was closed using three figure of 8 0-vicryl sutures.  A monocryl was used to tack the base of the umbilicus back to the fascia.  The skin was closed using 4-0 monocryl and dermabond.  All sponge and needle counts were correct at the conclusion of the case.    Ivar Drape, MD General, Bariatric, & Minimally Invasive Surgery [facility], [place]

## 2022-08-12 ENCOUNTER — Encounter (HOSPITAL_COMMUNITY): Payer: Self-pay | Admitting: Surgery

## 2022-08-12 DIAGNOSIS — I10 Essential (primary) hypertension: Secondary | ICD-10-CM | POA: Diagnosis not present

## 2022-08-12 DIAGNOSIS — Z951 Presence of aortocoronary bypass graft: Secondary | ICD-10-CM

## 2022-08-12 DIAGNOSIS — I2510 Atherosclerotic heart disease of native coronary artery without angina pectoris: Secondary | ICD-10-CM

## 2022-08-12 DIAGNOSIS — K851 Biliary acute pancreatitis without necrosis or infection: Secondary | ICD-10-CM | POA: Diagnosis not present

## 2022-08-12 DIAGNOSIS — I2583 Coronary atherosclerosis due to lipid rich plaque: Secondary | ICD-10-CM

## 2022-08-12 LAB — CBC
HCT: 38.1 % (ref 36.0–46.0)
Hemoglobin: 12.7 g/dL (ref 12.0–15.0)
MCH: 31.8 pg (ref 26.0–34.0)
MCHC: 33.3 g/dL (ref 30.0–36.0)
MCV: 95.5 fL (ref 80.0–100.0)
Platelets: 216 10*3/uL (ref 150–400)
RBC: 3.99 MIL/uL (ref 3.87–5.11)
RDW: 11.8 % (ref 11.5–15.5)
WBC: 9.9 10*3/uL (ref 4.0–10.5)
nRBC: 0 % (ref 0.0–0.2)

## 2022-08-12 LAB — BASIC METABOLIC PANEL
Anion gap: 8 (ref 5–15)
BUN: 13 mg/dL (ref 8–23)
CO2: 25 mmol/L (ref 22–32)
Calcium: 8.1 mg/dL — ABNORMAL LOW (ref 8.9–10.3)
Chloride: 103 mmol/L (ref 98–111)
Creatinine, Ser: 0.99 mg/dL (ref 0.44–1.00)
GFR, Estimated: 60 mL/min — ABNORMAL LOW (ref >60)
Glucose, Bld: 112 mg/dL — ABNORMAL HIGH (ref 70–99)
Potassium: 4.1 mmol/L (ref 3.5–5.1)
Sodium: 136 mmol/L (ref 135–145)

## 2022-08-12 MED ORDER — OXYCODONE HCL 5 MG PO TABS: 5.0000 mg | ORAL_TABLET | Freq: Four times a day (QID) | ORAL | 0 refills | Status: DC | PRN

## 2022-08-12 NOTE — Progress Notes (Signed)
Brenda Wilkins to be D/C'd  per MD order.  Discussed with the patient and all questions fully answered.  VSS, Skin clean, dry and intact without evidence of skin break down, no evidence of skin tears noted.  IV catheter discontinued intact. Site without signs and symptoms of complications. Dressing and pressure applied.  An After Visit Summary was printed and given to the patient.   D/c education completed with patient/family including follow up instructions, medication list, d/c activities limitations if indicated, with other d/c instructions as indicated by MD - patient able to verbalize understanding, all questions fully answered.   Patient instructed to return to ED, call 911, or call MD for any changes in condition.   Patient to be escorted via [place], and D/C home via private auto.

## 2022-08-12 NOTE — Discharge Summary (Signed)
Physician Discharge Summary  Brenda Wilkins YHC:[id number] DOB: [birth year] DOA: 08/09/2022  PCP: Brenda Wilkins  Admit date: 08/09/2022 Discharge date: 08/12/2022  Admitted From: home Disposition:  home  Recommendations for Outpatient Follow-up:  Follow up with PCP in 1-2 weeks  Home Health: none Equipment/Devices: none  Discharge Condition: stable CODE STATUS: Full code Diet Orders (From admission, onward)     Start     Ordered   08/12/22 0845  Diet Heart Room service appropriate? Yes; Fluid consistency: Thin  Diet effective now       Question Answer Comment  Room service appropriate? Yes   Fluid consistency: Thin      08/12/22 0844            HPI: Wilkins admitting MD, Brenda Wilkins is a 77 y.o. female with medical history significant of CAD status post CABG and PCI, hypertension, hyperlipidemia, GERD presented to the ED with epigastric abdominal pain, nausea, and vomiting.  Vital signs stable.  Labs significant for WBC 11.0, creatinine 1.0, AST 285, ALT 185, alk phos 123, T. bili 1.8, UA not suggestive of infection, troponin negative x 2.  Right upper quadrant ultrasound showing mildly echogenic focus in the gallbladder which may represent a poorly calcified stone or a small polyp. ED physician discussed the case with Dr. Janee Morn with general surgery who recommended GI involvement.  General surgery will consult in the morning.  [facility] (Dr. Orvan Falconer) consulted and recommended MRCP.  Patient was given IV fluids.  [facility] called to admit. Patient is reporting epigastric abdominal pain and vomiting x 5 days, multiple recent ED visits.  States after her last ED her symptoms improved and she was able to tolerate p.o. intake.  She ate breakfast yesterday but around noon started experiencing symptoms again and since then has not been able to eat.  She is reporting constant severe epigastric abdominal pain.  Denies history of alcohol use or gallstones.  Denies shortness of breath or chest  pain.  Hospital Course / Discharge diagnoses: Principal Problem:   Acute pancreatitis Active Problems:   Hyperlipidemia   S/P CABG x 3   Coronary artery disease   HTN (hypertension)   Principal problem Acute gallstone pancreatitis-GI and general surgery were consulted and followed patient while hospitalized.  She initially underwent an MRCP which was negative for choledocholithiasis.  With improvement in her pancreatitis, she was eventually taken to the OR on 08/11/2022 and she is status post laparoscopic cholecystectomy.  She is improving clinically, she is able to tolerate a regular diet, and will be discharged home in stable condition with outpatient follow-up.   Active problems Coronary artery disease, history of CABG/PCI-no chest pain, this is stable.  Outpatient follow-up with cardiology  Essential hypertension-monitor blood pressure, resume home metoprolol Hyperlipidemia-resume home statin Elevated LFTs-due to #1, overall improving  Sepsis ruled out   Discharge Instructions   Allergies as of 08/12/2022   No Known Allergies      Medication List     TAKE these medications    aspirin EC 81 MG tablet Take 81 mg by mouth in the morning.   atorvastatin 80 MG tablet Commonly known as: LIPITOR Take 1 tablet (80 mg total) by mouth daily. What changed: when to take this   clopidogrel 75 MG tablet Commonly known as: PLAVIX Take 1 tablet (75 mg total) by mouth daily. What changed: when to take this   ezetimibe 10 MG tablet Commonly known as: ZETIA Take 1 tablet (10 mg total) by  mouth daily. What changed: when to take this   famotidine 20 MG tablet Commonly known as: PEPCID Take 1 tablet (20 mg total) by mouth daily. What changed: when to take this   Ferocon capsule Generic drug: ferrous [id number] C-folic acid Take 1 capsule by mouth 2 (two) times daily.   Maalox Max [id number] MG/5ML suspension Generic drug: alum & mag hydroxide-simeth Take 15 mLs  by mouth every 6 (six) hours as needed for indigestion.   metoprolol succinate 25 MG 24 hr tablet Commonly known as: TOPROL-XL Take 0.5 tablets (12.5 mg total) by mouth daily. What changed: when to take this   nitroGLYCERIN 0.4 MG SL tablet Commonly known as: NITROSTAT PLACE 1 TABLET (0.4 MG TOTAL) UNDER THE TONGUE EVERY 5 (FIVE) MINUTES X 3 DOSES AS NEEDED FOR CHEST PAIN.   ondansetron 4 MG tablet Commonly known as: ZOFRAN Take 1 tablet (4 mg total) by mouth every 6 (six) hours.   oxyCODONE 5 MG immediate release tablet Commonly known as: Oxy IR/ROXICODONE Take 1 tablet (5 mg total) by mouth every 6 (six) hours as needed for moderate pain.   TART CHERRY PO Take 1 capsule by mouth at bedtime.   VITAMIN D-3 PO Take 1 capsule by mouth in the morning.        Follow-up Information     Maczis, Carlena Hurl, PA-C Follow up.   Specialty: General Surgery Why: We are making a follow up appointment for you., Please call to confirm appointment time., Arrive 30 minutes early to complete check in, and bring photo ID and insurance card. Contact information: [place] [address] [phone]                 Consultations: General surgery GI  Procedures/Studies:  MR ABDOMEN MRCP W WO CONTAST  Result Date: 08/10/2022 CLINICAL DATA:  Abdominal pain with vomiting. Pancreatitis suspected. EXAM: MRI ABDOMEN WITHOUT AND WITH CONTRAST (INCLUDING MRCP) TECHNIQUE: Multiplanar multisequence MR imaging of the abdomen was performed both before and after the administration of intravenous contrast. Heavily T2-weighted images of the biliary and pancreatic ducts were obtained, and three-dimensional MRCP images were rendered by post processing. CONTRAST:  6mL GADAVIST GADOBUTROL 1 MMOL/ML IV SOLN COMPARISON:  Ultrasound exam 08/09/2022. Abdomen pelvis CT 08/06/2022. FINDINGS: Lower chest: Unremarkable. Hepatobiliary: Tiny cyst noted  posterior right liver. No followup imaging is recommended. Adjacent tiny 2 mm gallstones are seen in the body of the gallbladder, best visualized on 3D MRCP image 26 of series 15. There also appears to be a tiny 2 mm stone in the neck of the gallbladder (image 12/series 15). There is gallbladder wall edema towards the fundus on today's study with probable trace pericholecystic fluid. No intra or extrahepatic biliary duct dilatation. Common bile duct measures 4 mm diameter. No choledocholithiasis. Pancreas: No appreciable pancreatic or peripancreatic edema. No main duct dilatation. No focal pancreatic mass lesion. Spleen:  No splenomegaly. No focal mass lesion. Adrenals/Urinary Tract: No adrenal nodule or mass. 4.4 cm exophytic simple cyst noted upper interpolar right kidney. No followup imaging is recommended. Left kidney unremarkable. Stomach/Bowel: Stomach is unremarkable. No gastric wall thickening. No evidence of outlet obstruction. Duodenum is normally positioned as is the ligament of Treitz. No small bowel or colonic dilatation within the visualized abdomen. Vascular/Lymphatic: No abdominal aortic aneurysm. No abdominal lymphadenopathy. Other:  No intraperitoneal free fluid. Musculoskeletal: No focal suspicious marrow enhancement within the visualized bony anatomy. IMPRESSION: 1. Tiny 2 mm gallstones in the  body and neck of the gallbladder. There is gallbladder wall edema towards the fundus with probable trace pericholecystic fluid. Imaging features are compatible with acute cholecystitis. 2. No intra or extrahepatic biliary duct dilatation. No choledocholithiasis. 3. No evidence for pancreatic or peripancreatic edema. No main duct dilatation in the pancreas. Electronically Signed   By: Misty Stanley M.D.   On: 08/10/2022 08:34   MR 3D Recon At Scanner  Result Date: 08/10/2022 CLINICAL DATA:  Abdominal pain with vomiting. Pancreatitis suspected. EXAM: MRI ABDOMEN WITHOUT AND WITH CONTRAST (INCLUDING MRCP)  TECHNIQUE: Multiplanar multisequence MR imaging of the abdomen was performed both before and after the administration of intravenous contrast. Heavily T2-weighted images of the biliary and pancreatic ducts were obtained, and three-dimensional MRCP images were rendered by post processing. CONTRAST:  78mL GADAVIST GADOBUTROL 1 MMOL/ML IV SOLN COMPARISON:  Ultrasound exam 08/09/2022. Abdomen pelvis CT 08/06/2022. FINDINGS: Lower chest: Unremarkable. Hepatobiliary: Tiny cyst noted posterior right liver. No followup imaging is recommended. Adjacent tiny 2 mm gallstones are seen in the body of the gallbladder, best visualized on 3D MRCP image 26 of series 15. There also appears to be a tiny 2 mm stone in the neck of the gallbladder (image 12/series 15). There is gallbladder wall edema towards the fundus on today's study with probable trace pericholecystic fluid. No intra or extrahepatic biliary duct dilatation. Common bile duct measures 4 mm diameter. No choledocholithiasis. Pancreas: No appreciable pancreatic or peripancreatic edema. No main duct dilatation. No focal pancreatic mass lesion. Spleen:  No splenomegaly. No focal mass lesion. Adrenals/Urinary Tract: No adrenal nodule or mass. 4.4 cm exophytic simple cyst noted upper interpolar right kidney. No followup imaging is recommended. Left kidney unremarkable. Stomach/Bowel: Stomach is unremarkable. No gastric wall thickening. No evidence of outlet obstruction. Duodenum is normally positioned as is the ligament of Treitz. No small bowel or colonic dilatation within the visualized abdomen. Vascular/Lymphatic: No abdominal aortic aneurysm. No abdominal lymphadenopathy. Other:  No intraperitoneal free fluid. Musculoskeletal: No focal suspicious marrow enhancement within the visualized bony anatomy. IMPRESSION: 1. Tiny 2 mm gallstones in the body and neck of the gallbladder. There is gallbladder wall edema towards the fundus with probable trace pericholecystic fluid.  Imaging features are compatible with acute cholecystitis. 2. No intra or extrahepatic biliary duct dilatation. No choledocholithiasis. 3. No evidence for pancreatic or peripancreatic edema. No main duct dilatation in the pancreas. Electronically Signed   By: Misty Stanley M.D.   On: 08/10/2022 08:34   US Abdomen Limited RUQ (LIVER/GB)  Result Date: 08/09/2022 CLINICAL DATA:  Right upper quadrant pain EXAM: ULTRASOUND ABDOMEN LIMITED RIGHT UPPER QUADRANT COMPARISON:  CT from 08/06/2022 FINDINGS: Gallbladder: Gallbladder is well distended. Echogenic focus is noted without mobility which may represent a small adherent stone without significant calcification. Small polyp would deserve consideration as well. No other filling defects are noted. Common bile duct: Diameter: 5.7 mm Liver: No focal lesion identified. Within normal limits in parenchymal echogenicity. Portal vein is patent on color Doppler imaging with normal direction of blood flow towards the liver. Other: Incidental note is made of 3.7 cm simple appearing right renal cyst. IMPRESSION: Mildly echogenic focus in calcification in the gallbladder. This may represent a poorly calcified stone or small polyp. Simple renal cyst.  No follow-up is recommended. Electronically Signed   By: Inez Catalina M.D.   On: 08/09/2022 23:38   CT ABDOMEN PELVIS W CONTRAST  Result Date: 08/06/2022 CLINICAL DATA:  Nonlocalized abdominal pain EXAM: CT ABDOMEN AND PELVIS WITH CONTRAST  TECHNIQUE: Multidetector CT imaging of the abdomen and pelvis was performed using the standard protocol following bolus administration of intravenous contrast. RADIATION DOSE REDUCTION: This exam was performed according to the departmental dose-optimization program which includes automated exposure control, adjustment of the mA and/or kV according to patient size and/or use of iterative reconstruction technique. CONTRAST:  28mL OMNIPAQUE IOHEXOL 350 MG/ML SOLN COMPARISON:  None Available. FINDINGS:  Lower Chest: Normal. Hepatobiliary: Normal hepatic contours. No intra- or extrahepatic biliary dilatation. The gallbladder is normal. Pancreas: Normal pancreas. No ductal dilatation or peripancreatic fluid collection. Spleen: Normal. Adrenals/Urinary Tract: The adrenal glands are normal. No hydronephrosis, nephroureterolithiasis or solid renal mass. Simple right renal cyst measures 4.4 cm (Bosniak class 1, no further imaging recommended). The urinary bladder is normal for degree of distention Stomach/Bowel: Small sliding hiatal hernia. Normal duodenal course and caliber. No small bowel dilatation or inflammation. No focal colonic abnormality. Normal appendix. Vascular/Lymphatic: There is calcific atherosclerosis of the abdominal aorta. No lymphadenopathy. Reproductive: Normal uterus. No adnexal mass. Other: None. Musculoskeletal: No bony spinal canal stenosis or focal osseous abnormality. IMPRESSION: 1. No acute abnormality of the abdomen or pelvis. 2. Small sliding hiatal hernia. Aortic Atherosclerosis (ICD10-I70.0). Electronically Signed   By: Ulyses Jarred M.D.   On: 08/06/2022 03:31   DG Chest 1 View  Result Date: 08/05/2022 CLINICAL DATA:  Chest pain just below the xiphoid process. EXAM: CHEST  1 VIEW COMPARISON:  Two-view chest x-ray 05/07/2021 FINDINGS: The heart size and mediastinal contours are within normal limits. Both lungs are clear. The visualized skeletal structures are unremarkable. IMPRESSION: Negative one view chest x-ray Electronically Signed   By: San Morelle M.D.   On: 08/05/2022 14:20     Subjective: - no chest pain, shortness of breath, no abdominal pain, nausea or vomiting.   Discharge Exam: BP (!) 105/54   Pulse 63   Temp 98.1 F (36.7 C) (Oral)   Resp 18   Ht 4' 11.49" (1.511 m)   Wt 67.1 kg   SpO2 94%   BMI 29.39 kg/m   General: Pt is alert, awake, not in acute distress Cardiovascular: RRR, S1/S2 +, no rubs, no gallops Respiratory: CTA bilaterally, no  wheezing, no rhonchi Abdominal: Soft, NT, ND, bowel sounds + Extremities: no edema, no cyanosis  The results of significant diagnostics from this hospitalization (including imaging, microbiology, ancillary and laboratory) are listed below for reference.     Microbiology: Recent Results (from the past 240 hour(s))  Surgical pcr screen     Status: None   Collection Time: 08/10/22  9:05 PM   Specimen: Nasal Mucosa; Nasal Swab  Result Value Ref Range Status   MRSA, PCR NEGATIVE NEGATIVE Final   Staphylococcus aureus NEGATIVE NEGATIVE Final    Comment: (NOTE) The Xpert SA Assay (FDA approved for NASAL specimens in patients 60 years of age and older), is one component of a comprehensive surveillance program. It is not intended to diagnose infection nor to guide or monitor treatment. Performed at [facility], [place] [address] [place], [place] [address]      Labs: Basic Metabolic Panel: Recent Labs  Lab 08/05/22 1346 08/05/22 2223 08/09/22 1745 08/10/22 0825 08/12/22 0207  NA 140 141 139 140 136  K 4.2 4.1 4.3 4.0 4.1  CL 110 107 105 106 103  CO2 19* 26 24 26 25   GLUCOSE 98 157* 104* 97 112*  BUN 20 20 18 15 13   CREATININE 0.86 0.95 1.02* 0.99 0.99  CALCIUM 8.8* 8.9  9.0 8.7* 8.1*   Liver Function Tests: Recent Labs  Lab 08/05/22 1346 08/05/22 2223 08/09/22 1745 08/10/22 0825  AST 27 28 285* 139*  ALT 26 27 185* 191*  ALKPHOS 93 91 123 122  BILITOT 0.6 0.4 1.8* 0.9  PROT 6.9 7.1 7.2 6.3*  ALBUMIN 3.9 4.0 4.0 3.5   CBC: Recent Labs  Lab 08/05/22 1346 08/05/22 2223 08/09/22 1745 08/10/22 0825 08/12/22 0207  WBC 8.8 8.4 11.0* 5.6 9.9  NEUTROABS  --  5.1 9.1*  --   --   HGB 15.0 14.9 14.9 13.3 12.7  HCT 47.4* 45.1 45.6 40.4 38.1  MCV 100.6* 96.6 97.4 96.4 95.5  PLT 217 227 225 176 216   CBG: No results for input(s): "GLUCAP" in the last 168 hours. Hgb A1c No results for input(s): "HGBA1C" in the last 72 hours. Lipid Profile No results for  input(s): "CHOL", "HDL", "LDLCALC", "TRIG", "CHOLHDL", "LDLDIRECT" in the last 72 hours. Thyroid function studies No results for input(s): "TSH", "T4TOTAL", "T3FREE", "THYROIDAB" in the last 72 hours.  Invalid input(s): "FREET3" Urinalysis    Component Value Date/Time   COLORURINE YELLOW 08/09/2022 1711   APPEARANCEUR HAZY (A) 08/09/2022 1711   LABSPEC 1.017 08/09/2022 1711   PHURINE 6.0 08/09/2022 1711   GLUCOSEU NEGATIVE 08/09/2022 1711   HGBUR NEGATIVE 08/09/2022 1711   BILIRUBINUR NEGATIVE 08/09/2022 1711   KETONESUR NEGATIVE 08/09/2022 1711   PROTEINUR NEGATIVE 08/09/2022 1711   NITRITE NEGATIVE 08/09/2022 1711   LEUKOCYTESUR TRACE (A) 08/09/2022 1711    FURTHER DISCHARGE INSTRUCTIONS:   Get Medicines reviewed and adjusted: Please take all your medications with you for your next visit with your Primary MD   Laboratory/radiological data: Please request your Primary MD to go over all hospital tests and procedure/radiological results at the follow up, please ask your Primary MD to get all Hospital records sent to his/her office.   In some cases, they will be blood work, cultures and biopsy results pending at the time of your discharge. Please request that your primary care M.D. goes through all the records of your hospital data and follows up on these results.   Also Note the following: If you experience worsening of your admission symptoms, develop shortness of breath, life threatening emergency, suicidal or homicidal thoughts you must seek medical attention immediately by calling 911 or calling your MD immediately  if symptoms less severe.   You must read complete instructions/literature along with all the possible adverse reactions/side effects for all the Medicines you take and that have been prescribed to you. Take any new Medicines after you have completely understood and accpet all the possible adverse reactions/side effects.    Do not drive when taking Pain medications  or sleeping medications (Benzodaizepines)   Do not take more than prescribed Pain, Sleep and Anxiety Medications. It is not advisable to combine anxiety,sleep and pain medications without talking with your primary care practitioner   Special Instructions: If you have smoked or chewed Tobacco  in the last 2 yrs please stop smoking, stop any regular Alcohol  and or any Recreational drug use.   Wear Seat belts while driving.   Please note: You were cared for by a hospitalist during your hospital stay. Once you are discharged, your primary care physician will handle any further medical issues. Please note that NO REFILLS for any discharge medications will be authorized once you are discharged, as it is imperative that you return to your primary care physician (or establish a relationship with  a primary care physician if you do not have one) for your post hospital discharge needs so that they can reassess your need for medications and monitor your lab values.  Time coordinating discharge: 40 minutes  SIGNED:  Marzetta Board, MD, PhD 08/12/2022, 1:38 PM

## 2022-08-12 NOTE — Progress Notes (Signed)
1 Day Post-Op   Subjective/Chief Complaint: Patient is sore, but feels better than before surgery Tolerating clears -  no nausea    Objective: Vital signs in last 24 hours: Temp:  [97.6 F (36.4 C)-98.5 F (36.9 C)] 98.1 F (36.7 C) (01/07 0439) Pulse Rate:  [62-73] 62 (01/07 0439) Resp:  [11-18] 18 (01/07 0439) BP: (93-139)/(43-75) 93/43 (01/07 0439) SpO2:  [91 %-100 %] 94 % (01/07 0439) Last BM Date : 08/10/22  Intake/Output from previous day: 01/06 0701 - 01/07 0700 In: 1236 [P.O.:236; I.V.:1000] Out: -  Intake/Output this shift: No intake/output data recorded.  WDWN in NAD Abd - soft, incisional tenderness Incisions c/d/i  Lab Results:  Recent Labs    08/10/22 0825 08/12/22 0207  WBC 5.6 9.9  HGB 13.3 12.7  HCT 40.4 38.1  PLT 176 216   BMET Recent Labs    08/10/22 0825 08/12/22 0207  NA 140 136  K 4.0 4.1  CL 106 103  CO2 26 25  GLUCOSE 97 112*  BUN 15 13  CREATININE 0.99 0.99  CALCIUM 8.7* 8.1*   PT/INR No results for input(s): "LABPROT", "INR" in the last 72 hours. ABG No results for input(s): "PHART", "HCO3" in the last 72 hours.  Invalid input(s): "PCO2", "PO2"  Studies/Results: No results found.  Anti-infectives: Anti-infectives (From admission, onward)    None       Assessment/Plan: Acute calculus cholecystitis s/p laparoscopic cholecystectomy 08/11/22 Dr. Thermon Leyland Pancreatitis resolved  Advance to lowfat diet If patient tolerates diet, then she can be discharged home later today.  Follow-up instructions in chart.  Pain meds sent to her pharmacy.   LOS: 2 days    Brenda Wilkins 08/12/2022

## 2022-08-12 NOTE — Discharge Instructions (Signed)
[facility]  Please arrive at least 30 min before your appointment to complete your check in paperwork.  If you are unable to arrive 30 min prior to your appointment time we may have to cancel or reschedule you. LAPAROSCOPIC SURGERY: POST OP INSTRUCTIONS Always review your discharge instruction sheet given to you by the facility where your surgery was performed. IF YOU HAVE DISABILITY OR FAMILY LEAVE FORMS, YOU MUST BRING THEM TO THE OFFICE FOR PROCESSING.   DO NOT GIVE THEM TO YOUR DOCTOR.  PAIN CONTROL  First take acetaminophen (Tylenol) AND/or ibuprofen (Advil) to control your pain after surgery.  Follow directions on package.  Taking acetaminophen (Tylenol) and/or ibuprofen (Advil) regularly after surgery will help to control your pain and lower the amount of prescription pain medication you may need.  You should not take more than 4,000 mg (4 grams) of acetaminophen (Tylenol) in 24 hours.  You should not take ibuprofen (Advil), aleve, motrin, naprosyn or other NSAIDS if you have a history of stomach ulcers or chronic kidney disease.  A prescription for pain medication may be given to you upon discharge.  Take your pain medication as prescribed, if you still have uncontrolled pain after taking acetaminophen (Tylenol) or ibuprofen (Advil). Use ice packs to help control pain. If you need a refill on your pain medication, please contact your pharmacy.  They will contact our office to request authorization. Prescriptions will not be filled after 5pm or on week-ends.  HOME MEDICATIONS Take your usually prescribed medications unless otherwise directed.  DIET You should follow a light diet the first few days after arrival home.  Be sure to include lots of fluids daily. Avoid fatty, fried foods.   CONSTIPATION It is common to experience some constipation after surgery and if you are taking pain medication.  Increasing fluid intake and taking a stool softener (such as Colace)  will usually help or prevent this problem from occurring.  A mild laxative (Milk of Magnesia or Miralax) should be taken according to package instructions if there are no bowel movements after 48 hours.  WOUND/INCISION CARE Most patients will experience some swelling and bruising in the area of the incisions.  Ice packs will help.  Swelling and bruising can take several days to resolve.  Unless discharge instructions indicate otherwise, follow guidelines below  STERI-STRIPS - you may remove your outer bandages 48 hours after surgery, and you may shower at that time.  You have steri-strips (small skin tapes) in place directly over the incision.  These strips should be left on the skin for 7-10 days.   DERMABOND/SKIN GLUE - you may shower in 24 hours.  The glue will flake off over the next 2-3 weeks. Any sutures or staples will be removed at the office during your follow-up visit.  ACTIVITIES You may resume regular (light) daily activities beginning the next day--such as daily self-care, walking, climbing stairs--gradually increasing activities as tolerated.  You may have sexual intercourse when it is comfortable.  Refrain from any heavy lifting or straining until approved by your doctor. You may drive when you are no longer taking prescription pain medication, you can comfortably wear a seatbelt, and you can safely maneuver your car and apply brakes.  FOLLOW-UP You should see your doctor in the office for a follow-up appointment approximately 2-3 weeks after your surgery.  You should have been given your post-op/follow-up appointment when your surgery was scheduled.  If you did not receive a post-op/follow-up appointment, make sure   that you call for this appointment within a day or two after you arrive home to insure a convenient appointment time.   WHEN TO CALL YOUR DOCTOR: Fever over 101.0 Inability to urinate Continued bleeding from incision. Increased pain, redness, or drainage from the  incision. Increasing abdominal pain  The clinic staff is available to answer your questions during regular business hours.  Please don't hesitate to call and ask to speak to one of the nurses for clinical concerns.  If you have a medical emergency, go to the nearest emergency room or call 911.  A surgeon from [facility] is always on call at the hospital. [address], [address], [place], [place]  [address] ? [address] [address], [place],    [address] [phone] ? [phone] ? FAX [phone]  

## 2022-08-12 NOTE — Plan of Care (Signed)
?  Problem: Elimination: ?Goal: Will not experience complications related to bowel motility ?Outcome: Not Progressing ?  ?Problem: Pain Managment: ?Goal: General experience of comfort will improve ?Outcome: Not Progressing ?  ?

## 2022-08-13 ENCOUNTER — Encounter: Payer: Self-pay | Admitting: Cardiology

## 2022-08-14 LAB — SURGICAL PATHOLOGY

## 2022-08-16 NOTE — Progress Notes (Signed)
Office Visit    Patient Name: Brenda Wilkins Date of Encounter: 08/17/2022  PCP:  Patient, No Pcp Per   [facility]  Cardiologist:  Freada Bergeron, MD  Advanced Practice Provider:  No care team member to display Electrophysiologist:  None   HPI    Brenda Wilkins is a 77 y.o. female with a past medical history of known CAD status post CABG 07/26/2020 with subsequent NSTEMI's (08/07/2020 and 09/29/2020) found to have multiple occluded grafts status post PCI to SVG-diagonal and HLD presents today for follow-up appointment.  Patient sent for NSTEMI 07/2021 and found to have multivessel CAD on cardiac cath 07/17/2020 status post three-vessel CABG on 07/18/2020 (SVG to LAD, sequential SVG to diagonal and OM).  Postoperative course was complicated by recurrent chest pain causing her to represents to the ED on 08/07/2020.  In the ED, she was found to have elevated troponin consistent with NSTEMI.  Underwent repeat cath which revealed occluded SVG-LAD graft but TIMI-3 flow down the LAD from SVG-diagonal graft.  Case was discussed with CV surgery and given of risk TIMI-3 flow down the LAD from the other bypass graft, decision was made to manage medically.  Doing well until 09/29/2020 where she developed progressive chest pain.  Presented to [facility] on 10/02/2020 and underwent coronary angiography where she had 95% stenosis of the graft for the first diagonal and 100% stenosis of the origin of the graft between the first diagonal and OM 2.  A DES was placed in the origin of the diagonal branch SVG.  Her post cath course was complicated by a right groin hematoma and pseudoaneurysm.  Managed conservatively.  Call the office prior to her visit on 10/17/2020 as she noted increased swelling in her groin with a palpable firm mass at prior groin access site.  CBC was obtained which showed hemoglobin 12.6 which was improved from 10.3 on discharge.  Ultrasound consistent with hematoma but no  evidence of pseudoaneurysm.  During her visit, she was stable and tolerating her medications.  Groin site was sizable, firm hematoma which she had been monitoring and was not increasing.  Was seen in the clinic 08/2021 where she was doing well from a CV standpoint.  She was last seen 03/01/2022 and was feeling well at that time.  No chest pain.  Staying active and was able to walk 30 minutes 5 days a week without issues.  Tolerating medications without issues.  Blood pressure well-controlled.  Today, she states she was in the ER three times for stomach pain and vomiting. By the third time her lipase had increased to 1800 and she ended up having her gall bladder removed. She really has not had any pain since surgery on Saturday. No issues with her heart. No SOB or chest pain. Troponin negative in the hospital. No medication issues today.   Reports no shortness of breath nor dyspnea on exertion. Reports no chest pain, pressure, or tightness. No edema, orthopnea, PND. Reports no palpitations.   Past Medical History    Past Medical History:  Diagnosis Date   Coronary artery disease    Hyperlipidemia    Hypertension    NSTEMI (non-ST elevated myocardial infarction) ([place]) 07/16/2020   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   Past Surgical History:  Procedure Laterality Date   CARDIAC CATHETERIZATION  08/08/2020   CHOLECYSTECTOMY N/A 08/11/2022   Procedure: LAPAROSCOPIC CHOLECYSTECTOMY;  Surgeon: Felicie Morn, MD;  Location: [facility];  Service: General;  Laterality: N/A;  CORONARY ARTERY BYPASS GRAFT N/A 07/18/2020   Procedure: CORONARY ARTERY BYPASS GRAFTING (CABG), ON PUMP, TIMES THREE, USING ENDOSCOPICALLY HARVESTED RIGHT GREATER SAPHENOUS VEIN;  Surgeon: Alleen Borne, MD;  Location: [facility] OR;  Service: Open Heart Surgery;  Laterality: N/A;   CORONARY STENT INTERVENTION N/A 10/03/2020   Procedure: CORONARY STENT INTERVENTION;  Surgeon: Runell Gess, MD;  Location: [facility] INVASIVE CV LAB;  Service:  Cardiovascular;  Laterality: N/A;   CORONARY/GRAFT ACUTE MI REVASCULARIZATION N/A 07/18/2020   Procedure: Coronary/Graft Acute MI Revascularization;  Surgeon: [place], Peter M, MD;  Location: [facility] INVASIVE CV LAB;  Service: Cardiovascular;  Laterality: N/A;   INTRAVASCULAR PRESSURE WIRE/FFR STUDY N/A 10/03/2020   Procedure: INTRAVASCULAR PRESSURE WIRE/FFR STUDY;  Surgeon: Runell Gess, MD;  Location: [facility] INVASIVE CV LAB;  Service: Cardiovascular;  Laterality: N/A;   LEFT HEART CATH AND CORONARY ANGIOGRAPHY N/A 07/18/2020   Procedure: LEFT HEART CATH AND CORONARY ANGIOGRAPHY;  Surgeon: Tonny Bollman, MD;  Location: [facility] INVASIVE CV LAB;  Service: Cardiovascular;  Laterality: N/A;   LEFT HEART CATH AND CORS/GRAFTS ANGIOGRAPHY N/A 08/08/2020   Procedure: LEFT HEART CATH AND CORS/GRAFTS ANGIOGRAPHY;  Surgeon: Lennette Bihari, MD;  Location: [facility] INVASIVE CV LAB;  Service: Cardiovascular;  Laterality: N/A;   LEFT HEART CATH AND CORS/GRAFTS ANGIOGRAPHY N/A 10/03/2020   Procedure: LEFT HEART CATH AND CORS/GRAFTS ANGIOGRAPHY;  Surgeon: Runell Gess, MD;  Location: [facility] INVASIVE CV LAB;  Service: Cardiovascular;  Laterality: N/A;   TEE WITHOUT CARDIOVERSION N/A 07/18/2020   Procedure: TRANSESOPHAGEAL ECHOCARDIOGRAM (TEE);  Surgeon: Alleen Borne, MD;  Location: [facility] OR;  Service: Open Heart Surgery;  Laterality: N/A;   UMBILICAL HERNIA REPAIR  08/11/2022   Procedure: HERNIA REPAIR UMBILICAL ADULT;  Surgeon: Quentin Ore, MD;  Location: [facility] OR;  Service: General;;    Allergies  No Known Allergies   EKGs/Labs/Other Studies Reviewed:   The following studies were reviewed today: Ultrasound 10/18/20: Summary:  The avascular inhomogeneous structure noted in the right groin has  characteristics of a hematoma, measuring 6.7 cm in length and 4.4 cm in  width.    [place] PSA 10/04/20: A mixed echogenic structure measuring approximately 4.1 cm x 1.8 cm is  visualized at the right groin with ultrasound  characteristics of a  hematoma.    ____________________   Left heart cath 10/03/20: Origin lesion is 100% stenosed. Ost Cx to Prox Cx lesion is 80% stenosed. Ost LAD to Mid LAD lesion is 100% stenosed. Origin to Prox Graft lesion before 1st Diag is 95% stenosed. Origin to Prox Graft lesion between 1st Diag and 3rd Mrg is 100% stenosed. A drug-eluting stent was successfully placed using a STENT RESOLUTE ONYX 4.5X22. Post intervention, there is a 10% residual stenosis. The left ventricular ejection fraction is 50-55% by visual estimate. The left ventricular systolic function is normal. LV end diastolic pressure is normal.   IMPRESSION: Successful PCI drug-eluting stenting of the origin of the diagonal branch SVG with occlusion of the continuation of the diagonal to the obtuse marginal branch.  The DFR of the proximal native circumflex was 0.99 suggesting this was not physiologically significant.  There was dampening at the origin of the dominant RCA which had been demonstrated in several prior cardiac cath but no obstructive disease was noted.  The sheath was secured in place.  The patient was already on aspirin and ticagrelor.  The sheath will be removed once ACT falls below 170 pressure held.  Patient will be hydrated overnight, discharged home  in the morning and follow-up with Dr. Shari Prows.` _____________   Echo 08/08/20:  1. Septal hypokinesis consistent with post-operative state. Left  ventricular ejection fraction, by estimation, is 50 to 55%. The left  ventricle has low normal function. The left ventricle demonstrates  regional wall motion abnormalities (see scoring  diagram/findings for description). Left ventricular diastolic parameters  are consistent with Grade I diastolic dysfunction (impaired relaxation).   2. Right ventricular systolic function is normal. The right ventricular  size is normal.   3. The mitral valve is normal in structure. Trivial mitral valve  regurgitation.  No evidence of mitral stenosis.   4. The aortic valve is tricuspid. Aortic valve regurgitation is not  visualized. No aortic stenosis is present.   5. The inferior vena cava is normal in size with greater than 50%  respiratory variability, suggesting right atrial pressure of 3 mmHg.    Cath 08/09/19: Prox Cx lesion is 80% stenosed. Prox LAD to Mid LAD lesion is 100% stenosed. Mid LAD lesion is 80% stenosed. Origin to Prox Graft lesion is 100% stenosed. Ost RCA lesion is 40% stenosed. There is mild to moderate left ventricular systolic dysfunction. LV end diastolic pressure is mildly elevated.   Mild LV dysfunction with mild anterolateral hypocontractility, EF estimate at 40% with LVEDP at 20 mmHg.   Significant native CAD with total occlusion of the LAD immediately proximal to the takeoff of the diagonal vessel.   Normal bifurcating ramus intermediate vessel.   Large left circumflex coronary artery with 80% proximal stenosis and competitive filling of a large OM 3 vessel.   Smooth ostial narrowing of the RCA of approximately 30 to 40%.  However with catheter engagement there was significant catheter dampening which improved following IC nitroglycerin administration.   Occluded SVG which had supplied the LAD.   Patent sequential SVG supplying the diagonal vessel which then fills the LAD with previously noted 80% stenosis on an angle in the LAD immediately after the diagonal takeoff, and sequential graft supplying a large OM 3 vessel of the left circumflex coronary artery.   RECOMMENDATION: The patient's non-ST segment elevation myocardial infarction most likely is due to acute occlusion of the vein graft which had supplied the LAD.  This graft is 105 weeks old.  I suspect this occlusion may have contributed by the fact that the LAD is supplied by the diagonal vessel which is grafted and there is TIMI-3 flow down the LAD system.  There continues to be a high-grade 80+ percent stenosis on an  angle in the LAD arising from the diagonal vessel which 3 weeks ago was not able to be entered with a wire during attempted acute angioplasty.  Since the patient is pain-free and there is a high likelihood of thrombus in the vein graft which has occluded plan initial aggressive medical management.  Will initiate DAPT with aspirin/Brilinta.  Continue beta-blocker therapy, initiate nitrate therapy, and continue aggressive lipid-lowering therapy.  If patient continues to have symptoms, consider addition of ranolazine and possible future intervention to the native LAD after an aggressive medical therapy trial.    Diagnostic Dominance: Right      Intervention     TTE 08/08/20: IMPRESSIONS   1. Septal hypokinesis consistent with post-operative state. Left  ventricular ejection fraction, by estimation, is 50 to 55%. The left  ventricle has low normal function. The left ventricle demonstrates  regional wall motion abnormalities (see scoring  diagram/findings for description). Left ventricular diastolic parameters  are consistent with Grade I  diastolic dysfunction (impaired relaxation).   2. Right ventricular systolic function is normal. The right ventricular  size is normal.   3. The mitral valve is normal in structure. Trivial mitral valve  regurgitation. No evidence of mitral stenosis.   4. The aortic valve is tricuspid. Aortic valve regurgitation is not  visualized. No aortic stenosis is present.   5. The inferior vena cava is normal in size with greater than 50%  respiratory variability, suggesting right atrial pressure of 3 mmHg.    Cath 07/18/20:   1st Diag lesion is 90% stenosed. Prox Cx lesion is 75% stenosed. Prox LAD-1 lesion is 100% stenosed. Prox LAD-2 lesion is 95% stenosed with 95% stenosed side branch in 1st Diag. Post intervention, there is a 95% residual stenosis. Post intervention, there is a 95% residual stenosis. Post intervention, the side branch was reduced to 95%  residual stenosis.   1. Acute occlusion of the proximal LAD at site of very complex bifurcation lesion. Successful restoration of antegrade flow with POBA into the first diagonal branch. Unable to cross the lesion in the LAD distal to the diagonal with a wire.    Plan: emergent CABG.    Diagnostic Dominance: Right      Intervention          TTE 07/17/20: IMPRESSIONS   1. Left ventricular ejection fraction, by estimation, is 55 to 60%. The  left ventricle has normal function. The left ventricle has no regional  wall motion abnormalities. There is moderate asymmetric left ventricular  hypertrophy. Left ventricular  diastolic parameters are consistent with Grade I diastolic dysfunction  (impaired relaxation).   2. Right ventricular systolic function is normal. The right ventricular  size is normal.   3. The mitral valve is grossly normal. No evidence of mitral valve  regurgitation.   4. The aortic valve was not well visualized. Aortic valve regurgitation  is not visualized. No aortic stenosis is present.   Comparison(s): No prior Echocardiogram.   Conclusion(s)/Recommendation(s): Normal biventricular function without  evidence of hemodynamically significant valvular heart disease.   FINDINGS   Left Ventricle: Left ventricular ejection fraction, by estimation, is 55  to 60%. The left ventricle has normal function. The left ventricle has no  regional wall motion abnormalities. The left ventricular internal cavity  size was small. There is moderate   asymmetric left ventricular hypertrophy. Left ventricular diastolic  parameters are consistent with Grade I diastolic dysfunction (impaired  relaxation).   Right Ventricle: The right ventricular size is normal. No increase in  right ventricular wall thickness. Right ventricular systolic function is  normal.   Left Atrium: Left atrial size was normal in size.   Right Atrium: Right atrial size was normal in size.    Pericardium: There is no evidence of pericardial effusion.   Mitral Valve: The mitral valve is grossly normal. No evidence of mitral  valve regurgitation.   Tricuspid Valve: The tricuspid valve is grossly normal. Tricuspid valve  regurgitation is not demonstrated.   Aortic Valve: The aortic valve was not well visualized. Aortic valve  regurgitation is not visualized. No aortic stenosis is present.   Pulmonic Valve: The pulmonic valve was grossly normal. Pulmonic valve  regurgitation is not visualized.   Aorta: The aortic root and ascending aorta are structurally normal, with  no evidence of dilitation.   IAS/Shunts: The atrial septum is grossly normal.    07/18/20: CABG Op note: Procedure:   Emergent Median Sternotomy Extracorporeal circulation 3.   Coronary artery bypass  grafting x 3   Saphenous vein graft to the LAD Sequential SVG to diagonal and OM.     4.   Endoscopic vein harvest from the right leg  EKG:  EKG is not ordered today.   Recent Labs: 08/10/2022: ALT 191 08/12/2022: BUN 13; Creatinine, Ser 0.99; Hemoglobin 12.7; Platelets 216; Potassium 4.1; Sodium 136  Recent Lipid Panel    Component Value Date/Time   CHOL 96 (L) 11/10/2021 0825   TRIG 108 11/10/2021 0825   HDL 39 (L) 11/10/2021 0825   CHOLHDL 2.5 11/10/2021 0825   CHOLHDL 6.4 07/17/2020 1015   VLDL 31 07/17/2020 1015   LDLCALC 37 11/10/2021 0825    Home Medications   Current Meds  Medication Sig   aspirin EC 81 MG tablet Take 81 mg by mouth in the morning.   Cholecalciferol (VITAMIN D-3 PO) Take 1 capsule by mouth in the morning.   clopidogrel (PLAVIX) 75 MG tablet Take 1 tablet (75 mg total) by mouth daily. (Patient taking differently: Take 75 mg by mouth in the morning.)   famotidine (PEPCID) 20 MG tablet Take 1 tablet (20 mg total) by mouth daily. (Patient taking differently: Take 20 mg by mouth in the morning.)   FERROUS SULFATE PO Take 65 mg by mouth daily.   metoprolol succinate  (TOPROL-XL) 25 MG 24 hr tablet Take 0.5 tablets (12.5 mg total) by mouth daily. (Patient taking differently: Take 12.5 mg by mouth in the morning.)   Multiple Vitamins-Minerals (CENTRUM ADULT PO) Take by mouth daily.   nitroGLYCERIN (NITROSTAT) 0.4 MG SL tablet PLACE 1 TABLET (0.4 MG TOTAL) UNDER THE TONGUE EVERY 5 (FIVE) MINUTES X 3 DOSES AS NEEDED FOR CHEST PAIN. (Patient taking differently: Place 0.4 mg under the tongue every 5 (five) minutes x 3 doses as needed for chest pain.)   ondansetron (ZOFRAN) 4 MG tablet Take 1 tablet (4 mg total) by mouth every 6 (six) hours.   oxyCODONE (OXY IR/ROXICODONE) 5 MG immediate release tablet Take 1 tablet (5 mg total) by mouth every 6 (six) hours as needed for moderate pain.   TART CHERRY PO Take 1 capsule by mouth at bedtime.   [DISCONTINUED] alum & mag hydroxide-simeth (MAALOX MAX) 400-400-40 MG/5ML suspension Take 15 mLs by mouth every 6 (six) hours as needed for indigestion.   [DISCONTINUED] atorvastatin (LIPITOR) 80 MG tablet Take 1 tablet (80 mg total) by mouth daily. (Patient taking differently: Take 80 mg by mouth in the morning.)   [DISCONTINUED] ezetimibe (ZETIA) 10 MG tablet Take 1 tablet (10 mg total) by mouth daily. (Patient taking differently: Take 10 mg by mouth in the morning.)   [DISCONTINUED] ferrous fumarate-b12-vitamic C-folic acid (FEROCON) capsule Take 1 capsule by mouth 2 (two) times daily.     Review of Systems      All other systems reviewed and are otherwise negative except as noted above.  Physical Exam    VS:  BP 135/80   Pulse 65   Ht 4' 11.5" (1.511 m)   Wt 147 lb (66.7 kg)   SpO2 99%   BMI 29.19 kg/m  , BMI Body mass index is 29.19 kg/m.  Wt Readings from Last 3 Encounters:  08/17/22 147 lb (66.7 kg)  08/11/22 147 lb 14.9 oz (67.1 kg)  08/05/22 148 lb (67.1 kg)     GEN: Well nourished, well developed, in no acute distress. HEENT: normal. Neck: Supple, no JVD, carotid bruits, or masses. Cardiac: RRR, no  murmurs, rubs, or gallops. No clubbing, cyanosis,  edema.  Radials/PT 2+ and equal bilaterally.  Respiratory:  Respirations regular and unlabored, clear to auscultation bilaterally. GI: Soft, nontender, nondistended. MS: No deformity or atrophy. Skin: Warm and dry, no rash. Neuro:  Strength and sensation are intact. Psych: Normal affect.  Assessment & Plan    Multivessel CAD status post CABG 07/18/2020 -plavix and asa -no chest pain -continue current medications lipitor 80mg  daily, zetia 10mg  daily, nitro as needed, and metoprolol succinate 12.5 mg daily  History of NSTEMI status post PCI to SVG-D1 graft -plavix and asa continue -no issues with bleeding  Hyperlipidemia -LDL 37 -lipid panel due 4/24  Hypertension -BP well controlled today -continue current medications         Disposition: Follow up 3-4 months with Freada Bergeron, MD or APP.  Signed, Elgie Collard, PA-C 08/17/2022, 3:29 PM [facility]

## 2022-08-17 ENCOUNTER — Encounter: Payer: Self-pay | Admitting: Physician Assistant

## 2022-08-17 ENCOUNTER — Ambulatory Visit: Payer: Medicare HMO | Attending: Physician Assistant | Admitting: Physician Assistant

## 2022-08-17 VITALS — BP 135/80 | HR 65 | Ht 59.5 in | Wt 147.0 lb

## 2022-08-17 DIAGNOSIS — Z951 Presence of aortocoronary bypass graft: Secondary | ICD-10-CM | POA: Diagnosis not present

## 2022-08-17 DIAGNOSIS — E785 Hyperlipidemia, unspecified: Secondary | ICD-10-CM | POA: Diagnosis not present

## 2022-08-17 DIAGNOSIS — I10 Essential (primary) hypertension: Secondary | ICD-10-CM

## 2022-08-17 DIAGNOSIS — I25118 Atherosclerotic heart disease of native coronary artery with other forms of angina pectoris: Secondary | ICD-10-CM

## 2022-08-17 DIAGNOSIS — I214 Non-ST elevation (NSTEMI) myocardial infarction: Secondary | ICD-10-CM

## 2022-08-17 DIAGNOSIS — Z79899 Other long term (current) drug therapy: Secondary | ICD-10-CM

## 2022-08-17 DIAGNOSIS — Z95820 Peripheral vascular angioplasty status with implants and grafts: Secondary | ICD-10-CM | POA: Diagnosis not present

## 2022-08-17 MED ORDER — ATORVASTATIN CALCIUM 80 MG PO TABS: 80.0000 mg | ORAL_TABLET | Freq: Every day | ORAL | 1 refills | Status: DC

## 2022-08-17 MED ORDER — EZETIMIBE 10 MG PO TABS: 10.0000 mg | ORAL_TABLET | Freq: Every day | ORAL | 1 refills | Status: DC

## 2022-08-17 NOTE — Patient Instructions (Signed)
Medication Instructions:  Your physician recommends that you continue on your current medications as directed. Please refer to the Current Medication list given to you today.  *If you need a refill on your cardiac medications before your next appointment, please call your pharmacy*   Lab Work: None ordered If you have labs (blood work) drawn today and your tests are completely normal, you will receive your results only by: [employer] (if you have MyChart) OR A paper copy in the mail If you have any lab test that is abnormal or we need to change your treatment, we will call you to review the results.   Follow-Up: At [facility], you and your health needs are our priority.  As part of our continuing mission to provide you with exceptional heart care, we have created designated Provider Care Teams.  These Care Teams include your primary Cardiologist (physician) and Advanced Practice Providers (APPs -  Physician Assistants and Nurse Practitioners) who all work together to provide you with the care you need, when you need it.   Your next appointment:   3-4 month(s)  Provider:   Freada Bergeron, MD

## 2022-09-21 ENCOUNTER — Ambulatory Visit (HOSPITAL_BASED_OUTPATIENT_CLINIC_OR_DEPARTMENT_OTHER): Payer: Medicare HMO | Admitting: Family Medicine

## 2022-10-17 ENCOUNTER — Ambulatory Visit (INDEPENDENT_AMBULATORY_CARE_PROVIDER_SITE_OTHER): Payer: Medicare HMO | Admitting: Family Medicine

## 2022-10-17 ENCOUNTER — Encounter (HOSPITAL_BASED_OUTPATIENT_CLINIC_OR_DEPARTMENT_OTHER): Payer: Self-pay | Admitting: Family Medicine

## 2022-10-17 VITALS — BP 134/67 | HR 61 | Temp 97.7°F | Ht 59.5 in | Wt 146.6 lb

## 2022-10-17 DIAGNOSIS — I10 Essential (primary) hypertension: Secondary | ICD-10-CM

## 2022-10-17 NOTE — Progress Notes (Signed)
New Patient Office Visit  Subjective    Patient ID: Brenda Wilkins, female    DOB: 1945-08-25  Age: 77 y.o. MRN: [id number]  CC: New patient to establish care  HPI Brenda Wilkins presents to establish care Last PCP - has been at least 6-7 years.  CAD/NSTEMI/hypertension: Patient does follow with heart care, last visit 2 months ago.  Patient had CABG in December 2021 with subsequent NSTEMI's in January and February 2022.  She was found to have multiple occluded grafts status post PCI.  Current medications include ASA 81, atorvastatin, clopidogrel, ezetimibe, metoprolol.  She denies any symptoms at this time, no chest pain, no shortness of breath.  She is scheduled to follow-up with cardiology within the next couple months.  Patient is originally from [place]. Has lived here for about 2 years. She recently retired. She enjoys spending time with family, cooking, volunteering with polling.  Outpatient Encounter Medications as of 10/17/2022  Medication Sig   aspirin EC 81 MG tablet Take 81 mg by mouth in the morning.   atorvastatin (LIPITOR) 80 MG tablet Take 1 tablet (80 mg total) by mouth daily.   Cholecalciferol (VITAMIN D-3 PO) Take 1 capsule by mouth in the morning.   clopidogrel (PLAVIX) 75 MG tablet Take 1 tablet (75 mg total) by mouth daily. (Patient taking differently: Take 75 mg by mouth in the morning.)   ezetimibe (ZETIA) 10 MG tablet Take 1 tablet (10 mg total) by mouth daily.   metoprolol succinate (TOPROL-XL) 25 MG 24 hr tablet Take 0.5 tablets (12.5 mg total) by mouth daily. (Patient taking differently: Take 12.5 mg by mouth in the morning.)   Multiple Vitamins-Minerals (CENTRUM ADULT PO) Take by mouth daily.   nitroGLYCERIN (NITROSTAT) 0.4 MG SL tablet PLACE 1 TABLET (0.4 MG TOTAL) UNDER THE TONGUE EVERY 5 (FIVE) MINUTES X 3 DOSES AS NEEDED FOR CHEST PAIN. (Patient taking differently: Place 0.4 mg under the tongue every 5 (five) minutes x 3 doses as needed for chest pain.)   TART  CHERRY PO Take 1 capsule by mouth at bedtime.   [DISCONTINUED] famotidine (PEPCID) 20 MG tablet Take 1 tablet (20 mg total) by mouth daily. (Patient taking differently: Take 20 mg by mouth in the morning.)   [DISCONTINUED] FERROUS SULFATE PO Take 65 mg by mouth daily.   [DISCONTINUED] ondansetron (ZOFRAN) 4 MG tablet Take 1 tablet (4 mg total) by mouth every 6 (six) hours.   [DISCONTINUED] oxyCODONE (OXY IR/ROXICODONE) 5 MG immediate release tablet Take 1 tablet (5 mg total) by mouth every 6 (six) hours as needed for moderate pain.   No facility-administered encounter medications on file as of 10/17/2022.    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    Past Surgical History:  Procedure Laterality Date   CARDIAC CATHETERIZATION  08/08/2020   CHOLECYSTECTOMY N/A 08/11/2022   Procedure: LAPAROSCOPIC CHOLECYSTECTOMY;  Surgeon: Quentin Ore, MD;  Location: [facility] OR;  Service: General;  Laterality: N/A;   CORONARY ARTERY BYPASS GRAFT N/A 07/18/2020   Procedure: CORONARY ARTERY BYPASS GRAFTING (CABG), ON PUMP, TIMES THREE, USING ENDOSCOPICALLY HARVESTED RIGHT GREATER SAPHENOUS VEIN;  Surgeon: Alleen Borne, MD;  Location: [facility] OR;  Service: Open Heart Surgery;  Laterality: N/A;   CORONARY PRESSURE/FFR STUDY N/A 10/03/2020   Procedure: INTRAVASCULAR PRESSURE WIRE/FFR STUDY;  Surgeon: Runell Gess, MD;  Location: [facility] INVASIVE CV LAB;  Service: Cardiovascular;  Laterality: N/A;   CORONARY STENT INTERVENTION N/A 10/03/2020   Procedure: CORONARY STENT INTERVENTION;  Surgeon: Runell Gess, MD;  Location: [facility]AS[facility];  Service: Cardiovascular;  Laterality: N/A;   CORONARY/GRAFT ACUTE MI REVASCULARIZATION N/A 07/18/2020   Procedure: Coronary/Graft Acute MI Revascularization;  Surgeon: [place], Peter M, MD;  Location: [facility] INVASIVE CV LAB;  Service:  Cardiovascular;  Laterality: N/A;   LEFT HEART CATH AND CORONARY ANGIOGRAPHY N/A 07/18/2020   Procedure: LEFT HEART CATH AND CORONARY ANGIOGRAPHY;  Surgeon: Tonny Bollman, MD;  Location: [facility] INVASIVE CV LAB;  Service: Cardiovascular;  Laterality: N/A;   LEFT HEART CATH AND CORS/GRAFTS ANGIOGRAPHY N/A 08/08/2020   Procedure: LEFT HEART CATH AND CORS/GRAFTS ANGIOGRAPHY;  Surgeon: Lennette Bihari, MD;  Location: [facility] IN[facility] CV [facility];  Service: Cardiovascular;  Laterality: N/A;   LEFT HEART CATH AND CORS/GRAFTS ANGIOGRAPHY N/A 10/03/2020   Procedure: LEFT HEART CATH AND CORS/GRAFTS ANGIOGRAPHY;  Surgeon: Runell Gess, MD;  Location: [facility] INVAS[facility] CV [facility];  Service: Cardiovascular;  Laterality: N/A;   TEE WITHOUT CARDIOVERSION N/A 07/18/2020   Procedure: TRANSESOPHAGEAL ECHOCARDIOGRAM (TEE);  Surgeon: Alleen Borne, MD;  Location: [facility] OR;  Service: Open Heart Surgery;  Laterality: N/A;   UMBILICAL HERNIA REPAIR  08/11/2022   Procedure: HERNIA REPAIR UMBILICAL ADULT;  Surgeon: Quentin Ore, MD;  Location: [facility] OR;  Service: General;;    No family history on file.  Social History   Socioeconomic History   Marital status: Widowed    Spouse name: Not on file   Number of children: Not on file   Years of education: 12   Highest education level: Not on file  Occupational History   Not on file  Tobacco Use   Smoking status: Never   Smokeless tobacco: Never  Vaping Use   Vaping Use: Never used  Substance and Sexual Activity   Alcohol use: Never   Drug use: Never   Sexual activity: Not Currently  Other Topics Concern   Not on file  Social History Narrative   Not on file   Social Determinants of Health   Financial Resource Strain: Low Risk  (11/27/2022)   Overall Financial Resource Strain (CARDIA)    Difficulty of Paying Living Expenses: Not hard at all  Food Insecurity: No Food Insecurity (11/27/2022)   Hunger Vital Sign    Worried About Running Out of Food in the Last Year: Never  true    Ran Out of Food in the Last Year: Never true  Transportation Needs: No Transportation Needs (11/27/2022)   PRAPARE - Administrator, Civil Service (Medical): No    Lack of Transportation (Non-Medical): No  Physical Activity: Sufficiently Active (11/27/2022)   Exercise Vital Sign    Days of Exercise per Week: 5 days    Minutes of Exercise per Session: 60 min  Stress: No Stress Concern Present (11/27/2022)   [employer] of Occupational Health - Occupational Stress Questionnaire    Feeling of Stress : Not at all  Social Connections: Not on file  Intimate Partner Violence: Not At Risk (11/27/2022)   Humiliation, Afraid, Rape, and Kick questionnaire    Fear of Current or Ex-Partner: No    Emotionally Abused: No    Physically Abused: No    Sexually Abused: No    Objective    BP 134/67 (BP Location: Left Arm, Patient Position: Sitting, Cuff Size: Normal)   Pulse 61   Temp  97.7 F (36.5 C) (Oral)   Ht 4' 11.5" (1.511 m)   Wt 146 lb 9.6 oz (66.5 kg)   SpO2 100%   BMI 29.11 kg/m   Physical Exam  77 year old female in no acute distress Cardiovascular exam with regular rate and rhythm Lungs clear to auscultation bilaterally  Assessment & Plan:   Problem List Items Addressed This Visit       Cardiovascular and Mediastinum   HTN (hypertension) - Primary    Blood pressure appropriate in office, systolic is borderline, diastolic blood pressure is at goal.  Recommend continuing with current regimen, no change to medications today Recommend regular follow-up with cardiology as scheduled Recommend intermittent monitoring of blood pressure at home, DASH diet       Return in about 6 months (around 04/19/2023) for follow-up.   Erum Cercone J De [place], MD

## 2022-11-05 ENCOUNTER — Ambulatory Visit (HOSPITAL_COMMUNITY)
Admission: EM | Admit: 2022-11-05 | Discharge: 2022-11-05 | Disposition: A | Discharge status: Home / Self Care | Payer: Medicare HMO

## 2022-11-22 NOTE — Progress Notes (Unsigned)
Cardiology Office Note:    Date:  03/01/2022   ID:  Brenda Wilkins, DOB [birth year], MRN [id number]  PCP:  Patient, No Pcp Per   [facility]  Cardiologist:  Meriam Sprague, MD  Advanced Practice Provider:  No care team member to display Electrophysiologist:  None   Referring MD: No ref. provider found     History of Present Illness:    Brenda Wilkins is a 77 y.o. female with a hx of with a hx of known CAD s/p CABG 08/05/20 with subsequent NSTEMIs (08/07/20 and 09/29/20) found to have multiple occluded grafts s/p PCI to SVG-Diag as detailed below and HLD who presents to clinic for follow-up.   The patient suffered from an NSTEMI in 07/2021 found to have multivessel CAD on cath 07/17/20 s/p 3V CABG on 07/18/20 (SVG to LAD, sequential SVG to Diag and OM).     Post-operative course was complicated by recurrent chest pain causing her to re-present to the ED on 08/07/20. In the ED, she was found to have elevated troponin consistent with NSTEMI. She underwent repeat cath, which revealed occluded SVG-LAD graft but TIMI 3 flow down LAD from SVG-Diag graft. Case was discussed with CV surgery and given brisk TIMI 3 flow down the LAD from the other bypass graft, the decision was made to manage medically.    She was doing well until 09/29/20 where she developed progressive chest pressure. She presented to [facility] on 10/02/20 and underwent coronary angiography where she had 95% stenosis of the graft before the 1st diag and 100% stenosis of the origin of the graft between the first Diag and OM2. A DES was placed at the origin of the diagonal branch SVG. Her post-cath course was complicated by a right groin hematoma and pseudoaneurysm. She was managed conservatively.   Called the office prior to her visit on 10/17/20 as she noted increased swelling in her groin with a palpable firm mass at prior groin access site. CBC was obtained which showed hemoglobin 12.6 which was improved from 10.3  on discharge. Ultrasound consistent with hematoma but no evidence of pseudoaneurysm. During her visit, she was stable and tolerating her medications. Groin site with sizable firm hematoma which she had been monitoring and was not increasing.   Was last seen in clinic on 08/2022 by Jari Favre. Had undergoing cholecystectomy which she tolerated well. No CV symptoms at that visit.  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    Past Surgical History:  Procedure Laterality Date   CARDIAC CATHETERIZATION  08/08/2020   CORONARY ARTERY BYPASS GRAFT N/A 07/18/2020   Procedure: CORONARY ARTERY BYPASS GRAFTING (CABG), ON PUMP, TIMES THREE, USING ENDOSCOPICALLY HARVESTED RIGHT GREATER SAPHENOUS VEIN;  Surgeon: Alleen Borne, MD;  Location: [facility] OR;  Service: Open Heart Surgery;  Laterality: N/A;   CORONARY STENT INTERVENTION N/A 10/03/2020   Procedure: CORONARY STENT INTERVENTION;  Surgeon: Runell Gess, MD;  Location: [facility] INVASIVE CV LAB;  Service: Cardiovascular;  Laterality: N/A;   CORONARY/GRAFT ACUTE MI REVASCULARIZATION N/A 07/18/2020   Procedure: Coronary/Graft Acute MI Revascularization;  Surgeon: [place], Peter M, MD;  Location: [facility] INVASIVE CV LAB;  Service: Cardiovascular;  Laterality: N/A;   INTRAVASCULAR PRESSURE WIRE/FFR STUDY N/A 10/03/2020   Procedure: INTRAVASCULAR PRESSURE WIRE/FFR STUDY;  Surgeon: Runell Gess, MD;  Location: [facility] INVASIVE CV LAB;  Service: Cardiovascular;  Laterality: N/A;   LEFT HEART CATH AND CORONARY ANGIOGRAPHY N/A 07/18/2020   Procedure: LEFT HEART CATH AND CORONARY ANGIOGRAPHY;  Surgeon: Tonny Bollman, MD;  Location: [facility] INVASIVE CV LAB;  Service: Cardiovascular;  Laterality: N/A;   LEFT HEART CATH AND CORS/GRAFTS ANGIOGRAPHY N/A 08/08/2020   Procedure: LEFT HEART CATH AND CORS/GRAFTS ANGIOGRAPHY;   Surgeon: Lennette Bihari, MD;  Location: [facility] INVASIVE CV LAB;  Service: Cardiovascular;  Laterality: N/A;   LEFT HEART CATH AND CORS/GRAFTS ANGIOGRAPHY N/A 10/03/2020   Procedure: LEFT HEART CATH AND CORS/GRAFTS ANGIOGRAPHY;  Surgeon: Runell Gess, MD;  Location: [facility] INVASIVE CV LAB;  Service: Cardiovascular;  Laterality: N/A;   TEE WITHOUT CARDIOVERSION N/A 07/18/2020   Procedure: TRANSESOPHAGEAL ECHOCARDIOGRAM (TEE);  Surgeon: Alleen Borne, MD;  Location: [facility] OR;  Service: Open Heart Surgery;  Laterality: N/A;    Current Medications: Current Meds  Medication Sig   acetaminophen (TYLENOL) 325 MG tablet Take 325-650 mg by mouth every 6 (six) hours as needed for mild pain (or headaches).   aspirin 81 MG EC tablet Take 1 tablet (81 mg total) by mouth daily. Swallow whole.   atorvastatin (LIPITOR) 80 MG tablet Take 1 tablet (80 mg total) by mouth daily.   Cholecalciferol (VITAMIN D) 50 MCG (2000 UT) CAPS Take 1 capsule by mouth daily.   clopidogrel (PLAVIX) 75 MG tablet Take 1 tablet (75 mg total) by mouth daily.   colchicine 0.6 MG tablet Take 1 tablet (0.6 mg total) by mouth as needed (As needed for gout flare).   ezetimibe (ZETIA) 10 MG tablet Take 1 tablet (10 mg total) by mouth daily.   ferrous fumarate-b12-vitamic C-folic acid (FEROCON) capsule Take 1 capsule by mouth 2 (two) times daily.   metoCLOPramide (REGLAN) 10 MG tablet Take 1 tablet (10 mg total) by mouth every 6 (six) hours as needed for nausea (nausea/headache).   metoprolol succinate (TOPROL-XL) 25 MG 24 hr tablet Take 0.5 tablets (12.5 mg total) by mouth daily.   nitroGLYCERIN (NITROSTAT) 0.4 MG SL tablet PLACE 1 TABLET (0.4 MG TOTAL) UNDER THE TONGUE EVERY 5 (FIVE) MINUTES X 3 DOSES AS NEEDED FOR CHEST PAIN.   oxyCODONE (ROXICODONE) 5 MG immediate release tablet Take 0.5-1 tablets (2.5-5 mg total) by mouth every 6 (six) hours as needed for severe pain.   TART CHERRY PO Take 1 capsule by mouth daily.   [DISCONTINUED]  ticagrelor (BRILINTA) 90 MG TABS tablet Take 1 tablet (90 mg total) by mouth 2 (two) times daily.     Allergies:   Patient has no known allergies.   Social History   Socioeconomic History   Marital status: Widowed    Spouse name: Not on file   Number of children: Not on file   Years of education: 12   Highest education level: Not on file  Occupational History   Not on file  Tobacco Use   Smoking status: Never   Smokeless tobacco: Never  Vaping Use   Vaping Use: Never used  Substance and Sexual Activity   Alcohol use: Never   Drug use: Never   Sexual activity: Not Currently  Other Topics Concern   Not on file  Social History Narrative   Not on file   Social Determinants of Health   Financial Resource Strain: Not on file  Food Insecurity: Not on file  Transportation Needs: Not on file  Physical Activity: Not on file  Stress: Not on file  Social  Connections: Not on file     Family History: The patient's family history is not on file.  ROS:   Please see the history of present illness. All other systems reviewed and are negative. Review of Systems  Constitutional: Negative for unexpected weight change.  HENT: Negative for hearing loss and rhinorrhea.   Eyes: Negative for visual disturbance.  Respiratory: Negative for cough.   Cardiovascular: Negative for chest pain and leg swelling.  Gastrointestinal: Negative for nausea, vomiting, diarrhea and blood in stool.  Genitourinary: Negative for dysuria and frequency.  Musculoskeletal: Negative for myalgias and arthralgias.  Skin: Negative for rash.  Neurological: Negative for headaches.  Hematological: Negative for adenopathy.  Psychiatric/Behavioral: Denies depression or anxiety    EKGs/Labs/Other Studies Reviewed:    The following studies were reviewed today: Ultrasound 10/18/20: Summary:  The avascular inhomogeneous structure noted in the right groin has  characteristics of a hematoma, measuring 6.7 cm in  length and 4.4 cm in  width.    [place] PSA 10/04/20: A mixed echogenic structure measuring approximately 4.1 cm x 1.8 cm is  visualized at the right groin with ultrasound characteristics of a  hematoma.    ____________________   Left heart cath 10/03/20: Origin lesion is 100% stenosed. Ost Cx to Prox Cx lesion is 80% stenosed. Ost LAD to Mid LAD lesion is 100% stenosed. Origin to Prox Graft lesion before 1st Diag is 95% stenosed. Origin to Prox Graft lesion between 1st Diag and 3rd Mrg is 100% stenosed. A drug-eluting stent was successfully placed using a STENT RESOLUTE ONYX 4.5X22. Post intervention, there is a 10% residual stenosis. The left ventricular ejection fraction is 50-55% by visual estimate. The left ventricular systolic function is normal. LV end diastolic pressure is normal.   IMPRESSION: Successful PCI drug-eluting stenting of the origin of the diagonal branch SVG with occlusion of the continuation of the diagonal to the obtuse marginal branch.  The DFR of the proximal native circumflex was 0.99 suggesting this was not physiologically significant.  There was dampening at the origin of the dominant RCA which had been demonstrated in several prior cardiac cath but no obstructive disease was noted.  The sheath was secured in place.  The patient was already on aspirin and ticagrelor.  The sheath will be removed once ACT falls below 170 pressure held.  Patient will be hydrated overnight, discharged home in the morning and follow-up with Dr. Shari Prows.` _____________   Echo 08/08/20:  1. Septal hypokinesis consistent with post-operative state. Left  ventricular ejection fraction, by estimation, is 50 to 55%. The left  ventricle has low normal function. The left ventricle demonstrates  regional wall motion abnormalities (see scoring  diagram/findings for description). Left ventricular diastolic parameters  are consistent with Grade I diastolic dysfunction (impaired relaxation).   2.  Right ventricular systolic function is normal. The right ventricular  size is normal.   3. The mitral valve is normal in structure. Trivial mitral valve  regurgitation. No evidence of mitral stenosis.   4. The aortic valve is tricuspid. Aortic valve regurgitation is not  visualized. No aortic stenosis is present.   5. The inferior vena cava is normal in size with greater than 50%  respiratory variability, suggesting right atrial pressure of 3 mmHg.    Cath 08/09/19: Prox Cx lesion is 80% stenosed. Prox LAD to Mid LAD lesion is 100% stenosed. Mid LAD lesion is 80% stenosed. Origin to Prox Graft lesion is 100% stenosed. Ost RCA lesion is 40% stenosed. There is  mild to moderate left ventricular systolic dysfunction. LV end diastolic pressure is mildly elevated.   Mild LV dysfunction with mild anterolateral hypocontractility, EF estimate at 40% with LVEDP at 20 mmHg.   Significant native CAD with total occlusion of the LAD immediately proximal to the takeoff of the diagonal vessel.   Normal bifurcating ramus intermediate vessel.   Large left circumflex coronary artery with 80% proximal stenosis and competitive filling of a large OM 3 vessel.   Smooth ostial narrowing of the RCA of approximately 30 to 40%.  However with catheter engagement there was significant catheter dampening which improved following IC nitroglycerin administration.   Occluded SVG which had supplied the LAD.   Patent sequential SVG supplying the diagonal vessel which then fills the LAD with previously noted 80% stenosis on an angle in the LAD immediately after the diagonal takeoff, and sequential graft supplying a large OM 3 vessel of the left circumflex coronary artery.   RECOMMENDATION: The patient's non-ST segment elevation myocardial infarction most likely is due to acute occlusion of the vein graft which had supplied the LAD.  This graft is 21 weeks old.  I suspect this occlusion may have contributed by the fact  that the LAD is supplied by the diagonal vessel which is grafted and there is TIMI-3 flow down the LAD system.  There continues to be a high-grade 80+ percent stenosis on an angle in the LAD arising from the diagonal vessel which 3 weeks ago was not able to be entered with a wire during attempted acute angioplasty.  Since the patient is pain-free and there is a high likelihood of thrombus in the vein graft which has occluded plan initial aggressive medical management.  Will initiate DAPT with aspirin/Brilinta.  Continue beta-blocker therapy, initiate nitrate therapy, and continue aggressive lipid-lowering therapy.  If patient continues to have symptoms, consider addition of ranolazine and possible future intervention to the native LAD after an aggressive medical therapy trial.    Diagnostic Dominance: Right      Intervention     TTE 08/08/20: IMPRESSIONS   1. Septal hypokinesis consistent with post-operative state. Left  ventricular ejection fraction, by estimation, is 50 to 55%. The left  ventricle has low normal function. The left ventricle demonstrates  regional wall motion abnormalities (see scoring  diagram/findings for description). Left ventricular diastolic parameters  are consistent with Grade I diastolic dysfunction (impaired relaxation).   2. Right ventricular systolic function is normal. The right ventricular  size is normal.   3. The mitral valve is normal in structure. Trivial mitral valve  regurgitation. No evidence of mitral stenosis.   4. The aortic valve is tricuspid. Aortic valve regurgitation is not  visualized. No aortic stenosis is present.   5. The inferior vena cava is normal in size with greater than 50%  respiratory variability, suggesting right atrial pressure of 3 mmHg.    Cath 07/18/20:   1st Diag lesion is 90% stenosed. Prox Cx lesion is 75% stenosed. Prox LAD-1 lesion is 100% stenosed. Prox LAD-2 lesion is 95% stenosed with 95% stenosed side branch in 1st  Diag. Post intervention, there is a 95% residual stenosis. Post intervention, there is a 95% residual stenosis. Post intervention, the side branch was reduced to 95% residual stenosis.   1. Acute occlusion of the proximal LAD at site of very complex bifurcation lesion. Successful restoration of antegrade flow with POBA into the first diagonal branch. Unable to cross the lesion in the LAD distal to the diagonal with  a wire.    Plan: emergent CABG.    Diagnostic Dominance: Right      Intervention          TTE 07/17/20: IMPRESSIONS   1. Left ventricular ejection fraction, by estimation, is 55 to 60%. The  left ventricle has normal function. The left ventricle has no regional  wall motion abnormalities. There is moderate asymmetric left ventricular  hypertrophy. Left ventricular  diastolic parameters are consistent with Grade I diastolic dysfunction  (impaired relaxation).   2. Right ventricular systolic function is normal. The right ventricular  size is normal.   3. The mitral valve is grossly normal. No evidence of mitral valve  regurgitation.   4. The aortic valve was not well visualized. Aortic valve regurgitation  is not visualized. No aortic stenosis is present.   Comparison(s): No prior Echocardiogram.   Conclusion(s)/Recommendation(s): Normal biventricular function without  evidence of hemodynamically significant valvular heart disease.   FINDINGS   Left Ventricle: Left ventricular ejection fraction, by estimation, is 55  to 60%. The left ventricle has normal function. The left ventricle has no  regional wall motion abnormalities. The left ventricular internal cavity  size was small. There is moderate   asymmetric left ventricular hypertrophy. Left ventricular diastolic  parameters are consistent with Grade I diastolic dysfunction (impaired  relaxation).   Right Ventricle: The right ventricular size is normal. No increase in  right ventricular wall thickness.  Right ventricular systolic function is  normal.   Left Atrium: Left atrial size was normal in size.   Right Atrium: Right atrial size was normal in size.   Pericardium: There is no evidence of pericardial effusion.   Mitral Valve: The mitral valve is grossly normal. No evidence of mitral  valve regurgitation.   Tricuspid Valve: The tricuspid valve is grossly normal. Tricuspid valve  regurgitation is not demonstrated.   Aortic Valve: The aortic valve was not well visualized. Aortic valve  regurgitation is not visualized. No aortic stenosis is present.   Pulmonic Valve: The pulmonic valve was grossly normal. Pulmonic valve  regurgitation is not visualized.   Aorta: The aortic root and ascending aorta are structurally normal, with  no evidence of dilitation.   IAS/Shunts: The atrial septum is grossly normal.    07/18/20: CABG Op note: Procedure:   Emergent Median Sternotomy Extracorporeal circulation 3.   Coronary artery bypass grafting x 3   Saphenous vein graft to the LAD Sequential SVG to diagonal and OM.     4.   Endoscopic vein harvest from the right leg  EKG:  EKG was not ordered today.    Recent Labs: 05/07/2021: BUN 17; Creatinine, Ser 0.93; Hemoglobin 14.7; Platelets 197; Potassium 4.5; Sodium 138 09/08/2021: ALT 31  Recent Lipid Panel    Component Value Date/Time   CHOL 96 (L) 11/10/2021 0825   TRIG 108 11/10/2021 0825   HDL 39 (L) 11/10/2021 0825   CHOLHDL 2.5 11/10/2021 0825   CHOLHDL 6.4 07/17/2020 1015   VLDL 31 07/17/2020 1015   LDLCALC 37 11/10/2021 0825      Physical Exam:    VS:  BP 130/82   Pulse 72   Ht 4' 11.5" (1.511 m)   Wt 145 lb 6.4 oz (66 kg)   SpO2 98%   BMI 28.88 kg/m     Wt Readings from Last 3 Encounters:  03/01/22 145 lb 6.4 oz (66 kg)  08/31/21 143 lb 12.8 oz (65.2 kg)  04/05/21 142 lb (64.4 kg)  GEN: Well nourished, well developed in no acute distress HEENT: Normal NECK: No JVD; No carotid bruits LYMPHATICS: No  lymphadenopathy CARDIAC: RRR, no murmurs, rubs, gallops RESPIRATORY:  Clear to auscultation without rales, wheezing or rhonchi  ABDOMEN: Soft, non-tender, non-distended MUSCULOSKELETAL:  No edema; No deformity  SKIN: Warm and dry NEUROLOGIC:  Alert and oriented x 3 PSYCHIATRIC:  Normal affect   ASSESSMENT:    1. Coronary artery disease of native artery of native heart with stable angina pectoris (HCC)   2. S/P CABG x 3   3. Hyperlipidemia LDL goal <70   4. NSTEMI (non-ST elevated myocardial infarction) (HCC)   5. S/P angioplasty with stent      PLAN:    In order of problems listed above:  #Multivessel CAD s/p CABG on 07/18/20 with SVG to LAD, sequential SVG to Diag and OM #History of NSTEMI s/p PCI to SVG-D1 Graft: Patient with initial admission for NSTEMI found to have multivessel CAD on cath on 07/17/20 prompting emergent CABG on 07/18/20 as detailed above. LIMA deemed not a suitable bypass conduit due to small size. TTE on 07/17/20 with preserved LVEF 55-60%, no WMA, normal RV function. Post-operatively, she required inotropic support initially but was weaned off and was able to go home on 07/25/20. She was doing well until 01/02 when she developed acute onset back pain radiating to her chest. She presented to the ED where trop up-trended from 30-->500 and ECG with new TWI in the lateral leads (chronic q waves in V1-V3). Underwent coronary angiography which revealed occluded SVG-LAD graft. Suspect occlusion may have contributed by the fact that the LAD is supplied by the diagonal vessel which is grafted and there is TIMI-3 flow down the LAD system. Given concern for significant thrombus in the SVG-LAD graft and TIMI 3 flow down the LAD from SVG-Diag graft, the decision was made to pursue medical management. She was doing well until 09/29/20 when she developed progressive angina that required readmission on 10/02/20. Underwent coronary angiography on 10/03/20 where the SVG-D1 graft was 95%  stenosed proximally and the jump graft to the OM3 was 100% occluded. She ultimately underwent PCI to the SVG-D1. Now presenting for follow-up. -Continue plavix 75mg  daily and ASA 81mg  daily -Did not tolerate imdur due to hypotension and no active chest pain; can consider ranexa if angina returns -Continue atorvastatin 80mg  daily -Continue metop 25mg  XL daily -Continue cardiac rehab   #HLD: -Continue atorvastatin 80mg  daily -LDL at goal 37       Medication Adjustments/Labs and Tests Ordered: Current medicines are reviewed at length with the patient today.  Concerns regarding medicines are outlined above.  No orders of the defined types were placed in this encounter.  Meds ordered this encounter  Medications   clopidogrel (PLAVIX) 75 MG tablet    Sig: Take 1 tablet (75 mg total) by mouth daily.    Dispense:  90 tablet    Refill:  3    Patient Instructions  Medication Instructions:  Your physician has recommended you make the following change in your medication:  1) STOP taking Brilinta 2) START taking Aspirin 81 mg daily  3) START taking Plavix 75 mg daily *If you need a refill on your cardiac medications before your next appointment, please call your pharmacy*  Follow-Up: At [facility], you and your health needs are our priority.  As part of our continuing mission to provide you with exceptional heart care, we have created designated Provider Care Teams.  These Care Teams  include your primary Cardiologist (physician) and Advanced Practice Providers (APPs -  Physician Assistants and Nurse Practitioners) who all work together to provide you with the care you need, when you need it.  Your next appointment:   1 year(s)  The format for your next appointment:   In Person  Provider:   Meriam Sprague, MD     Important Information About Sugar            Signed, Meriam Sprague, MD  03/01/2022 10:30 AM    [facility]

## 2022-11-26 ENCOUNTER — Encounter (HOSPITAL_BASED_OUTPATIENT_CLINIC_OR_DEPARTMENT_OTHER): Payer: Medicare HMO | Admitting: Family Medicine

## 2022-11-27 ENCOUNTER — Ambulatory Visit (HOSPITAL_BASED_OUTPATIENT_CLINIC_OR_DEPARTMENT_OTHER): Payer: Medicare HMO

## 2022-11-27 ENCOUNTER — Ambulatory Visit (INDEPENDENT_AMBULATORY_CARE_PROVIDER_SITE_OTHER): Payer: Medicare HMO

## 2022-11-27 ENCOUNTER — Encounter (HOSPITAL_BASED_OUTPATIENT_CLINIC_OR_DEPARTMENT_OTHER): Payer: Self-pay

## 2022-11-27 VITALS — Ht 59.0 in | Wt 145.0 lb

## 2022-11-27 DIAGNOSIS — Z0000 Encounter for general adult medical examination without abnormal findings: Secondary | ICD-10-CM

## 2022-11-27 NOTE — Progress Notes (Signed)
Subjective:   Brenda Wilkins is a 77 y.o. female who presents for an Initial Medicare Annual Wellness Visit. I connected with  Brenda Wilkins on 11/27/22 by a audio enabled telemedicine application and verified that I am speaking with the correct person using two identifiers.  Patient Location: Home  Provider Location: Home Office  I discussed the limitations of evaluation and management by telemedicine. The patient expressed understanding and agreed to proceed.  Review of Systems     Cardiac Risk Factors include: advanced age (>74men, >12 women);hypertension;dyslipidemia     Objective:    Today's Vitals   11/27/22 1031  Weight: 145 lb (65.8 kg)  Height:  (1.499 m)   Body mass index is 29.29 kg/m.     11/27/2022   10:35 AM 08/05/2022   10:13 PM 08/05/2022    1:38 PM 10/02/2020    8:00 PM 08/08/2020    5:46 PM 07/17/2020    2:00 AM 07/16/2020    5:41 PM  Advanced Directives  Does Patient Have a Medical Advance Directive? Yes No No No No No No  Type of Estate agent of [place];Living will        Copy of Healthcare Power of Attorney in Chart? No - copy requested        Would patient like information on creating a medical advance directive?   No - Patient declined No - Patient declined No - Patient declined No - Patient declined     Current Medications (verified) Outpatient Encounter Medications as of 11/27/2022  Medication Sig   aspirin EC 81 MG tablet Take 81 mg by mouth in the morning.   atorvastatin (LIPITOR) 80 MG tablet Take 1 tablet (80 mg total) by mouth daily.   Cholecalciferol (VITAMIN D-3 PO) Take 1 capsule by mouth in the morning.   clopidogrel (PLAVIX) 75 MG tablet Take 1 tablet (75 mg total) by mouth daily. (Patient taking differently: Take 75 mg by mouth in the morning.)   ezetimibe (ZETIA) 10 MG tablet Take 1 tablet (10 mg total) by mouth daily.   metoprolol succinate (TOPROL-XL) 25 MG 24 hr tablet Take 0.5 tablets (12.5 mg total)  by mouth daily. (Patient taking differently: Take 12.5 mg by mouth in the morning.)   Multiple Vitamins-Minerals (CENTRUM ADULT PO) Take by mouth daily.   nitroGLYCERIN (NITROSTAT) 0.4 MG SL tablet PLACE 1 TABLET (0.4 MG TOTAL) UNDER THE TONGUE EVERY 5 (FIVE) MINUTES X 3 DOSES AS NEEDED FOR CHEST PAIN. (Patient taking differently: Place 0.4 mg under the tongue every 5 (five) minutes x 3 doses as needed for chest pain.)   TART CHERRY PO Take 1 capsule by mouth at bedtime.   No facility-administered encounter medications on file as of 11/27/2022.    Allergies (verified) Patient has no known allergies.   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   Past Surgical History:  Procedure Laterality Date   CARDIAC CATHETERIZATION  08/08/2020   CHOLECYSTECTOMY N/A 08/11/2022   Procedure: LAPAROSCOPIC CHOLECYSTECTOMY;  Surgeon: Quentin Ore, MD;  Location: [facility] OR;  Service: General;  Laterality: N/A;   CORONARY ARTERY BYPASS GRAFT N/A 07/18/2020   Procedure: CORONARY ARTERY BYPASS GRAFTING (CABG), ON PUMP, TIMES THREE, USING ENDOSCOPICALLY HARVESTED RIGHT GREATER SAPHENOUS VEIN;  Surgeon: Alleen Borne, MD;  Location: [facility] OR;  Service: Open  Heart Surgery;  Laterality: N/A;   CORONARY PRESSURE/FFR STUDY N/A 10/03/2020   Procedure: INTRAVASCULAR PRESSURE WIRE/FFR STUDY;  Surgeon: Runell Gess, MD;  Location: [facility] INVASIVE CV LAB;  Service: Cardiovascular;  Laterality: N/A;   CORONARY STENT INTERVENTION N/A 10/03/2020   Procedure: CORONARY STENT INTERVENTION;  Surgeon: Runell Gess, MD;  Location: [facility] INVASIVE CV LAB;  Service: Cardiovascular;  Laterality: N/A;   CORONARY/GRAFT ACUTE MI REVASCULARIZATION N/A 07/18/2020   Procedure: Coronary/Graft Acute MI Revascularization;  Surgeon: [place], Peter M, MD;  Location: [facility] INVASIVE CV LAB;   Service: Cardiovascular;  Laterality: N/A;   LEFT HEART CATH AND CORONARY ANGIOGRAPHY N/A 07/18/2020   Procedure: LEFT HEART CATH AND CORONARY ANGIOGRAPHY;  Surgeon: Tonny Bollman, MD;  Location: [facility] INVASIVE CV LAB;  Service: Cardiovascular;  Laterality: N/A;   LEFT HEART CATH AND CORS/GRAFTS ANGIOGRAPHY N/A 08/08/2020   Procedure: LEFT HEART CATH AND CORS/GRAFTS ANGIOGRAPHY;  Surgeon: Lennette Bihari, MD;  Location: [facility] INVASIVE CV LAB;  Service: Cardiovascular;  Laterality: N/A;   LEFT HEART CATH AND CORS/GRAFTS ANGIOGRAPHY N/A 10/03/2020   Procedure: LEFT HEART CATH AND CORS/GRAFTS ANGIOGRAPHY;  Surgeon: Runell Gess, MD;  Location: [facility] INVASIVE CV LAB;  Service: Cardiovascular;  Laterality: N/A;   TEE WITHOUT CARDIOVERSION N/A 07/18/2020   Procedure: TRANSESOPHAGEAL ECHOCARDIOGRAM (TEE);  Surgeon: Alleen Borne, MD;  Location: [facility] OR;  Service: Open Heart Surgery;  Laterality: N/A;   UMBILICAL HERNIA REPAIR  08/11/2022   Procedure: HERNIA REPAIR UMBILICAL ADULT;  Surgeon: Quentin Ore, MD;  Location: [facility] OR;  Service: General;;   History reviewed. No pertinent family history. Social History   Socioeconomic History   Marital status: Widowed    Spouse name: Not on file   Number of children: Not on file   Years of education: 12   Highest education level: Not on file  Occupational History   Not on file  Tobacco Use   Smoking status: Never   Smokeless tobacco: Never  Vaping Use   Vaping Use: Never used  Substance and Sexual Activity   Alcohol use: Never   Drug use: Never   Sexual activity: Not Currently  Other Topics Concern   Not on file  Social History Narrative   Not on file   Social Determinants of Health   Financial Resource Strain: Low Risk  (11/27/2022)   Overall Financial Resource Strain (CARDIA)    Difficulty of Paying Living Expenses: Not hard at all  Food Insecurity: No Food Insecurity (11/27/2022)   Hunger Vital Sign    Worried About Running Out of Food in  the Last Year: Never true    Ran Out of Food in the Last Year: Never true  Transportation Needs: No Transportation Needs (11/27/2022)   PRAPARE - Administrator, Civil Service (Medical): No    Lack of Transportation (Non-Medical): No  Physical Activity: Sufficiently Active (11/27/2022)   Exercise Vital Sign    Days of Exercise per Week: 5 days    Minutes of Exercise per Session: 60 min  Stress: No Stress Concern Present (11/27/2022)   [employer] of Occupational Health - Occupational Stress Questionnaire    Feeling of Stress : Not at all  Social Connections: Moderately Isolated (11/27/2022)   Social Connection and Isolation Panel [NHANES]    Frequency of Communication with Friends and Family: More than three times a week    Frequency of Social Gatherings with Friends and Family: More than three times a week  Attends Religious Services: Never    Active Member of Clubs or Organizations: Yes    Attends Banker Meetings: More than 4 times per year    Marital Status: Widowed    Tobacco Counseling Counseling given: Not Answered   Clinical Intake:  Pre-visit preparation completed: Yes  Pain : No/denies pain     Nutritional Risks: None Diabetes: No  How often do you need to have someone help you when you read instructions, pamphlets, or other written materials from your doctor or pharmacy?: 1 - Never  Diabetic?no   Interpreter Needed?: No  Information entered by :: Renie Ora, LPN   Activities of Daily Living    11/27/2022   10:35 AM 11/26/2022   10:05 PM  In your present state of health, do you have any difficulty performing the following activities:  Hearing? 0 0  Vision? 0 0  Difficulty concentrating or making decisions? 0 0  Walking or climbing stairs? 0 0  Dressing or bathing? 0 0  Doing errands, shopping? 0 0  Preparing Food and eating ? N N  Using the Toilet? N N  In the past six months, have you accidently leaked urine? N Y  Do  you have problems with loss of bowel control? N N  Managing your Medications? N N  Managing your Finances? N N  Housekeeping or managing your Housekeeping? N N    Patient Care Team: [place], Buren Kos, MD as PCP - General (Family Medicine) Meriam Sprague, MD as PCP - Cardiology (Cardiology)  Indicate any recent Medical Services you may have received from other than [facility] providers in the past year (date may be approximate).     Assessment:   This is a routine wellness examination for [place].  Hearing/Vision screen Vision Screening - Comments:: Wears rx glasses - up to date with routine eye exams with  [facility]   Dietary issues and exercise activities discussed: Current Exercise Habits: Home exercise routine, Type of exercise: walking, Time (Minutes): 60, Frequency (Times/Week): 5, Weekly Exercise (Minutes/Week): 300, Intensity: Mild, Exercise limited by: None identified   Goals Addressed             This Visit's Progress    DIET - INCREASE WATER INTAKE         Depression Screen    11/27/2022   10:34 AM 10/17/2022    1:29 PM 09/27/2020   11:29 AM  PHQ 2/9 Scores  PHQ - 2 Score 0 0 0  Exception Documentation  Medical reason     Fall Risk    11/27/2022   10:33 AM 11/26/2022   10:05 PM 10/17/2022    1:29 PM 09/27/2020   10:31 AM  Fall Risk   Falls in the past year? 0 0 0 0  Number falls in past yr: 0 0 0 0  Injury with Fall? 0 0 0 1  Risk for fall due to : No Fall Risks  No Fall Risks History of fall(s)  Follow up Falls prevention discussed  Falls evaluation completed Falls evaluation completed    FALL RISK PREVENTION PERTAINING TO THE HOME:  Any stairs in or around the home? Yes  If so, are there any without handrails? No  Home free of loose throw rugs in walkways, pet beds, electrical cords, etc? Yes  Adequate lighting in your home to reduce risk of falls? Yes   ASSISTIVE DEVICES UTILIZED TO PREVENT FALLS:  Life alert? No  Use of a  cane, walker or w/c? No  Grab bars in the bathroom? No  Shower chair or bench in shower? Yes  Elevated toilet seat or a handicapped toilet? No          11/27/2022   10:36 AM  6CIT Screen  What Year? 0 points  What month? 0 points  What time? 0 points  Count back from 20 0 points  Months in reverse 0 points  Repeat phrase 0 points  Total Score 0 points    Immunizations  There is no immunization history on file for this patient.  TDAP status: Due, Education has been provided regarding the importance of this vaccine. Advised may receive this vaccine at local pharmacy or Health Dept. Aware to provide a copy of the vaccination record if obtained from local pharmacy or Health Dept. Verbalized acceptance and understanding.  Flu Vaccine status: Declined, Education has been provided regarding the importance of this vaccine but patient still declined. Advised may receive this vaccine at local pharmacy or Health Dept. Aware to provide a copy of the vaccination record if obtained from local pharmacy or Health Dept. Verbalized acceptance and understanding.  Pneumococcal vaccine status: Declined,  Education has been provided regarding the importance of this vaccine but patient still declined. Advised may receive this vaccine at local pharmacy or Health Dept. Aware to provide a copy of the vaccination record if obtained from local pharmacy or Health Dept. Verbalized acceptance and understanding.   Covid-19 vaccine status: Completed vaccines  Qualifies for Shingles Vaccine? Yes   Zostavax completed No   Shingrix Completed?: No.    Education has been provided regarding the importance of this vaccine. Patient has been advised to call insurance company to determine out of pocket expense if they have not yet received this vaccine. Advised may also receive vaccine at local pharmacy or Health Dept. Verbalized acceptance and understanding.  Screening Tests Health Maintenance  Topic Date Due   COVID-19  Vaccine (1) Never done   Hepatitis C Screening  Never done   DTaP/Tdap/Td (1 - Tdap) Never done   Zoster Vaccines- Shingrix (1 of 2) Never done   COLONOSCOPY (Pts 45-32yrs Insurance coverage will need to be confirmed)  Never done   MAMMOGRAM  Never done   Pneumonia Vaccine 68+ Years old (1 of 1 - PCV) Never done   DEXA SCAN  Never done   INFLUENZA VACCINE  03/07/2023   Medicare Annual Wellness (AWV)  11/27/2023   HPV VACCINES  Aged Out    Health Maintenance  Health Maintenance Due  Topic Date Due   COVID-19 Vaccine (1) Never done   Hepatitis C Screening  Never done   DTaP/Tdap/Td (1 - Tdap) Never done   Zoster Vaccines- Shingrix (1 of 2) Never done   COLONOSCOPY (Pts 45-43yrs Insurance coverage will need to be confirmed)  Never done   MAMMOGRAM  Never done   Pneumonia Vaccine 4+ Years old (1 of 1 - PCV) Never done   DEXA SCAN  Never done    Colorectal cancer screening: Referral to GI placed Patient declined . Pt aware the office will call re: appt.  Mammogram status: Ordered Patient declined . Pt provided with contact info and advised to call to schedule appt.   Bone Density status: Ordered Patient declined . Pt provided with contact info and advised to call to schedule appt.  Lung Cancer Screening: (Low Dose CT Chest recommended if Age 19-80 years, 30 pack-year currently smoking OR have quit w/in 15years.) does not  qualify.   Lung Cancer Screening Referral: n/a  Additional Screening:  Hepatitis C Screening: does not qualify;   Vision Screening: Recommended annual ophthalmology exams for early detection of glaucoma and other disorders of the eye. Is the patient up to date with their annual eye exam?  Yes  Who is the provider or what is the name of the office in which the patient attends annual eye exams? [facility]  If pt is not established with a provider, would they like to be referred to a provider to establish care? No .   Dental Screening: Recommended annual  dental exams for proper oral hygiene  Community Resource Referral / Chronic Care Management: CRR required this visit?  No   CCM required this visit?  No      Plan:     I have personally reviewed and noted the following in the patient's chart:   Medical and social history Use of alcohol, tobacco or illicit drugs  Current medications and supplements including opioid prescriptions. Patient is not currently taking opioid prescriptions. Functional ability and status Nutritional status Physical activity Advanced directives List of other physicians Hospitalizations, surgeries, and ER visits in previous 12 months Vitals Screenings to include cognitive, depression, and falls Referrals and appointments  In addition, I have reviewed and discussed with patient certain preventive protocols, quality metrics, and best practice recommendations. A written personalized care plan for preventive services as well as general preventive health recommendations were provided to patient.     Lorrene Reid, LPN   1/61/0960   Nurse Notes: No Vaccines on File

## 2022-11-27 NOTE — Patient Instructions (Signed)
Ms. Brenda Wilkins , Thank you for taking time to come for your Medicare Wellness Visit. I appreciate your ongoing commitment to your health goals. Please review the following plan we discussed and let me know if I can assist you in the future.   These are the goals we discussed:  Goals      DIET - INCREASE WATER INTAKE        This is a list of the screening recommended for you and due dates:  Health Maintenance  Topic Date Due   COVID-19 Vaccine (1) Never done   Hepatitis C Screening: USPSTF Recommendation to screen - Ages 21-79 yo.  Never done   DTaP/Tdap/Td vaccine (1 - Tdap) Never done   Zoster (Shingles) Vaccine (1 of 2) Never done   Colon Cancer Screening  Never done   Mammogram  Never done   Pneumonia Vaccine (1 of 1 - PCV) Never done   DEXA scan (bone density measurement)  Never done   Flu Shot  03/07/2023   Medicare Annual Wellness Visit  11/27/2023   HPV Vaccine  Aged Out    Advanced directives:Advance directive discussed with you today. I have provided a copy for you to complete at home and have notarized. Once this is complete please bring a copy in to our office so we can scan it into your chart.   Conditions/risks identified: Aim for 30 minutes of exercise or brisk walking, 6-8 glasses of water, and 5 servings of fruits and vegetables each day.   Next appointment: Follow up in one year for your annual wellness visit    Preventive Care 65 Years and Older, Female Preventive care refers to lifestyle choices and visits with your health care provider that can promote health and wellness. What does preventive care include? A yearly physical exam. This is also called an annual well check. Dental exams once or twice a year. Routine eye exams. Ask your health care provider how often you should have your eyes checked. Personal lifestyle choices, including: Daily care of your teeth and gums. Regular physical activity. Eating a healthy diet. Avoiding tobacco and drug  use. Limiting alcohol use. Practicing safe sex. Taking low-dose aspirin every day. Taking vitamin and mineral supplements as recommended by your health care provider. What happens during an annual well check? The services and screenings done by your health care provider during your annual well check will depend on your age, overall health, lifestyle risk factors, and family history of disease. Counseling  Your health care provider may ask you questions about your: Alcohol use. Tobacco use. Drug use. Emotional well-being. Home and relationship well-being. Sexual activity. Eating habits. History of falls. Memory and ability to understand (cognition). Work and work Astronomer. Reproductive health. Screening  You may have the following tests or measurements: Height, weight, and BMI. Blood pressure. Lipid and cholesterol levels. These may be checked every 5 years, or more frequently if you are over 24 years old. Skin check. Lung cancer screening. You may have this screening every year starting at age 95 if you have a 30-pack-year history of smoking and currently smoke or have quit within the past 15 years. Fecal occult blood test (FOBT) of the stool. You may have this test every year starting at age 23. Flexible sigmoidoscopy or colonoscopy. You may have a sigmoidoscopy every 5 years or a colonoscopy every 10 years starting at age 32. Hepatitis C blood test. Hepatitis B blood test. Sexually transmitted disease (STD) testing. Diabetes screening. This is done by  checking your blood sugar (glucose) after you have not eaten for a while (fasting). You may have this done every 1-3 years. Bone density scan. This is done to screen for osteoporosis. You may have this done starting at age 33. Mammogram. This may be done every 1-2 years. Talk to your health care provider about how often you should have regular mammograms. Talk with your health care provider about your test results, treatment  options, and if necessary, the need for more tests. Vaccines  Your health care provider may recommend certain vaccines, such as: Influenza vaccine. This is recommended every year. Tetanus, diphtheria, and acellular pertussis (Tdap, Td) vaccine. You may need a Td booster every 10 years. Zoster vaccine. You may need this after age 74. Pneumococcal 13-valent conjugate (PCV13) vaccine. One dose is recommended after age 35. Pneumococcal polysaccharide (PPSV23) vaccine. One dose is recommended after age 39. Talk to your health care provider about which screenings and vaccines you need and how often you need them. This information is not intended to replace advice given to you by your health care provider. Make sure you discuss any questions you have with your health care provider. Document Released: 08/19/2015 Document Revised: 04/11/2016 Document Reviewed: 05/24/2015 Elsevier Interactive Patient Education  2017 [place] Prevention in the Home Falls can cause injuries. They can happen to people of all ages. There are many things you can do to make your home safe and to help prevent falls. What can I do on the outside of my home? Regularly fix the edges of walkways and driveways and fix any cracks. Remove anything that might make you trip as you walk through a door, such as a raised step or threshold. Trim any bushes or trees on the path to your home. Use bright outdoor lighting. Clear any walking paths of anything that might make someone trip, such as rocks or tools. Regularly check to see if handrails are loose or broken. Make sure that both sides of any steps have handrails. Any raised decks and porches should have guardrails on the edges. Have any leaves, snow, or ice cleared regularly. Use sand or salt on walking paths during winter. Clean up any spills in your garage right away. This includes oil or grease spills. What can I do in the bathroom? Use night lights. Install grab  bars by the toilet and in the tub and shower. Do not use towel bars as grab bars. Use non-skid mats or decals in the tub or shower. If you need to sit down in the shower, use a plastic, non-slip stool. Keep the floor dry. Clean up any water that spills on the floor as soon as it happens. Remove soap buildup in the tub or shower regularly. Attach bath mats securely with double-sided non-slip rug tape. Do not have throw rugs and other things on the floor that can make you trip. What can I do in the bedroom? Use night lights. Make sure that you have a light by your bed that is easy to reach. Do not use any sheets or blankets that are too big for your bed. They should not hang down onto the floor. Have a firm chair that has side arms. You can use this for support while you get dressed. Do not have throw rugs and other things on the floor that can make you trip. What can I do in the kitchen? Clean up any spills right away. Avoid walking on wet floors. Keep items that you use a  lot in easy-to-reach places. If you need to reach something above you, use a strong step stool that has a grab bar. Keep electrical cords out of the way. Do not use floor polish or wax that makes floors slippery. If you must use wax, use non-skid floor wax. Do not have throw rugs and other things on the floor that can make you trip. What can I do with my stairs? Do not leave any items on the stairs. Make sure that there are handrails on both sides of the stairs and use them. Fix handrails that are broken or loose. Make sure that handrails are as long as the stairways. Check any carpeting to make sure that it is firmly attached to the stairs. Fix any carpet that is loose or worn. Avoid having throw rugs at the top or bottom of the stairs. If you do have throw rugs, attach them to the floor with carpet tape. Make sure that you have a light switch at the top of the stairs and the bottom of the stairs. If you do not have them,  ask someone to add them for you. What else can I do to help prevent falls? Wear shoes that: Do not have high heels. Have rubber bottoms. Are comfortable and fit you well. Are closed at the toe. Do not wear sandals. If you use a stepladder: Make sure that it is fully opened. Do not climb a closed stepladder. Make sure that both sides of the stepladder are locked into place. Ask someone to hold it for you, if possible. Clearly mark and make sure that you can see: Any grab bars or handrails. First and last steps. Where the edge of each step is. Use tools that help you move around (mobility aids) if they are needed. These include: Canes. Walkers. Scooters. Crutches. Turn on the lights when you go into a dark area. Replace any light bulbs as soon as they burn out. Set up your furniture so you have a clear path. Avoid moving your furniture around. If any of your floors are uneven, fix them. If there are any pets around you, be aware of where they are. Review your medicines with your doctor. Some medicines can make you feel dizzy. This can increase your chance of falling. Ask your doctor what other things that you can do to help prevent falls. This information is not intended to replace advice given to you by your health care provider. Make sure you discuss any questions you have with your health care provider. Document Released: 05/19/2009 Document Revised: 12/29/2015 Document Reviewed: 08/27/2014 Elsevier Interactive Patient Education  2017 [employer].

## 2022-11-27 NOTE — Assessment & Plan Note (Signed)
Blood pressure appropriate in office, systolic is borderline, diastolic blood pressure is at goal.  Recommend continuing with current regimen, no change to medications today Recommend regular follow-up with cardiology as scheduled Recommend intermittent monitoring of blood pressure at home, DASH diet

## 2022-12-04 ENCOUNTER — Ambulatory Visit: Payer: Medicare HMO | Attending: Cardiology | Admitting: Cardiology

## 2022-12-04 ENCOUNTER — Encounter: Payer: Self-pay | Admitting: Cardiology

## 2022-12-04 VITALS — BP 124/86 | HR 73 | Ht 59.0 in | Wt 148.4 lb

## 2022-12-04 DIAGNOSIS — E785 Hyperlipidemia, unspecified: Secondary | ICD-10-CM

## 2022-12-04 DIAGNOSIS — L659 Nonscarring hair loss, unspecified: Secondary | ICD-10-CM

## 2022-12-04 DIAGNOSIS — Z79899 Other long term (current) drug therapy: Secondary | ICD-10-CM

## 2022-12-04 NOTE — Patient Instructions (Addendum)
Medication Instructions:  Hold Metoprolol for a couple of weeks. Please send [place] a message through your MyChart to notify us if your symptoms improve.   *If you need a refill on your cardiac medications before your next appointment, please call your pharmacy*   Lab Work:  SOMETIME THIS WEEK--TSH AND LIPIDS--PLEASE COME FASTING TO THIS LAB APPOINTMENT  If you have labs (blood work) drawn today and your tests are completely normal, you will receive your results only by: MyChart Message (if you have MyChart) OR A paper copy in the mail If you have any lab test that is abnormal or we need to change your treatment, we will call you to review the results.    Follow-Up: 6 months with Dr. Shari Prows At [facility], you and your health needs are our priority.  As part of our continuing mission to provide you with exceptional heart care, we have created designated Provider Care Teams.  These Care Teams include your primary Cardiologist (physician) and Advanced Practice Providers (APPs -  Physician Assistants and Nurse Practitioners) who all work together to provide you with the care you need, when you need it.  We recommend signing up for the patient portal called "MyChart".  Sign up information is provided on this After Visit Summary.  MyChart is used to connect with patients for Virtual Visits (Telemedicine).  Patients are able to view lab/test results, encounter notes, upcoming appointments, etc.  Non-urgent messages can be sent to your provider as well.   To learn more about what you can do with MyChart, go to [email or web address].    Your next appointment:   6 month(s)  Provider:   Meriam Sprague, MD     Other Instructions

## 2022-12-06 ENCOUNTER — Ambulatory Visit: Payer: Medicare HMO | Attending: Cardiology

## 2022-12-06 DIAGNOSIS — Z79899 Other long term (current) drug therapy: Secondary | ICD-10-CM

## 2022-12-06 DIAGNOSIS — L659 Nonscarring hair loss, unspecified: Secondary | ICD-10-CM

## 2022-12-06 DIAGNOSIS — E785 Hyperlipidemia, unspecified: Secondary | ICD-10-CM | POA: Diagnosis not present

## 2022-12-06 LAB — LIPID PANEL
Chol/HDL Ratio: 3.1 ratio (ref 0.0–4.4)
Cholesterol, Total: 128 mg/dL (ref 100–199)
HDL: 41 mg/dL (ref >39)
LDL Chol Calc (NIH): 63 mg/dL (ref 0–99)
Triglycerides: 139 mg/dL (ref 0–149)
VLDL Cholesterol Cal: 24 mg/dL (ref 5–40)

## 2022-12-06 LAB — TSH: TSH: 3.33 u[IU]/mL (ref 0.450–4.500)

## 2022-12-17 ENCOUNTER — Encounter: Payer: Self-pay | Admitting: Cardiology

## 2022-12-18 ENCOUNTER — Ambulatory Visit (HOSPITAL_BASED_OUTPATIENT_CLINIC_OR_DEPARTMENT_OTHER): Payer: Medicare HMO

## 2023-01-28 ENCOUNTER — Other Ambulatory Visit: Payer: Self-pay

## 2023-01-28 MED ORDER — METOPROLOL SUCCINATE ER 25 MG PO TB24: 12.5000 mg | ORAL_TABLET | Freq: Every day | ORAL | 3 refills | Status: DC

## 2023-01-29 ENCOUNTER — Encounter: Payer: Self-pay | Admitting: Cardiology

## 2023-02-23 ENCOUNTER — Other Ambulatory Visit: Payer: Self-pay | Admitting: Physician Assistant

## 2023-02-23 DIAGNOSIS — Z79899 Other long term (current) drug therapy: Secondary | ICD-10-CM

## 2023-02-23 DIAGNOSIS — I25118 Atherosclerotic heart disease of native coronary artery with other forms of angina pectoris: Secondary | ICD-10-CM

## 2023-02-23 DIAGNOSIS — E785 Hyperlipidemia, unspecified: Secondary | ICD-10-CM

## 2023-02-23 DIAGNOSIS — Z951 Presence of aortocoronary bypass graft: Secondary | ICD-10-CM

## 2023-02-25 ENCOUNTER — Other Ambulatory Visit: Payer: Self-pay

## 2023-02-25 MED ORDER — CLOPIDOGREL BISULFATE 75 MG PO TABS: 75.0000 mg | ORAL_TABLET | Freq: Every morning | ORAL | 2 refills | Status: DC

## 2023-03-08 ENCOUNTER — Other Ambulatory Visit (HOSPITAL_BASED_OUTPATIENT_CLINIC_OR_DEPARTMENT_OTHER): Payer: Self-pay

## 2023-03-19 ENCOUNTER — Encounter (HOSPITAL_BASED_OUTPATIENT_CLINIC_OR_DEPARTMENT_OTHER): Payer: Self-pay | Admitting: Family Medicine

## 2023-03-19 ENCOUNTER — Ambulatory Visit (INDEPENDENT_AMBULATORY_CARE_PROVIDER_SITE_OTHER): Payer: Medicare HMO | Admitting: Family Medicine

## 2023-03-19 VITALS — BP 137/71 | HR 56 | Ht 59.0 in | Wt 148.0 lb

## 2023-03-19 DIAGNOSIS — I10 Essential (primary) hypertension: Secondary | ICD-10-CM

## 2023-03-19 DIAGNOSIS — L659 Nonscarring hair loss, unspecified: Secondary | ICD-10-CM

## 2023-03-19 DIAGNOSIS — Z13820 Encounter for screening for osteoporosis: Secondary | ICD-10-CM | POA: Diagnosis not present

## 2023-03-19 NOTE — Progress Notes (Signed)
    Procedures performed today:    None.  Independent interpretation of notes and tests performed by another provider:   None.  Brief History, Exam, Impression, and Recommendations:    BP 137/71 Comment: Repeat BP  Pulse (!) 56   Ht 4\' 11"  (1.499 m)   Wt 148 lb (67.1 kg)   SpO2 99%   BMI 29.89 kg/m   Primary hypertension Assessment & Plan: Blood pressure appropriate in office, systolic is borderline, diastolic blood pressure is at goal.  Recommend continuing with current regimen, no change to medications today Recommend regular follow-up with cardiology as scheduled Recommend intermittent monitoring of blood pressure at home, DASH diet   Hair loss Assessment & Plan: Noticed that she was having some issues with hair loss previously.  Did discuss with cardiologist and patient held metoprolol for short period of time to see if this led to any change.  She continued to have issues with hair loss and thus resumed metoprolol.  She reports that presently her symptoms have lessened, still has some hair loss but not to the degree that it was previously. Discussed options with patient, given general improvement, can continue with monitoring for now.  If any worsening does occur, consider referral to dermatology for further evaluation   Osteoporosis screening -     DG Bone Density; Future  Discussed recommendations pertaining to bone density testing as well as breast cancer screening with mammogram.  She declines mammogram, is amenable to proceeding with DEXA scan, order placed today.  Return in about 6 months (around 09/19/2023).   ___________________________________________ Zarian Colpitts de [place], MD, ABFM, CAQSM Primary Care and Sports Medicine [facility]

## 2023-03-19 NOTE — Assessment & Plan Note (Signed)
Noticed that she was having some issues with hair loss previously.  Did discuss with cardiologist and patient held metoprolol for short period of time to see if this led to any change.  She continued to have issues with hair loss and thus resumed metoprolol.  She reports that presently her symptoms have lessened, still has some hair loss but not to the degree that it was previously. Discussed options with patient, given general improvement, can continue with monitoring for now.  If any worsening does occur, consider referral to dermatology for further evaluation

## 2023-03-19 NOTE — Assessment & Plan Note (Signed)
Blood pressure appropriate in office, systolic is borderline, diastolic blood pressure is at goal.  Recommend continuing with current regimen, no change to medications today Recommend regular follow-up with cardiology as scheduled Recommend intermittent monitoring of blood pressure at home, DASH diet

## 2023-04-27 ENCOUNTER — Other Ambulatory Visit: Payer: Self-pay | Admitting: Physician Assistant

## 2023-04-29 MED ORDER — ATORVASTATIN CALCIUM 80 MG PO TABS: 80.0000 mg | ORAL_TABLET | Freq: Every day | ORAL | 3 refills | Status: DC

## 2023-04-29 NOTE — Addendum Note (Signed)
Addended by: Lajoyce Lauber on: 04/29/2023 12:45 PM   Modules accepted: Orders

## 2023-06-02 NOTE — Progress Notes (Unsigned)
Office Visit    Patient Name: Brenda Wilkins Date of Encounter: 06/03/2023  PCP:  de [place], Raymond J, MD   [facility]  Cardiologist:  Olga Millers, MD  Advanced Practice Provider:  No care team member to display Electrophysiologist:  None   HPI    Brenda Wilkins is a 77 y.o. female with a past medical history of known CAD status post CABG 07/26/2020 with subsequent NSTEMI's (08/07/2020 and 09/29/2020) found to have multiple occluded grafts status post PCI to SVG-diagonal and HLD presents today for follow-up appointment.  Patient sent for NSTEMI 07/2021 and found to have multivessel CAD on cardiac cath 07/17/2020 status post three-vessel CABG on 07/18/2020 (SVG to LAD, sequential SVG to diagonal and OM).  Postoperative course was complicated by recurrent chest pain causing her to represents to the ED on 08/07/2020.  In the ED, she was found to have elevated troponin consistent with NSTEMI.  Underwent repeat cath which revealed occluded SVG-LAD graft but TIMI-3 flow down the LAD from SVG-diagonal graft.  Case was discussed with CV surgery and given of risk TIMI-3 flow down the LAD from the other bypass graft, decision was made to manage medically.  Doing well until 09/29/2020 where she developed progressive chest pain.  Presented to [facility] on 10/02/2020 and underwent coronary angiography where she had 95% stenosis of the graft for the first diagonal and 100% stenosis of the origin of the graft between the first diagonal and OM 2.  A DES was placed in the origin of the diagonal branch SVG.  Her post cath course was complicated by a right groin hematoma and pseudoaneurysm.  Managed conservatively.  Call the office prior to her visit on 10/17/2020 as she noted increased swelling in her groin with a palpable firm mass at prior groin access site.  CBC was obtained which showed hemoglobin 12.6 which was improved from 10.3 on discharge.  Ultrasound consistent with hematoma but no  evidence of pseudoaneurysm.  During her visit, she was stable and tolerating her medications.  Groin site was sizable, firm hematoma which she had been monitoring and was not increasing.  Was seen in the clinic 08/2021 where she was doing well from a CV standpoint.  She was last seen 03/01/2022 and was feeling well at that time.  No chest pain.  Staying active and was able to walk 30 minutes 5 days a week without issues.  Tolerating medications without issues.  Blood pressure well-controlled.  I saw her 08/2022, she states she was in the ER three times for stomach pain and vomiting. By the third time her lipase had increased to 1800 and she ended up having her gall bladder removed. She really has not had any pain since surgery on Saturday. No issues with her heart. No SOB or chest pain. Troponin negative in the hospital. No medication issues today.   She then saw Dr. Shari Prows 11/2022 and was doing well without any symptoms.  Walking regularly and taking her prescribed medications.  No bleeding on Plavix.  Mainly concerned about recent hair loss.  Today, she has not had any issues with her heart.  No issues with her gallbladder which was removed earlier this year.  Fortunately, no chest pain or shortness of breath.  No palpitations.  She feels pretty good and has been increasing her activity and doing walking as well as water aerobics exercise.  Typically in the water her heart rate will reach around 100 bpm.  I encouraged her to  push this a little higher if she is comfortable.  Main symptoms to watch for is inability to get her breath.  Some shortness of breath is expected when exerting herself.  Otherwise, tolerating medications with no bleeding from Plavix/aspirin combo.  Would like to switch to Dr. Jens Som.  Reports no shortness of breath nor dyspnea on exertion. Reports no chest pain, pressure, or tightness. No edema, orthopnea, PND. Reports no palpitations.    Past Medical History    Past Medical  History:  Diagnosis Date   Coronary artery disease    Hyperlipidemia    Hypertension    NSTEMI (non-ST elevated myocardial infarction) (HCC) 07/16/2020   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   Past Surgical History:  Procedure Laterality Date   CARDIAC CATHETERIZATION  08/08/2020   CHOLECYSTECTOMY N/A 08/11/2022   Procedure: LAPAROSCOPIC CHOLECYSTECTOMY;  Surgeon: Quentin Ore, MD;  Location: [facility] OR;  Service: General;  Laterality: N/A;   CORONARY ARTERY BYPASS GRAFT N/A 07/18/2020   Procedure: CORONARY ARTERY BYPASS GRAFTING (CABG), ON PUMP, TIMES THREE, USING ENDOSCOPICALLY HARVESTED RIGHT GREATER SAPHENOUS VEIN;  Surgeon: Alleen Borne, MD;  Location: [facility] OR;  Service: Open Heart Surgery;  Laterality: N/A;   CORONARY PRESSURE/FFR STUDY N/A 10/03/2020   Procedure: INTRAVASCULAR PRESSURE WIRE/FFR STUDY;  Surgeon: Runell Gess, MD;  Location: [facility] INVASIVE CV LAB;  Service: Cardiovascular;  Laterality: N/A;   CORONARY STENT INTERVENTION N/A 10/03/2020   Procedure: CORONARY STENT INTERVENTION;  Surgeon: Runell Gess, MD;  Location: [facility] INVASIVE CV LAB;  Service: Cardiovascular;  Laterality: N/A;   CORONARY/GRAFT ACUTE MI REVASCULARIZATION N/A 07/18/2020   Procedure: Coronary/Graft Acute MI Revascularization;  Surgeon: [place], Peter M, MD;  Location: [facility] INVASIVE CV LAB;  Service: Cardiovascular;  Laterality: N/A;   LEFT HEART CATH AND CORONARY ANGIOGRAPHY N/A 07/18/2020   Procedure: LEFT HEART CATH AND CORONARY ANGIOGRAPHY;  Surgeon: Tonny Bollman, MD;  Location: [facility] INVASIVE CV LAB;  Service: Cardiovascular;  Laterality: N/A;   LEFT HEART CATH AND CORS/GRAFTS ANGIOGRAPHY N/A 08/08/2020   Procedure: LEFT HEART CATH AND CORS/GRAFTS ANGIOGRAPHY;  Surgeon: Lennette Bihari, MD;  Location: [facility] INVASIVE CV LAB;  Service: Cardiovascular;  Laterality: N/A;   LEFT HEART CATH AND CORS/GRAFTS ANGIOGRAPHY N/A 10/03/2020   Procedure: LEFT HEART CATH AND CORS/GRAFTS ANGIOGRAPHY;  Surgeon: Runell Gess, MD;   Location: [facility] INVASIVE CV LAB;  Service: Cardiovascular;  Laterality: N/A;   TEE WITHOUT CARDIOVERSION N/A 07/18/2020   Procedure: TRANSESOPHAGEAL ECHOCARDIOGRAM (TEE);  Surgeon: Alleen Borne, MD;  Location: [facility] OR;  Service: Open Heart Surgery;  Laterality: N/A;   UMBILICAL HERNIA REPAIR  08/11/2022   Procedure: HERNIA REPAIR UMBILICAL ADULT;  Surgeon: Quentin Ore, MD;  Location: [facility] OR;  Service: General;;    Allergies  No Known Allergies   EKGs/Labs/Other Studies Reviewed:   The following studies were reviewed today: Ultrasound 10/18/20: Summary:  The avascular inhomogeneous structure noted in the right groin has  characteristics of a hematoma, measuring 6.7 cm in length and 4.4 cm in  width.    [place] PSA 10/04/20: A mixed echogenic structure measuring approximately 4.1 cm x 1.8 cm is  visualized at the right groin with ultrasound characteristics of a  hematoma.    ____________________   Left heart cath 10/03/20: Origin lesion is 100% stenosed. Ost Cx to Prox Cx lesion is 80% stenosed. Ost LAD to Mid LAD lesion is 100% stenosed. Origin to Prox Graft lesion before 1st Diag is 95% stenosed. Origin to Prox Graft  lesion between 1st Diag and 3rd Mrg is 100% stenosed. A drug-eluting stent was successfully placed using a STENT RESOLUTE ONYX 4.5X22. Post intervention, there is a 10% residual stenosis. The left ventricular ejection fraction is 50-55% by visual estimate. The left ventricular systolic function is normal. LV end diastolic pressure is normal.   IMPRESSION: Successful PCI drug-eluting stenting of the origin of the diagonal branch SVG with occlusion of the continuation of the diagonal to the obtuse marginal branch.  The DFR of the proximal native circumflex was 0.99 suggesting this was not physiologically significant.  There was dampening at the origin of the dominant RCA which had been demonstrated in several prior cardiac cath but no obstructive disease was noted.   The sheath was secured in place.  The patient was already on aspirin and ticagrelor.  The sheath will be removed once ACT falls below 170 pressure held.  Patient will be hydrated overnight, discharged home in the morning and follow-up with Dr. Shari Prows.` _____________   Echo 08/08/20:  1. Septal hypokinesis consistent with post-operative state. Left  ventricular ejection fraction, by estimation, is 50 to 55%. The left  ventricle has low normal function. The left ventricle demonstrates  regional wall motion abnormalities (see scoring  diagram/findings for description). Left ventricular diastolic parameters  are consistent with Grade I diastolic dysfunction (impaired relaxation).   2. Right ventricular systolic function is normal. The right ventricular  size is normal.   3. The mitral valve is normal in structure. Trivial mitral valve  regurgitation. No evidence of mitral stenosis.   4. The aortic valve is tricuspid. Aortic valve regurgitation is not  visualized. No aortic stenosis is present.   5. The inferior vena cava is normal in size with greater than 50%  respiratory variability, suggesting right atrial pressure of 3 mmHg.    Cath 08/09/19: Prox Cx lesion is 80% stenosed. Prox LAD to Mid LAD lesion is 100% stenosed. Mid LAD lesion is 80% stenosed. Origin to Prox Graft lesion is 100% stenosed. Ost RCA lesion is 40% stenosed. There is mild to moderate left ventricular systolic dysfunction. LV end diastolic pressure is mildly elevated.   Mild LV dysfunction with mild anterolateral hypocontractility, EF estimate at 40% with LVEDP at 20 mmHg.   Significant native CAD with total occlusion of the LAD immediately proximal to the takeoff of the diagonal vessel.   Normal bifurcating ramus intermediate vessel.   Large left circumflex coronary artery with 80% proximal stenosis and competitive filling of a large OM 3 vessel.   Smooth ostial narrowing of the RCA of approximately 30 to 40%.   However with catheter engagement there was significant catheter dampening which improved following IC nitroglycerin administration.   Occluded SVG which had supplied the LAD.   Patent sequential SVG supplying the diagonal vessel which then fills the LAD with previously noted 80% stenosis on an angle in the LAD immediately after the diagonal takeoff, and sequential graft supplying a large OM 3 vessel of the left circumflex coronary artery.   RECOMMENDATION: The patient's non-ST segment elevation myocardial infarction most likely is due to acute occlusion of the vein graft which had supplied the LAD.  This graft is 74 weeks old.  I suspect this occlusion may have contributed by the fact that the LAD is supplied by the diagonal vessel which is grafted and there is TIMI-3 flow down the LAD system.  There continues to be a high-grade 80+ percent stenosis on an angle in the LAD arising from the  diagonal vessel which 3 weeks ago was not able to be entered with a wire during attempted acute angioplasty.  Since the patient is pain-free and there is a high likelihood of thrombus in the vein graft which has occluded plan initial aggressive medical management.  Will initiate DAPT with aspirin/Brilinta.  Continue beta-blocker therapy, initiate nitrate therapy, and continue aggressive lipid-lowering therapy.  If patient continues to have symptoms, consider addition of ranolazine and possible future intervention to the native LAD after an aggressive medical therapy trial.    Diagnostic Dominance: Right      Intervention     TTE 08/08/20: IMPRESSIONS   1. Septal hypokinesis consistent with post-operative state. Left  ventricular ejection fraction, by estimation, is 50 to 55%. The left  ventricle has low normal function. The left ventricle demonstrates  regional wall motion abnormalities (see scoring  diagram/findings for description). Left ventricular diastolic parameters  are consistent with Grade I diastolic  dysfunction (impaired relaxation).   2. Right ventricular systolic function is normal. The right ventricular  size is normal.   3. The mitral valve is normal in structure. Trivial mitral valve  regurgitation. No evidence of mitral stenosis.   4. The aortic valve is tricuspid. Aortic valve regurgitation is not  visualized. No aortic stenosis is present.   5. The inferior vena cava is normal in size with greater than 50%  respiratory variability, suggesting right atrial pressure of 3 mmHg.    Cath 07/18/20:   1st Diag lesion is 90% stenosed. Prox Cx lesion is 75% stenosed. Prox LAD-1 lesion is 100% stenosed. Prox LAD-2 lesion is 95% stenosed with 95% stenosed side branch in 1st Diag. Post intervention, there is a 95% residual stenosis. Post intervention, there is a 95% residual stenosis. Post intervention, the side branch was reduced to 95% residual stenosis.   1. Acute occlusion of the proximal LAD at site of very complex bifurcation lesion. Successful restoration of antegrade flow with POBA into the first diagonal branch. Unable to cross the lesion in the LAD distal to the diagonal with a wire.    Plan: emergent CABG.    Diagnostic Dominance: Right      Intervention          TTE 07/17/20: IMPRESSIONS   1. Left ventricular ejection fraction, by estimation, is 55 to 60%. The  left ventricle has normal function. The left ventricle has no regional  wall motion abnormalities. There is moderate asymmetric left ventricular  hypertrophy. Left ventricular  diastolic parameters are consistent with Grade I diastolic dysfunction  (impaired relaxation).   2. Right ventricular systolic function is normal. The right ventricular  size is normal.   3. The mitral valve is grossly normal. No evidence of mitral valve  regurgitation.   4. The aortic valve was not well visualized. Aortic valve regurgitation  is not visualized. No aortic stenosis is present.   Comparison(s): No prior  Echocardiogram.   Conclusion(s)/Recommendation(s): Normal biventricular function without  evidence of hemodynamically significant valvular heart disease.   FINDINGS   Left Ventricle: Left ventricular ejection fraction, by estimation, is 55  to 60%. The left ventricle has normal function. The left ventricle has no  regional wall motion abnormalities. The left ventricular internal cavity  size was small. There is moderate   asymmetric left ventricular hypertrophy. Left ventricular diastolic  parameters are consistent with Grade I diastolic dysfunction (impaired  relaxation).   Right Ventricle: The right ventricular size is normal. No increase in  right ventricular wall thickness. Right  ventricular systolic function is  normal.   Left Atrium: Left atrial size was normal in size.   Right Atrium: Right atrial size was normal in size.   Pericardium: There is no evidence of pericardial effusion.   Mitral Valve: The mitral valve is grossly normal. No evidence of mitral  valve regurgitation.   Tricuspid Valve: The tricuspid valve is grossly normal. Tricuspid valve  regurgitation is not demonstrated.   Aortic Valve: The aortic valve was not well visualized. Aortic valve  regurgitation is not visualized. No aortic stenosis is present.   Pulmonic Valve: The pulmonic valve was grossly normal. Pulmonic valve  regurgitation is not visualized.   Aorta: The aortic root and ascending aorta are structurally normal, with  no evidence of dilitation.   IAS/Shunts: The atrial septum is grossly normal.    07/18/20: CABG Op note: Procedure:   Emergent Median Sternotomy Extracorporeal circulation 3.   Coronary artery bypass grafting x 3   Saphenous vein graft to the LAD Sequential SVG to diagonal and OM.     4.   Endoscopic vein harvest from the right leg  EKG:  EKG is not ordered today.   Recent Labs: 08/10/2022: ALT 191 08/12/2022: BUN 13; Creatinine, Ser 0.99; Hemoglobin 12.7; Platelets  216; Potassium 4.1; Sodium 136 12/06/2022: TSH 3.330  Recent Lipid Panel    Component Value Date/Time   CHOL 128 12/06/2022 0819   TRIG 139 12/06/2022 0819   HDL 41 12/06/2022 0819   CHOLHDL 3.1 12/06/2022 0819   CHOLHDL 6.4 07/17/2020 1015   VLDL 31 07/17/2020 1015   LDLCALC 63 12/06/2022 0819    Home Medications   Current Meds  Medication Sig   aspirin EC 81 MG tablet Take 81 mg by mouth in the morning.   atorvastatin (LIPITOR) 80 MG tablet Take 1 tablet (80 mg total) by mouth daily.   Cholecalciferol (VITAMIN D-3 PO) Take 1 capsule by mouth in the morning.   clopidogrel (PLAVIX) 75 MG tablet Take 1 tablet (75 mg total) by mouth in the morning.   ezetimibe (ZETIA) 10 MG tablet TAKE 1 TABLET BY MOUTH EVERY DAY   metoprolol succinate (TOPROL-XL) 25 MG 24 hr tablet Take 0.5 tablets (12.5 mg total) by mouth daily.   Multiple Vitamins-Minerals (CENTRUM ADULT PO) Take by mouth daily.   nitroGLYCERIN (NITROSTAT) 0.4 MG SL tablet PLACE 1 TABLET (0.4 MG TOTAL) UNDER THE TONGUE EVERY 5 (FIVE) MINUTES X 3 DOSES AS NEEDED FOR CHEST PAIN. (Patient taking differently: Place 0.4 mg under the tongue every 5 (five) minutes x 3 doses as needed for chest pain.)   TART CHERRY PO Take 1 capsule by mouth at bedtime.     Review of Systems      All other systems reviewed and are otherwise negative except as noted above.  Physical Exam    VS:  BP 112/68   Pulse 74   Ht 4\' 11"  (1.499 m)   Wt 152 lb (68.9 kg)   SpO2 97%   BMI 30.70 kg/m  , BMI Body mass index is 30.7 kg/m.  Wt Readings from Last 3 Encounters:  06/03/23 152 lb (68.9 kg)  03/19/23 148 lb (67.1 kg)  12/04/22 148 lb 6.4 oz (67.3 kg)     GEN: Well nourished, well developed, in no acute distress. HEENT: normal. Neck: Supple, no JVD, carotid bruits, or masses. Cardiac: RRR, no murmurs, rubs, or gallops. No clubbing, cyanosis, edema.  Radials/PT 2+ and equal bilaterally.  Respiratory:  Respirations regular and  unlabored, clear to  auscultation bilaterally. GI: Soft, nontender, nondistended. MS: No deformity or atrophy. Skin: Warm and dry, no rash. Neuro:  Strength and sensation are intact. Psych: Normal affect.  Assessment & Plan    Multivessel CAD status post CABG 07/18/2020 -plavix and asa -no chest pain -continue current medications lipitor 80mg  daily, zetia 10mg  daily, nitro as needed (she has not needed), and metoprolol succinate 12.5 mg daily  History of NSTEMI status post PCI to SVG-D1 graft -plavix and asa continue -no issues with bleeding  Hyperlipidemia -LDL 63 -lipid panel due 5/25 -Continue current medication regimen and continue low-sodium, heart healthy diet  Hypertension -BP well controlled today -continue current medications       Disposition: Follow up 6 months or sooner with Olga Millers, MD or APP.  Signed, Sharlene Dory, PA-C 06/03/2023, 11:01 AM [facility]

## 2023-06-03 ENCOUNTER — Encounter: Payer: Self-pay | Admitting: Physician Assistant

## 2023-06-03 ENCOUNTER — Ambulatory Visit: Payer: Medicare HMO | Attending: Physician Assistant | Admitting: Physician Assistant

## 2023-06-03 VITALS — BP 112/68 | HR 74 | Ht 59.0 in | Wt 152.0 lb

## 2023-06-03 DIAGNOSIS — I10 Essential (primary) hypertension: Secondary | ICD-10-CM | POA: Diagnosis not present

## 2023-06-03 DIAGNOSIS — E785 Hyperlipidemia, unspecified: Secondary | ICD-10-CM

## 2023-06-03 DIAGNOSIS — Z951 Presence of aortocoronary bypass graft: Secondary | ICD-10-CM | POA: Diagnosis not present

## 2023-06-03 DIAGNOSIS — I214 Non-ST elevation (NSTEMI) myocardial infarction: Secondary | ICD-10-CM | POA: Diagnosis not present

## 2023-06-03 DIAGNOSIS — Z79899 Other long term (current) drug therapy: Secondary | ICD-10-CM | POA: Diagnosis not present

## 2023-06-03 DIAGNOSIS — I2510 Atherosclerotic heart disease of native coronary artery without angina pectoris: Secondary | ICD-10-CM | POA: Diagnosis not present

## 2023-06-03 NOTE — Patient Instructions (Signed)
Medication Instructions:  Your physician recommends that you continue on your current medications as directed. Please refer to the Current Medication list given to you today.  *If you need a refill on your cardiac medications before your next appointment, please call your pharmacy*   Lab Work: None ordered   If you have labs (blood work) drawn today and your tests are completely normal, you will receive your results only by: MyChart Message (if you have MyChart) OR A paper copy in the mail If you have any lab test that is abnormal or we need to change your treatment, we will call you to review the results.   Testing/Procedures: None ordered    Follow-Up: At [facility], you and your health needs are our priority.  As part of our continuing mission to provide you with exceptional heart care, we have created designated Provider Care Teams.  These Care Teams include your primary Cardiologist (physician) and Advanced Practice Providers (APPs -  Physician Assistants and Nurse Practitioners) who all work together to provide you with the care you need, when you need it.  We recommend signing up for the patient portal called "MyChart".  Sign up information is provided on this After Visit Summary.  MyChart is used to connect with patients for Virtual Visits (Telemedicine).  Patients are able to view lab/test results, encounter notes, upcoming appointments, etc.  Non-urgent messages can be sent to your provider as well.   To learn more about what you can do with MyChart, go to [email or web address].    Your next appointment:   First available   Provider:   Olga Millers, MD     Other Instructions  Heart-Healthy Eating Plan Eating a healthy diet is important for the health of your heart. A heart-healthy eating plan includes: Eating less unhealthy fats. Eating more healthy fats. Eating less salt in your food. Salt is also called sodium. Making other changes in your diet. Talk  with your doctor or a diet specialist (dietitian) to create an eating plan that is right for you. What is my plan? Your doctor may recommend an eating plan that includes: Total fat: ______% or less of total calories a day. Saturated fat: ______% or less of total calories a day. Cholesterol: less than _________mg a day. Sodium: less than _________mg a day. What are tips for following this plan? Cooking Avoid frying your food. Try to bake, boil, grill, or broil it instead. You can also reduce fat by: Removing the skin from poultry. Removing all visible fats from meats. Steaming vegetables in water or broth. Meal planning  At meals, divide your plate into four equal parts: Fill one-half of your plate with vegetables and green salads. Fill one-fourth of your plate with whole grains. Fill one-fourth of your plate with lean protein foods. Eat 2-4 cups of vegetables per day. One cup of vegetables is: 1 cup (91 g) broccoli or cauliflower florets. 2 medium carrots. 1 large bell pepper. 1 large sweet potato. 1 large tomato. 1 medium white potato. 2 cups (150 g) raw leafy greens. Eat 1-2 cups of fruit per day. One cup of fruit is: 1 small apple 1 large banana 1 cup (237 g) mixed fruit, 1 large orange,  cup (82 g) dried fruit, 1 cup (240 mL) 100% fruit juice. Eat more foods that have soluble fiber. These are apples, broccoli, carrots, beans, peas, and barley. Try to get 20-30 g of fiber per day. Eat 4-5 servings of nuts, legumes, and seeds per week:  1 serving of dried beans or legumes equals  cup (90 g) cooked. 1 serving of nuts is  oz (12 almonds, 24 pistachios, or 7 walnut halves). 1 serving of seeds equals  oz (8 g). General information Eat more home-cooked food. Eat less restaurant, buffet, and fast food. Limit or avoid alcohol. Limit foods that are high in starch and sugar. Avoid fried foods. Lose weight if you are overweight. Keep track of how much salt (sodium) you eat.  This is important if you have high blood pressure. Ask your doctor to tell you more about this. Try to add vegetarian meals each week. Fats Choose healthy fats. These include olive oil and canola oil, flaxseeds, walnuts, almonds, and seeds. Eat more omega-3 fats. These include salmon, mackerel, sardines, tuna, flaxseed oil, and ground flaxseeds. Try to eat fish at least 2 times each week. Check food labels. Avoid foods with trans fats or high amounts of saturated fat. Limit saturated fats. These are often found in animal products, such as meats, butter, and cream. These are also found in plant foods, such as palm oil, palm kernel oil, and coconut oil. Avoid foods with partially hydrogenated oils in them. These have trans fats. Examples are stick margarine, some tub margarines, cookies, crackers, and other baked goods. What foods should I eat? Fruits All fresh, canned (in natural juice), or frozen fruits. Vegetables Fresh or frozen vegetables (raw, steamed, roasted, or grilled). Green salads. Grains Most grains. Choose whole wheat and whole grains most of the time. Rice and pasta, including brown rice and pastas made with whole wheat. Meats and other proteins Lean, well-trimmed beef, veal, pork, and lamb. Chicken and [place] without skin. All fish and shellfish. Wild duck, rabbit, pheasant, and venison. Egg whites or low-cholesterol egg substitutes. Dried beans, peas, lentils, and tofu. Seeds and most nuts. Dairy Low-fat or nonfat cheeses, including ricotta and mozzarella. Skim or 1% milk that is liquid, powdered, or evaporated. Buttermilk that is made with low-fat milk. Nonfat or low-fat yogurt. Fats and oils Non-hydrogenated (trans-free) margarines. Vegetable oils, including soybean, sesame, sunflower, olive, peanut, safflower, corn, canola, and cottonseed. Salad dressings or mayonnaise made with a vegetable oil. Beverages Mineral water. Coffee and tea. Diet carbonated beverages. Sweets and  desserts Sherbet, gelatin, and fruit ice. Small amounts of dark chocolate. Limit all sweets and desserts. Seasonings and condiments All seasonings and condiments. The items listed above may not be a complete list of foods and drinks you can eat. Contact a dietitian for more options. What foods should I avoid? Fruits Canned fruit in heavy syrup. Fruit in cream or butter sauce. Fried fruit. Limit coconut. Vegetables Vegetables cooked in cheese, cream, or butter sauce. Fried vegetables. Grains Breads that are made with saturated or trans fats, oils, or whole milk. Croissants. Sweet rolls. Donuts. High-fat crackers, such as cheese crackers. Meats and other proteins Fatty meats, such as hot dogs, ribs, sausage, bacon, rib-eye roast or steak. High-fat deli meats, such as salami and bologna. Caviar. Domestic duck and goose. Organ meats, such as liver. Dairy Cream, sour cream, cream cheese, and creamed cottage cheese. Whole-milk cheeses. Whole or 2% milk that is liquid, evaporated, or condensed. Whole buttermilk. Cream sauce or high-fat cheese sauce. Yogurt that is made from whole milk. Fats and oils Meat fat, or shortening. Cocoa butter, hydrogenated oils, palm oil, coconut oil, palm kernel oil. Solid fats and shortenings, including bacon fat, salt pork, lard, and butter. Nondairy cream substitutes. Salad dressings with cheese or sour cream. Beverages Regular  sodas and juice drinks with added sugar. Sweets and desserts Frosting. Pudding. Cookies. Cakes. Pies. Milk chocolate or white chocolate. Buttered syrups. Full-fat ice cream or ice cream drinks. The items listed above may not be a complete list of foods and drinks to avoid. Contact a dietitian for more information. Summary Heart-healthy meal planning includes eating less unhealthy fats, eating more healthy fats, and making other changes in your diet. Eat a balanced diet. This includes fruits and vegetables, low-fat or nonfat dairy, lean  protein, nuts and legumes, whole grains, and heart-healthy oils and fats. This information is not intended to replace advice given to you by your health care provider. Make sure you discuss any questions you have with your health care provider. Document Revised: 08/28/2021 Document Reviewed: 08/28/2021 Elsevier Patient Education  2024 [employer] Low-Sodium Eating Plan Salt (sodium) helps you keep a healthy balance of fluids in your body. Too much sodium can raise your blood pressure. It can also cause fluid and waste to be held in your body. Your health care provider or dietitian may recommend a low-sodium eating plan if you have high blood pressure (hypertension), kidney disease, liver disease, or heart failure. Eating less sodium can help lower your blood pressure and reduce swelling. It can also protect your heart, liver, and kidneys. What are tips for following this plan? Reading food labels  Check food labels for the amount of sodium per serving. If you eat more than one serving, you must multiply the listed amount by the number of servings. Choose foods with less than 140 milligrams (mg) of sodium per serving. Avoid foods with 300 mg of sodium or more per serving. Always check how much sodium is in a product, even if the label says "unsalted" or "no salt added." Shopping  Buy products labeled as "low-sodium" or "no salt added." Buy fresh foods. Avoid canned foods and pre-made or frozen meals. Avoid canned, cured, or processed meats. Buy breads that have less than 80 mg of sodium per slice. Cooking  Eat more home-cooked food. Try to eat less restaurant, buffet, and fast food. Try not to add salt when you cook. Use salt-free seasonings or herbs instead of table salt or sea salt. Check with your provider or pharmacist before using salt substitutes. Cook with plant-based oils, such as canola, sunflower, or olive oil. Meal planning When eating at a restaurant, ask if your food can be  made with less salt or no salt. Avoid dishes labeled as brined, pickled, cured, or smoked. Avoid dishes made with soy sauce, miso, or teriyaki sauce. Avoid foods that have monosodium glutamate (MSG) in them. MSG may be added to some restaurant food, sauces, soups, bouillon, and canned foods. Make meals that can be grilled, baked, poached, roasted, or steamed. These are often made with less sodium. General information Try to limit your sodium intake to 1,500-2,300 mg each day, or the amount told by your provider. What foods should I eat? Fruits Fresh, frozen, or canned fruit. Fruit juice. Vegetables Fresh or frozen vegetables. "No salt added" canned vegetables. "No salt added" tomato sauce and paste. Low-sodium or reduced-sodium tomato and vegetable juice. Grains Low-sodium cereals, such as oats, puffed wheat and rice, and shredded wheat. Low-sodium crackers. Unsalted rice. Unsalted pasta. Low-sodium bread. Whole grain breads and whole grain pasta. Meats and other proteins Fresh or frozen meat, poultry, seafood, and fish. These should have no added salt. Low-sodium canned tuna and salmon. Unsalted nuts. Dried peas, beans, and lentils without added salt. Unsalted  canned beans. Eggs. Unsalted nut butters. Dairy Milk. Soy milk. Cheese that is naturally low in sodium, such as ricotta cheese, fresh mozzarella, or Swiss cheese. Low-sodium or reduced-sodium cheese. Cream cheese. Yogurt. Seasonings and condiments Fresh and dried herbs and spices. Salt-free seasonings. Low-sodium mustard and ketchup. Sodium-free salad dressing. Sodium-free light mayonnaise. Fresh or refrigerated horseradish. Lemon juice. Vinegar. Other foods Homemade, reduced-sodium, or low-sodium soups. Unsalted popcorn and pretzels. Low-salt or salt-free chips. The items listed above may not be all the foods and drinks you can have. Talk to a dietitian to learn more. What foods should I avoid? Vegetables Sauerkraut, pickled  vegetables, and relishes. Olives. [place] fries. Onion rings. Regular canned vegetables, except low-sodium or reduced-sodium items. Regular canned tomato sauce and paste. Regular tomato and vegetable juice. Frozen vegetables in sauces. Grains Instant hot cereals. Bread stuffing, pancake, and biscuit mixes. Croutons. Seasoned rice or pasta mixes. Noodle soup cups. Boxed or frozen macaroni and cheese. Regular salted crackers. Self-rising flour. Meats and other proteins Meat or fish that is salted, canned, smoked, spiced, or pickled. Precooked or cured meat, such as sausages or meat loaves. Tomasa Blase. Ham. Pepperoni. Hot dogs. Corned beef. Chipped beef. Salt pork. Jerky. Pickled herring, anchovies, and sardines. Regular canned tuna. Salted nuts. Dairy Processed cheese and cheese spreads. Hard cheeses. Cheese curds. Blue cheese. Feta cheese. String cheese. Regular cottage cheese. Buttermilk. Canned milk. Fats and oils Salted butter. Regular margarine. Ghee. Bacon fat. Seasonings and condiments Onion salt, garlic salt, seasoned salt, table salt, and sea salt. Canned and packaged gravies. Worcestershire sauce. Tartar sauce. Barbecue sauce. Teriyaki sauce. Soy sauce, including reduced-sodium soy sauce. Steak sauce. Fish sauce. Oyster sauce. Cocktail sauce. Horseradish that you find on the shelf. Regular ketchup and mustard. Meat flavorings and tenderizers. Bouillon cubes. Hot sauce. Pre-made or packaged marinades. Pre-made or packaged taco seasonings. Relishes. Regular salad dressings. Salsa. Other foods Salted popcorn and pretzels. Corn chips and puffs. Potato and tortilla chips. Canned or dried soups. Pizza. Frozen entrees and pot pies. The items listed above may not be all the foods and drinks you should avoid. Talk to a dietitian to learn more. This information is not intended to replace advice given to you by your health care provider. Make sure you discuss any questions you have with your health care  provider. Document Revised: 08/09/2022 Document Reviewed: 08/09/2022 Elsevier Patient Education  2024 [employer].

## 2023-06-27 ENCOUNTER — Encounter (HOSPITAL_BASED_OUTPATIENT_CLINIC_OR_DEPARTMENT_OTHER): Payer: Self-pay | Admitting: Family Medicine

## 2023-08-24 ENCOUNTER — Other Ambulatory Visit: Payer: Self-pay | Admitting: Physician Assistant

## 2023-08-24 DIAGNOSIS — Z79899 Other long term (current) drug therapy: Secondary | ICD-10-CM

## 2023-08-24 DIAGNOSIS — Z951 Presence of aortocoronary bypass graft: Secondary | ICD-10-CM

## 2023-08-24 DIAGNOSIS — I25118 Atherosclerotic heart disease of native coronary artery with other forms of angina pectoris: Secondary | ICD-10-CM

## 2023-08-24 DIAGNOSIS — E785 Hyperlipidemia, unspecified: Secondary | ICD-10-CM

## 2023-09-10 DIAGNOSIS — H524 Presbyopia: Secondary | ICD-10-CM | POA: Diagnosis not present

## 2023-09-10 DIAGNOSIS — H52223 Regular astigmatism, bilateral: Secondary | ICD-10-CM | POA: Diagnosis not present

## 2023-09-19 ENCOUNTER — Encounter (HOSPITAL_BASED_OUTPATIENT_CLINIC_OR_DEPARTMENT_OTHER): Payer: Self-pay | Admitting: Family Medicine

## 2023-09-19 ENCOUNTER — Ambulatory Visit (INDEPENDENT_AMBULATORY_CARE_PROVIDER_SITE_OTHER): Payer: Medicare HMO | Admitting: Family Medicine

## 2023-09-19 VITALS — BP 145/79 | HR 73 | Ht 59.0 in | Wt 154.0 lb

## 2023-09-19 DIAGNOSIS — Z0000 Encounter for general adult medical examination without abnormal findings: Secondary | ICD-10-CM

## 2023-09-19 DIAGNOSIS — L659 Nonscarring hair loss, unspecified: Secondary | ICD-10-CM

## 2023-09-19 DIAGNOSIS — I10 Essential (primary) hypertension: Secondary | ICD-10-CM | POA: Diagnosis not present

## 2023-09-19 NOTE — Assessment & Plan Note (Signed)
Patient notes that she has had resolution of hearing loss issue.  Will still notice small amount of hearing loss, however feels that it is back to baseline at this point. Can continue with monitoring moving forward.

## 2023-09-19 NOTE — Assessment & Plan Note (Signed)
Blood pressure slightly above goal in office today. Does check blood pressure intermittently at home and reports that readings have been around 120/60.  Denies any issues with chest pain or headaches.  Continues with metoprolol, no other antihypertensives utilized presently Blood pressure remains slightly elevated on recheck.  Given prior good control, can continue with current medication regimen, no changes to be made today.  Recommend intermittent monitoring of blood pressure at home, DASH diet

## 2023-09-19 NOTE — Patient Instructions (Signed)
  Medication Instructions:  Your physician recommends that you continue on your current medications as directed. Please refer to the Current Medication list given to you today. --If you need a refill on any your medications before your next appointment, please call your pharmacy first. If no refills are authorized on file call the office.-- Lab Work: Your physician has recommended that you have lab work today: 1 week before next visit  If you have labs (blood work) drawn today and your tests are completely normal, you will receive your results via MyChart message OR a phone call from our staff.  Please ensure you check your voicemail in the event that you authorized detailed messages to be left on a delegated number. If you have any lab test that is abnormal or we need to change your treatment, we will call you to review the results.   Follow-Up: Your next appointment:   Your physician recommends that you schedule a follow-up appointment in: 6 months physical with Dr. de [place]  You will receive a text message or e-mail with a link to a survey about your care and experience with [place] today! We would greatly appreciate your feedback!   Thanks for letting us be apart of your health journey!!  Primary Care and Sports Medicine   Dr. Ceasar Mons [place]   We encourage you to activate your patient portal called "MyChart".  Sign up information is provided on this After Visit Summary.  MyChart is used to connect with patients for Virtual Visits (Telemedicine).  Patients are able to view lab/test results, encounter notes, upcoming appointments, etc.  Non-urgent messages can be sent to your provider as well. To learn more about what you can do with MyChart, please visit --  [email or web address].

## 2023-09-19 NOTE — Progress Notes (Signed)
    Procedures performed today:    None.  Independent interpretation of notes and tests performed by another provider:   None.  Brief History, Exam, Impression, and Recommendations:    BP (!) 145/79 (BP Location: Right Arm, Patient Position: Sitting, Cuff Size: Normal)   Pulse 73   Ht 4\' 11"  (1.499 m)   Wt 154 lb (69.9 kg)   SpO2 96%   BMI 31.10 kg/m   Primary hypertension Assessment & Plan: Blood pressure slightly above goal in office today. Does check blood pressure intermittently at home and reports that readings have been around 120/60.  Denies any issues with chest pain or headaches.  Continues with metoprolol, no other antihypertensives utilized presently Blood pressure remains slightly elevated on recheck.  Given prior good control, can continue with current medication regimen, no changes to be made today.  Recommend intermittent monitoring of blood pressure at home, DASH diet   Hair loss Assessment & Plan: Patient notes that she has had resolution of hearing loss issue.  Will still notice small amount of hearing loss, however feels that it is back to baseline at this point. Can continue with monitoring moving forward.   Wellness examination -     CBC with Differential/Platelet; Future -     Comprehensive metabolic panel; Future -     Hemoglobin A1c; Future -     Lipid panel; Future -     TSH Rfx on Abnormal to Free T4; Future  Return in about 6 months (around 03/18/2024) for CPE with fasting labs 1 week prior.   ___________________________________________ Raveena Hebdon de [place], MD, ABFM, CAQSM Primary Care and Sports Medicine [facility]

## 2023-10-04 ENCOUNTER — Ambulatory Visit
Admission: RE | Admit: 2023-10-04 | Discharge: 2023-10-04 | Disposition: A | Discharge status: Home / Self Care | Payer: Medicare HMO | Source: Ambulatory Visit | Attending: Family Medicine | Admitting: Family Medicine

## 2023-10-04 DIAGNOSIS — N958 Other specified menopausal and perimenopausal disorders: Secondary | ICD-10-CM | POA: Diagnosis not present

## 2023-10-04 DIAGNOSIS — E2839 Other primary ovarian failure: Secondary | ICD-10-CM | POA: Diagnosis not present

## 2023-10-04 DIAGNOSIS — Z13820 Encounter for screening for osteoporosis: Secondary | ICD-10-CM

## 2023-10-04 DIAGNOSIS — M8588 Other specified disorders of bone density and structure, other site: Secondary | ICD-10-CM | POA: Diagnosis not present

## 2023-10-28 ENCOUNTER — Other Ambulatory Visit: Payer: Self-pay

## 2023-10-28 MED ORDER — CLOPIDOGREL BISULFATE 75 MG PO TABS: 75.0000 mg | ORAL_TABLET | Freq: Every morning | ORAL | 2 refills | Status: DC

## 2023-11-21 ENCOUNTER — Other Ambulatory Visit (HOSPITAL_BASED_OUTPATIENT_CLINIC_OR_DEPARTMENT_OTHER): Payer: Self-pay | Admitting: Family Medicine

## 2023-11-21 DIAGNOSIS — Z0000 Encounter for general adult medical examination without abnormal findings: Secondary | ICD-10-CM

## 2023-11-21 DIAGNOSIS — I2510 Atherosclerotic heart disease of native coronary artery without angina pectoris: Secondary | ICD-10-CM

## 2023-11-21 DIAGNOSIS — E7801 Familial hypercholesterolemia: Secondary | ICD-10-CM

## 2023-11-21 DIAGNOSIS — M8580 Other specified disorders of bone density and structure, unspecified site: Secondary | ICD-10-CM

## 2023-12-03 ENCOUNTER — Ambulatory Visit (HOSPITAL_BASED_OUTPATIENT_CLINIC_OR_DEPARTMENT_OTHER): Payer: Medicare HMO | Admitting: *Deleted

## 2023-12-03 ENCOUNTER — Encounter (HOSPITAL_BASED_OUTPATIENT_CLINIC_OR_DEPARTMENT_OTHER): Payer: Self-pay

## 2023-12-03 DIAGNOSIS — Z0000 Encounter for general adult medical examination without abnormal findings: Secondary | ICD-10-CM | POA: Diagnosis not present

## 2023-12-03 NOTE — Progress Notes (Signed)
 Subjective:   Brenda Wilkins is a 78 y.o. female who presents for Medicare Annual (Subsequent) preventive examination.  Visit Complete: Virtual I connected with  Ane Banter on 12/03/23 by a audio enabled telemedicine application and verified that I am speaking with the correct person using two identifiers.  Patient Location: Home  Provider Location: Home Office  I discussed the limitations of evaluation and management by telemedicine. The patient expressed understanding and agreed to proceed.  Vital Signs: Because this visit was a virtual/telehealth visit, some criteria may be missing or patient reported. Any vitals not documented were not able to be obtained and vitals that have been documented are patient reported.  Patient Medicare AWV questionnaire was completed by the patient on 11-26-2023; I have confirmed that all information answered by patient is correct and no changes since this date.  Cardiac Risk Factors include: advanced age (>29men, >34 women);hypertension     Objective:    There were no vitals filed for this visit. There is no height or weight on file to calculate BMI.     12/03/2023    8:14 AM 11/27/2022   10:35 AM 08/05/2022   10:13 PM 08/05/2022    1:38 PM 10/02/2020    8:00 PM 08/08/2020    5:46 PM 07/17/2020    2:00 AM  Advanced Directives  Does Patient Have a Medical Advance Directive? Yes Yes No No No No No  Type of Estate agent of [employer] Power of [place];Living will       Copy of Healthcare Power of Attorney in Chart? No - copy requested No - copy requested       Would patient like information on creating a medical advance directive?    No - Patient declined No - Patient declined No - Patient declined No - Patient declined    Current Medications (verified) Outpatient Encounter Medications as of 12/03/2023  Medication Sig   aspirin  EC 81 MG tablet Take 81 mg by mouth in the morning.   atorvastatin  (LIPITOR ) 80 MG  tablet Take 1 tablet (80 mg total) by mouth daily.   Cholecalciferol (VITAMIN D-3 PO) Take 1 capsule by mouth in the morning.   clopidogrel  (PLAVIX ) 75 MG tablet Take 1 tablet (75 mg total) by mouth in the morning.   ezetimibe  (ZETIA ) 10 MG tablet TAKE 1 TABLET BY MOUTH EVERY DAY   metoprolol  succinate (TOPROL -XL) 25 MG 24 hr tablet Take 0.5 tablets (12.5 mg total) by mouth daily.   Multiple Vitamins-Minerals (CENTRUM ADULT PO) Take by mouth daily.   nitroGLYCERIN  (NITROSTAT ) 0.4 MG SL tablet PLACE 1 TABLET (0.4 MG TOTAL) UNDER THE TONGUE EVERY 5 (FIVE) MINUTES X 3 DOSES AS NEEDED FOR CHEST PAIN. (Patient taking differently: Place 0.4 mg under the tongue every 5 (five) minutes x 3 doses as needed for chest pain.)   TART CHERRY PO Take 1 capsule by mouth at bedtime.   No facility-administered encounter medications on file as of 12/03/2023.    Allergies (verified) Patient has no known allergies.   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   Past Surgical History:  Procedure Laterality Date   CARDIAC CATHETERIZATION  08/08/2020   CHOLECYSTECTOMY N/A 08/11/2022   Procedure: LAPAROSCOPIC CHOLECYSTECTOMY;  Surgeon: Junie Olds, MD;  Location: [facility] OR;  Service: General;  Laterality: N/A;  CORONARY ARTERY BYPASS GRAFT N/A 07/18/2020   Procedure: CORONARY ARTERY BYPASS GRAFTING (CABG), ON PUMP, TIMES THREE, USING ENDOSCOPICALLY HARVESTED RIGHT GREATER SAPHENOUS VEIN;  Surgeon: Bartley Lightning, MD;  Location: [facility] OR;  Service: Open Heart Surgery;  Laterality: N/A;   CORONARY PRESSURE/FFR STUDY N/A 10/03/2020   Procedure: INTRAVASCULAR PRESSURE WIRE/FFR STUDY;  Surgeon: Avanell Leigh, MD;  Location: [facility] INVASIVE CV LAB;  Service: Cardiovascular;  Laterality: N/A;   CORONARY STENT INTERVENTION N/A 10/03/2020   Procedure: CORONARY  STENT INTERVENTION;  Surgeon: Avanell Leigh, MD;  Location: [facility] INVASIVE CV LAB;  Service: Cardiovascular;  Laterality: N/A;   CORONARY/GRAFT ACUTE MI REVASCULARIZATION N/A 07/18/2020   Procedure: Coronary/Graft Acute MI Revascularization;  Surgeon: [place], Peter M, MD;  Location: [facility] INVASIVE CV LAB;  Service: Cardiovascular;  Laterality: N/A;   LEFT HEART CATH AND CORONARY ANGIOGRAPHY N/A 07/18/2020   Procedure: LEFT HEART CATH AND CORONARY ANGIOGRAPHY;  Surgeon: Arnoldo Lapping, MD;  Location[facility] INVASIVE CV LAB;  Service: Cardiovascular;  Laterality: N/A;   LEFT HEART CATH AND CORS/GRAFTS ANGIOGRAPHY N/A 08/08/2020   Procedure: LEFT HEART CATH AND CORS/GRAFTS ANGIOGRAPHY;  Surgeon: Millicent Ally, MD;  Location: [facility] INVASIVE CV LAB;  Service: Cardiovascular;  Laterality: N/A;   LEFT HEART CATH AND CORS/GRAFTS ANGIOGRAPHY N/A 10/03/2020   Procedure: LEFT HEART CATH AND CORS/GRAFTS ANGIOGRAPHY;  Surgeon: Avanell Leigh, MD;  Location: [facility] INVASIVE CV LAB;  Service: Cardiovascular;  Laterality: N/A;   TEE WITHOUT CARDIOVERSION N/A 07/18/2020   Procedure: TRANSESOPHAGEAL ECHOCARDIOGRAM (TEE);  Surgeon: Bartley Lightning, MD;  Location[facility] OR;  Service: Open Heart Surgery;  Laterality: N/A;   UMBILICAL HERNIA REPAIR  08/11/2022   Procedure: HERNIA REPAIR UMBILICAL ADULT;  Surgeon: Junie Olds, MD;  Location: [facility] OR;  Service: General;;   History reviewed. No pertinent family history. Social History   Socioeconomic History   Marital status: Widowed    Spouse name: Not on file   Number of children: Not on file   Years of education: 12   Highest education level: Not on file  Occupational History   Not on file  Tobacco Use   Smoking status: Never    Passive exposure: Never   Smokeless tobacco: Never  Vaping Use   Vaping status: Never Used  Substance and Sexual Activity   Alcohol use: Never   Drug use: Never   Sexual activity: Not Currently  Other Topics Concern   Not on file   Social History Narrative   Not on file   Social Drivers of Health   Financial Resource Strain: Low Risk  (12/03/2023)   Overall Financial Resource Strain (CARDIA)    Difficulty of Paying Living Expenses: Not very hard  Food Insecurity: No Food Insecurity (12/03/2023)   Hunger Vital Sign    Worried About Running Out of Food in the Last Year: Never true    Ran Out of Food in the Last Year: Never true  Transportation Needs: No Transportation Needs (12/03/2023)   PRAPARE - Administrator, Civil Service (Medical): No    Lack of Transportation (Non-Medical): No  Physical Activity: Sufficiently Active (12/03/2023)   Exercise Vital Sign    Days of Exercise per Week: 4 days    Minutes of Exercise per Session: 40 min  Stress: No Stress Concern Present (12/03/2023)   [employer] of Occupational Health - Occupational Stress Questionnaire    Feeling of Stress : Not at all  Social Connections: Moderately Integrated (12/03/2023)  Social Advertising account executive [NHANES]    Frequency of Communication with Friends and Family: More than three times a week    Frequency of Social Gatherings with Friends and Family: Twice a week    Attends Religious Services: More than 4 times per year    Active Member of [employer] or Organizations: No    Attends Engineer, structural: More than 4 times per year    Marital Status: Widowed  Recent Concern: Social Connections - Moderately Isolated (09/15/2023)   Social Connection and Isolation Panel [NHANES]    Frequency of Communication with Friends and Family: More than three times a week    Frequency of Social Gatherings with Friends and Family: Twice a week    Attends Religious Services: More than 4 times per year    Active Member of [employer] or Organizations: No    Attends Banker Meetings: Not on file    Marital Status: Widowed    Tobacco Counseling Counseling given: Not Answered   Clinical Intake:  Pre-visit preparation  completed: Yes  Pain : No/denies pain     Diabetes: No  How often do you need to have someone help you when you read instructions, pamphlets, or other written materials from your doctor or pharmacy?: 1 - Never  Interpreter Needed?: No  Information entered by :: Kieth Pelt LPN   Activities of Daily Living    12/03/2023    8:15 AM 11/26/2023   12:41 PM  In your present state of health, do you have any difficulty performing the following activities:  Hearing? 0 0  Vision? 0 0  Difficulty concentrating or making decisions? 0 0  Walking or climbing stairs? 0 0  Dressing or bathing? 0 0  Doing errands, shopping? 0 0  Preparing Food and eating ? N N  Using the Toilet? N N  In the past six months, have you accidently leaked urine? N N  Do you have problems with loss of bowel control? N N  Managing your Medications? N N  Managing your Finances? N N  Housekeeping or managing your Housekeeping? N N    Patient Care Team: [place], Alonza Jansky, MD as PCP - General (Family Medicine) Audery Blazing Deannie Fabian, MD as PCP - Cardiology (Cardiology)  Indicate any recent Medical Services you may have received from other than [facility] providers in the past year (date may be approximate).     Assessment:   This is a routine wellness examination for [place].  Hearing/Vision screen Hearing Screening - Comments:: No trouble hearing  Vision Screening - Comments:: Up to date [facility]   Goals Addressed             This Visit's Progress    DIET - INCREASE WATER INTAKE   On track    Patient Stated       Stay healthy       Depression Screen    12/03/2023    8:20 AM 09/19/2023   10:11 AM 11/27/2022   10:34 AM 10/17/2022    1:29 PM 09/27/2020   11:29 AM  PHQ 2/9 Scores  PHQ - 2 Score 0 0 0 0 0  PHQ- 9 Score 0 0     Exception Documentation    Medical reason     Fall Risk    12/03/2023    8:13 AM 11/26/2023   12:41 PM 09/19/2023   10:11 AM 11/27/2022   10:33 AM 11/26/2022    10:05 PM  Fall Risk   Falls in the past year? 0 0 0 0 0  Number falls in past yr: 0 0 0 0 0  Injury with Fall? 1 0 0 0 0  Risk for fall due to :   No Fall Risks No Fall Risks   Follow up Falls evaluation completed;Education provided;Falls prevention discussed  Falls evaluation completed Falls prevention discussed     MEDICARE RISK AT HOME: Medicare Risk at Home Any stairs in or around the home?: Yes If so, are there any without handrails?: No Home free of loose throw rugs in walkways, pet beds, electrical cords, etc?: Yes Adequate lighting in your home to reduce risk of falls?: Yes Life alert?: No Use of a cane, walker or w/c?: No Grab bars in the bathroom?: No Shower chair or bench in shower?: No Elevated toilet seat or a handicapped toilet?: No  TIMED UP AND GO:  Was the test performed?  No    Cognitive Function:        12/03/2023    8:17 AM 11/27/2022   10:36 AM  6CIT Screen  What Year? 0 points 0 points  What month? 0 points 0 points  What time? 0 points 0 points  Count back from 20 0 points 0 points  Months in reverse 0 points 0 points  Repeat phrase 0 points 0 points  Total Score 0 points 0 points    Immunizations  There is no immunization history on file for this patient.  TDAP status: Due, Education has been provided regarding the importance of this vaccine. Advised may receive this vaccine at local pharmacy or Health Dept. Aware to provide a copy of the vaccination record if obtained from local pharmacy or Health Dept. Verbalized acceptance and understanding.  Flu Vaccine status: Up to date  Pneumococcal vaccine status: Due, Education has been provided regarding the importance of this vaccine. Advised may receive this vaccine at local pharmacy or Health Dept. Aware to provide a copy of the vaccination record if obtained from local pharmacy or Health Dept. Verbalized acceptance and understanding.  Covid-19 vaccine status: Information provided on how to obtain  vaccines.   Qualifies for Shingles Vaccine? Yes   Zostavax completed No   Shingrix Completed?: No.    Education has been provided regarding the importance of this vaccine. Patient has been advised to call insurance company to determine out of pocket expense if they have not yet received this vaccine. Advised may also receive vaccine at local pharmacy or Health Dept. Verbalized acceptance and understanding.  Screening Tests Health Maintenance  Topic Date Due   COVID-19 Vaccine (1) Never done   Hepatitis C Screening  Never done   DTaP/Tdap/Td (1 - Tdap) Never done   Pneumonia Vaccine 87+ Years old (1 of 2 - PCV) Never done   Zoster Vaccines- Shingrix (1 of 2) Never done   INFLUENZA VACCINE  03/06/2024   Medicare Annual Wellness (AWV)  12/02/2024   DEXA SCAN  Completed   HPV VACCINES  Aged Out   Meningococcal B Vaccine  Aged Out    Health Maintenance  Health Maintenance Due  Topic Date Due   COVID-19 Vaccine (1) Never done   Hepatitis C Screening  Never done   DTaP/Tdap/Td (1 - Tdap) Never done   Pneumonia Vaccine 25+ Years old (1 of 2 - PCV) Never done   Zoster Vaccines- Shingrix (1 of 2) Never done    Colorectal cancer screening: No longer required.   Mammogram status: No  longer required due to age.  Bone Density status: Completed 2025. Results reflect: Bone density results: OSTEOPENIA. Repeat every 2 years.  Lung Cancer Screening: (Low Dose CT Chest recommended if Age 23-80 years, 20 pack-year currently smoking OR have quit w/in 15years.) does not qualify.   Lung Cancer Screening Referral:   Additional Screening:  Hepatitis C Screening  hep c  Vision Screening: Recommended annual ophthalmology exams for early detection of glaucoma and other disorders of the eye. Is the patient up to date with their annual eye exam?  Yes  Who is the provider or what is the name of the office in which the patient attends annual eye exams? [facility] If pt is not established with a  provider, would they like to be referred to a provider to establish care? No .   Dental Screening: Recommended annual dental exams for proper oral hygiene    Community Resource Referral / Chronic Care Management: CRR required this visit?  No   CCM required this visit?  No     Plan:     I have personally reviewed and noted the following in the patient's chart:   Medical and social history Use of alcohol, tobacco or illicit drugs  Current medications and supplements including opioid prescriptions. Patient is not currently taking opioid prescriptions. Functional ability and status Nutritional status Physical activity Advanced directives List of other physicians Hospitalizations, surgeries, and ER visits in previous 12 months Vitals Screenings to include cognitive, depression, and falls Referrals and appointments  In addition, I have reviewed and discussed with patient certain preventive protocols, quality metrics, and best practice recommendations. A written personalized care plan for preventive services as well as general preventive health recommendations were provided to patient.     Kieth Pelt, LPN   [id number]/82/[id number]   After Visit Summary: (MyChart) Due to this being a telephonic visit, the after visit summary with patients personalized plan was offered to patient via MyChart   Nurse Notes: ptien

## 2023-12-03 NOTE — Patient Instructions (Signed)
 Brenda Wilkins , Thank you for taking time to come for your Medicare Wellness Visit. I appreciate your ongoing commitment to your health goals. Please review the following plan we discussed and let me know if I can assist you in the future.   Screening recommendations/referrals: Colonoscopy: no longer required Mammogram: no longer required Bone Density: up tod ate Recommended yearly ophthalmology/optometry visit for glaucoma screening and checkup Recommended yearly dental visit for hygiene and checkup  Vaccinations: Influenza vaccine: up to date Pneumococcal vaccine: Education provided Tdap vaccine: Education provided Shingles vaccine: Education provided    Advanced directives: yes     Preventive Care 65 Years and Older, Female Preventive care refers to lifestyle choices and visits with your health care provider that can promote health and wellness. What does preventive care include? A yearly physical exam. This is also called an annual well check. Dental exams once or twice a year. Routine eye exams. Ask your health care provider how often you should have your eyes checked. Personal lifestyle choices, including: Daily care of your teeth and gums. Regular physical activity. Eating a healthy diet. Avoiding tobacco and drug use. Limiting alcohol use. Practicing safe sex. Taking low-dose aspirin  every day. Taking vitamin and mineral supplements as recommended by your health care provider. What happens during an annual well check? The services and screenings done by your health care provider during your annual well check will depend on your age, overall health, lifestyle risk factors, and family history of disease. Counseling  Your health care provider may ask you questions about your: Alcohol use. Tobacco use. Drug use. Emotional well-being. Home and relationship well-being. Sexual activity. Eating habits. History of falls. Memory and ability to understand (cognition). Work  and work Astronomer. Reproductive health. Screening  You may have the following tests or measurements: Height, weight, and BMI. Blood pressure. Lipid and cholesterol levels. These may be checked every 5 years, or more frequently if you are over 87 years old. Skin check. Lung cancer screening. You may have this screening every year starting at age 67 if you have a 30-pack-year history of smoking and currently smoke or have quit within the past 15 years. Fecal occult blood test (FOBT) of the stool. You may have this test every year starting at age 10. Flexible sigmoidoscopy or colonoscopy. You may have a sigmoidoscopy every 5 years or a colonoscopy every 10 years starting at age 64. Hepatitis C blood test. Hepatitis B blood test. Sexually transmitted disease (STD) testing. Diabetes screening. This is done by checking your blood sugar (glucose) after you have not eaten for a while (fasting). You may have this done every 1-3 years. Bone density scan. This is done to screen for osteoporosis. You may have this done starting at age 76. Mammogram. This may be done every 1-2 years. Talk to your health care provider about how often you should have regular mammograms. Talk with your health care provider about your test results, treatment options, and if necessary, the need for more tests. Vaccines  Your health care provider may recommend certain vaccines, such as: Influenza vaccine. This is recommended every year. Tetanus, diphtheria, and acellular pertussis (Tdap, Td) vaccine. You may need a Td booster every 10 years. Zoster vaccine. You may need this after age 6. Pneumococcal 13-valent conjugate (PCV13) vaccine. One dose is recommended after age 45. Pneumococcal polysaccharide (PPSV23) vaccine. One dose is recommended after age 63. Talk to your health care provider about which screenings and vaccines you need and how often you need  them. This information is not intended to replace advice given to  you by your health care provider. Make sure you discuss any questions you have with your health care provider. Document Released: 08/19/2015 Document Revised: 04/11/2016 Document Reviewed: 05/24/2015 Elsevier Interactive Patient Education  2017 [employer].  Fall Prevention in the Home Falls can cause injuries. They can happen to people of all ages. There are many things you can do to make your home safe and to help prevent falls. What can I do on the outside of my home? Regularly fix the edges of walkways and driveways and fix any cracks. Remove anything that might make you trip as you walk through a door, such as a raised step or threshold. Trim any bushes or trees on the path to your home. Use bright outdoor lighting. Clear any walking paths of anything that might make someone trip, such as rocks or tools. Regularly check to see if handrails are loose or broken. Make sure that both sides of any steps have handrails. Any raised decks and porches should have guardrails on the edges. Have any leaves, snow, or ice cleared regularly. Use sand or salt on walking paths during winter. Clean up any spills in your garage right away. This includes oil or grease spills. What can I do in the bathroom? Use night lights. Install grab bars by the toilet and in the tub and shower. Do not use towel bars as grab bars. Use non-skid mats or decals in the tub or shower. If you need to sit down in the shower, use a plastic, non-slip stool. Keep the floor dry. Clean up any water that spills on the floor as soon as it happens. Remove soap buildup in the tub or shower regularly. Attach bath mats securely with double-sided non-slip rug tape. Do not have throw rugs and other things on the floor that can make you trip. What can I do in the bedroom? Use night lights. Make sure that you have a light by your bed that is easy to reach. Do not use any sheets or blankets that are too big for your bed. They should not  hang down onto the floor. Have a firm chair that has side arms. You can use this for support while you get dressed. Do not have throw rugs and other things on the floor that can make you trip. What can I do in the kitchen? Clean up any spills right away. Avoid walking on wet floors. Keep items that you use a lot in easy-to-reach places. If you need to reach something above you, use a strong step stool that has a grab bar. Keep electrical cords out of the way. Do not use floor polish or wax that makes floors slippery. If you must use wax, use non-skid floor wax. Do not have throw rugs and other things on the floor that can make you trip. What can I do with my stairs? Do not leave any items on the stairs. Make sure that there are handrails on both sides of the stairs and use them. Fix handrails that are broken or loose. Make sure that handrails are as long as the stairways. Check any carpeting to make sure that it is firmly attached to the stairs. Fix any carpet that is loose or worn. Avoid having throw rugs at the top or bottom of the stairs. If you do have throw rugs, attach them to the floor with carpet tape. Make sure that you have a light switch at  the top of the stairs and the bottom of the stairs. If you do not have them, ask someone to add them for you. What else can I do to help prevent falls? Wear shoes that: Do not have high heels. Have rubber bottoms. Are comfortable and fit you well. Are closed at the toe. Do not wear sandals. If you use a stepladder: Make sure that it is fully opened. Do not climb a closed stepladder. Make sure that both sides of the stepladder are locked into place. Ask someone to hold it for you, if possible. Clearly mark and make sure that you can see: Any grab bars or handrails. First and last steps. Where the edge of each step is. Use tools that help you move around (mobility aids) if they are needed. These  include: Canes. Walkers. Scooters. Crutches. Turn on the lights when you go into a dark area. Replace any light bulbs as soon as they burn out. Set up your furniture so you have a clear path. Avoid moving your furniture around. If any of your floors are uneven, fix them. If there are any pets around you, be aware of where they are. Review your medicines with your doctor. Some medicines can make you feel dizzy. This can increase your chance of falling. Ask your doctor what other things that you can do to help prevent falls. This information is not intended to replace advice given to you by your health care provider. Make sure you discuss any questions you have with your health care provider. Document Released: 05/19/2009 Document Revised: 12/29/2015 Document Reviewed: 08/27/2014 Elsevier Interactive Patient Education  2017 [employer].

## 2023-12-24 NOTE — Progress Notes (Signed)
 HPI: Follow-up coronary artery disease.  Previously followed by Dr. Ardell Beauvais.  Transitioning to me.  Patient is status post coronary artery bypass and graft in 2021 (saphenous vein graft to the LAD, sequential saphenous vein graft to the diagonal and OM).  She had a follow-up cardiac catheterization January 2022 and was found to have an occluded saphenous vein graft to the LAD but the saphenous vein graft to the diagonal was patent and she was treated medically.  Echocardiogram January 2022 showed ejection fraction 50 to 55%, grade 1 diastolic dysfunction.  She again presented February 2022 with chest pain and underwent repeat cardiac catheterization revealing a 95% stenosis of the graft before the first diagonal and an occluded graft between the first diagonal and OM 2.  A drug-eluting stent was placed at the origin of the diagonal branch saphenous vein graft.  Since last seen the patient denies any dyspnea on exertion, orthopnea, PND, pedal edema, palpitations, syncope or chest pain.   Current Outpatient Medications  Medication Sig Dispense Refill   aspirin  EC 81 MG tablet Take 81 mg by mouth in the morning.     atorvastatin  (LIPITOR ) 80 MG tablet Take 1 tablet (80 mg total) by mouth daily. 90 tablet 3   Calcium  Carb-Cholecalciferol (CALCIUM +D3) 600-20 MG-MCG TABS      Cholecalciferol (VITAMIN D-3 PO) Take 1 capsule by mouth in the morning.     clopidogrel  (PLAVIX ) 75 MG tablet Take 1 tablet (75 mg total) by mouth in the morning. 90 tablet 2   ezetimibe  (ZETIA ) 10 MG tablet TAKE 1 TABLET BY MOUTH EVERY DAY 90 tablet 1   metoprolol  succinate (TOPROL -XL) 25 MG 24 hr tablet Take 0.5 tablets (12.5 mg total) by mouth daily. 45 tablet 3   Multiple Vitamins-Minerals (CENTRUM ADULT PO) Take by mouth daily.     nitroGLYCERIN  (NITROSTAT ) 0.4 MG SL tablet PLACE 1 TABLET (0.4 MG TOTAL) UNDER THE TONGUE EVERY 5 (FIVE) MINUTES X 3 DOSES AS NEEDED FOR CHEST PAIN. (Patient taking differently: Place 0.4 mg  under the tongue every 5 (five) minutes x 3 doses as needed for chest pain.) 25 tablet 5   TART CHERRY PO Take 1 capsule by mouth at bedtime.     No current facility-administered medications for this visit.     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    Past Surgical History:  Procedure Laterality Date   CARDIAC CATHETERIZATION  08/08/2020   CHOLECYSTECTOMY N/A 08/11/2022   Procedure: LAPAROSCOPIC CHOLECYSTECTOMY;  Surgeon: Junie Olds, MD;  Location: [facility] OR;  Service: General;  Laterality: N/A;   CORONARY ARTERY BYPASS GRAFT N/A 07/18/2020   Procedure: CORONARY ARTERY BYPASS GRAFTING (CABG), ON PUMP, TIMES THREE, USING ENDOSCOPICALLY HARVESTED RIGHT GREATER SAPHENOUS VEIN;  Surgeon: Bartley Lightning, MD;  Location: [facility] OR;  Service: Open Heart Surgery;  Laterality: N/A;   CORONARY PRESSURE/FFR STUDY N/A 10/03/2020   Procedure: INTRAVASCULAR PRESSURE WIRE/FFR STUDY;  Surgeon: Avanell Leigh, MD;  Location: [facility] INVASIVE CV LAB;  Service: Cardiovascular;  Laterality: N/A;   CORONARY STENT INTERVENTION N/A 10/03/2020   Procedure: CORONARY STENT INTERVENTION;  Surgeon: Avanell Leigh, MD;  [facility]: [facility];  Service: Cardiovascular;  Laterality: N/A;   CORONARY/GRAFT ACUTE MI REVASCULARIZATION N/A 07/18/2020   Procedure: Coronary/Graft Acute MI Revascularization;  Surgeon: Swaziland, Peter  M, MD;  [facility]: [facility];  Service: Cardiovascular;  Laterality: N/A;   LEFT HEART CATH AND CORONARY ANGIOGRAPHY N/A 07/18/2020   Procedure: LEFT HEART CATH AND CORONARY ANGIOGRAPHY;  Surgeon: Arnoldo Lapping, MD;  Location: [facility] INVASIVE CV LAB;  Service: Cardiovascular;  Laterality: N/A;   LEFT HEART CATH AND CORS/GRAFTS ANGIOGRAPHY N/A 08/08/2020   Procedure: LEFT HEART CATH AND CORS/GRAFTS ANGIOGRAPHY;  Surgeon: Millicent Ally, MD;  [facility]: [facility];  Service: Cardiovascular;  Laterality: N/A;   LEFT HEART CATH AND CORS/GRAFTS ANGIOGRAPHY N/A 10/03/2020   Procedure: LEFT HEART CATH AND CORS/GRAFTS ANGIOGRAPHY;  Surgeon: Avanell Leigh, MD;  [facility];  Service: Cardiovascular;  Laterality: N/A;   TEE WITHOUT CARDIOVERSION N/A 07/18/2020   Procedure: TRANSESOPHAGEAL ECHOCARDIOGRAM (TEE);  Surgeon: Bartley Lightning, MD;  Location: [facility] OR;  Service: Open Heart Surgery;  Laterality: N/A;   UMBILICAL HERNIA REPAIR  08/11/2022   Procedure: HERNIA REPAIR UMBILICAL ADULT;  Surgeon: Junie Olds, MD;  Location: [facility] OR;  Service: General;;    Social History   Socioeconomic History   Marital status: Widowed    Spouse name: Not on file   Number of children: Not on file   Years of education: 12   Highest education level: Not on file  Occupational History   Not on file  Tobacco Use   Smoking status: Never    Passive exposure: Never   Smokeless tobacco: Never  Vaping Use   Vaping status: Never Used  Substance and Sexual Activity   Alcohol use: Never   Drug use: Never   Sexual activity: Not Currently  Other Topics Concern   Not on file  Social History Narrative   Not on file   Social Drivers of Health   Financial Resource Strain: Low Risk  (12/03/2023)   Overall Financial Resource Strain (CARDIA)    Difficulty of Paying Living Expenses: Not very hard  Food Insecurity: No Food Insecurity (12/03/2023)   Hunger Vital Sign    Worried About Running Out of Food in the Last Year: Never true    Ran Out of Food in the Last Year: Never true  Transportation Needs: No Transportation Needs (12/03/2023)   PRAPARE - Administrator, Civil Service (Medical): No    Lack of Transportation (Non-Medical): No  Physical Activity: Sufficiently Active (12/03/2023)   Exercise Vital Sign    Days of Exercise per Week: 4 days    Minutes of Exercise per Session: 40 min  Stress: No Stress  Concern Present (12/03/2023)   [employer] of Occupational Health - Occupational Stress Questionnaire    Feeling of Stress : Not at all  Social Connections: Moderately Integrated (12/03/2023)   Social Connection and Isolation Panel [NHANES]    Frequency of Communication with Friends and Family: More than three times a week    Frequency of Social Gatherings with Friends and Family: Twice a week    Attends Religious Services: More than 4 times per year    Active Member of [employer] or Organizations: No    Attends Engineer, structural: More than 4 times per year    Marital Status: Widowed  Recent Concern: Social Connections - Moderately Isolated (09/15/2023)   Social Connection and Isolation Panel [NHANES]    Frequency of Communication with Friends and Family: More than three times a week    Frequency of Social Gatherings with Friends and Family: Twice a week    Attends  Religious Services: More than 4 times per year    Active Member of Clubs or Organizations: No    Attends Banker Meetings: Not on file    Marital Status: Widowed  Intimate Partner Violence: Not At Risk (12/03/2023)   Humiliation, Afraid, Rape, and Kick questionnaire    Fear of Current or Ex-Partner: No    Emotionally Abused: No    Physically Abused: No    Sexually Abused: No    History reviewed. No pertinent family history.  ROS: no fevers or chills, productive cough, hemoptysis, dysphasia, odynophagia, melena, hematochezia, dysuria, hematuria, rash, seizure activity, orthopnea, PND, pedal edema, claudication. Remaining systems are negative.  Physical Exam: Well-developed well-nourished in no acute distress.  Skin is warm and dry.  HEENT is normal.  Neck is supple.  Chest is clear to auscultation with normal expansion.  Cardiovascular exam is regular rate and rhythm.  Abdominal exam nontender or distended. No masses palpated. Extremities show no edema. neuro grossly intact  ECG- personally  reviewed  A/P  1 coronary artery disease status post coronary bypass and graft-patient denies chest pain.  Plan to continue medical therapy with aspirin , Plavix  and Lipitor .  Metoprolol  was discontinued previously due to hair loss.  2 hyperlipidemia-continue Lipitor  and Zetia .  Check lipids, liver and LP(a).  3 hypertension-blood pressure is controlled.  Continue present medications. Check bmet.  Alexandria Angel, MD

## 2024-01-06 ENCOUNTER — Ambulatory Visit: Payer: Medicare HMO | Attending: Cardiology | Admitting: Cardiology

## 2024-01-06 ENCOUNTER — Encounter: Payer: Self-pay | Admitting: Cardiology

## 2024-01-06 VITALS — BP 132/70 | HR 67 | Ht 60.0 in | Wt 155.0 lb

## 2024-01-06 DIAGNOSIS — I2510 Atherosclerotic heart disease of native coronary artery without angina pectoris: Secondary | ICD-10-CM

## 2024-01-06 DIAGNOSIS — E785 Hyperlipidemia, unspecified: Secondary | ICD-10-CM

## 2024-01-06 DIAGNOSIS — I10 Essential (primary) hypertension: Secondary | ICD-10-CM

## 2024-01-06 NOTE — Patient Instructions (Signed)
 Medication Instructions:  Your physician recommends that you continue on your current medications as directed. Please refer to the Current Medication list given to you today.    *If you need a refill on your cardiac medications before your next appointment, please call your pharmacy*   Lab Work: Please have Dr. Augustin Bloch office in include a Lipid Panel, Lipoprotein A and a Basic Metabolic Panel when you have your labs completed in office. Forward the results of that labwork to Dr. Audery Blazing as well.    If you have labs (blood work) drawn today and your tests are completely normal, you will receive your results only by: MyChart Message (if you have MyChart) OR A paper copy in the mail If you have any lab test that is abnormal or we need to change your treatment, we will call you to review the results.   Testing/Procedures: NONE    Follow-Up: At [facility], you and your health needs are our priority.  As part of our continuing mission to provide you with exceptional heart care, we have created designated Provider Care Teams.  These Care Teams include your primary Cardiologist (physician) and Advanced Practice Providers (APPs -  Physician Assistants and Nurse Practitioners) who all work together to provide you with the care you need, when you need it.  We recommend signing up for the patient portal called "MyChart".  Sign up information is provided on this After Visit Summary.  MyChart is used to connect with patients for Virtual Visits (Telemedicine).  Patients are able to view lab/test results, encounter notes, upcoming appointments, etc.  Non-urgent messages can be sent to your provider as well.   To learn more about what you can do with MyChart, go to [email or web address].    Your next appointment:   1 year(s)  The format for your next appointment:   In Person  Provider:   Alexandria Angel, MD   Other Instructions

## 2024-02-03 ENCOUNTER — Other Ambulatory Visit: Payer: Self-pay

## 2024-02-03 MED ORDER — METOPROLOL SUCCINATE ER 25 MG PO TB24
12.5000 mg | ORAL_TABLET | Freq: Every day | ORAL | 3 refills | Status: AC
Start: 2024-02-03 — End: ?

## 2024-02-18 ENCOUNTER — Other Ambulatory Visit: Payer: Self-pay

## 2024-02-18 DIAGNOSIS — Z951 Presence of aortocoronary bypass graft: Secondary | ICD-10-CM

## 2024-02-18 DIAGNOSIS — E785 Hyperlipidemia, unspecified: Secondary | ICD-10-CM

## 2024-02-18 DIAGNOSIS — I25118 Atherosclerotic heart disease of native coronary artery with other forms of angina pectoris: Secondary | ICD-10-CM

## 2024-02-18 DIAGNOSIS — Z79899 Other long term (current) drug therapy: Secondary | ICD-10-CM

## 2024-02-18 MED ORDER — EZETIMIBE 10 MG PO TABS: 10.0000 mg | ORAL_TABLET | Freq: Every day | ORAL | 3 refills | Status: AC

## 2024-03-12 ENCOUNTER — Telehealth (HOSPITAL_BASED_OUTPATIENT_CLINIC_OR_DEPARTMENT_OTHER): Payer: Self-pay | Admitting: Family Medicine

## 2024-03-12 DIAGNOSIS — E7801 Familial hypercholesterolemia: Secondary | ICD-10-CM | POA: Diagnosis not present

## 2024-03-12 DIAGNOSIS — M8580 Other specified disorders of bone density and structure, unspecified site: Secondary | ICD-10-CM | POA: Diagnosis not present

## 2024-03-12 DIAGNOSIS — I2583 Coronary atherosclerosis due to lipid rich plaque: Secondary | ICD-10-CM | POA: Diagnosis not present

## 2024-03-12 DIAGNOSIS — I2510 Atherosclerotic heart disease of native coronary artery without angina pectoris: Secondary | ICD-10-CM | POA: Diagnosis not present

## 2024-03-12 DIAGNOSIS — Z0000 Encounter for general adult medical examination without abnormal findings: Secondary | ICD-10-CM | POA: Diagnosis not present

## 2024-03-12 NOTE — Telephone Encounter (Signed)
 Nurse from cardiology came with patient to ask if she could also add a lipo protein A order to the rest of her labs as requested from her cardiologist  and release them for collection please advise

## 2024-03-13 ENCOUNTER — Ambulatory Visit: Payer: Self-pay | Admitting: Cardiology

## 2024-03-13 LAB — COMPREHENSIVE METABOLIC PANEL WITH GFR
ALT: 31 IU/L (ref 0–32)
AST: 29 IU/L (ref 0–40)
Albumin: 4.4 g/dL (ref 3.8–4.8)
Alkaline Phosphatase: 104 IU/L (ref 44–121)
BUN/Creatinine Ratio: 14 (ref 12–28)
BUN: 15 mg/dL (ref 8–27)
Bilirubin Total: 0.4 mg/dL (ref 0.0–1.2)
CO2: 21 mmol/L (ref 20–29)
Calcium: 9.4 mg/dL (ref 8.7–10.3)
Chloride: 107 mmol/L — ABNORMAL HIGH (ref 96–106)
Creatinine, Ser: 1.07 mg/dL — ABNORMAL HIGH (ref 0.57–1.00)
Globulin, Total: 2.5 g/dL (ref 1.5–4.5)
Glucose: 85 mg/dL (ref 70–99)
Potassium: 5.1 mmol/L (ref 3.5–5.2)
Sodium: 144 mmol/L (ref 134–144)
Total Protein: 6.9 g/dL (ref 6.0–8.5)
eGFR: 54 mL/min/1.73 — ABNORMAL LOW (ref >59)

## 2024-03-13 LAB — CBC WITH DIFFERENTIAL/PLATELET
Basophils Absolute: 0 x10E3/uL (ref 0.0–0.2)
Basos: 0 %
EOS (ABSOLUTE): 0.2 x10E3/uL (ref 0.0–0.4)
Eos: 3 %
Hematocrit: 45.2 % (ref 34.0–46.6)
Hemoglobin: 15 g/dL (ref 11.1–15.9)
Immature Grans (Abs): 0 x10E3/uL (ref 0.0–0.1)
Immature Granulocytes: 0 %
Lymphocytes Absolute: 2 x10E3/uL (ref 0.7–3.1)
Lymphs: 30 %
MCH: 32.4 pg (ref 26.6–33.0)
MCHC: 33.2 g/dL (ref 31.5–35.7)
MCV: 98 fL — ABNORMAL HIGH (ref 79–97)
Monocytes Absolute: 0.4 x10E3/uL (ref 0.1–0.9)
Monocytes: 6 %
Neutrophils Absolute: 4.2 x10E3/uL (ref 1.4–7.0)
Neutrophils: 61 %
Platelets: 227 x10E3/uL (ref 150–450)
RBC: 4.63 x10E6/uL (ref 3.77–5.28)
RDW: 12.2 % (ref 11.7–15.4)
WBC: 6.9 x10E3/uL (ref 3.4–10.8)

## 2024-03-13 LAB — LIPOPROTEIN A (LPA): Lipoprotein (a): 9.6 nmol/L (ref <75.0)

## 2024-03-13 LAB — LIPID PANEL
Chol/HDL Ratio: 3.3 ratio (ref 0.0–4.4)
Cholesterol, Total: 130 mg/dL (ref 100–199)
HDL: 40 mg/dL (ref >39)
LDL Chol Calc (NIH): 49 mg/dL (ref 0–99)
Triglycerides: 263 mg/dL — ABNORMAL HIGH (ref 0–149)
VLDL Cholesterol Cal: 41 mg/dL — ABNORMAL HIGH (ref 5–40)

## 2024-03-13 LAB — HEMOGLOBIN A1C
Est. average glucose Bld gHb Est-mCnc: 111 mg/dL
Hgb A1c MFr Bld: 5.5 % (ref 4.8–5.6)

## 2024-03-13 LAB — VITAMIN D 25 HYDROXY (VIT D DEFICIENCY, FRACTURES): Vit D, 25-Hydroxy: 40.3 ng/mL (ref 30.0–100.0)

## 2024-03-13 LAB — TSH RFX ON ABNORMAL TO FREE T4: TSH: 2.23 u[IU]/mL (ref 0.450–4.500)

## 2024-03-19 ENCOUNTER — Ambulatory Visit (INDEPENDENT_AMBULATORY_CARE_PROVIDER_SITE_OTHER): Payer: Medicare HMO | Admitting: Family Medicine

## 2024-03-19 VITALS — BP 129/76 | HR 67 | Temp 98.1°F | Ht 60.0 in | Wt 154.5 lb

## 2024-03-19 DIAGNOSIS — Z0000 Encounter for general adult medical examination without abnormal findings: Secondary | ICD-10-CM | POA: Diagnosis not present

## 2024-03-19 DIAGNOSIS — M8580 Other specified disorders of bone density and structure, unspecified site: Secondary | ICD-10-CM | POA: Insufficient documentation

## 2024-03-19 NOTE — Assessment & Plan Note (Signed)
 Prior bone density testing revealed mild decrease in bone density known as osteopenia.  Given observed decreased bone density, recommend assessing dietary intake of calcium .  The goal daily calcium  intake is 1200 mg.  If consuming less than this from usual dietary intake, can take over-the-counter calcium  supplement to achieve 1200 mg goal.  Also recommend taking 800 international units of vitamin D  daily.

## 2024-03-19 NOTE — Assessment & Plan Note (Signed)
 Routine HCM labs reviewed. HCM reviewed/discussed. Anticipatory guidance regarding healthy weight, lifestyle and choices given. Recommend healthy diet.  Recommend approximately 150 minutes/week of moderate intensity exercise Recommend regular dental and vision exams Always use seatbelt/lap and shoulder restraints Recommend using smoke alarms and checking batteries at least twice a year Recommend using sunscreen when outside Discussed recommendations for shingles vaccine.  Patient declines at this time Discussed immunization recommendations

## 2024-03-19 NOTE — Progress Notes (Signed)
 Subjective:    CC: Annual Physical Exam  HPI:  Brenda Wilkins is a 78 y.o. presenting for annual physical  I reviewed the past medical history, family history, social history, surgical history, and allergies today and no changes were needed.  Please see the problem list section below in epic for further details.  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   Past Surgical History: Past Surgical History:  Procedure Laterality Date   CARDIAC CATHETERIZATION  08/08/2020   CHOLECYSTECTOMY N/A 08/11/2022   Procedure: LAPAROSCOPIC CHOLECYSTECTOMY;  Surgeon: Lyndel Deward PARAS, MD;  Location: [facility] OR;  Service: General;  Laterality: N/A;   CORONARY ARTERY BYPASS GRAFT N/A 07/18/2020   Procedure: CORONARY ARTERY BYPASS GRAFTING (CABG), ON PUMP, TIMES THREE, USING ENDOSCOPICALLY HARVESTED RIGHT GREATER SAPHENOUS VEIN;  Surgeon: Lucas Dorise POUR, MD;  Location: [facility] OR;  Service: Open Heart Surgery;  Laterality: N/A;   CORONARY PRESSURE/FFR STUDY N/A 10/03/2020   Procedure: INTRAVASCULAR PRESSURE WIRE/FFR STUDY;  Surgeon: Court Dorn PARAS, MD;  Location: [facility] INVASIVE CV LAB;  Service: Cardiovascular;  Laterality: N/A;   CORONARY STENT INTERVENTION N/A 10/03/2020   Procedure: CORONARY STENT INTERVENTION;  Surgeon: Court Dorn PARAS, MD;  Location: [facility] INVASIVE CV LAB;  Service: Cardiovascular;  Laterality: N/A;   CORONARY/GRAFT ACUTE MI REVASCULARIZATION N/A 07/18/2020   Procedure: Coronary/Graft Acute MI Revascularization;  Surgeon: Swaziland, Peter M, MD;  Location: [facility] INVASIVE CV LAB;  Service: Cardiovascular;  Laterality: N/A;   LEFT HEART CATH AND CORONARY ANGIOGRAPHY N/A 07/18/2020   Procedure: LEFT HEART CATH AND CORONARY ANGIOGRAPHY;  Surgeon: Wonda Sharper, MD;  Location: [facility] INVASIVE CV LAB;  Service: Cardiovascular;   Laterality: N/A;   LEFT HEART CATH AND CORS/GRAFTS ANGIOGRAPHY N/A 08/08/2020   Procedure: LEFT HEART CATH AND CORS/GRAFTS ANGIOGRAPHY;  Surgeon: Burnard Debby LABOR, MD;  Location: [facility] INVAS[facility] CV LAB;  Service: Cardiovascular;  Laterality: N/A;   LEFT HEART CATH AND CORS/GRAFTS ANGIOGRAPHY N/A 10/03/2020   Procedure: LEFT HEART CATH AND CORS/GRAFTS ANGIOGRAPHY;  Surgeon: Court Dorn PARAS, MD;  [facility]: [facility] INVAS[facility] CV LAB;  Service: Cardiovascular;  Laterality: N/A;   TEE WITHOUT CARDIOVERSION N/A 07/18/2020   Procedure: TRANSESOPHAGEAL ECHOCARDIOGRAM (TEE);  Surgeon: Lucas Dorise POUR, MD;  Location: [facility] OR;  Service: Open Heart Surgery;  Laterality: N/A;   UMBILICAL HERNIA REPAIR  08/11/2022   Procedure: HERNIA REPAIR UMBILICAL ADULT;  Surgeon: Lyndel Deward PARAS, MD;  Location: [facility] OR;  Service: General;;   Social History: Social History   Socioeconomic History   Marital status: Widowed    Spouse name: Not on file   Number of children: Not on file   Years of education: 12   Highest education level: 12th grade  Occupational History   Not on file  Tobacco Use   Smoking status: Never    Passive exposure: Never   Smokeless tobacco: Never  Vaping Use   Vaping status: Never Used  Substance and Sexual Activity   Alcohol use: Never   Drug use: Never   Sexual activity: Not Currently  Other Topics Concern   Not on file  Social History Narrative   Not on file   Social Drivers of Health   Financial Resource Strain: Low Risk  (03/13/2024)   Overall Financial Resource Strain (CARDIA)    Difficulty of Paying Living Expenses: Not hard  at all  Food Insecurity: No Food Insecurity (03/13/2024)   Hunger Vital Sign    Worried About Running Out of Food in the Last Year: Never true    Ran Out of Food in the Last Year: Never true  Transportation Needs: No Transportation Needs (03/13/2024)   PRAPARE - Administrator, Civil Service (Medical): No    Lack of Transportation (Non-Medical): No   Physical Activity: Sufficiently Active (03/13/2024)   Exercise Vital Sign    Days of Exercise per Week: 3 days    Minutes of Exercise per Session: 60 min  Stress: No Stress Concern Present (03/13/2024)   [employer] of Occupational Health - Occupational Stress Questionnaire    Feeling of Stress: Only a little  Social Connections: Moderately Integrated (03/13/2024)   Social Connection and Isolation Panel    Frequency of Communication with Friends and Family: More than three times a week    Frequency of Social Gatherings with Friends and Family: Once a week    Attends Religious Services: More than 4 times per year    Active Member of [employer] or Organizations: Yes    Attends Banker Meetings: 1 to 4 times per year    Marital Status: Widowed   Family History: No family history on file. Allergies: No Known Allergies Medications: See med rec.  Review of Systems: No headache, visual changes, nausea, vomiting, diarrhea, constipation, dizziness, abdominal pain, skin rash, fevers, chills, night sweats, swollen lymph nodes, weight loss, chest pain, body aches, joint swelling, muscle aches, shortness of breath, mood changes, visual or auditory hallucinations.  Objective:    BP 129/76 (BP Location: Left Arm, Patient Position: Sitting, Cuff Size: Normal)   Pulse 67   Temp 98.1 F (36.7 C) (Oral)   Ht 5' (1.524 m)   Wt 154 lb 8 oz (70.1 kg)   SpO2 97%   BMI 30.17 kg/m   General: Well Developed, well nourished, and in no acute distress. Neuro: Alert and oriented x3, extra-ocular muscles intact, sensation grossly intact. Cranial nerves II through XII are intact, motor, sensory, and coordinative functions are all intact. HEENT: Normocephalic, atraumatic, pupils equal round reactive to light, neck supple, no masses, no lymphadenopathy, thyroid  nonpalpable. Oropharynx, nasopharynx, external ear canals are unremarkable. Skin: Warm and dry, no rashes noted. Cardiac: Regular rate and  rhythm, no murmurs rubs or gallops. Respiratory: Clear to auscultation bilaterally. Not using accessory muscles, speaking in full sentences. Abdominal: Soft, nontender, nondistended, positive bowel sounds, no masses, no organomegaly. Musculoskeletal: Shoulder, elbow, wrist, hip, knee, ankle stable, and with full range of motion.  Impression and Recommendations:    Wellness examination Assessment & Plan: Routine HCM labs reviewed. HCM reviewed/discussed. Anticipatory guidance regarding healthy weight, lifestyle and choices given. Recommend healthy diet.  Recommend approximately 150 minutes/week of moderate intensity exercise Recommend regular dental and vision exams Always use seatbelt/lap and shoulder restraints Recommend using smoke alarms and checking batteries at least twice a year Recommend using sunscreen when outside Discussed recommendations for shingles vaccine.  Patient declines at this time Discussed immunization recommendations   Osteopenia, unspecified location Assessment & Plan: Prior bone density testing revealed mild decrease in bone density known as osteopenia.  Given observed decreased bone density, recommend assessing dietary intake of calcium .  The goal daily calcium  intake is 1200 mg.  If consuming less than this from usual dietary intake, can take over-the-counter calcium  supplement to achieve 1200 mg goal.  Also recommend taking 800 international units of vitamin D  daily.  Return in about 1 year (around 03/19/2025) for CPE.   ___________________________________________ Cindia Hustead de [place], MD, ABFM, [facility] [place]

## 2024-05-02 ENCOUNTER — Other Ambulatory Visit: Payer: Self-pay | Admitting: Physician Assistant

## 2024-05-07 ENCOUNTER — Encounter: Payer: Self-pay | Admitting: Physician Assistant

## 2024-05-07 ENCOUNTER — Other Ambulatory Visit (HOSPITAL_COMMUNITY): Payer: Self-pay

## 2024-05-07 ENCOUNTER — Other Ambulatory Visit: Payer: Self-pay

## 2024-05-07 MED ORDER — ATORVASTATIN CALCIUM 80 MG PO TABS: 80.0000 mg | ORAL_TABLET | Freq: Every day | ORAL | 2 refills | Status: AC

## 2024-05-07 NOTE — Telephone Encounter (Signed)
 RX sent in

## 2024-07-16 NOTE — Progress Notes (Signed)
 Brenda Wilkins                                          MRN: [id number]   07/16/2024   The [facility] Quality Team Specialist reviewed this patient medical record for the purposes of chart review for care gap closure. The following were reviewed: abstraction for care gap closure-controlling blood pressure.    [facility] Quality Team

## 2024-08-02 ENCOUNTER — Other Ambulatory Visit: Payer: Self-pay | Admitting: Physician Assistant

## 2024-12-29 ENCOUNTER — Encounter (HOSPITAL_BASED_OUTPATIENT_CLINIC_OR_DEPARTMENT_OTHER)
